# Patient Record
Sex: Female | Born: 1935
Health system: Southern US, Community
[De-identification: ages and names within clinical notes are randomized; demographics above are authoritative.]

## PROBLEM LIST (undated history)

## (undated) DIAGNOSIS — IMO0002 Reserved for concepts with insufficient information to code with codable children: Secondary | ICD-10-CM

## (undated) DIAGNOSIS — Z860100 Personal history of colon polyps, unspecified: Secondary | ICD-10-CM

## (undated) DIAGNOSIS — R238 Other skin changes: Secondary | ICD-10-CM

## (undated) DIAGNOSIS — H04123 Dry eye syndrome of bilateral lacrimal glands: Secondary | ICD-10-CM

## (undated) DIAGNOSIS — G8929 Other chronic pain: Secondary | ICD-10-CM

## (undated) DIAGNOSIS — M2550 Pain in unspecified joint: Secondary | ICD-10-CM

## (undated) DIAGNOSIS — M1990 Unspecified osteoarthritis, unspecified site: Secondary | ICD-10-CM

## (undated) DIAGNOSIS — R233 Spontaneous ecchymoses: Secondary | ICD-10-CM

## (undated) DIAGNOSIS — K222 Esophageal obstruction: Secondary | ICD-10-CM

## (undated) DIAGNOSIS — M62838 Other muscle spasm: Secondary | ICD-10-CM

## (undated) DIAGNOSIS — M545 Low back pain, unspecified: Secondary | ICD-10-CM

## (undated) DIAGNOSIS — K259 Gastric ulcer, unspecified as acute or chronic, without hemorrhage or perforation: Secondary | ICD-10-CM

## (undated) DIAGNOSIS — R1110 Vomiting, unspecified: Secondary | ICD-10-CM

## (undated) DIAGNOSIS — M2540 Effusion, unspecified joint: Secondary | ICD-10-CM

## (undated) DIAGNOSIS — Z9889 Other specified postprocedural states: Secondary | ICD-10-CM

## (undated) DIAGNOSIS — M810 Age-related osteoporosis without current pathological fracture: Secondary | ICD-10-CM

## (undated) DIAGNOSIS — Z8709 Personal history of other diseases of the respiratory system: Secondary | ICD-10-CM

## (undated) DIAGNOSIS — E785 Hyperlipidemia, unspecified: Secondary | ICD-10-CM

## (undated) DIAGNOSIS — I10 Essential (primary) hypertension: Secondary | ICD-10-CM

## (undated) DIAGNOSIS — C4491 Basal cell carcinoma of skin, unspecified: Secondary | ICD-10-CM

## (undated) DIAGNOSIS — K5730 Diverticulosis of large intestine without perforation or abscess without bleeding: Secondary | ICD-10-CM

## (undated) DIAGNOSIS — R3915 Urgency of urination: Secondary | ICD-10-CM

## (undated) DIAGNOSIS — F329 Major depressive disorder, single episode, unspecified: Secondary | ICD-10-CM

## (undated) DIAGNOSIS — R112 Nausea with vomiting, unspecified: Secondary | ICD-10-CM

## (undated) DIAGNOSIS — R251 Tremor, unspecified: Secondary | ICD-10-CM

## (undated) DIAGNOSIS — M1611 Unilateral primary osteoarthritis, right hip: Secondary | ICD-10-CM

## (undated) DIAGNOSIS — F32A Depression, unspecified: Secondary | ICD-10-CM

## (undated) DIAGNOSIS — Z9289 Personal history of other medical treatment: Secondary | ICD-10-CM

## (undated) DIAGNOSIS — K219 Gastro-esophageal reflux disease without esophagitis: Secondary | ICD-10-CM

## (undated) DIAGNOSIS — K648 Other hemorrhoids: Secondary | ICD-10-CM

## (undated) DIAGNOSIS — N3281 Overactive bladder: Secondary | ICD-10-CM

## (undated) DIAGNOSIS — I00 Rheumatic fever without heart involvement: Secondary | ICD-10-CM

## (undated) DIAGNOSIS — Z8719 Personal history of other diseases of the digestive system: Secondary | ICD-10-CM

## (undated) DIAGNOSIS — G250 Essential tremor: Secondary | ICD-10-CM

## (undated) DIAGNOSIS — M791 Myalgia, unspecified site: Secondary | ICD-10-CM

## (undated) HISTORY — DX: Personal history of colon polyps, unspecified: Z86.0100

## (undated) HISTORY — DX: Personal history of colonic polyps: Z86.010

## (undated) HISTORY — DX: Unspecified osteoarthritis, unspecified site: M19.90

## (undated) HISTORY — DX: Gastro-esophageal reflux disease without esophagitis: K21.9

## (undated) HISTORY — DX: Myalgia, unspecified site: M79.10

## (undated) HISTORY — DX: Essential tremor: G25.0

## (undated) HISTORY — DX: Other hemorrhoids: K64.8

## (undated) HISTORY — DX: Hemochromatosis, unspecified: E83.119

## (undated) HISTORY — PX: SKIN CANCER EXCISION: SHX779

## (undated) HISTORY — DX: Esophageal obstruction: K22.2

## (undated) HISTORY — DX: Hyperlipidemia, unspecified: E78.5

## (undated) HISTORY — DX: Essential (primary) hypertension: I10

## (undated) HISTORY — PX: ESOPHAGOGASTRODUODENOSCOPY: SHX1529

## (undated) HISTORY — PX: SHOULDER ARTHROSCOPY W/ ROTATOR CUFF REPAIR: SHX2400

## (undated) HISTORY — DX: Diverticulosis of large intestine without perforation or abscess without bleeding: K57.30

## (undated) HISTORY — PX: COLONOSCOPY: SHX174

---
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
## 1973-10-12 HISTORY — PX: DILATION AND CURETTAGE OF UTERUS: SHX78

## 1973-10-12 HISTORY — PX: TUBAL LIGATION: SHX77

## 1975-10-13 HISTORY — PX: CHOLECYSTECTOMY: SHX55

## 1985-10-12 HISTORY — DX: Hemochromatosis, unspecified: E83.119

## 1987-10-13 HISTORY — PX: LUMBAR DISC SURGERY: SHX700

## 1999-05-13 ENCOUNTER — Encounter (INDEPENDENT_AMBULATORY_CARE_PROVIDER_SITE_OTHER): Payer: Self-pay

## 1999-05-13 ENCOUNTER — Other Ambulatory Visit: Admission: RE | Admit: 1999-05-13 | Discharge: 1999-05-13 | Payer: Self-pay | Admitting: *Deleted

## 1999-07-21 ENCOUNTER — Other Ambulatory Visit: Admission: RE | Admit: 1999-07-21 | Discharge: 1999-07-21 | Payer: Self-pay | Admitting: Family Medicine

## 1999-10-13 HISTORY — PX: CATARACT EXTRACTION W/ INTRAOCULAR LENS  IMPLANT, BILATERAL: SHX1307

## 1999-11-24 ENCOUNTER — Other Ambulatory Visit: Admission: RE | Admit: 1999-11-24 | Discharge: 1999-11-24 | Payer: Self-pay | Admitting: Family Medicine

## 2000-06-07 ENCOUNTER — Other Ambulatory Visit: Admission: RE | Admit: 2000-06-07 | Discharge: 2000-06-07 | Payer: Self-pay | Admitting: Family Medicine

## 2000-06-21 ENCOUNTER — Ambulatory Visit (HOSPITAL_COMMUNITY): Admission: RE | Admit: 2000-06-21 | Discharge: 2000-06-21 | Payer: Self-pay | Admitting: Family Medicine

## 2000-06-21 ENCOUNTER — Encounter: Payer: Self-pay | Admitting: Family Medicine

## 2000-12-03 ENCOUNTER — Inpatient Hospital Stay (HOSPITAL_COMMUNITY): Admission: EM | Admit: 2000-12-03 | Discharge: 2000-12-05 | Payer: Self-pay | Admitting: Emergency Medicine

## 2001-01-17 ENCOUNTER — Other Ambulatory Visit: Admission: RE | Admit: 2001-01-17 | Discharge: 2001-01-17 | Payer: Self-pay | Admitting: Family Medicine

## 2001-09-16 ENCOUNTER — Encounter: Payer: Self-pay | Admitting: Family Medicine

## 2001-09-16 ENCOUNTER — Ambulatory Visit (HOSPITAL_COMMUNITY): Admission: RE | Admit: 2001-09-16 | Discharge: 2001-09-16 | Payer: Self-pay | Admitting: Family Medicine

## 2002-05-15 ENCOUNTER — Other Ambulatory Visit: Admission: RE | Admit: 2002-05-15 | Discharge: 2002-05-15 | Payer: Self-pay | Admitting: Family Medicine

## 2002-09-11 ENCOUNTER — Encounter: Payer: Self-pay | Admitting: *Deleted

## 2002-09-11 ENCOUNTER — Encounter: Admission: RE | Admit: 2002-09-11 | Discharge: 2002-09-11 | Payer: Self-pay | Admitting: *Deleted

## 2003-07-31 ENCOUNTER — Ambulatory Visit (HOSPITAL_COMMUNITY): Admission: RE | Admit: 2003-07-31 | Discharge: 2003-07-31 | Payer: Self-pay | Admitting: Orthopedic Surgery

## 2003-07-31 ENCOUNTER — Encounter: Payer: Self-pay | Admitting: Orthopedic Surgery

## 2003-09-24 ENCOUNTER — Encounter: Admission: RE | Admit: 2003-09-24 | Discharge: 2003-09-24 | Payer: Self-pay | Admitting: Orthopedic Surgery

## 2003-09-25 ENCOUNTER — Ambulatory Visit (HOSPITAL_BASED_OUTPATIENT_CLINIC_OR_DEPARTMENT_OTHER): Admission: RE | Admit: 2003-09-25 | Discharge: 2003-09-25 | Payer: Self-pay | Admitting: Orthopedic Surgery

## 2003-09-25 ENCOUNTER — Ambulatory Visit (HOSPITAL_COMMUNITY): Admission: RE | Admit: 2003-09-25 | Discharge: 2003-09-25 | Payer: Self-pay | Admitting: Orthopedic Surgery

## 2004-06-10 ENCOUNTER — Other Ambulatory Visit: Admission: RE | Admit: 2004-06-10 | Discharge: 2004-06-10 | Payer: Self-pay | Admitting: Family Medicine

## 2004-09-23 ENCOUNTER — Ambulatory Visit: Payer: Self-pay | Admitting: Oncology

## 2004-11-18 ENCOUNTER — Ambulatory Visit (HOSPITAL_COMMUNITY): Admission: RE | Admit: 2004-11-18 | Discharge: 2004-11-18 | Payer: Self-pay | Admitting: Gastroenterology

## 2004-11-18 ENCOUNTER — Encounter (INDEPENDENT_AMBULATORY_CARE_PROVIDER_SITE_OTHER): Payer: Self-pay | Admitting: Specialist

## 2004-11-24 ENCOUNTER — Ambulatory Visit: Payer: Self-pay | Admitting: Oncology

## 2004-11-26 ENCOUNTER — Inpatient Hospital Stay (HOSPITAL_COMMUNITY): Admission: EM | Admit: 2004-11-26 | Discharge: 2004-11-29 | Payer: Self-pay | Admitting: Emergency Medicine

## 2005-03-20 ENCOUNTER — Ambulatory Visit: Payer: Self-pay | Admitting: Oncology

## 2005-05-22 ENCOUNTER — Ambulatory Visit: Payer: Self-pay | Admitting: Oncology

## 2005-11-23 ENCOUNTER — Ambulatory Visit: Payer: Self-pay | Admitting: Oncology

## 2006-05-20 ENCOUNTER — Ambulatory Visit: Payer: Self-pay | Admitting: Oncology

## 2006-05-24 LAB — CBC WITH DIFFERENTIAL/PLATELET
BASO%: 0.4 % (ref 0.0–2.0)
EOS%: 2.6 % (ref 0.0–7.0)
HCT: 39.6 % (ref 34.8–46.6)
LYMPH%: 39 % (ref 14.0–48.0)
MCH: 33.5 pg (ref 26.0–34.0)
MCHC: 34.8 g/dL (ref 32.0–36.0)
MCV: 96.2 fL (ref 81.0–101.0)
MONO%: 9.7 % (ref 0.0–13.0)
NEUT%: 48.3 % (ref 39.6–76.8)
Platelets: 301 10*3/uL (ref 145–400)
RBC: 4.11 10*6/uL (ref 3.70–5.32)
WBC: 7 10*3/uL (ref 3.9–10.0)

## 2006-05-24 LAB — FERRITIN: Ferritin: 118 ng/mL (ref 10–291)

## 2006-06-08 ENCOUNTER — Encounter: Admission: RE | Admit: 2006-06-08 | Discharge: 2006-06-08 | Payer: Self-pay | Admitting: Gastroenterology

## 2006-11-17 ENCOUNTER — Ambulatory Visit: Payer: Self-pay | Admitting: Oncology

## 2006-11-22 LAB — CBC WITH DIFFERENTIAL/PLATELET
BASO%: 0.4 % (ref 0.0–2.0)
EOS%: 2.2 % (ref 0.0–7.0)
HCT: 39.7 % (ref 34.8–46.6)
MCH: 33.4 pg (ref 26.0–34.0)
MCHC: 35.4 g/dL (ref 32.0–36.0)
MCV: 94.5 fL (ref 81.0–101.0)
MONO%: 5.9 % (ref 0.0–13.0)
NEUT%: 55.2 % (ref 39.6–76.8)
RDW: 12.5 % (ref 11.3–14.5)
lymph#: 2.8 10*3/uL (ref 0.9–3.3)

## 2006-11-23 LAB — COMPREHENSIVE METABOLIC PANEL
ALT: 26 U/L (ref 0–35)
AST: 35 U/L (ref 0–37)
Alkaline Phosphatase: 62 U/L (ref 39–117)
BUN: 17 mg/dL (ref 6–23)
Calcium: 9.3 mg/dL (ref 8.4–10.5)
Chloride: 104 mEq/L (ref 96–112)
Creatinine, Ser: 0.82 mg/dL (ref 0.40–1.20)

## 2007-02-18 ENCOUNTER — Ambulatory Visit: Payer: Self-pay | Admitting: Oncology

## 2007-05-19 ENCOUNTER — Ambulatory Visit: Payer: Self-pay | Admitting: Oncology

## 2007-05-23 LAB — CBC WITH DIFFERENTIAL/PLATELET
Basophils Absolute: 0 10*3/uL (ref 0.0–0.1)
Eosinophils Absolute: 0.2 10*3/uL (ref 0.0–0.5)
HCT: 38.2 % (ref 34.8–46.6)
HGB: 13.6 g/dL (ref 11.6–15.9)
LYMPH%: 33.2 % (ref 14.0–48.0)
MCV: 94.5 fL (ref 81.0–101.0)
MONO#: 0.6 10*3/uL (ref 0.1–0.9)
MONO%: 8.6 % (ref 0.0–13.0)
NEUT#: 4.1 10*3/uL (ref 1.5–6.5)
NEUT%: 55.1 % (ref 39.6–76.8)
Platelets: 273 10*3/uL (ref 145–400)
WBC: 7.4 10*3/uL (ref 3.9–10.0)

## 2007-08-25 ENCOUNTER — Ambulatory Visit: Payer: Self-pay | Admitting: Oncology

## 2007-08-30 LAB — CBC WITH DIFFERENTIAL/PLATELET
Eosinophils Absolute: 0.2 10*3/uL (ref 0.0–0.5)
HGB: 14.6 g/dL (ref 11.6–15.9)
MONO#: 0.5 10*3/uL (ref 0.1–0.9)
NEUT#: 4.5 10*3/uL (ref 1.5–6.5)
Platelets: 274 10*3/uL (ref 145–400)
RBC: 4.35 10*6/uL (ref 3.70–5.32)
RDW: 12.2 % (ref 11.3–14.5)
WBC: 7.7 10*3/uL (ref 3.9–10.0)

## 2007-08-30 LAB — FERRITIN: Ferritin: 66 ng/mL (ref 10–291)

## 2007-09-13 LAB — CBC WITH DIFFERENTIAL/PLATELET
Basophils Absolute: 0 10*3/uL (ref 0.0–0.1)
Eosinophils Absolute: 0.3 10*3/uL (ref 0.0–0.5)
HCT: 38.8 % (ref 34.8–46.6)
HGB: 13.7 g/dL (ref 11.6–15.9)
MONO#: 0.8 10*3/uL (ref 0.1–0.9)
NEUT%: 56.1 % (ref 39.6–76.8)
WBC: 8.6 10*3/uL (ref 3.9–10.0)
lymph#: 2.7 10*3/uL (ref 0.9–3.3)

## 2007-11-17 ENCOUNTER — Ambulatory Visit: Payer: Self-pay | Admitting: Oncology

## 2007-11-21 LAB — COMPREHENSIVE METABOLIC PANEL
ALT: 14 U/L (ref 0–35)
AST: 21 U/L (ref 0–37)
Albumin: 4.1 g/dL (ref 3.5–5.2)
Alkaline Phosphatase: 57 U/L (ref 39–117)
Glucose, Bld: 122 mg/dL — ABNORMAL HIGH (ref 70–99)
Potassium: 4.4 mEq/L (ref 3.5–5.3)
Sodium: 144 mEq/L (ref 135–145)
Total Protein: 6.6 g/dL (ref 6.0–8.3)

## 2007-11-21 LAB — CBC WITH DIFFERENTIAL/PLATELET
BASO%: 0.3 % (ref 0.0–2.0)
Eosinophils Absolute: 0.2 10*3/uL (ref 0.0–0.5)
MCHC: 35 g/dL (ref 32.0–36.0)
MCV: 95.4 fL (ref 81.0–101.0)
MONO%: 9.1 % (ref 0.0–13.0)
NEUT#: 4 10*3/uL (ref 1.5–6.5)
RBC: 3.95 10*6/uL (ref 3.70–5.32)
RDW: 12.2 % (ref 11.3–14.5)
WBC: 7.3 10*3/uL (ref 3.9–10.0)

## 2008-03-21 ENCOUNTER — Ambulatory Visit: Payer: Self-pay | Admitting: Oncology

## 2008-05-03 ENCOUNTER — Ambulatory Visit: Payer: Self-pay | Admitting: Oncology

## 2008-05-07 LAB — FERRITIN: Ferritin: 101 ng/mL (ref 10–291)

## 2008-06-13 ENCOUNTER — Ambulatory Visit: Payer: Self-pay | Admitting: Oncology

## 2008-06-19 LAB — FERRITIN: Ferritin: 92 ng/mL (ref 10–291)

## 2008-08-02 ENCOUNTER — Ambulatory Visit: Payer: Self-pay | Admitting: Oncology

## 2008-08-06 LAB — FERRITIN: Ferritin: 44 ng/mL (ref 10–291)

## 2008-11-08 ENCOUNTER — Ambulatory Visit: Payer: Self-pay | Admitting: Oncology

## 2008-11-12 LAB — CBC WITH DIFFERENTIAL/PLATELET
BASO%: 0.3 % (ref 0.0–2.0)
Eosinophils Absolute: 0.2 10*3/uL (ref 0.0–0.5)
LYMPH%: 29.8 % (ref 14.0–48.0)
MCHC: 34.7 g/dL (ref 32.0–36.0)
MCV: 96 fL (ref 81.0–101.0)
MONO%: 6.9 % (ref 0.0–13.0)
NEUT#: 5.3 10*3/uL (ref 1.5–6.5)
RBC: 4.09 10*6/uL (ref 3.70–5.32)
RDW: 12.7 % (ref 11.3–14.5)
WBC: 8.6 10*3/uL (ref 3.9–10.0)

## 2008-11-12 LAB — COMPREHENSIVE METABOLIC PANEL
ALT: 11 U/L (ref 0–35)
AST: 16 U/L (ref 0–37)
Albumin: 4.4 g/dL (ref 3.5–5.2)
Alkaline Phosphatase: 60 U/L (ref 39–117)
Glucose, Bld: 112 mg/dL — ABNORMAL HIGH (ref 70–99)
Potassium: 4 mEq/L (ref 3.5–5.3)
Sodium: 141 mEq/L (ref 135–145)
Total Bilirubin: 0.5 mg/dL (ref 0.3–1.2)
Total Protein: 6.6 g/dL (ref 6.0–8.3)

## 2008-12-03 LAB — FERRITIN: Ferritin: 61 ng/mL (ref 10–291)

## 2008-12-03 LAB — CBC WITH DIFFERENTIAL/PLATELET
BASO%: 0.1 % (ref 0.0–2.0)
EOS%: 1.7 % (ref 0.0–7.0)
HCT: 38.6 % (ref 34.8–46.6)
LYMPH%: 29.7 % (ref 14.0–49.7)
MCH: 33.8 pg (ref 25.1–34.0)
MCHC: 34.5 g/dL (ref 31.5–36.0)
MCV: 98 fL (ref 79.5–101.0)
MONO#: 0.7 10*3/uL (ref 0.1–0.9)
MONO%: 8.9 % (ref 0.0–14.0)
NEUT%: 59.6 % (ref 38.4–76.8)
Platelets: 293 10*3/uL (ref 145–400)
RBC: 3.94 10*6/uL (ref 3.70–5.45)
WBC: 7.9 10*3/uL (ref 3.9–10.3)

## 2008-12-03 LAB — IRON AND TIBC: TIBC: 289 ug/dL (ref 250–470)

## 2009-01-31 ENCOUNTER — Ambulatory Visit: Payer: Self-pay | Admitting: Oncology

## 2009-04-04 ENCOUNTER — Ambulatory Visit: Payer: Self-pay | Admitting: Oncology

## 2009-04-08 LAB — FERRITIN: Ferritin: 51 ng/mL (ref 10–291)

## 2009-06-06 ENCOUNTER — Ambulatory Visit: Payer: Self-pay | Admitting: Oncology

## 2009-06-10 LAB — FERRITIN: Ferritin: 47 ng/mL (ref 10–291)

## 2009-08-01 ENCOUNTER — Ambulatory Visit: Payer: Self-pay | Admitting: Oncology

## 2009-08-05 LAB — FERRITIN: Ferritin: 68 ng/mL (ref 10–291)

## 2009-10-02 ENCOUNTER — Ambulatory Visit: Payer: Self-pay | Admitting: Oncology

## 2009-10-29 DIAGNOSIS — G250 Essential tremor: Secondary | ICD-10-CM

## 2009-10-29 HISTORY — DX: Essential tremor: G25.0

## 2009-11-18 ENCOUNTER — Encounter: Admission: RE | Admit: 2009-11-18 | Discharge: 2009-11-18 | Payer: Self-pay | Admitting: Gastroenterology

## 2009-11-20 DIAGNOSIS — K648 Other hemorrhoids: Secondary | ICD-10-CM

## 2009-11-20 DIAGNOSIS — K5730 Diverticulosis of large intestine without perforation or abscess without bleeding: Secondary | ICD-10-CM

## 2009-11-20 HISTORY — DX: Other hemorrhoids: K64.8

## 2009-11-20 HISTORY — DX: Diverticulosis of large intestine without perforation or abscess without bleeding: K57.30

## 2009-11-20 LAB — HM COLONOSCOPY

## 2009-11-21 ENCOUNTER — Ambulatory Visit: Payer: Self-pay | Admitting: Oncology

## 2009-11-25 LAB — CBC WITH DIFFERENTIAL/PLATELET
BASO%: 0.3 % (ref 0.0–2.0)
Basophils Absolute: 0 10*3/uL (ref 0.0–0.1)
HCT: 38.7 % (ref 34.8–46.6)
LYMPH%: 44.9 % (ref 14.0–49.7)
MCHC: 34.2 g/dL (ref 31.5–36.0)
MONO#: 0.5 10*3/uL (ref 0.1–0.9)
NEUT%: 44.2 % (ref 38.4–76.8)
Platelets: 282 10*3/uL (ref 145–400)
WBC: 6.6 10*3/uL (ref 3.9–10.3)

## 2009-11-25 LAB — FERRITIN: Ferritin: 96 ng/mL (ref 10–291)

## 2009-11-25 LAB — IRON AND TIBC: %SAT: 46 % (ref 20–55)

## 2009-12-16 LAB — CBC WITH DIFFERENTIAL/PLATELET
Basophils Absolute: 0 10*3/uL (ref 0.0–0.1)
Eosinophils Absolute: 0.2 10*3/uL (ref 0.0–0.5)
HGB: 12.2 g/dL (ref 11.6–15.9)
LYMPH%: 35.7 % (ref 14.0–49.7)
MCV: 100 fL (ref 79.5–101.0)
MONO%: 8.8 % (ref 0.0–14.0)
NEUT#: 3.6 10*3/uL (ref 1.5–6.5)
Platelets: 289 10*3/uL (ref 145–400)
RDW: 12.9 % (ref 11.2–14.5)

## 2010-02-21 ENCOUNTER — Ambulatory Visit: Payer: Self-pay | Admitting: Oncology

## 2010-02-24 LAB — CBC WITH DIFFERENTIAL/PLATELET
Basophils Absolute: 0 10*3/uL (ref 0.0–0.1)
Eosinophils Absolute: 0.1 10*3/uL (ref 0.0–0.5)
HCT: 38.7 % (ref 34.8–46.6)
HGB: 13.4 g/dL (ref 11.6–15.9)
MONO#: 0.6 10*3/uL (ref 0.1–0.9)
NEUT%: 52 % (ref 38.4–76.8)
WBC: 6.1 10*3/uL (ref 3.9–10.3)
lymph#: 2.3 10*3/uL (ref 0.9–3.3)

## 2010-04-24 ENCOUNTER — Ambulatory Visit: Payer: Self-pay | Admitting: Oncology

## 2010-04-28 LAB — CBC WITH DIFFERENTIAL/PLATELET
Eosinophils Absolute: 0.2 10*3/uL (ref 0.0–0.5)
HGB: 13.9 g/dL (ref 11.6–15.9)
LYMPH%: 30.2 % (ref 14.0–49.7)
MONO#: 0.7 10*3/uL (ref 0.1–0.9)
NEUT#: 4.3 10*3/uL (ref 1.5–6.5)
Platelets: 288 10*3/uL (ref 145–400)
RBC: 4.1 10*6/uL (ref 3.70–5.45)
RDW: 12.5 % (ref 11.2–14.5)
WBC: 7.4 10*3/uL (ref 3.9–10.3)

## 2010-04-28 LAB — FERRITIN: Ferritin: 59 ng/mL (ref 10–291)

## 2010-06-26 ENCOUNTER — Ambulatory Visit: Payer: Self-pay | Admitting: Oncology

## 2010-08-28 ENCOUNTER — Ambulatory Visit: Payer: Self-pay | Admitting: Oncology

## 2010-09-01 LAB — CBC WITH DIFFERENTIAL/PLATELET
Basophils Absolute: 0 10*3/uL (ref 0.0–0.1)
Eosinophils Absolute: 0.1 10*3/uL (ref 0.0–0.5)
HGB: 13 g/dL (ref 11.6–15.9)
MCV: 95.8 fL (ref 79.5–101.0)
MONO#: 0.6 10*3/uL (ref 0.1–0.9)
NEUT#: 4.3 10*3/uL (ref 1.5–6.5)
RDW: 12.8 % (ref 11.2–14.5)
WBC: 7.7 10*3/uL (ref 3.9–10.3)
lymph#: 2.6 10*3/uL (ref 0.9–3.3)

## 2010-09-01 LAB — FERRITIN: Ferritin: 22 ng/mL (ref 10–291)

## 2010-11-25 ENCOUNTER — Other Ambulatory Visit: Payer: Self-pay | Admitting: Oncology

## 2010-11-25 ENCOUNTER — Encounter (HOSPITAL_BASED_OUTPATIENT_CLINIC_OR_DEPARTMENT_OTHER): Payer: Medicare Other | Admitting: Oncology

## 2010-11-25 LAB — CBC WITH DIFFERENTIAL/PLATELET
Basophils Absolute: 0 10*3/uL (ref 0.0–0.1)
EOS%: 1.7 % (ref 0.0–7.0)
HGB: 12.8 g/dL (ref 11.6–15.9)
MCH: 32.7 pg (ref 25.1–34.0)
MONO#: 0.7 10*3/uL (ref 0.1–0.9)
NEUT#: 4.7 10*3/uL (ref 1.5–6.5)
RDW: 12.4 % (ref 11.2–14.5)
WBC: 8 10*3/uL (ref 3.9–10.3)
lymph#: 2.4 10*3/uL (ref 0.9–3.3)

## 2010-12-02 ENCOUNTER — Encounter (HOSPITAL_BASED_OUTPATIENT_CLINIC_OR_DEPARTMENT_OTHER): Payer: Medicare Other | Admitting: Oncology

## 2011-02-27 NOTE — H&P (Signed)
NAMECARRIEANNE, Denise Lane            ACCOUNT NO.:  [id number]   MEDICAL RECORD NO.:  [id number]          PATIENT TYPE:  INP   LOCATION:  6733                         FACILITY:  [facility]   PHYSICIAN:  Graylin Shiver, M.D.   DATE OF BIRTH:  May 03, 1930   DATE OF ADMISSION:  11/26/2004  DATE OF DISCHARGE:                                HISTORY & PHYSICAL   CHIEF COMPLAINT:  Rectal bleeding.   HISTORY OF PRESENT ILLNESS:  This patient is a 77 year old white female who  had a colonoscopy with polypectomy on November 18, 2004 with removal of a  small 5 mm sessile polyp from the ascending colon.  She was also found to  have diverticulosis.  She had been doing well until this morning at around 6  a.m. when she experienced bloody stool.  She states that she took some senna  laxative last night before going to bed, and then  had the bloody stool this  morning.  She was previously having normal bowel movements.  She came to the  emergency room where she was seen.  A hemoglobin and hematocrit were  obtained, and were 11.5 and 33 respectively.   PAST HISTORY:   MEDICAL PROBLEMS:  Hemachromatosis.   SURGERIES:  1.  Tonsillectomy.  2.  Appendectomy.  3.  Laparoscopy.  4.  Cholecystectomy.  5.  Back surgery.  6.  Cataract surgery.  7.  Rotator cuff and shoulder surgery.  8.  History of a Mallory-Weiss tear in the past found at endoscopy several      years ago.   MEDICATIONS:  1.  Fosamax.  2.  Prilosec.  3.  Multivitamin.  4.  Fish oil.  5.  Magnesium with zinc.  6.  B complex.  7.  Folic acid.  8.  Glucosamine.  9.  MiraLax.   ALLERGIES:  1.  MORPHINE.  2.  DILAUDID.  3.  CODEINE.  4.  SULFA.   FAMILY HISTORY:  Negative for colon cancer or colon polyps.   SOCIAL HISTORY:  She does not smoke or drink alcohol.   SYSTEMS REVIEW:  No complaints of fever or chills.  No chest pain.  No  shortness of breath, cough, or sputum production.  No abdominal pain.  Further GI history as  above.   PHYSICAL EXAMINATION:  GENERAL:  She does not appear in any acute distress  at this time.  VITAL SIGNS:  Blood pressure 106/53, pulse 79.  HEENT:  Negative.  NECK:  Supple.  No masses, adenopathy, or goiter.  HEART:  Regular rhythm.  No murmurs, gallops, or rubs.  LUNGS:  Clear.  ABDOMEN:  Bowel sounds are normal, soft, nontender.  No hepatosplenomegaly.  EXTREMITIES:  No edema.   IMPRESSION:  Lower gastrointestinal bleeding.  This could be a post-  polypectomy bleed, or it could be bleeding from diverticulosis.   PLAN:  Admit to the hospital for IV hydration, serial hemoglobin and  hematocrits, transfusion as needed.  A nuclear medicine GI bleeding scan  will be obtained.      SFG/MEDQ  D:  11/26/2004  T:  11/26/2004  Job:  [id number] 

## 2011-02-27 NOTE — Discharge Summary (Signed)
Denise Lane, Denise Lane            ACCOUNT NO.:  [id number]   MEDICAL RECORD NO.:  [id number]          PATIENT TYPE:  INP   LOCATION:  6733                         FACILITY:  [facility]   PHYSICIAN:  Graylin Shiver, M.D.   DATE OF BIRTH:  08-24-1930   DATE OF ADMISSION:  11/26/2004  DATE OF DISCHARGE:  11/29/2004                                 DISCHARGE SUMMARY   REASON FOR ADMISSION:  The patient is a 77 year old female who had a  colonoscopy with polypectomy on November 18, 2004 with removal of a 5 mm  sessile polyp from the ascending colon.  She was also found to have  diverticulosis.  She was doing well until the morning of admission when she  began to experience bloody stools.  She was admitted to the hospital because  of the lower gastrointestinal bleeding.  It was felt that this could be a  postpolypectomy bleed or bleeding from diverticulosis.   PHYSICAL EXAMINATION:  VITAL SIGNS:  On physical, her blood pressure was  106/53, pulse 79.  GENERAL:  She did not appear in any acute distress.  HEART:  Regular rhythm.  No murmurs.  LUNGS:  Clear.  ABDOMEN:  Soft, nontender.   HOSPITAL COURSE:  The patient's admitting hemoglobin was 11.5.  The  following day, it did drop down to 7.9.  She was transfused two units of  blood.  A gastrointestinal bleeding scan was obtained and was negative.  She  was followed clinically.  There was no further evidence of active bleeding.  She was advanced to a regular diet and discharged on November 29, 2004.   FINAL DIAGNOSIS:  1.  Lower gastrointestinal bleeding.  2.  Diverticulosis.  3.  Status post polypectomy.   PLAN:  Regular diet.  Follow up in the office post discharge with checking  of hemoglobin and hematocrit in a couple of weeks.      SFG/MEDQ  D:  02/04/2005  T:  02/04/2005  Job:  [id number]

## 2011-02-27 NOTE — Op Note (Signed)
NAME:  Denise Lane, Denise Lane            ACCOUNT NO.:  [id number]   MEDICAL RECORD NO.:  [id number]          PATIENT TYPE:  AMB   LOCATION:  ENDO                         FACILITY:  [facility]   PHYSICIAN:  Graylin Shiver, M.D.   DATE OF BIRTH:  1930/05/12   DATE OF PROCEDURE:  11/18/2004  DATE OF DISCHARGE:                                 OPERATIVE REPORT   PROCEDURE PERFORMED:  Colonoscopy with polypectomy.   INDICATIONS:  Change in bowel habits.   Informed consent was obtained after explanation of the risks of bleeding,  infection and perforation.   PREMEDICATION:  Fentanyl 60 mcg IV, Versed 6 milligrams IV.   PROCEDURE:  With the patient in the left lateral decubitus position, a  rectal exam was performed. No masses were felt. The Olympus colonoscope was  inserted into the rectum and advanced around the colon to the cecum. Cecal  landmarks were identified. The cecum looked normal.  In the descending colon  there was a 5 mm sessile polyp snared with a mini snare and removed by snare  cautery technique.  The cautery site looked good.  The polyp was retrieved  in fragments as it was suctioned through the scope.  The transverse colon  looked normal. The descending colon looked normal. The sigmoid showed a few  diverticula.  The rectum was normal. She tolerated the procedure well  without complications.   IMPRESSION:  1.  A small ascending colon polyp.  2.  Diverticulosis.   PLAN:  The pathology will be checked.      SFG/MEDQ  D:  11/18/2004  T:  11/18/2004  Job:  [id number]   cc:   Dario Guardian, M.D.  [address]  [place]  [place] [address]  Fax: [phone]

## 2011-02-27 NOTE — Procedures (Signed)
[place]  Patient:    Denise Lane, Denise Lane                   MRN: [id number] Proc. Date: 12/03/00 Adm. Date:  [id number] Disc. Date: [id number] Attending:  Selina Cooley CC:         Selina Cooley, M.D.  Willis Modena. Dreiling, M.D.   Procedure Report  PROCEDURE PERFORMED:  Esophagogastroduodenoscopy.  ENDOSCOPIST:  Everardo All. Madilyn Fireman, M.D.  INDICATIONS FOR PROCEDURE:  Upper gastrointestinal bleeding.  DESCRIPTION OF PROCEDURE:  The patient was placed in the left lateral decubitus position and placed on the pulse monitor with continuous low flow oxygen delivered by nasal cannula.  She was sedated with 30 mg IV Demerol and 4 mg IV Versed.  The Olympus video endoscope was advanced under direct vision into the oropharynx and esophagus.  The esophagus was straight and normal caliber at the squamocolumnar line at 38 cm and initially the gastroesophageal junction area appeared normal, but on careful inspection, there appeared to be a slightly protuberant visible vessel possibly associated with a Mallory-Weiss tear at the squamocolumnar line.  It was somewhat difficult to visualize clearly though was not bleeding.  There was a small hiatal hernia distal to it.  The stomach was entered and a small amount of liquid secretions was suctioned from the fundus.  Retroflex view of the cardia was unremarkable. The fundus, body, antrum and pylorus all appeared normal.  The duodenum was entered and both the bulb and second portion were well inspected and appeared to be within normal limits.  The endoscope was withdrawn and the patient returned to the recovery room in stable condition.  The patient tolerated the procedure well.  There were no immediate complications.  IMPRESSION:  Presumed Mallory-Weiss tear currently not bleeding.  No other sources of gastrointestinal bleeding seen.  PLAN:  Will treat with Carafate elixir and proton pump inhibitor.  Will monitor for  further bleeding and advance diet gradually. DD:  12/04/00 TD:  12/06/00 Job: [id number]

## 2011-05-26 ENCOUNTER — Other Ambulatory Visit: Payer: Self-pay | Admitting: Dermatology

## 2011-06-02 ENCOUNTER — Other Ambulatory Visit: Payer: Self-pay | Admitting: Oncology

## 2011-06-02 ENCOUNTER — Encounter (HOSPITAL_BASED_OUTPATIENT_CLINIC_OR_DEPARTMENT_OTHER): Payer: Medicare Other | Admitting: Oncology

## 2011-06-02 LAB — CBC WITH DIFFERENTIAL/PLATELET
Eosinophils Absolute: 0.2 10*3/uL (ref 0.0–0.5)
HCT: 38.7 % (ref 34.8–46.6)
HGB: 13.5 g/dL (ref 11.6–15.9)
LYMPH%: 33 % (ref 14.0–49.7)
MONO#: 0.7 10*3/uL (ref 0.1–0.9)
NEUT#: 4 10*3/uL (ref 1.5–6.5)
NEUT%: 55.1 % (ref 38.4–76.8)
Platelets: 253 10*3/uL (ref 145–400)
WBC: 7.3 10*3/uL (ref 3.9–10.3)
lymph#: 2.4 10*3/uL (ref 0.9–3.3)

## 2011-06-02 LAB — FERRITIN: Ferritin: 49 ng/mL (ref 10–291)

## 2011-10-24 ENCOUNTER — Telehealth: Payer: Self-pay | Admitting: Oncology

## 2011-10-24 NOTE — Telephone Encounter (Signed)
S/w the pt and she is aware of her feb 2013 appts °

## 2011-11-09 DIAGNOSIS — Z1231 Encounter for screening mammogram for malignant neoplasm of breast: Secondary | ICD-10-CM | POA: Diagnosis not present

## 2011-11-11 ENCOUNTER — Other Ambulatory Visit: Payer: Medicare Other | Admitting: Lab

## 2011-11-17 ENCOUNTER — Other Ambulatory Visit: Payer: Medicare Other

## 2011-11-17 LAB — CBC WITH DIFFERENTIAL/PLATELET
Basophils Absolute: 0 10*3/uL (ref 0.0–0.1)
EOS%: 2.5 % (ref 0.0–7.0)
Eosinophils Absolute: 0.2 10*3/uL (ref 0.0–0.5)
LYMPH%: 35.7 % (ref 14.0–49.7)
MCH: 33.5 pg (ref 25.1–34.0)
MCV: 98.5 fL (ref 79.5–101.0)
MONO%: 8 % (ref 0.0–14.0)
NEUT#: 4 10*3/uL (ref 1.5–6.5)
Platelets: 269 10*3/uL (ref 145–400)
RBC: 4.01 10*6/uL (ref 3.70–5.45)

## 2011-11-17 LAB — COMPREHENSIVE METABOLIC PANEL
Alkaline Phosphatase: 69 U/L (ref 39–117)
BUN: 24 mg/dL — ABNORMAL HIGH (ref 6–23)
Glucose, Bld: 130 mg/dL — ABNORMAL HIGH (ref 70–99)
Sodium: 140 mEq/L (ref 135–145)
Total Bilirubin: 0.3 mg/dL (ref 0.3–1.2)

## 2011-11-18 ENCOUNTER — Other Ambulatory Visit: Payer: Self-pay | Admitting: *Deleted

## 2011-11-18 ENCOUNTER — Telehealth: Payer: Self-pay

## 2011-11-18 NOTE — Telephone Encounter (Signed)
SPOKE WITH MS. [name] AND TOLD HER THAT HER FERRITIN LEVEL FROM 11-17-11 WAS 57.  SHE WILL HAVE A PHLEBOTOMY AFTER HER VISIT ON 11-23-11.  PT. VERBALIZED UNDERSTANDING.

## 2011-11-20 ENCOUNTER — Ambulatory Visit: Payer: Medicare Other | Admitting: Oncology

## 2011-11-21 ENCOUNTER — Other Ambulatory Visit: Payer: Self-pay | Admitting: Oncology

## 2011-11-23 ENCOUNTER — Telehealth: Payer: Self-pay | Admitting: Oncology

## 2011-11-23 ENCOUNTER — Ambulatory Visit (HOSPITAL_BASED_OUTPATIENT_CLINIC_OR_DEPARTMENT_OTHER): Payer: Medicare Other | Admitting: Oncology

## 2011-11-23 NOTE — Patient Instructions (Signed)
Drink lots of fluids while sinus congestion and before phlebotomy.

## 2011-11-23 NOTE — Progress Notes (Signed)
OFFICE PROGRESS NOTE Date of Visit 11-23-11 Physicians: A.Green, J.Edwards  INTERVAL HISTORY:   Patient is seen, alone for visit today, in yearly follow up of her hemachromatosis, which we treat with prn phlebotomy to keep ferritin <=50. Most recent ferritin was 57 on 11-17-11 and she was scheduled for phlebotomy with visit today, however she has sinusitis vs viral URI and we will delay phlebotomy for a couple of weeks until she has resolved this.  Patient has been generally well until this past weekend, when she developed sinusitis/ URI symptoms after her brother's funeral. She has had sinus congestion and drainage, no fever, no lower respiratory symptoms, some discomfort in right ear.  Review of Systems otherwise: no bleeding or symptoms of blood clots. 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 No other recent infectious illness. No abdominal pain, good energy, no GI symptoms. Remainder of 10 point ROS negative. Objective:  Vital signs in last 24 hours:  BP 164/75  Pulse 84  Temp(Src) 97.3 F (36.3 C) (Oral)  Ht 5\' 6"  (1.676 m)  Wt 125 lb 1.6 oz (56.745 kg)  BMI 20.19 kg/m2 Alert, NAD, sounds congested.    HEENT:mucous membranes moist, pharynx normal without lesions. Nasal turbinates boggy. Right TM dull, left less so. LymphaticsCervical, supraclavicular, and axillary nodes normal. Resp: clear to auscultation bilaterally and normal percussion bilaterally Cardio: slightly irregular rate and rhythmn GI: soft, non-tender; bowel sounds normal; no masses,  no organomegaly Extremities: extremities normal, atraumatic, no cyanosis or edema. Some arthritic changes fingers bilaterally    Lab Results: from [facility] 08-03-11 WBC 7.5, Hgb13.4, MCV 97, plt 269, ANC 4.7, differential not remarkable  and BMET 08-03-11 Na 141,K 4.6, Cl 106, CO2 26, BUN 22, creat 0.7   Studies/Results:  No results found.  Medications: I have reviewed the  patient's current medications.  Assessment/Plan:  1.hemachromatosis: phlebotomize to keep ferritin < 50. Will do phlebotomy early March, check ferritin in ~ Sept 2013 and I will see her after labs ~ March 2014. 2.viral URI/sinusitis: push fluids, can use mucinex, call if symptoms worse        LIVESAY,LENNIS P, MD   11/23/2011, 9:07 PM

## 2011-11-23 NOTE — Telephone Encounter (Signed)
Talked to pt, she is aware of appt on 2/26 and 12/15/11

## 2011-11-30 NOTE — Progress Notes (Signed)
GAVE MELISSA DALE POF DONE FOR MS. Wulf  ON 11-23-11 AND TOLD HER THAT THERE WERE INCORRECT DATES SCHEDULED FOR PATIENT.  MELISSA STATED THAT SHE WILL FIX SCHEDULE.  THE COMMENTS FOR THE ORDERS DONE BY DR. Darrold Span ARE FINE. IT WAS A SCHEDULING ERROR.

## 2011-12-02 ENCOUNTER — Telehealth: Payer: Self-pay | Admitting: Oncology

## 2011-12-02 NOTE — Telephone Encounter (Signed)
cx'd appts for 2/26 lb and 3/5 f/u w/LL. appts made in error per LL (louise) these are not needed. Keep appts lb appt for sept 2013 and lb/fu for march 2014 and add phelb for 12/20/2012. Could not schedule phleb at this time because template has not been release. Message sent to SV to release template. S/w pt today she is aware of above changes and will get new schedule when she comes in for phleb 12/15/2011.

## 2011-12-07 ENCOUNTER — Encounter: Payer: Self-pay | Admitting: Oncology

## 2011-12-07 DIAGNOSIS — E785 Hyperlipidemia, unspecified: Secondary | ICD-10-CM | POA: Diagnosis not present

## 2011-12-07 DIAGNOSIS — I10 Essential (primary) hypertension: Secondary | ICD-10-CM | POA: Diagnosis not present

## 2011-12-07 DIAGNOSIS — R7989 Other specified abnormal findings of blood chemistry: Secondary | ICD-10-CM | POA: Diagnosis not present

## 2011-12-08 ENCOUNTER — Other Ambulatory Visit: Payer: Medicare Other | Admitting: Lab

## 2011-12-09 DIAGNOSIS — G252 Other specified forms of tremor: Secondary | ICD-10-CM | POA: Diagnosis not present

## 2011-12-09 DIAGNOSIS — R32 Unspecified urinary incontinence: Secondary | ICD-10-CM | POA: Diagnosis not present

## 2011-12-09 DIAGNOSIS — I10 Essential (primary) hypertension: Secondary | ICD-10-CM | POA: Diagnosis not present

## 2011-12-09 DIAGNOSIS — M1990 Unspecified osteoarthritis, unspecified site: Secondary | ICD-10-CM | POA: Diagnosis not present

## 2011-12-09 DIAGNOSIS — G250 Essential tremor: Secondary | ICD-10-CM | POA: Diagnosis not present

## 2011-12-15 ENCOUNTER — Ambulatory Visit: Payer: Medicare Other | Admitting: Oncology

## 2011-12-15 ENCOUNTER — Ambulatory Visit (HOSPITAL_BASED_OUTPATIENT_CLINIC_OR_DEPARTMENT_OTHER): Payer: Medicare Other

## 2011-12-15 DIAGNOSIS — R42 Dizziness and giddiness: Secondary | ICD-10-CM

## 2011-12-15 MED ORDER — SODIUM CHLORIDE 0.9 % IV SOLN
INTRAVENOUS | Status: DC
  Administered 2011-12-15: 11:00:00 via INTRAVENOUS

## 2011-12-15 NOTE — Progress Notes (Signed)
[id number]  Phlebotomy completed on left AC without difficulty and pt tolerated well.  565cc of blood extracted and no c/o of lightheadedness/dizziness voiced.  Pt eating snacks/drinks that are provided and instructed notify RN is any problems.    1105 Pt stated "feeling lightheaded"   BP 86/52 Put pt in Trendelenburg position and Dr. Darrold Span notified with orders given for fluid.  1110 Pt stated "I'm feeling better now"  Went to lobby and explained situation to husband and he verbalized understanding of situation.  1115 Pt continues to improve and vitals 131/71 p-59.    1145 Pt states "I don't feel dizzy, feeling back to normal"  Vitals BP 154/65 P-59 Husband at chair-side.

## 2011-12-15 NOTE — Patient Instructions (Signed)
1205 Instructed pt to eat/drink plenty today and to call if any problems.  Pt and husband verbalized understanding.

## 2012-03-15 DIAGNOSIS — G252 Other specified forms of tremor: Secondary | ICD-10-CM | POA: Diagnosis not present

## 2012-03-15 DIAGNOSIS — M25559 Pain in unspecified hip: Secondary | ICD-10-CM | POA: Diagnosis not present

## 2012-03-15 DIAGNOSIS — G250 Essential tremor: Secondary | ICD-10-CM | POA: Diagnosis not present

## 2012-03-15 DIAGNOSIS — I10 Essential (primary) hypertension: Secondary | ICD-10-CM | POA: Diagnosis not present

## 2012-03-15 DIAGNOSIS — M545 Low back pain: Secondary | ICD-10-CM | POA: Diagnosis not present

## 2012-03-29 DIAGNOSIS — Z85828 Personal history of other malignant neoplasm of skin: Secondary | ICD-10-CM | POA: Diagnosis not present

## 2012-03-29 DIAGNOSIS — D485 Neoplasm of uncertain behavior of skin: Secondary | ICD-10-CM | POA: Diagnosis not present

## 2012-04-04 DIAGNOSIS — M48061 Spinal stenosis, lumbar region without neurogenic claudication: Secondary | ICD-10-CM | POA: Diagnosis not present

## 2012-04-25 DIAGNOSIS — M48061 Spinal stenosis, lumbar region without neurogenic claudication: Secondary | ICD-10-CM | POA: Diagnosis not present

## 2012-04-27 DIAGNOSIS — H40019 Open angle with borderline findings, low risk, unspecified eye: Secondary | ICD-10-CM | POA: Diagnosis not present

## 2012-04-27 DIAGNOSIS — H04129 Dry eye syndrome of unspecified lacrimal gland: Secondary | ICD-10-CM | POA: Diagnosis not present

## 2012-05-04 DIAGNOSIS — M48061 Spinal stenosis, lumbar region without neurogenic claudication: Secondary | ICD-10-CM | POA: Diagnosis not present

## 2012-05-04 DIAGNOSIS — M1710 Unilateral primary osteoarthritis, unspecified knee: Secondary | ICD-10-CM | POA: Diagnosis not present

## 2012-05-04 DIAGNOSIS — R262 Difficulty in walking, not elsewhere classified: Secondary | ICD-10-CM | POA: Diagnosis not present

## 2012-05-09 DIAGNOSIS — R262 Difficulty in walking, not elsewhere classified: Secondary | ICD-10-CM | POA: Diagnosis not present

## 2012-05-09 DIAGNOSIS — M1710 Unilateral primary osteoarthritis, unspecified knee: Secondary | ICD-10-CM | POA: Diagnosis not present

## 2012-05-09 DIAGNOSIS — M48061 Spinal stenosis, lumbar region without neurogenic claudication: Secondary | ICD-10-CM | POA: Diagnosis not present

## 2012-05-11 DIAGNOSIS — M1710 Unilateral primary osteoarthritis, unspecified knee: Secondary | ICD-10-CM | POA: Diagnosis not present

## 2012-05-11 DIAGNOSIS — R262 Difficulty in walking, not elsewhere classified: Secondary | ICD-10-CM | POA: Diagnosis not present

## 2012-05-11 DIAGNOSIS — M48061 Spinal stenosis, lumbar region without neurogenic claudication: Secondary | ICD-10-CM | POA: Diagnosis not present

## 2012-05-17 DIAGNOSIS — M810 Age-related osteoporosis without current pathological fracture: Secondary | ICD-10-CM | POA: Diagnosis not present

## 2012-05-17 DIAGNOSIS — R262 Difficulty in walking, not elsewhere classified: Secondary | ICD-10-CM | POA: Diagnosis not present

## 2012-05-17 DIAGNOSIS — M1710 Unilateral primary osteoarthritis, unspecified knee: Secondary | ICD-10-CM | POA: Diagnosis not present

## 2012-05-17 DIAGNOSIS — M48061 Spinal stenosis, lumbar region without neurogenic claudication: Secondary | ICD-10-CM | POA: Diagnosis not present

## 2012-05-17 DIAGNOSIS — Z8262 Family history of osteoporosis: Secondary | ICD-10-CM | POA: Diagnosis not present

## 2012-05-19 DIAGNOSIS — R262 Difficulty in walking, not elsewhere classified: Secondary | ICD-10-CM | POA: Diagnosis not present

## 2012-05-19 DIAGNOSIS — M48061 Spinal stenosis, lumbar region without neurogenic claudication: Secondary | ICD-10-CM | POA: Diagnosis not present

## 2012-05-19 DIAGNOSIS — M1710 Unilateral primary osteoarthritis, unspecified knee: Secondary | ICD-10-CM | POA: Diagnosis not present

## 2012-05-23 DIAGNOSIS — M48061 Spinal stenosis, lumbar region without neurogenic claudication: Secondary | ICD-10-CM | POA: Diagnosis not present

## 2012-05-23 DIAGNOSIS — R262 Difficulty in walking, not elsewhere classified: Secondary | ICD-10-CM | POA: Diagnosis not present

## 2012-05-23 DIAGNOSIS — M1710 Unilateral primary osteoarthritis, unspecified knee: Secondary | ICD-10-CM | POA: Diagnosis not present

## 2012-05-26 DIAGNOSIS — R262 Difficulty in walking, not elsewhere classified: Secondary | ICD-10-CM | POA: Diagnosis not present

## 2012-05-26 DIAGNOSIS — M1710 Unilateral primary osteoarthritis, unspecified knee: Secondary | ICD-10-CM | POA: Diagnosis not present

## 2012-05-26 DIAGNOSIS — M48061 Spinal stenosis, lumbar region without neurogenic claudication: Secondary | ICD-10-CM | POA: Diagnosis not present

## 2012-05-31 DIAGNOSIS — M1710 Unilateral primary osteoarthritis, unspecified knee: Secondary | ICD-10-CM | POA: Diagnosis not present

## 2012-05-31 DIAGNOSIS — R262 Difficulty in walking, not elsewhere classified: Secondary | ICD-10-CM | POA: Diagnosis not present

## 2012-05-31 DIAGNOSIS — M48061 Spinal stenosis, lumbar region without neurogenic claudication: Secondary | ICD-10-CM | POA: Diagnosis not present

## 2012-06-02 DIAGNOSIS — M48061 Spinal stenosis, lumbar region without neurogenic claudication: Secondary | ICD-10-CM | POA: Diagnosis not present

## 2012-06-02 DIAGNOSIS — R262 Difficulty in walking, not elsewhere classified: Secondary | ICD-10-CM | POA: Diagnosis not present

## 2012-06-02 DIAGNOSIS — M1710 Unilateral primary osteoarthritis, unspecified knee: Secondary | ICD-10-CM | POA: Diagnosis not present

## 2012-06-06 DIAGNOSIS — M1710 Unilateral primary osteoarthritis, unspecified knee: Secondary | ICD-10-CM | POA: Diagnosis not present

## 2012-06-06 DIAGNOSIS — R262 Difficulty in walking, not elsewhere classified: Secondary | ICD-10-CM | POA: Diagnosis not present

## 2012-06-06 DIAGNOSIS — M48061 Spinal stenosis, lumbar region without neurogenic claudication: Secondary | ICD-10-CM | POA: Diagnosis not present

## 2012-06-14 DIAGNOSIS — M48061 Spinal stenosis, lumbar region without neurogenic claudication: Secondary | ICD-10-CM | POA: Diagnosis not present

## 2012-06-14 DIAGNOSIS — M1710 Unilateral primary osteoarthritis, unspecified knee: Secondary | ICD-10-CM | POA: Diagnosis not present

## 2012-06-14 DIAGNOSIS — R262 Difficulty in walking, not elsewhere classified: Secondary | ICD-10-CM | POA: Diagnosis not present

## 2012-06-16 ENCOUNTER — Other Ambulatory Visit (HOSPITAL_BASED_OUTPATIENT_CLINIC_OR_DEPARTMENT_OTHER): Payer: Medicare Other | Admitting: Lab

## 2012-06-16 DIAGNOSIS — R262 Difficulty in walking, not elsewhere classified: Secondary | ICD-10-CM | POA: Diagnosis not present

## 2012-06-16 DIAGNOSIS — M48061 Spinal stenosis, lumbar region without neurogenic claudication: Secondary | ICD-10-CM | POA: Diagnosis not present

## 2012-06-16 DIAGNOSIS — M1710 Unilateral primary osteoarthritis, unspecified knee: Secondary | ICD-10-CM | POA: Diagnosis not present

## 2012-06-16 LAB — COMPREHENSIVE METABOLIC PANEL (CC13)
ALT: 16 U/L (ref 0–55)
AST: 22 U/L (ref 5–34)
Alkaline Phosphatase: 68 U/L (ref 40–150)
CO2: 27 mEq/L (ref 22–29)
Sodium: 142 mEq/L (ref 136–145)
Total Bilirubin: 0.5 mg/dL (ref 0.20–1.20)
Total Protein: 6.5 g/dL (ref 6.4–8.3)

## 2012-06-16 LAB — CBC WITH DIFFERENTIAL/PLATELET
BASO%: 0.3 % (ref 0.0–2.0)
EOS%: 3 % (ref 0.0–7.0)
Eosinophils Absolute: 0.2 10*3/uL (ref 0.0–0.5)
MCHC: 34.4 g/dL (ref 31.5–36.0)
MCV: 97.9 fL (ref 79.5–101.0)
MONO%: 8.2 % (ref 0.0–14.0)
NEUT#: 4.2 10*3/uL (ref 1.5–6.5)
RBC: 4 10*6/uL (ref 3.70–5.45)
RDW: 12.5 % (ref 11.2–14.5)

## 2012-06-16 LAB — FERRITIN: Ferritin: 34 ng/mL (ref 10–291)

## 2012-06-17 ENCOUNTER — Telehealth: Payer: Self-pay

## 2012-06-17 NOTE — Telephone Encounter (Signed)
Spoke with Denise Lane and told her that her ferritin level from 06-16-12 was 34.  She does not need a phlebotomy per Dr. Precious Reel parameters of keeping ferritin level <=50 as noted in 11-23-11 office note.  Denise Lane will keep appt. as scheduled on 12-14-12 and 12-20-12.

## 2012-06-20 DIAGNOSIS — R262 Difficulty in walking, not elsewhere classified: Secondary | ICD-10-CM | POA: Diagnosis not present

## 2012-06-20 DIAGNOSIS — M48061 Spinal stenosis, lumbar region without neurogenic claudication: Secondary | ICD-10-CM | POA: Diagnosis not present

## 2012-06-20 DIAGNOSIS — M1710 Unilateral primary osteoarthritis, unspecified knee: Secondary | ICD-10-CM | POA: Diagnosis not present

## 2012-06-22 DIAGNOSIS — R262 Difficulty in walking, not elsewhere classified: Secondary | ICD-10-CM | POA: Diagnosis not present

## 2012-06-22 DIAGNOSIS — M1710 Unilateral primary osteoarthritis, unspecified knee: Secondary | ICD-10-CM | POA: Diagnosis not present

## 2012-06-22 DIAGNOSIS — M48061 Spinal stenosis, lumbar region without neurogenic claudication: Secondary | ICD-10-CM | POA: Diagnosis not present

## 2012-06-28 DIAGNOSIS — M48061 Spinal stenosis, lumbar region without neurogenic claudication: Secondary | ICD-10-CM | POA: Diagnosis not present

## 2012-06-28 DIAGNOSIS — R262 Difficulty in walking, not elsewhere classified: Secondary | ICD-10-CM | POA: Diagnosis not present

## 2012-06-28 DIAGNOSIS — M1710 Unilateral primary osteoarthritis, unspecified knee: Secondary | ICD-10-CM | POA: Diagnosis not present

## 2012-06-29 DIAGNOSIS — H43399 Other vitreous opacities, unspecified eye: Secondary | ICD-10-CM | POA: Diagnosis not present

## 2012-06-29 DIAGNOSIS — H40019 Open angle with borderline findings, low risk, unspecified eye: Secondary | ICD-10-CM | POA: Diagnosis not present

## 2012-06-29 DIAGNOSIS — H04129 Dry eye syndrome of unspecified lacrimal gland: Secondary | ICD-10-CM | POA: Diagnosis not present

## 2012-06-29 DIAGNOSIS — Z961 Presence of intraocular lens: Secondary | ICD-10-CM | POA: Diagnosis not present

## 2012-06-29 DIAGNOSIS — H353190 Nonexudative age-related macular degeneration, unspecified eye, stage unspecified: Secondary | ICD-10-CM | POA: Diagnosis not present

## 2012-06-29 DIAGNOSIS — H35369 Drusen (degenerative) of macula, unspecified eye: Secondary | ICD-10-CM | POA: Diagnosis not present

## 2012-06-30 DIAGNOSIS — M48061 Spinal stenosis, lumbar region without neurogenic claudication: Secondary | ICD-10-CM | POA: Diagnosis not present

## 2012-06-30 DIAGNOSIS — M1710 Unilateral primary osteoarthritis, unspecified knee: Secondary | ICD-10-CM | POA: Diagnosis not present

## 2012-06-30 DIAGNOSIS — R262 Difficulty in walking, not elsewhere classified: Secondary | ICD-10-CM | POA: Diagnosis not present

## 2012-07-07 DIAGNOSIS — E785 Hyperlipidemia, unspecified: Secondary | ICD-10-CM | POA: Diagnosis not present

## 2012-07-07 DIAGNOSIS — E559 Vitamin D deficiency, unspecified: Secondary | ICD-10-CM | POA: Diagnosis not present

## 2012-07-07 DIAGNOSIS — I10 Essential (primary) hypertension: Secondary | ICD-10-CM | POA: Diagnosis not present

## 2012-07-07 DIAGNOSIS — R7989 Other specified abnormal findings of blood chemistry: Secondary | ICD-10-CM | POA: Diagnosis not present

## 2012-07-12 DIAGNOSIS — R32 Unspecified urinary incontinence: Secondary | ICD-10-CM | POA: Diagnosis not present

## 2012-07-12 DIAGNOSIS — M545 Low back pain: Secondary | ICD-10-CM | POA: Diagnosis not present

## 2012-07-12 DIAGNOSIS — G250 Essential tremor: Secondary | ICD-10-CM | POA: Diagnosis not present

## 2012-07-12 DIAGNOSIS — G252 Other specified forms of tremor: Secondary | ICD-10-CM | POA: Diagnosis not present

## 2012-07-12 DIAGNOSIS — I10 Essential (primary) hypertension: Secondary | ICD-10-CM | POA: Diagnosis not present

## 2012-09-06 DIAGNOSIS — R7989 Other specified abnormal findings of blood chemistry: Secondary | ICD-10-CM | POA: Diagnosis not present

## 2012-09-06 DIAGNOSIS — M2550 Pain in unspecified joint: Secondary | ICD-10-CM | POA: Diagnosis not present

## 2012-09-06 DIAGNOSIS — I10 Essential (primary) hypertension: Secondary | ICD-10-CM | POA: Diagnosis not present

## 2012-09-27 DIAGNOSIS — M2548 Effusion, other site: Secondary | ICD-10-CM | POA: Diagnosis not present

## 2012-09-27 DIAGNOSIS — G609 Hereditary and idiopathic neuropathy, unspecified: Secondary | ICD-10-CM | POA: Diagnosis not present

## 2012-09-27 DIAGNOSIS — M25559 Pain in unspecified hip: Secondary | ICD-10-CM | POA: Diagnosis not present

## 2012-09-28 DIAGNOSIS — M545 Low back pain: Secondary | ICD-10-CM | POA: Diagnosis not present

## 2012-09-28 DIAGNOSIS — M1610 Unilateral primary osteoarthritis, unspecified hip: Secondary | ICD-10-CM | POA: Diagnosis not present

## 2012-09-28 DIAGNOSIS — M25569 Pain in unspecified knee: Secondary | ICD-10-CM | POA: Diagnosis not present

## 2012-10-11 ENCOUNTER — Encounter (HOSPITAL_COMMUNITY): Payer: Self-pay | Admitting: Respiratory Therapy

## 2012-10-14 ENCOUNTER — Encounter (HOSPITAL_COMMUNITY): Payer: Self-pay

## 2012-10-14 ENCOUNTER — Encounter (HOSPITAL_COMMUNITY)
Admission: RE | Admit: 2012-10-14 | Discharge: 2012-10-14 | Disposition: A | Discharge status: Home / Self Care | Payer: Medicare Other | Source: Ambulatory Visit | Attending: Orthopedic Surgery | Admitting: Orthopedic Surgery

## 2012-10-14 ENCOUNTER — Encounter (HOSPITAL_COMMUNITY)
Admission: RE | Admit: 2012-10-14 | Discharge: 2012-10-14 | Disposition: A | Discharge status: Home / Self Care | Payer: Medicare Other | Source: Ambulatory Visit | Attending: Anesthesiology | Admitting: Anesthesiology

## 2012-10-14 DIAGNOSIS — Z01818 Encounter for other preprocedural examination: Secondary | ICD-10-CM | POA: Diagnosis not present

## 2012-10-14 HISTORY — DX: Tremor, unspecified: R25.1

## 2012-10-14 HISTORY — DX: Personal history of other medical treatment: Z92.89

## 2012-10-14 HISTORY — DX: Vomiting, unspecified: R11.10

## 2012-10-14 HISTORY — DX: Personal history of other diseases of the respiratory system: Z87.09

## 2012-10-14 HISTORY — DX: Pain in unspecified joint: M25.50

## 2012-10-14 HISTORY — DX: Spontaneous ecchymoses: R23.3

## 2012-10-14 HISTORY — DX: Nausea with vomiting, unspecified: R11.2

## 2012-10-14 HISTORY — DX: Gastric ulcer, unspecified as acute or chronic, without hemorrhage or perforation: K25.9

## 2012-10-14 HISTORY — DX: Personal history of other diseases of the digestive system: Z87.19

## 2012-10-14 HISTORY — DX: Dry eye syndrome of bilateral lacrimal glands: H04.123

## 2012-10-14 HISTORY — DX: Effusion, unspecified joint: M25.40

## 2012-10-14 HISTORY — DX: Depression, unspecified: F32.A

## 2012-10-14 HISTORY — DX: Age-related osteoporosis without current pathological fracture: M81.0

## 2012-10-14 HISTORY — DX: Rheumatic fever without heart involvement: I00

## 2012-10-14 HISTORY — DX: Major depressive disorder, single episode, unspecified: F32.9

## 2012-10-14 HISTORY — DX: Other skin changes: R23.8

## 2012-10-14 HISTORY — DX: Urgency of urination: R39.15

## 2012-10-14 HISTORY — DX: Other specified postprocedural states: Z98.890

## 2012-10-14 LAB — BASIC METABOLIC PANEL
BUN: 16 mg/dL (ref 6–23)
Chloride: 96 mEq/L (ref 96–112)
GFR calc non Af Amer: 79 mL/min — ABNORMAL LOW (ref >90)
Sodium: 136 mEq/L (ref 135–145)

## 2012-10-14 LAB — CBC
HCT: 37 % (ref 36.0–46.0)
Hemoglobin: 12.8 g/dL (ref 12.0–15.0)
MCH: 32.6 pg (ref 26.0–34.0)
MCHC: 34.6 g/dL (ref 30.0–36.0)
MCV: 94.1 fL (ref 78.0–100.0)
RBC: 3.93 MIL/uL (ref 3.87–5.11)

## 2012-10-14 LAB — SURGICAL PCR SCREEN
MRSA, PCR: NEGATIVE
Staphylococcus aureus: POSITIVE — AB

## 2012-10-14 LAB — ABO/RH: ABO/RH(D): O POS

## 2012-10-14 LAB — TYPE AND SCREEN
ABO/RH(D): O POS
Antibody Screen: NEGATIVE

## 2012-10-14 LAB — PROTIME-INR: Prothrombin Time: 13.2 seconds (ref 11.6–15.2)

## 2012-10-14 NOTE — Pre-Procedure Instructions (Signed)
[address] Denise [address]  10/14/2012   Your procedure is scheduled on:  Tues, Jan 7 @ 10:30 AM  Report to [facility] at 8:30 AM.  Call this number if you have problems the morning of surgery: [phone]   Remember:   Do not eat food:After Midnight.  May have clear liquids:until Midnight .    Take these medicines the morning of surgery with A SIP OF WATER: Tramadol(Ultram),Omeprazole(Prilosec),Lopressor(Metoprolol),Eye Drops,and Gabapentin(Neuronitin)             Stop taking Lovaza,No Aspirin, Ibuprofen,Goody's,BC's,Aleve  Do not wear jewelry, make-up or nail polish.  Do not wear lotions, powders, or perfumes. You may wear deodorant.  Do not shave 48 hours prior to surgery.  Do not bring valuables to the hospital.  Contacts, dentures or bridgework may not be worn into surgery.  Leave suitcase in the car. After surgery it may be brought to your room.  For patients admitted to the hospital, checkout time is 11:00 AM the day of discharge.   Patients discharged the day of surgery will not be allowed to drive home.    Special Instructions: Shower using CHG 2 nights before surgery and the night before surgery.  If you shower the day of surgery use CHG.  Use special wash - you have one bottle of CHG for all showers.  You should use approximately 1/3 of the bottle for each shower.   Please read over the following fact sheets that you were given: Pain Booklet, Coughing and Deep Breathing, Blood Transfusion Information, Total Joint Packet, MRSA Information and Surgical Site Infection Prevention

## 2012-10-14 NOTE — Progress Notes (Signed)
Dr.Livesay is hematologist

## 2012-10-14 NOTE — Progress Notes (Signed)
Orders requested

## 2012-10-14 NOTE — Progress Notes (Signed)
Pt doesn't have a cardiologist  Denies ever having an echo/stress test/heart cath  Dr.Arthur Chilton Si is Medical MD  EKG to be requested from Dr.Arthur Chilton Si  Denies CXR being done within past yr

## 2012-10-17 NOTE — Progress Notes (Addendum)
Requested ekg from Dr. Thomasene Lot office [phone].  1510  Monday, EKG (11/2011) from office, and last office visit notes placed inside chart (under health hx)....da  1515.SPOKE WITH OFFICE REGARDING SIGNING OF ORDERS....SHE WILL LET PA  KNOW.Marland KitchenMarland KitchenDA

## 2012-10-18 ENCOUNTER — Inpatient Hospital Stay (HOSPITAL_COMMUNITY)
Admission: RE | Admit: 2012-10-18 | Discharge: 2012-10-21 | DRG: 470 | Disposition: A | Discharge status: Home Health | Payer: Medicare Other | Source: Ambulatory Visit | Attending: Orthopedic Surgery | Admitting: Orthopedic Surgery

## 2012-10-18 ENCOUNTER — Encounter (HOSPITAL_COMMUNITY): Payer: Self-pay | Admitting: Orthopedic Surgery

## 2012-10-18 ENCOUNTER — Inpatient Hospital Stay (HOSPITAL_COMMUNITY): Payer: Medicare Other | Admitting: Anesthesiology

## 2012-10-18 ENCOUNTER — Inpatient Hospital Stay (HOSPITAL_COMMUNITY): Payer: Medicare Other

## 2012-10-18 ENCOUNTER — Encounter (HOSPITAL_COMMUNITY)
Admission: RE | Disposition: A | Discharge status: Home Health | Payer: Self-pay | Source: Ambulatory Visit | Attending: Orthopedic Surgery

## 2012-10-18 ENCOUNTER — Encounter (HOSPITAL_COMMUNITY): Payer: Self-pay | Admitting: Anesthesiology

## 2012-10-18 ENCOUNTER — Encounter (HOSPITAL_COMMUNITY): Payer: Self-pay | Admitting: *Deleted

## 2012-10-18 DIAGNOSIS — Z79899 Other long term (current) drug therapy: Secondary | ICD-10-CM | POA: Diagnosis not present

## 2012-10-18 DIAGNOSIS — I10 Essential (primary) hypertension: Secondary | ICD-10-CM | POA: Diagnosis not present

## 2012-10-18 DIAGNOSIS — M169 Osteoarthritis of hip, unspecified: Secondary | ICD-10-CM | POA: Diagnosis not present

## 2012-10-18 DIAGNOSIS — Z01812 Encounter for preprocedural laboratory examination: Secondary | ICD-10-CM | POA: Diagnosis not present

## 2012-10-18 DIAGNOSIS — Z860100 Personal history of colon polyps, unspecified: Secondary | ICD-10-CM

## 2012-10-18 DIAGNOSIS — M1610 Unilateral primary osteoarthritis, unspecified hip: Secondary | ICD-10-CM | POA: Diagnosis not present

## 2012-10-18 DIAGNOSIS — Z882 Allergy status to sulfonamides status: Secondary | ICD-10-CM

## 2012-10-18 DIAGNOSIS — Z888 Allergy status to other drugs, medicaments and biological substances status: Secondary | ICD-10-CM | POA: Diagnosis not present

## 2012-10-18 DIAGNOSIS — Z96649 Presence of unspecified artificial hip joint: Secondary | ICD-10-CM | POA: Diagnosis not present

## 2012-10-18 DIAGNOSIS — G250 Essential tremor: Secondary | ICD-10-CM | POA: Diagnosis present

## 2012-10-18 DIAGNOSIS — E785 Hyperlipidemia, unspecified: Secondary | ICD-10-CM | POA: Diagnosis present

## 2012-10-18 DIAGNOSIS — K219 Gastro-esophageal reflux disease without esophagitis: Secondary | ICD-10-CM | POA: Diagnosis present

## 2012-10-18 DIAGNOSIS — M810 Age-related osteoporosis without current pathological fracture: Secondary | ICD-10-CM | POA: Diagnosis present

## 2012-10-18 DIAGNOSIS — H04129 Dry eye syndrome of unspecified lacrimal gland: Secondary | ICD-10-CM | POA: Diagnosis present

## 2012-10-18 DIAGNOSIS — G252 Other specified forms of tremor: Secondary | ICD-10-CM | POA: Diagnosis present

## 2012-10-18 DIAGNOSIS — Z7901 Long term (current) use of anticoagulants: Secondary | ICD-10-CM | POA: Diagnosis not present

## 2012-10-18 DIAGNOSIS — Z471 Aftercare following joint replacement surgery: Secondary | ICD-10-CM | POA: Diagnosis not present

## 2012-10-18 DIAGNOSIS — M1611 Unilateral primary osteoarthritis, right hip: Secondary | ICD-10-CM | POA: Diagnosis present

## 2012-10-18 DIAGNOSIS — D62 Acute posthemorrhagic anemia: Secondary | ICD-10-CM | POA: Diagnosis not present

## 2012-10-18 HISTORY — DX: Low back pain: M54.5

## 2012-10-18 HISTORY — DX: Unilateral primary osteoarthritis, right hip: M16.11

## 2012-10-18 HISTORY — DX: Basal cell carcinoma of skin, unspecified: C44.91

## 2012-10-18 HISTORY — PX: TOTAL HIP ARTHROPLASTY: SHX124

## 2012-10-18 HISTORY — DX: Other chronic pain: G89.29

## 2012-10-18 HISTORY — DX: Reserved for concepts with insufficient information to code with codable children: IMO0002

## 2012-10-18 HISTORY — DX: Low back pain, unspecified: M54.50

## 2012-10-18 LAB — CBC
MCH: 32.9 pg (ref 26.0–34.0)
MCHC: 35.3 g/dL (ref 30.0–36.0)
MCV: 93.3 fL (ref 78.0–100.0)
Platelets: 254 10*3/uL (ref 150–400)
RBC: 3.28 MIL/uL — ABNORMAL LOW (ref 3.87–5.11)
RDW: 11.9 % (ref 11.5–15.5)

## 2012-10-18 LAB — CREATININE, SERUM: Creatinine, Ser: 0.66 mg/dL (ref 0.50–1.10)

## 2012-10-18 SURGERY — ARTHROPLASTY, HIP, TOTAL,POSTERIOR APPROACH
Anesthesia: General | Site: Hip | Laterality: Right | Wound class: Clean

## 2012-10-18 MED ORDER — OXYCODONE HCL 5 MG PO TABS: 5.0000 mg | ORAL_TABLET | Freq: Once | ORAL | Status: DC | PRN

## 2012-10-18 MED ORDER — DEXTROSE 5 % IV SOLN
500.0000 mg | Freq: Four times a day (QID) | INTRAVENOUS | Status: DC | PRN
  Administered 2012-10-18: 500 mg via INTRAVENOUS
  Filled 2012-10-18: qty 5

## 2012-10-18 MED ORDER — CEFAZOLIN SODIUM 1-5 GM-% IV SOLN
INTRAVENOUS | Status: DC | PRN
  Administered 2012-10-18: 2 g via INTRAVENOUS

## 2012-10-18 MED ORDER — ACETAMINOPHEN 10 MG/ML IV SOLN
INTRAVENOUS | Status: AC
  Filled 2012-10-18: qty 100

## 2012-10-18 MED ORDER — POTASSIUM CHLORIDE IN NACL 20-0.45 MEQ/L-% IV SOLN
INTRAVENOUS | Status: DC
  Administered 2012-10-18: 22:00:00 via INTRAVENOUS
  Administered 2012-10-19: 75 mL/h via INTRAVENOUS
  Filled 2012-10-18 (×6): qty 1000

## 2012-10-18 MED ORDER — POLYETHYLENE GLYCOL 3350 17 G PO PACK: 17.0000 g | PACK | Freq: Every day | ORAL | Status: DC | PRN

## 2012-10-18 MED ORDER — ALUM & MAG HYDROXIDE-SIMETH 200-200-20 MG/5ML PO SUSP: 30.0000 mL | ORAL | Status: DC | PRN

## 2012-10-18 MED ORDER — NEOSTIGMINE METHYLSULFATE 1 MG/ML IJ SOLN
INTRAMUSCULAR | Status: DC | PRN
  Administered 2012-10-18: 3 mg via INTRAVENOUS

## 2012-10-18 MED ORDER — WARFARIN - PHARMACIST DOSING INPATIENT: Freq: Every day | Status: DC

## 2012-10-18 MED ORDER — CHOLECALCIFEROL 250 MCG (10000 UT) PO CAPS: 1.0000 | ORAL_CAPSULE | Freq: Two times a day (BID) | ORAL | Status: DC

## 2012-10-18 MED ORDER — LIDOCAINE HCL (CARDIAC) 20 MG/ML IV SOLN
INTRAVENOUS | Status: DC | PRN
  Administered 2012-10-18: 100 mg via INTRAVENOUS

## 2012-10-18 MED ORDER — LACTATED RINGERS IV SOLN: INTRAVENOUS | Status: DC

## 2012-10-18 MED ORDER — METHOCARBAMOL 100 MG/ML IJ SOLN
500.0000 mg | INTRAVENOUS | Status: AC
  Administered 2012-10-18: 500 mg via INTRAVENOUS
  Filled 2012-10-18: qty 5

## 2012-10-18 MED ORDER — OXYCODONE-ACETAMINOPHEN 5-325 MG PO TABS: 1.0000 | ORAL_TABLET | Freq: Four times a day (QID) | ORAL | Status: DC | PRN

## 2012-10-18 MED ORDER — OXYCODONE HCL 5 MG/5ML PO SOLN: 5.0000 mg | Freq: Once | ORAL | Status: DC | PRN

## 2012-10-18 MED ORDER — WARFARIN SODIUM 5 MG PO TABS: 5.0000 mg | ORAL_TABLET | Freq: Every day | ORAL | Status: DC

## 2012-10-18 MED ORDER — FENTANYL CITRATE 0.05 MG/ML IJ SOLN
INTRAMUSCULAR | Status: AC
  Filled 2012-10-18: qty 2

## 2012-10-18 MED ORDER — CALCIUM CITRATE 950 (200 CA) MG PO TABS
1.0000 | ORAL_TABLET | Freq: Every day | ORAL | Status: DC
  Administered 2012-10-19 – 2012-10-21 (×3): 200 mg via ORAL
  Filled 2012-10-18 (×3): qty 1

## 2012-10-18 MED ORDER — LACTATED RINGERS IV SOLN
INTRAVENOUS | Status: DC
  Administered 2012-10-18: 11:00:00 via INTRAVENOUS

## 2012-10-18 MED ORDER — OMEGA-3-ACID ETHYL ESTERS 1 G PO CAPS
1.0000 g | ORAL_CAPSULE | Freq: Three times a day (TID) | ORAL | Status: DC
  Administered 2012-10-18 – 2012-10-21 (×8): 1 g via ORAL
  Filled 2012-10-18 (×11): qty 1

## 2012-10-18 MED ORDER — DOCUSATE SODIUM 100 MG PO CAPS
100.0000 mg | ORAL_CAPSULE | Freq: Two times a day (BID) | ORAL | Status: DC
  Administered 2012-10-18 – 2012-10-20 (×5): 100 mg via ORAL
  Filled 2012-10-18 (×6): qty 1

## 2012-10-18 MED ORDER — ARTIFICIAL TEARS OP OINT
TOPICAL_OINTMENT | OPHTHALMIC | Status: DC | PRN
  Administered 2012-10-18: 1 via OPHTHALMIC

## 2012-10-18 MED ORDER — ONDANSETRON HCL 4 MG/2ML IJ SOLN: 4.0000 mg | Freq: Once | INTRAMUSCULAR | Status: DC | PRN

## 2012-10-18 MED ORDER — OXYCODONE-ACETAMINOPHEN 5-325 MG PO TABS
1.0000 | ORAL_TABLET | Freq: Four times a day (QID) | ORAL | Status: DC | PRN
  Administered 2012-10-20 – 2012-10-21 (×3): 1 via ORAL
  Filled 2012-10-18 (×4): qty 1
  Filled 2012-10-18: qty 2
  Filled 2012-10-18 (×2): qty 1

## 2012-10-18 MED ORDER — METOCLOPRAMIDE HCL 10 MG PO TABS: 5.0000 mg | ORAL_TABLET | Freq: Three times a day (TID) | ORAL | Status: DC | PRN

## 2012-10-18 MED ORDER — FENTANYL CITRATE 0.05 MG/ML IJ SOLN
INTRAMUSCULAR | Status: AC
  Administered 2012-10-18: 50 ug via INTRAVENOUS
  Filled 2012-10-18: qty 2

## 2012-10-18 MED ORDER — OXYBUTYNIN CHLORIDE 5 MG PO TABS
5.0000 mg | ORAL_TABLET | Freq: Two times a day (BID) | ORAL | Status: DC
  Administered 2012-10-18 – 2012-10-21 (×6): 5 mg via ORAL
  Filled 2012-10-18 (×7): qty 1

## 2012-10-18 MED ORDER — ACETAMINOPHEN 325 MG PO TABS: 650.0000 mg | ORAL_TABLET | Freq: Four times a day (QID) | ORAL | Status: DC | PRN

## 2012-10-18 MED ORDER — POLYVINYL ALCOHOL 1.4 % OP SOLN
1.0000 [drp] | Freq: Three times a day (TID) | OPHTHALMIC | Status: DC | PRN
  Filled 2012-10-18: qty 15

## 2012-10-18 MED ORDER — SENNA 8.6 MG PO TABS
1.0000 | ORAL_TABLET | Freq: Two times a day (BID) | ORAL | Status: DC
  Administered 2012-10-18 – 2012-10-20 (×5): 8.6 mg via ORAL
  Filled 2012-10-18 (×7): qty 1

## 2012-10-18 MED ORDER — KETOROLAC TROMETHAMINE 15 MG/ML IJ SOLN
15.0000 mg | Freq: Four times a day (QID) | INTRAMUSCULAR | Status: AC
  Administered 2012-10-18 – 2012-10-19 (×3): 15 mg via INTRAVENOUS
  Filled 2012-10-18 (×4): qty 1

## 2012-10-18 MED ORDER — GABAPENTIN 100 MG PO CAPS
100.0000 mg | ORAL_CAPSULE | Freq: Four times a day (QID) | ORAL | Status: DC
  Administered 2012-10-18 – 2012-10-21 (×9): 100 mg via ORAL
  Filled 2012-10-18 (×14): qty 1

## 2012-10-18 MED ORDER — DIPHENHYDRAMINE HCL 12.5 MG/5ML PO ELIX: 12.5000 mg | ORAL_SOLUTION | ORAL | Status: DC | PRN

## 2012-10-18 MED ORDER — WARFARIN SODIUM 5 MG PO TABS
5.0000 mg | ORAL_TABLET | Freq: Once | ORAL | Status: AC
  Administered 2012-10-18: 5 mg via ORAL
  Filled 2012-10-18: qty 1

## 2012-10-18 MED ORDER — ONDANSETRON HCL 4 MG PO TABS
4.0000 mg | ORAL_TABLET | Freq: Four times a day (QID) | ORAL | Status: DC | PRN
  Administered 2012-10-21: 4 mg via ORAL
  Filled 2012-10-18: qty 1

## 2012-10-18 MED ORDER — ENOXAPARIN SODIUM 30 MG/0.3ML ~~LOC~~ SOLN
30.0000 mg | SUBCUTANEOUS | Status: DC
  Administered 2012-10-19 – 2012-10-20 (×2): 30 mg via SUBCUTANEOUS
  Filled 2012-10-18 (×3): qty 0.3

## 2012-10-18 MED ORDER — PHENYLEPHRINE HCL 10 MG/ML IJ SOLN
INTRAMUSCULAR | Status: DC | PRN
  Administered 2012-10-18 (×2): 40 ug via INTRAVENOUS

## 2012-10-18 MED ORDER — MEPERIDINE HCL 25 MG/ML IJ SOLN: 6.2500 mg | INTRAMUSCULAR | Status: DC | PRN

## 2012-10-18 MED ORDER — METOCLOPRAMIDE HCL 5 MG/ML IJ SOLN: 5.0000 mg | Freq: Three times a day (TID) | INTRAMUSCULAR | Status: DC | PRN

## 2012-10-18 MED ORDER — FENTANYL CITRATE 0.05 MG/ML IJ SOLN: 50.0000 ug | Freq: Once | INTRAMUSCULAR | Status: DC

## 2012-10-18 MED ORDER — DEXAMETHASONE SODIUM PHOSPHATE 4 MG/ML IJ SOLN
INTRAMUSCULAR | Status: DC | PRN
  Administered 2012-10-18: 4 mg via INTRAVENOUS

## 2012-10-18 MED ORDER — PATIENT'S GUIDE TO USING COUMADIN BOOK
Freq: Once | Status: AC
  Administered 2012-10-18: 23:00:00
  Filled 2012-10-18: qty 1

## 2012-10-18 MED ORDER — PROPOFOL 10 MG/ML IV BOLUS
INTRAVENOUS | Status: DC | PRN
  Administered 2012-10-18: 120 mg via INTRAVENOUS

## 2012-10-18 MED ORDER — LACTATED RINGERS IV SOLN
INTRAVENOUS | Status: DC | PRN
  Administered 2012-10-18: 12:00:00 via INTRAVENOUS

## 2012-10-18 MED ORDER — METOPROLOL TARTRATE 25 MG PO TABS
25.0000 mg | ORAL_TABLET | Freq: Every day | ORAL | Status: DC
  Administered 2012-10-19 – 2012-10-21 (×3): 25 mg via ORAL
  Filled 2012-10-18 (×3): qty 1

## 2012-10-18 MED ORDER — PHENOL 1.4 % MT LIQD: 1.0000 | OROMUCOSAL | Status: DC | PRN

## 2012-10-18 MED ORDER — CARBOXYMETHYLCELLULOSE SODIUM 0.5 % OP SOLN: 1.0000 [drp] | Freq: Three times a day (TID) | OPHTHALMIC | Status: DC | PRN

## 2012-10-18 MED ORDER — PANTOPRAZOLE SODIUM 40 MG PO TBEC
40.0000 mg | DELAYED_RELEASE_TABLET | Freq: Every day | ORAL | Status: DC
  Administered 2012-10-19: 40 mg via ORAL

## 2012-10-18 MED ORDER — ONDANSETRON HCL 4 MG/2ML IJ SOLN
INTRAMUSCULAR | Status: DC | PRN
  Administered 2012-10-18: 4 mg via INTRAVENOUS

## 2012-10-18 MED ORDER — ACETAMINOPHEN 10 MG/ML IV SOLN: 1000.0000 mg | Freq: Once | INTRAVENOUS | Status: DC

## 2012-10-18 MED ORDER — ACETAMINOPHEN 10 MG/ML IV SOLN
1000.0000 mg | Freq: Once | INTRAVENOUS | Status: AC | PRN
  Administered 2012-10-18: 1000 mg via INTRAVENOUS

## 2012-10-18 MED ORDER — ONDANSETRON HCL 4 MG/2ML IJ SOLN: 4.0000 mg | Freq: Four times a day (QID) | INTRAMUSCULAR | Status: DC | PRN

## 2012-10-18 MED ORDER — FENTANYL CITRATE 0.05 MG/ML IJ SOLN
25.0000 ug | INTRAMUSCULAR | Status: DC | PRN
  Administered 2012-10-18: 25 ug via INTRAVENOUS
  Administered 2012-10-18 (×2): 50 ug via INTRAVENOUS
  Administered 2012-10-18: 25 ug via INTRAVENOUS
  Administered 2012-10-18: 50 ug via INTRAVENOUS

## 2012-10-18 MED ORDER — OXYCODONE-ACETAMINOPHEN 5-325 MG PO TABS
1.0000 | ORAL_TABLET | Freq: Four times a day (QID) | ORAL | Status: DC | PRN
  Administered 2012-10-19: 1 via ORAL
  Administered 2012-10-19: 2 via ORAL
  Administered 2012-10-19 – 2012-10-21 (×3): 1 via ORAL
  Filled 2012-10-18: qty 2

## 2012-10-18 MED ORDER — WARFARIN VIDEO
Freq: Once | Status: AC
  Administered 2012-10-18: 23:00:00

## 2012-10-18 MED ORDER — FENTANYL CITRATE 0.05 MG/ML IJ SOLN
INTRAMUSCULAR | Status: DC | PRN
  Administered 2012-10-18: 50 ug via INTRAVENOUS
  Administered 2012-10-18: 150 ug via INTRAVENOUS
  Administered 2012-10-18 (×2): 50 ug via INTRAVENOUS

## 2012-10-18 MED ORDER — CEFAZOLIN SODIUM-DEXTROSE 2-3 GM-% IV SOLR: 2.0000 g | INTRAVENOUS | Status: DC

## 2012-10-18 MED ORDER — TRAMADOL HCL 50 MG PO TABS: 50.0000 mg | ORAL_TABLET | Freq: Four times a day (QID) | ORAL | Status: DC | PRN

## 2012-10-18 MED ORDER — MENTHOL 3 MG MT LOZG: 1.0000 | LOZENGE | OROMUCOSAL | Status: DC | PRN

## 2012-10-18 MED ORDER — SENNA-DOCUSATE SODIUM 8.6-50 MG PO TABS: 1.0000 | ORAL_TABLET | Freq: Every day | ORAL | Status: DC

## 2012-10-18 MED ORDER — CEFAZOLIN SODIUM 1-5 GM-% IV SOLN
1.0000 g | Freq: Four times a day (QID) | INTRAVENOUS | Status: AC
  Administered 2012-10-18 – 2012-10-19 (×2): 1 g via INTRAVENOUS
  Filled 2012-10-18 (×2): qty 50

## 2012-10-18 MED ORDER — HYDROCODONE-ACETAMINOPHEN 10-325 MG PO TABS
1.0000 | ORAL_TABLET | ORAL | Status: DC | PRN
  Filled 2012-10-18: qty 2

## 2012-10-18 MED ORDER — ACETAMINOPHEN 10 MG/ML IV SOLN
1000.0000 mg | Freq: Four times a day (QID) | INTRAVENOUS | Status: AC
  Administered 2012-10-18 – 2012-10-19 (×4): 1000 mg via INTRAVENOUS
  Filled 2012-10-18 (×4): qty 100

## 2012-10-18 MED ORDER — CEFAZOLIN SODIUM 1-5 GM-% IV SOLN: INTRAVENOUS | Status: DC | PRN

## 2012-10-18 MED ORDER — METHOCARBAMOL 500 MG PO TABS
500.0000 mg | ORAL_TABLET | Freq: Four times a day (QID) | ORAL | Status: DC | PRN
  Administered 2012-10-20 – 2012-10-21 (×3): 500 mg via ORAL
  Filled 2012-10-18 (×2): qty 1

## 2012-10-18 MED ORDER — GLYCOPYRROLATE 0.2 MG/ML IJ SOLN
INTRAMUSCULAR | Status: DC | PRN
  Administered 2012-10-18: 0.6 mg via INTRAVENOUS

## 2012-10-18 MED ORDER — SORBITOL 70 % SOLN: 30.0000 mL | Freq: Every day | Status: DC | PRN

## 2012-10-18 MED ORDER — EPHEDRINE SULFATE 50 MG/ML IJ SOLN
INTRAMUSCULAR | Status: DC | PRN
  Administered 2012-10-18 (×2): 5 mg via INTRAVENOUS

## 2012-10-18 MED ORDER — SODIUM CHLORIDE 0.9 % IR SOLN
Status: DC | PRN
  Administered 2012-10-18 (×2): 1000 mL

## 2012-10-18 MED ORDER — ROCURONIUM BROMIDE 100 MG/10ML IV SOLN
INTRAVENOUS | Status: DC | PRN
  Administered 2012-10-18: 50 mg via INTRAVENOUS

## 2012-10-18 MED ORDER — ACETAMINOPHEN 650 MG RE SUPP: 650.0000 mg | Freq: Four times a day (QID) | RECTAL | Status: DC | PRN

## 2012-10-18 SURGICAL SUPPLY — 64 items
APL SKNCLS STERI-STRIP NONHPOA (GAUZE/BANDAGES/DRESSINGS) ×1
BENZOIN TINCTURE PRP APPL 2/3 (GAUZE/BANDAGES/DRESSINGS) ×2 IMPLANT
BLADE SAW SAG 73X25 THK (BLADE) ×1
BLADE SAW SGTL 73X25 THK (BLADE) ×1 IMPLANT
BRUSH FEMORAL CANAL (MISCELLANEOUS) IMPLANT
CLOTH BEACON ORANGE TIMEOUT ST (SAFETY) ×2 IMPLANT
CLSR STERI-STRIP ANTIMIC 1/2X4 (GAUZE/BANDAGES/DRESSINGS) ×1 IMPLANT
COVER BACK TABLE 24X17X13 BIG (DRAPES) ×1 IMPLANT
COVER SURGICAL LIGHT HANDLE (MISCELLANEOUS) ×2 IMPLANT
DRAPE INCISE IOBAN 66X45 STRL (DRAPES) ×1 IMPLANT
DRAPE ORTHO SPLIT 77X108 STRL (DRAPES) ×4
DRAPE PROXIMA HALF (DRAPES) ×2 IMPLANT
DRAPE SURG ORHT 6 SPLT 77X108 (DRAPES) ×2 IMPLANT
DRAPE U-SHAPE 47X51 STRL (DRAPES) ×2 IMPLANT
DRILL BIT 5/64 (BIT) ×2 IMPLANT
DRSG MEPILEX BORDER 4X12 (GAUZE/BANDAGES/DRESSINGS) IMPLANT
DRSG MEPILEX BORDER 4X8 (GAUZE/BANDAGES/DRESSINGS) ×1 IMPLANT
DRSG PAD ABDOMINAL 8X10 ST (GAUZE/BANDAGES/DRESSINGS) ×4 IMPLANT
DURAPREP 26ML APPLICATOR (WOUND CARE) ×2 IMPLANT
ELECT CAUTERY BLADE 6.4 (BLADE) ×2 IMPLANT
ELECT REM PT RETURN 9FT ADLT (ELECTROSURGICAL) ×2
ELECTRODE REM PT RTRN 9FT ADLT (ELECTROSURGICAL) ×1 IMPLANT
EVACUATOR 1/8 PVC DRAIN (DRAIN) IMPLANT
GLOVE BIOGEL PI IND STRL 8 (GLOVE) ×1 IMPLANT
GLOVE BIOGEL PI INDICATOR 8 (GLOVE) ×1
GLOVE ORTHO TXT STRL SZ7.5 (GLOVE) ×2 IMPLANT
GLOVE SURG ORTHO 8.0 STRL STRW (GLOVE) ×4 IMPLANT
GOWN STRL NON-REIN LRG LVL3 (GOWN DISPOSABLE) ×1 IMPLANT
HANDPIECE INTERPULSE COAX TIP (DISPOSABLE)
HOOD PEEL AWAY FACE SHEILD DIS (HOOD) ×5 IMPLANT
KIT BASIN OR (CUSTOM PROCEDURE TRAY) ×2 IMPLANT
KIT ROOM TURNOVER OR (KITS) ×2 IMPLANT
MANIFOLD NEPTUNE II (INSTRUMENTS) ×2 IMPLANT
NDL HYPO 25GX1X1/2 BEV (NEEDLE) ×1 IMPLANT
NEEDLE HYPO 25GX1X1/2 BEV (NEEDLE) ×2 IMPLANT
NS IRRIG 1000ML POUR BTL (IV SOLUTION) ×2 IMPLANT
PACK TOTAL JOINT (CUSTOM PROCEDURE TRAY) ×2 IMPLANT
PAD ARMBOARD 7.5X6 YLW CONV (MISCELLANEOUS) ×4 IMPLANT
PILLOW ABDUCTION HIP (SOFTGOODS) ×2 IMPLANT
PRESSURIZER FEMORAL UNIV (MISCELLANEOUS) IMPLANT
RETRIEVER SUT HEWSON (MISCELLANEOUS) ×2 IMPLANT
SET HNDPC FAN SPRY TIP SCT (DISPOSABLE) IMPLANT
SPONGE GAUZE 4X4 12PLY (GAUZE/BANDAGES/DRESSINGS) ×2 IMPLANT
SPONGE LAP 4X18 X RAY DECT (DISPOSABLE) IMPLANT
STRIP CLOSURE SKIN 1/2X4 (GAUZE/BANDAGES/DRESSINGS) ×4 IMPLANT
SUCTION FRAZIER TIP 10 FR DISP (SUCTIONS) ×2 IMPLANT
SUT FIBERWIRE #2 38 REV NDL BL (SUTURE) ×6
SUT FIBERWIRE #2 38 T-5 BLUE (SUTURE)
SUT MNCRL AB 4-0 PS2 18 (SUTURE) ×1 IMPLANT
SUT VIC AB 0 CT1 27 (SUTURE) ×2
SUT VIC AB 0 CT1 27XBRD ANBCTR (SUTURE) ×1 IMPLANT
SUT VIC AB 1 CT1 27 (SUTURE) ×2
SUT VIC AB 1 CT1 27XBRD ANBCTR (SUTURE) IMPLANT
SUT VIC AB 2-0 CT1 27 (SUTURE) ×2
SUT VIC AB 2-0 CT1 TAPERPNT 27 (SUTURE) ×1 IMPLANT
SUT VIC AB 3-0 SH 18 (SUTURE) ×2 IMPLANT
SUTURE FIBERWR #2 38 T-5 BLUE (SUTURE) IMPLANT
SUTURE FIBERWR#2 38 REV NDL BL (SUTURE) ×3 IMPLANT
SYR CONTROL 10ML LL (SYRINGE) ×2 IMPLANT
TOWEL OR 17X24 6PK STRL BLUE (TOWEL DISPOSABLE) ×2 IMPLANT
TOWEL OR 17X26 10 PK STRL BLUE (TOWEL DISPOSABLE) ×2 IMPLANT
TOWER CARTRIDGE SMART MIX (DISPOSABLE) IMPLANT
TRAY FOLEY CATH 14FR (SET/KITS/TRAYS/PACK) ×2 IMPLANT
WATER STERILE IRR 1000ML POUR (IV SOLUTION) ×8 IMPLANT

## 2012-10-18 NOTE — Preoperative (Signed)
Beta Blockers   Reason not to administer Beta Blockers:Not Applicable 

## 2012-10-18 NOTE — Anesthesia Postprocedure Evaluation (Signed)
Anesthesia Post Note  Patient: Denise Lane  Procedure(s) Performed: Procedure(s) (LRB): TOTAL HIP ARTHROPLASTY (Right)  Anesthesia type: general  Patient location: PACU  Post pain: Pain level controlled  Post assessment: Patient's Cardiovascular Status Stable  Last Vitals:  Filed Vitals:   10/18/12 1715  BP: 147/54  Pulse: 65  Temp:   Resp:     Post vital signs: Reviewed and stable  Level of consciousness: sedated  Complications: No apparent anesthesia complications

## 2012-10-18 NOTE — Anesthesia Preprocedure Evaluation (Addendum)
Anesthesia Evaluation  Patient identified by MRN, date of birth, ID band Patient awake    Reviewed: Allergy & Precautions, H&P , NPO status , Patient's Chart, lab work & pertinent test results  History of Anesthesia Complications (+) PONV  Airway Mallampati: I TM Distance: >3 FB Neck ROM: Full    Dental  (+) Teeth Intact and Dental Advisory Given   Pulmonary  breath sounds clear to auscultation        Cardiovascular hypertension, On Medications Rhythm:Regular Rate:Normal     Neuro/Psych    GI/Hepatic hiatal hernia, PUD, GERD-  ,  Endo/Other    Renal/GU      Musculoskeletal   Abdominal   Peds  Hematology   Anesthesia Other Findings   Reproductive/Obstetrics                          Anesthesia Physical Anesthesia Plan  ASA: III  Anesthesia Plan: General   Post-op Pain Management:    Induction: Intravenous  Airway Management Planned: Oral ETT  Additional Equipment:   Intra-op Plan:   Post-operative Plan: Extubation in OR  Informed Consent: I have reviewed the patients History and Physical, chart, labs and discussed the procedure including the risks, benefits and alternatives for the proposed anesthesia with the patient or authorized representative who has indicated his/her understanding and acceptance.   Dental advisory given  Plan Discussed with: CRNA and Surgeon  Anesthesia Plan Comments: (DJD R. Hip Hemochromatosis now controlled H/H 12.8/37 Htn GERD  Plan GA with oral ETT  Kipp Brood, MD)     Anesthesia Quick Evaluation

## 2012-10-18 NOTE — Progress Notes (Signed)
ANTICOAGULATION CONSULT NOTE - Initial Consult  Pharmacy Consult for warfarin Indication: VTE prophylaxis  Allergies  Allergen Reactions  . Codeine   . Dilaudid (Hydromorphone Hcl)   . Macrodantin   . Morphine And Related   . Sulfa Antibiotics     Patient Measurements:   Weight: 55 kg  Vital Signs: Temp: 98.3 F (36.8 C) (01/07 2013) Temp src: Oral (01/07 2013) BP: 139/51 mmHg (01/07 2013) Pulse Rate: 90  (01/07 2013)  Labs: No results found for this basename: HGB:2,HCT:3,PLT:3,APTT:3,LABPROT:3,INR:3,HEPARINUNFRC:3,CREATININE:3,CKTOTAL:3,CKMB:3,TROPONINI:3 in the last 72 hours  The CrCl is unknown because both a height and weight (above a minimum accepted value) are required for this calculation. INR 1.01 on 10/14/12  Medical History: Past Medical History  Diagnosis Date  . Hemochromatosis DIAGNOSED IN 1987  . Stenosis of esophagus   . Osteoarthritis   . Dysphagia   . Muscle pain   . History of colonic polyps     NOTED 11-20-09 IN COLONOSCOPY REPORT  . Diverticulosis of sigmoid colon 11-20-2009  . Hemorrhoids, internal 11-20-09  . Hyperlipidemia     no meds required  . Tremor, essential 10-29-09  . Hemochromatosis   . PONV (postoperative nausea and vomiting)   . Hypertension     takes Metoprolol daily  . History of bronchitis   . History of blood transfusion   . Occasional tremors   . Joint pain   . Joint swelling   . Osteoarthritis   . Back pain   . Bruises easily   . GERD (gastroesophageal reflux disease)     takes OMeprazole daily  . H/O hiatal hernia   . Gastric ulcer   . Vomiting     occasionally  . Urinary urgency     takes Ditropan daily  . History of kidney stones   . Osteoporosis   . Dry eyes     uses Refresh eye drops  . Depression     but doesn't reqire meds  . Rheumatic fever     hx of  . Osteoarthritis of right hip 10/18/2012    Medications:  Prescriptions prior to admission  Medication Sig Dispense Refill  . acetaminophen (TYLENOL)  650 MG CR tablet Take 650 mg by mouth every 8 (eight) hours as needed.      . calcium citrate (CALCITRATE - DOSED IN MG ELEMENTAL CALCIUM) 950 MG tablet Take 1 tablet by mouth daily.      . carboxymethylcellulose (REFRESH PLUS) 0.5 % SOLN Place 1 drop into both eyes 3 (three) times daily as needed. For dry eyes      . Cholecalciferol 10000 UNITS CAPS Take 1 capsule by mouth 2 (two) times daily.      Marland Kitchen gabapentin (NEURONTIN) 100 MG capsule Take 100 mg by mouth 4 (four) times daily.      . metoprolol tartrate (LOPRESSOR) 25 MG tablet Take 25 mg by mouth daily.      Marland Kitchen omega-3 acid ethyl esters (LOVAZA) 1 G capsule Take 1 g by mouth 3 (three) times daily.      Marland Kitchen omeprazole (PRILOSEC) 20 MG capsule Take 20 mg by mouth daily.      Marland Kitchen oxybutynin (DITROPAN) 5 MG tablet Take 5 mg by mouth 2 (two) times daily.       . raloxifene (EVISTA) 60 MG tablet Take 60 mg by mouth daily.      . traMADol (ULTRAM) 50 MG tablet Take 50 mg by mouth every 6 (six) hours as needed. For pain      . [  DISCONTINUED] meloxicam (MOBIC) 15 MG tablet Take 15 mg by mouth daily.        Assessment: 79 year old woman s/p right THA to receive warfarin post op for VTE prophylaxis. Patient also on Lovenox 30mg  sq daily until INR >= 1.8.  Goal of Therapy:  INR 2-3    Plan:  Warfarin 5mg  x 1 Daily protimes Dispense warfarin education materials and order video. Continue Lovenox until INR >=1.8  Mickeal Skinner 10/18/2012,8:23 PM

## 2012-10-18 NOTE — Progress Notes (Signed)
Notified allison of PTT 42. NO NEW ORDERS GIVEN.

## 2012-10-18 NOTE — Progress Notes (Addendum)
SPOKE WITH April  IN OR ROOM WITH DR. LANDAU TO MAKE HIm AWARE WE NEED ORDERS FOR CONSENT AND ANTIBIOTIC.

## 2012-10-18 NOTE — Anesthesia Procedure Notes (Signed)
Procedure Name: Intubation Date/Time: 10/18/2012 12:48 PM Performed by: Gayla Medicus Pre-anesthesia Checklist: Patient identified, Timeout performed, Emergency Drugs available, Suction available and Patient being monitored Patient Re-evaluated:Patient Re-evaluated prior to inductionOxygen Delivery Method: Circle system utilized Preoxygenation: Pre-oxygenation with 100% oxygen Intubation Type: IV induction Ventilation: Mask ventilation without difficulty Laryngoscope Size: Mac and 3 Grade View: Grade I Tube type: Oral Tube size: 7.5 mm Number of attempts: 1 Airway Equipment and Method: Stylet Placement Confirmation: ETT inserted through vocal cords under direct vision,  positive ETCO2 and breath sounds checked- equal and bilateral Secured at: 22 cm Tube secured with: Tape Dental Injury: Teeth and Oropharynx as per pre-operative assessment

## 2012-10-18 NOTE — Progress Notes (Signed)
Pt arrived with an abduction pillow

## 2012-10-18 NOTE — Op Note (Signed)
10/18/2012  2:11 PM  PATIENT:  Denise Lane   MRN: [id number]  PRE-OPERATIVE DIAGNOSIS:  DJD RIGHT HIP  POST-OPERATIVE DIAGNOSIS:  DJD RIGHT HIP  PROCEDURE:  Procedure(s): TOTAL HIP ARTHROPLASTY  PREOPERATIVE INDICATIONS:    Denise Lane is an 79 y.o. female who has a diagnosis of Osteoarthritis of right hip and elected for surgical management after failing conservative treatment.  The risks benefits and alternatives were discussed with the patient including but not limited to the risks of nonoperative treatment, versus surgical intervention including infection, bleeding, nerve injury, periprosthetic fracture, the need for revision surgery, dislocation, leg length discrepancy, blood clots, cardiopulmonary complications, morbidity, mortality, among others, and they were willing to proceed.     OPERATIVE REPORT     SURGEON:  Teryl Lucy, MD    ASSISTANT:  Janace Litten, OPA-C  (Present throughout the entire procedure,  necessary for completion of procedure in a timely manner, assisting with retraction, instrumentation, and closure)     ANESTHESIA:  General    COMPLICATIONS:  None.     COMPONENTS:  [employer] fit femur size 6 with a 36 mm +1.5 head ball and a pinnacle acetabular shell size 52 with a 10 degree lipped polyethylene liner    PROCEDURE IN DETAIL:   The patient was met in the holding area and  identified.  The appropriate hip was identified and marked at the operative site.  The patient was then transported to the OR  and  placed under general anesthesia.  At that point, the patient was  placed in the lateral decubitus position with the operative side up and  secured to the operating room table and all bony prominences padded.     The operative lower extremity was prepped from the iliac crest to the distal leg.  Sterile draping was performed.  Time out was performed prior to incision.      A routine posterolateral approach was utilized via sharp  dissection  carried down to the subcutaneous tissue.  Gross bleeders were Bovie coagulated.  The iliotibial band was identified and incised along the length of the skin incision.  Self-retaining retractors were  inserted.  With the hip internally rotated, the short external rotators  were identified. The piriformis and capsule was tagged with FiberWire, and the hip capsule released in a T-type fashion.  The femoral neck was exposed, and I resected the femoral neck using the appropriate jig. This was performed at approximately a thumb's breadth above the lesser trochanter.    I then exposed the deep acetabulum, cleared out any tissue including the ligamentum teres.  A wing retractor was placed.  After adequate visualization, I excised the labrum, and then sequentially reamed.  I placed the trial acetabulum, which seated nicely, and then impacted the real cup into place.  Appropriate version and inclination was confirmed clinically matching their bony anatomy, and also with the use of the jig.  A trial polyethylene liner was placed and the wing retractor removed.    I then prepared the proximal femur using the cookie-cutter, the lateralizing reamer, and then sequentially reamed and broached.  A trial broach, neck, and head was utilized, and I reduced the hip and it was found to have excellent stability with functional range of motion. The trial components were then removed, and the real polyethylene liner was placed with the lip directed posteriorly.  I then impacted the real femoral prosthesis into place into the appropriate version, slightly anteverted to the normal anatomy,  and I impacted the real head ball into place. The hip was then reduced and taken through functional range of motion and found to have excellent stability. Leg lengths were restored.  I then used a 2 mm drill bits to pass the FiberWire suture from the capsule and puriform is through the greater trochanter, and secured this.  Excellent posterior capsular repair was achieved. I also closed the T in the capsule.  I then irrigated the hip copiously again with pulse lavage, and repaired the fascia with Vicryl, followed by Vicryl for the subcutaneous tissue, Monocryl for the skin, Steri-Strips and sterile gauze. The wounds were injected. The patient was then awakened and returned to PACU in stable and satisfactory condition. There were no complications.  Teryl Lucy, MD Orthopedic Surgeon [phone]   10/18/2012 2:11 PM

## 2012-10-18 NOTE — Transfer of Care (Signed)
Immediate Anesthesia Transfer of Care Note  Patient: Denise Lane  Procedure(s) Performed: Procedure(s) (LRB) with comments: TOTAL HIP ARTHROPLASTY (Right)  Patient Location: PACU  Anesthesia Type:General  Level of Consciousness: awake, alert  and oriented  Airway & Oxygen Therapy: Patient Spontanous Breathing and Patient connected to face mask oxygen  Post-op Assessment: Report given to PACU RN and Post -op Vital signs reviewed and stable  Post vital signs: Reviewed and stable  Complications: No apparent anesthesia complications

## 2012-10-18 NOTE — Progress Notes (Signed)
Dr Katrinka Blazing called informed of patient's pain unrelieved may give give 4mcg/ml of fentanyl

## 2012-10-18 NOTE — H&P (Signed)
PREOPERATIVE H&Lane  Chief Complaint: DJD RIGHT HIP  HPI: Denise Lane is a 79 y.o. female who presents for preoperative history and physical with a diagnosis of DJD RIGHT HIP. Symptoms are rated as moderate to severe, and have been worsening.  This is significantly impairing activities of daily living.  She has elected for surgical management. She has failed conservative measures including anti-inflammatories, which she did not tolerate, as well as activity modification, the use of a cane, and reports loss of motion and ongoing pain. This is rated as 10/10.   Past Medical History  Diagnosis Date  . Hemochromatosis DIAGNOSED IN 1987  . Stenosis of esophagus   . Osteoarthritis   . Dysphagia   . Muscle pain   . History of colonic polyps     NOTED 11-20-09 IN COLONOSCOPY REPORT  . Diverticulosis of sigmoid colon 11-20-2009  . Hemorrhoids, internal 11-20-09  . Hyperlipidemia     no meds required  . Tremor, essential 10-29-09  . Hemochromatosis   . PONV (postoperative nausea and vomiting)   . Hypertension     takes Metoprolol daily  . History of bronchitis   . History of blood transfusion   . Occasional tremors   . Joint pain   . Joint swelling   . Osteoarthritis   . Back pain   . Bruises easily   . GERD (gastroesophageal reflux disease)     takes OMeprazole daily  . H/O hiatal hernia   . Gastric ulcer   . Vomiting     occasionally  . Urinary urgency     takes Ditropan daily  . History of kidney stones   . Osteoporosis   . Dry eyes     uses Refresh eye drops  . Depression     but doesn't reqire meds  . Rheumatic fever     hx of   Past Surgical History  Procedure Date  . Tonsillectomy   . Appendectomy   . Cholecystectomy   . Tubal ligation   . Back surgery 1989  . Left shoulder surgery   . Dilation and curettage of uterus 1975  . Cataract extraction bilateral w/ anterior vitrectomy   . Esophagogastroduodenoscopy   . Colonoscopy    History   Social History  .  Marital Status: Married    Spouse Name: N/A    Number of Children: N/A  . Years of Education: N/A   Social History Main Topics  . Smoking status: Never Smoker   . Smokeless tobacco: None  . Alcohol Use: No  . Drug Use: No  . Sexually Active: Not Currently    Birth Control/ Protection: Post-menopausal   Other Topics Concern  . None   Social History Narrative  . None   History reviewed. No pertinent family history. Allergies  Allergen Reactions  . Codeine   . Dilaudid (Hydromorphone Hcl)   . Macrodantin   . Morphine And Related   . Sulfa Antibiotics    Prior to Admission medications   Medication Sig Start Date End Date Taking? Authorizing Provider  acetaminophen (TYLENOL) 650 MG CR tablet Take 650 mg by mouth every 8 (eight) hours as needed.   Yes Historical Provider, MD  calcium citrate (CALCITRATE - DOSED IN MG ELEMENTAL CALCIUM) 950 MG tablet Take 1 tablet by mouth daily.   Yes Historical Provider, MD  carboxymethylcellulose (REFRESH PLUS) 0.5 % SOLN Place 1 drop into both eyes 3 (three) times daily as needed. For dry eyes   Yes Historical Provider,  MD  Cholecalciferol 10000 UNITS CAPS Take 1 capsule by mouth 2 (two) times daily.   Yes Historical Provider, MD  gabapentin (NEURONTIN) 100 MG capsule Take 100 mg by mouth 4 (four) times daily.   Yes Historical Provider, MD  meloxicam (MOBIC) 15 MG tablet Take 15 mg by mouth daily.   Yes Kimber Relic, MD  metoprolol tartrate (LOPRESSOR) 25 MG tablet Take 25 mg by mouth daily.   Yes Historical Provider, MD  omega-3 acid ethyl esters (LOVAZA) 1 G capsule Take 1 g by mouth 3 (three) times daily.   Yes Historical Provider, MD  omeprazole (PRILOSEC) 20 MG capsule Take 20 mg by mouth daily. 11/15/09  Yes Vertell Novak., MD  oxybutynin (DITROPAN) 5 MG tablet Take 5 mg by mouth 2 (two) times daily.  10/29/11  Yes Williemae Area  raloxifene (EVISTA) 60 MG tablet Take 60 mg by mouth daily.   Yes Candace Darolyn Rua, MD  traMADol  (ULTRAM) 50 MG tablet Take 50 mg by mouth every 6 (six) hours as needed. For pain   Yes Historical Provider, MD     Positive ROS: All other systems have been reviewed and were otherwise negative with the exception of those mentioned in the HPI and as above.  Physical Exam: General: Alert, no acute distress Cardiovascular: No pedal edema Respiratory: No cyanosis, no use of accessory musculature GI: No organomegaly, abdomen is soft and non-tender Skin: No lesions in the area of chief complaint Neurologic: Sensation intact distally Psychiatric: Patient is competent for consent with normal mood and affect Lymphatic: No axillary or cervical lymphadenopathy  MUSCULOSKELETAL her right hip has severe loss of motion, external rotation is to 10 at most. EHL and FHL are firing.:  Assessment: DJD RIGHT HIP  Plan: Plan for Procedure(s): TOTAL HIP ARTHROPLASTY  The risks benefits and alternatives were discussed with the patient including but not limited to the risks of nonoperative treatment, versus surgical intervention including infection, bleeding, nerve injury, periprosthetic fracture, the need for revision surgery, dislocation, leg length discrepancy, blood clots, cardiopulmonary complications, morbidity, mortality, among others, and they were willing to proceed.       Denise Rapaport P, MD Cell [phone] Pager [phone]  10/18/2012 11:38 AM

## 2012-10-19 ENCOUNTER — Encounter (HOSPITAL_COMMUNITY): Payer: Self-pay | Admitting: Orthopedic Surgery

## 2012-10-19 LAB — CBC
Hemoglobin: 9.9 g/dL — ABNORMAL LOW (ref 12.0–15.0)
MCH: 33.1 pg (ref 26.0–34.0)
MCHC: 35.2 g/dL (ref 30.0–36.0)
MCV: 94 fL (ref 78.0–100.0)
Platelets: 229 10*3/uL (ref 150–400)

## 2012-10-19 LAB — BASIC METABOLIC PANEL
CO2: 27 mEq/L (ref 19–32)
Calcium: 8.7 mg/dL (ref 8.4–10.5)
Creatinine, Ser: 0.64 mg/dL (ref 0.50–1.10)
GFR calc non Af Amer: 82 mL/min — ABNORMAL LOW (ref >90)
Glucose, Bld: 128 mg/dL — ABNORMAL HIGH (ref 70–99)
Sodium: 135 mEq/L (ref 135–145)

## 2012-10-19 LAB — PROTIME-INR
INR: 1.22 (ref 0.00–1.49)
Prothrombin Time: 15.2 seconds (ref 11.6–15.2)

## 2012-10-19 MED ORDER — WARFARIN SODIUM 3 MG PO TABS
3.0000 mg | ORAL_TABLET | Freq: Once | ORAL | Status: AC
  Administered 2012-10-19: 3 mg via ORAL
  Filled 2012-10-19: qty 1

## 2012-10-19 MED ORDER — PANTOPRAZOLE SODIUM 40 MG PO TBEC
40.0000 mg | DELAYED_RELEASE_TABLET | Freq: Every day | ORAL | Status: DC
  Administered 2012-10-20: 40 mg via ORAL
  Filled 2012-10-19: qty 1

## 2012-10-19 MED ORDER — VITAMIN D3 25 MCG (1000 UNIT) PO TABS
1000.0000 [IU] | ORAL_TABLET | Freq: Two times a day (BID) | ORAL | Status: DC
  Administered 2012-10-19 – 2012-10-21 (×5): 1000 [IU] via ORAL
  Filled 2012-10-19 (×6): qty 1

## 2012-10-19 NOTE — Evaluation (Signed)
Physical Therapy Evaluation Patient Details Name: Denise Lane MRN: [id number] DOB: May 01, 1930 Today's Date: 10/19/2012 Time: 8469-6295 PT Time Calculation (min): 53 min  PT Assessment / Plan / Recommendation Clinical Impression  Patient is an 79 y.o. female admitted for right THA.  PMH positive for hematomachrosis, left shoulder repair, back surgery, colon polyps, gastric ulcer and postop nausea and vomiting.  She presents with decreased independence with mobility due to acute pain, decreased knowledge of precautions, decreased AROM and strength right LE and decreased activity tolerance.  She will benefit from skilled PT to maximize independence and allow d/c home with family assist.    PT Assessment  Patient needs continued PT services    Follow Up Recommendations  Home health PT;Supervision/Assistance - 24 hour                Equipment Recommendations  Rolling walker with 5" wheels         Frequency 7X/week    Precautions / Restrictions Precautions Precautions: Posterior Hip Restrictions Other Position/Activity Restrictions: WBAT right LE   Pertinent Vitals/Pain Denies pain at rest, slight increase with mobility; was medicated by RN prior to OOB      Mobility  Bed Mobility Bed Mobility: Supine to Sit;Sit to Supine;Sitting - Scoot to Edge of Bed Supine to Sit: 3: Mod assist;HOB flat Sitting - Scoot to [place] of Bed: 4: Min assist Details for Bed Mobility Assistance: cues for technique and assist for rigtht LE Transfers Transfers: Sit to Stand;Stand to Sit Sit to Stand: 3: Mod assist;With upper extremity assist;From bed Stand to Sit: 4: Min assist;With armrests;To chair/3-in-1;With upper extremity assist Details for Transfer Assistance: cues for technique and hip precautions.   Ambulation/Gait Ambulation/Gait Assistance: 4: Min assist Ambulation Distance (Feet): 40 Feet Assistive device: Rolling walker Ambulation/Gait Assistance Details: with chair following (h/o  orthostasis after surgery)  Slow and cues for sequence, walker management Gait Pattern: Step-to pattern;Antalgic         Exercises Total Joint Exercises Ankle Circles/Pumps: AROM;Both;10 reps;Supine Quad Sets: AROM;Right;10 reps;Supine   PT Diagnosis: Abnormality of gait;Generalized weakness;Acute pain  PT Problem List: Decreased strength;Decreased range of motion;Decreased knowledge of use of DME;Decreased activity tolerance;Decreased mobility;Pain;Decreased knowledge of precautions PT Treatment Interventions: DME instruction;Gait training;Stair training;Functional mobility training;Patient/family education;Therapeutic activities;Therapeutic exercise;Balance training   PT Goals Acute Rehab PT Goals PT Goal Formulation: With patient Time For Goal Achievement: 10/24/12 Potential to Achieve Goals: Good Pt will go Supine/Side to Sit: with supervision PT Goal: Supine/Side to Sit - Progress: Goal set today Pt will go Sit to Supine/Side: with supervision PT Goal: Sit to Supine/Side - Progress: Goal set today Pt will go Sit to Stand: with modified independence PT Goal: Sit to Stand - Progress: Goal set today Pt will go Stand to Sit: with modified independence PT Goal: Stand to Sit - Progress: Goal set today Pt will Ambulate: 51 - 150 feet;with least restrictive assistive device;with modified independence PT Goal: Ambulate - Progress: Goal set today Pt will Go Up / Down Stairs: 3-5 stairs;with rail(s);with least restrictive assistive device;with min assist PT Goal: Up/Down Stairs - Progress: Goal set today Pt will Perform Home Exercise Program: with supervision, verbal cues required/provided PT Goal: Perform Home Exercise Program - Progress: Goal set today  Visit Information  Last PT Received On: 10/19/12 Assistance Needed: +1    Subjective Data  Subjective: The therapy for my rotator cuff was the worst! Patient Stated Goal: To go home with family assist   Prior Functioning  Home  Living Lives With: Spouse Available Help at Discharge: Family Type of Home: House Home Access: Stairs to enter Secretary/administrator of Steps: 4 Entrance Stairs-Rails: Left;Right Home Layout: One level Bathroom Shower/Tub: Tub/shower unit (has been sponge bathing due to difficulty getting in ) Firefighter: Standard Home Adaptive Equipment: Hand-held shower hose;Quad cane Additional Comments: borrowed quad cane from neighbor Prior Function Level of Independence: Independent with assistive device(s) Able to Take Stairs?: Yes Driving: Yes 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 Musician: No difficulties    Cognition  Overall Cognitive Status: Appears within functional limits for tasks assessed/performed Arousal/Alertness: Awake/alert Orientation Level: Appears intact for tasks assessed Behavior During Session: [facility] for tasks performed    Extremity/Trunk Assessment Right Upper Extremity Assessment RUE ROM/Strength/Tone: [facility] for tasks assessed (hand joint deformities due to arthritis) Left Upper Extremity Assessment LUE ROM/Strength/Tone: WFL for tasks assessed (hand joint deformities due to arthrities) Right Lower Extremity Assessment RLE ROM/Strength/Tone: Deficits;Due to pain RLE ROM/Strength/Tone Deficits: ankle AROM WFL, activates quads isometrically, AAROM grossly 60 degrees RLE Sensation: WFL - Light Touch Left Lower Extremity Assessment LLE ROM/Strength/Tone: WFL for tasks assessed LLE Sensation: WFL - Light Touch Trunk Assessment Trunk Assessment: Kyphotic       End of Session PT - End of Session Equipment Utilized During Treatment: Gait belt Activity Tolerance: Patient tolerated treatment well Patient left: in chair;with call bell/phone within reach;with family/visitor present  GP     [facility] 10/19/2012, 10:44 AM  Sheran Lawless, PT [phone] 10/19/2012

## 2012-10-19 NOTE — Progress Notes (Signed)
Patient ID: Denise Lane, female   DOB: Jun 14, 1930, 79 y.o.   MRN: [id number]     Subjective:  Patient reports pain as mild to moderate.she is worried about PT and not being able to get around. Objective:   VITALS:   Filed Vitals:   10/18/12 2222 10/18/12 2300 10/19/12 0300 10/19/12 0602  BP: 138/49 134/45 139/52 162/50  Pulse: 76 68 74 70  Temp: 98.4 F (36.9 C) 98 F (36.7 C) 98 F (36.7 C) 98.2 F (36.8 C)  TempSrc: Oral Oral Oral Oral  Resp: 18 18 18 19   SpO2: 99% 100% 100% 99%    ABD soft Sensation intact distally Dorsiflexion/Plantar flexion intact Incision: dressing C/D/I and scant drainage  LABS  Results for orders placed during the hospital encounter of 10/18/12 (from the past 24 hour(s))  CBC     Status: Abnormal   Collection Time   10/18/12  8:39 PM      Component Value Range   WBC 11.9 (*) 4.0 - 10.5 K/uL   RBC 3.28 (*) 3.87 - 5.11 MIL/uL   Hemoglobin 10.8 (*) 12.0 - 15.0 g/dL   HCT 40.9 (*) 81.1 - 91.4 %   MCV 93.3  78.0 - 100.0 fL   MCH 32.9  26.0 - 34.0 pg   MCHC 35.3  30.0 - 36.0 g/dL   RDW 78.2  95.6 - 21.3 %   Platelets 254  150 - 400 K/uL  CREATININE, SERUM     Status: Abnormal   Collection Time   10/18/12  8:39 PM      Component Value Range   Creatinine, Ser 0.66  0.50 - 1.10 mg/dL   GFR calc non Af Amer 81 (*) >90 mL/min   GFR calc Af Amer >90  >90 mL/min  PROTIME-INR     Status: Normal   Collection Time   10/19/12  5:30 AM      Component Value Range   Prothrombin Time 15.2  11.6 - 15.2 seconds   INR 1.22  0.00 - 1.49  CBC     Status: Abnormal   Collection Time   10/19/12  5:30 AM      Component Value Range   WBC 11.0 (*) 4.0 - 10.5 K/uL   RBC 2.99 (*) 3.87 - 5.11 MIL/uL   Hemoglobin 9.9 (*) 12.0 - 15.0 g/dL   HCT 08.6 (*) 57.8 - 46.9 %   MCV 94.0  78.0 - 100.0 fL   MCH 33.1  26.0 - 34.0 pg   MCHC 35.2  30.0 - 36.0 g/dL   RDW 62.9  52.8 - 41.3 %   Platelets 229  150 - 400 K/uL  BASIC METABOLIC PANEL     Status: Abnormal   Collection Time   10/19/12  5:30 AM      Component Value Range   Sodium 135  135 - 145 mEq/L   Potassium 3.5  3.5 - 5.1 mEq/L   Chloride 98  96 - 112 mEq/L   CO2 27  19 - 32 mEq/L   Glucose, Bld 128 (*) 70 - 99 mg/dL   BUN 12  6 - 23 mg/dL   Creatinine, Ser 2.44  0.50 - 1.10 mg/dL   Calcium 8.7  8.4 - 01.0 mg/dL   GFR calc non Af Amer 82 (*) >90 mL/min   GFR calc Af Amer >90  >90 mL/min    Dg Pelvis Portable  10/18/2012  *RADIOLOGY REPORT*  Clinical Data: Postop right hip replacement.  PORTABLE  PELVIS  Comparison: None.  Findings: Changes of right hip replacement.  No hardware or bony complicating feature.  Normal AP alignment.  Degenerative changes within the left hip.  IMPRESSION: Right hip replacement.  No complicating feature.   Original Report Authenticated By: Charlett Nose, M.D.    Dg Hip Portable 1 View Right  10/18/2012  *RADIOLOGY REPORT*  Clinical Data: Postop right total hip replacement.  PORTABLE RIGHT HIP - 1 VIEW  Comparison: AP pelvic radiograph - earlier same day  Findings:  This examination was interpreted with AP pelvic radiograph performed earlier same day.  Post right total hip replacement without evidence of hardware failure or loosening.  No definite fracture.  There is a minimal amount of expected subcutaneous emphysema about the operative site.  No radiopaque foreign body.  Mild degenerative change of the contralateral left hip is suspected with minimal axial migration, joint space loss and subchondral sclerosis.  IMPRESSION: Post right total hip replacement without complicating feature.   Original Report Authenticated By: Tacey Ruiz, MD     Assessment/Plan: 1 Day Post-Op   Principal Problem:  *Osteoarthritis of right hip   Advance diet Up with therapy Continue SCD's DC abduction pillow ABLA continue to monitor.   Haskel Khan 10/19/2012, 8:35 AM   Teryl Lucy, MD Cell [phone] Pager [phone]

## 2012-10-19 NOTE — Progress Notes (Signed)
OT Cancellation Note  Patient Details Name: Denise Lane MRN: [id number] DOB: [birth year]   Cancelled Treatment:    Reason Eval/Treat Not Completed: Fatigue limiting ability to participate. Will check back tomorrow.    [address] OTR/L [phone] 10/19/2012 10/19/2012, 2:53 PM

## 2012-10-19 NOTE — Progress Notes (Signed)
ANTICOAGULATION CONSULT NOTE - Follow Up Consult  Pharmacy Consult for Coumadin Indication: VTE prophylaxis s/p R THA  Allergies  Allergen Reactions  . Codeine Nausea And Vomiting  . Dilaudid (Hydromorphone Hcl) Hives and Other (See Comments)    "whelps" (10/18/2012)  . Macrodantin Hives and Other (See Comments)    "drug fever; chills; felt terrible" (10/18/2012)  . Morphine And Related Hives and Nausea And Vomiting  . Sulfa Antibiotics Hives, Rash and Other (See Comments)    "got real sick" (10/18/2012)    Patient Measurements:     Vital Signs: Temp: 98.2 F (36.8 C) (01/08 0602) Temp src: Oral (01/08 0602) BP: 162/50 mmHg (01/08 0602) Pulse Rate: 70  (01/08 0602)  Labs:  Basename 10/19/12 0530 10/18/12 2039  HGB 9.9* 10.8*  HCT 28.1* 30.6*  PLT 229 254  APTT -- --  LABPROT 15.2 --  INR 1.22 --  HEPARINUNFRC -- --  CREATININE 0.64 0.66  CKTOTAL -- --  CKMB -- --  TROPONINI -- --    The CrCl is unknown because both a height and weight (above a minimum accepted value) are required for this calculation.   Medications:  Scheduled:    . [EXPIRED] acetaminophen      . acetaminophen  1,000 mg Intravenous Q6H  . calcium citrate  1 tablet Oral Daily  . [COMPLETED]  ceFAZolin (ANCEF) IV  1 g Intravenous Q6H  . cholecalciferol  1,000 Units Oral BID  . docusate sodium  100 mg Oral BID  . enoxaparin (LOVENOX) injection  30 mg Subcutaneous Q24H  . [EXPIRED] fentaNYL      . gabapentin  100 mg Oral QID  . ketorolac  15 mg Intravenous Q6H  . [COMPLETED] methocarbamol (ROBAXIN) IV  500 mg Intravenous To PACU  . metoprolol tartrate  25 mg Oral Daily  . omega-3 acid ethyl esters  1 g Oral TID  . oxybutynin  5 mg Oral BID  . pantoprazole  40 mg Oral Daily  . [COMPLETED] patient's guide to using coumadin book   Does not apply Once  . senna  1 tablet Oral BID  . [COMPLETED] warfarin  5 mg Oral ONCE-1800  . [COMPLETED] warfarin   Does not apply Once  . Warfarin - Pharmacist  Dosing Inpatient   Does not apply q1800  . [DISCONTINUED] acetaminophen  1,000 mg Intravenous Once  . [DISCONTINUED]  ceFAZolin (ANCEF) IV  2 g Intravenous On Call to OR  . [DISCONTINUED] Cholecalciferol  1 capsule Oral BID  . [DISCONTINUED] fentaNYL  50 mcg Intravenous Once    Assessment: 79 yo F on Coumadin for post-op VTE prophylaxis.  INR has increased after 5mg  x 1 dose.  Based on age and likely sensitivity to Coumadin will reduce tonight's dose.  Goal of Therapy:  INR 2-3    Plan:  Coumadin 3 mg PO x 1 tonight Continue Lovenox until INR >= 1.8 Daily INR.  [employer], Pharm.D., BCPS Clinical Pharmacist Pager [phone] 10/19/2012 1:38 PM

## 2012-10-19 NOTE — Progress Notes (Signed)
Orthopedic Tech Progress Note Patient Details:  Denise Lane Denise Lane Feb 29, 1930 [id number]  Patient ID: Denise Lane, female   DOB: 07/20/1930, 79 y.o.   MRN: [id number] Trapeze bar patient helper Viewed order from doctor's order list Nikki Dom 10/19/2012, 2:26 PM

## 2012-10-19 NOTE — Progress Notes (Signed)
Physical Therapy Treatment Patient Details Name: Denise Lane MRN: [id number] DOB: 1930-02-15 Today's Date: 10/19/2012 Time: 7829-5621 PT Time Calculation (min): 29 min  PT Assessment / Plan / Recommendation Comments on Treatment Session  Progressing nicely with distance with gait.  Able to stand to wipe after toileting with cues to do so.  Feel she will be ready to practice stairs tomorrow.    Follow Up Recommendations  Home health PT;Supervision/Assistance - 24 hour                           Frequency 7X/week   Plan Discharge plan remains appropriate    Precautions / Restrictions Precautions Precautions: Posterior Hip;Fall Restrictions RLE Weight Bearing: Weight bearing as tolerated   Pertinent Vitals/Pain Min c/o soreness right hip only    Mobility  Bed Mobility Sit to Supine: 3: Mod assist;HOB flat Details for Bed Mobility Assistance: with cues for technique Transfers Sit to Stand: 4: Min assist;From chair/3-in-1;From bed;With armrests;With upper extremity assist Stand to Sit: 4: Min assist;To bed;Without upper extremity assist;To chair/3-in-1 Details for Transfer Assistance: up from chair; on/off 3:1 over toliet; onto bed Ambulation/Gait Ambulation/Gait Assistance: 4: Min guard Ambulation Distance (Feet): 80 Feet Assistive device: Rolling walker Ambulation/Gait Assistance Details: cues for posture, walker proximity, safety with turns Gait Pattern: Step-to pattern;Decreased stride length;Antalgic;Trunk flexed    Exercises Total Joint Exercises Heel Slides: AAROM;Both;10 reps;Seated     PT Goals Acute Rehab PT Goals Pt will go Sit to Supine/Side: with supervision PT Goal: Sit to Supine/Side - Progress: Progressing toward goal Pt will go Sit to Stand: with modified independence PT Goal: Sit to Stand - Progress: Progressing toward goal Pt will go Stand to Sit: with supervision PT Goal: Stand to Sit - Progress: Progressing toward goal Pt will Perform Home  Exercise Program: with supervision, verbal cues required/provided PT Goal: Perform Home Exercise Program - Progress: Progressing toward goal  Visit Information  Last PT Received On: 10/19/12    Subjective Data  Subjective: Been up so long bottom is sore.   Cognition  Overall Cognitive Status: Appears within functional limits for tasks assessed/performed Arousal/Alertness: Awake/alert Orientation Level: Appears intact for tasks assessed Behavior During Session: [facility] for tasks performed        End of Session PT - End of Session Equipment Utilized During Treatment: Gait belt Activity Tolerance: Patient tolerated treatment well Patient left: in bed;with call bell/phone within reach;with family/visitor present   GP     [facility] 10/19/2012, 2:54 PM Sheran Lawless, PT [phone] 10/19/2012

## 2012-10-19 NOTE — Progress Notes (Signed)
UR COMPLETED  

## 2012-10-19 NOTE — Progress Notes (Signed)
PT is recommending home with [facility]  PT-24 hour supervision/ assistance .  Patient's daughter reported that she and the patient's husband can provide 24 hour supervision. Clinical Social Worker will sign off for now as social work intervention is no longer needed. Please consult [place] again if new need arises.   Sabino Niemann, MSW [phone]

## 2012-10-19 NOTE — Progress Notes (Signed)
CARE MANAGEMENT NOTE 10/19/2012  Patient:  Denise Lane, Denise Lane   Account Number:  [id number]  Date Initiated:  10/19/2012  Documentation initiated by:  Vance Peper  Subjective/Objective Assessment:   78 yr old female s/p right total hip replacement.     Action/Plan:   CM spoke with patient concerning home health and DME needs at discharge.  Patient preoperatively setup with [facility], no changes. Rolling walker and 3in1 ordered.   Anticipated DC Date:  10/20/2012   Anticipated DC Plan:  HOME W HOME HEALTH SERVICES      DC Planning Services  CM consult      PAC Choice  DURABLE MEDICAL EQUIPMENT  HOME HEALTH   Choice offered to / List presented to:  C-1 Patient   DME arranged  3-N-1  Levan Hurst      DME agency  [facility]     HH arranged  HH-2 PT      [facility] agency  [facility]   Status of service:  Completed, signed off Medicare Important Message given?   (If response is "NO", the following Medicare IM given date fields will be blank) Date Medicare IM given:   Date Additional Medicare IM given:    Discharge Disposition:  HOME W HOME HEALTH SERVICES  Per UR Regulation:    If discussed at Long Length of Stay Meetings, dates discussed:    Comments:

## 2012-10-20 LAB — CBC
HCT: 26.6 % — ABNORMAL LOW (ref 36.0–46.0)
Hemoglobin: 9.2 g/dL — ABNORMAL LOW (ref 12.0–15.0)
MCH: 32.9 pg (ref 26.0–34.0)
MCHC: 34.6 g/dL (ref 30.0–36.0)
MCV: 95 fL (ref 78.0–100.0)
RBC: 2.8 MIL/uL — ABNORMAL LOW (ref 3.87–5.11)

## 2012-10-20 LAB — BASIC METABOLIC PANEL
BUN: 13 mg/dL (ref 6–23)
CO2: 29 mEq/L (ref 19–32)
Calcium: 8.6 mg/dL (ref 8.4–10.5)
Creatinine, Ser: 0.63 mg/dL (ref 0.50–1.10)
GFR calc non Af Amer: 82 mL/min — ABNORMAL LOW (ref >90)
Glucose, Bld: 115 mg/dL — ABNORMAL HIGH (ref 70–99)
Sodium: 136 mEq/L (ref 135–145)

## 2012-10-20 LAB — PROTIME-INR: Prothrombin Time: 24.1 seconds — ABNORMAL HIGH (ref 11.6–15.2)

## 2012-10-20 NOTE — Progress Notes (Signed)
ANTICOAGULATION CONSULT NOTE - Follow Up Consult  Pharmacy Consult for Coumadin Indication: VTE prophylaxis  Allergies  Allergen Reactions  . Codeine Nausea And Vomiting  . Dilaudid (Hydromorphone Hcl) Hives and Other (See Comments)    "whelps" (10/18/2012)  . Macrodantin Hives and Other (See Comments)    "drug fever; chills; felt terrible" (10/18/2012)  . Morphine And Related Hives and Nausea And Vomiting  . Sulfa Antibiotics Hives, Rash and Other (See Comments)    "got real sick" (10/18/2012)    Patient Measurements: Height: 5\' 6"  (167.6 cm) Weight: 120 lb (54.432 kg) IBW/kg (Calculated) : 59.3  Heparin Dosing Weight:   Vital Signs: Temp: 97.9 F (36.6 C) (01/09 0514) Temp src: Oral (01/09 0514) BP: 140/62 mmHg (01/09 0514) Pulse Rate: 76  (01/09 0514)  Labs:  Basename 10/20/12 0540 10/19/12 0530 10/18/12 2039  HGB 9.2* 9.9* --  HCT 26.6* 28.1* 30.6*  PLT 224 229 254  APTT -- -- --  LABPROT 24.1* 15.2 --  INR 2.28* 1.22 --  HEPARINUNFRC -- -- --  CREATININE 0.63 0.64 0.66  CKTOTAL -- -- --  CKMB -- -- --  TROPONINI -- -- --    Estimated Creatinine Clearance: 48.2 ml/min (by C-G formula based on Cr of 0.63).   Medications:  Lovenox 30mg  daily  Assessment: 82yof on Coumadin for VTE prophylaxis s/p THA. INR (2.28) is therapeutic but significantly increased (1.22-->2.28) despite decreased dose. Will plan to hold Coumadin tonight and restart at lower dose once INR stabilizes. Discontinue Lovenox now that INR >1.8 per MD order. - H/H and Plts trending down - No significant bleeding reported  Goal of Therapy:  INR 2-3   Plan:  1. Discontinue Lovenox with INR > 1.8 2. No Coumadin tonight 3. Follow-up AM INR and discharge plans  Cleon Dew [phone] 10/20/2012,2:19 PM

## 2012-10-20 NOTE — Progress Notes (Signed)
Patient ID: Denise Lane, female   DOB: 05-Aug-1930, 79 y.o.   MRN: [id number]     Subjective:  Patient reports pain as mild to moderate.  She states that she has more pain today and that this is the worst day so far  Objective:   VITALS:   Filed Vitals:   10/19/12 2300 10/20/12 0000 10/20/12 0400 10/20/12 0514  BP: 115/62   140/62  Pulse: 67   76  Temp: 98.4 F (36.9 C)   97.9 F (36.6 C)  TempSrc: Oral   Oral  Resp: 18 18 18 18   SpO2: 97% 97% 97% 97%    ABD soft Sensation intact distally Dorsiflexion/Plantar flexion intact Incision: dressing C/D/I and no drainage  LABS  Results for orders placed during the hospital encounter of 10/18/12 (from the past 24 hour(s))  PROTIME-INR     Status: Abnormal   Collection Time   10/20/12  5:40 AM      Component Value Range   Prothrombin Time 24.1 (*) 11.6 - 15.2 seconds   INR 2.28 (*) 0.00 - 1.49  CBC     Status: Abnormal   Collection Time   10/20/12  5:40 AM      Component Value Range   WBC 8.9  4.0 - 10.5 K/uL   RBC 2.80 (*) 3.87 - 5.11 MIL/uL   Hemoglobin 9.2 (*) 12.0 - 15.0 g/dL   HCT 11.9 (*) 14.7 - 82.9 %   MCV 95.0  78.0 - 100.0 fL   MCH 32.9  26.0 - 34.0 pg   MCHC 34.6  30.0 - 36.0 g/dL   RDW 56.2  13.0 - 86.5 %   Platelets 224  150 - 400 K/uL  BASIC METABOLIC PANEL     Status: Abnormal   Collection Time   10/20/12  5:40 AM      Component Value Range   Sodium 136  135 - 145 mEq/L   Potassium 3.7  3.5 - 5.1 mEq/L   Chloride 101  96 - 112 mEq/L   CO2 29  19 - 32 mEq/L   Glucose, Bld 115 (*) 70 - 99 mg/dL   BUN 13  6 - 23 mg/dL   Creatinine, Ser 7.84  0.50 - 1.10 mg/dL   Calcium 8.6  8.4 - 69.6 mg/dL   GFR calc non Af Amer 82 (*) >90 mL/min   GFR calc Af Amer >90  >90 mL/min    Dg Pelvis Portable  10/18/2012  *RADIOLOGY REPORT*  Clinical Data: Postop right hip replacement.  PORTABLE PELVIS  Comparison: None.  Findings: Changes of right hip replacement.  No hardware or bony complicating feature.  Normal AP  alignment.  Degenerative changes within the left hip.  IMPRESSION: Right hip replacement.  No complicating feature.   Original Report Authenticated By: Charlett Nose, M.D.    Dg Hip Portable 1 View Right  10/18/2012  *RADIOLOGY REPORT*  Clinical Data: Postop right total hip replacement.  PORTABLE RIGHT HIP - 1 VIEW  Comparison: AP pelvic radiograph - earlier same day  Findings:  This examination was interpreted with AP pelvic radiograph performed earlier same day.  Post right total hip replacement without evidence of hardware failure or loosening.  No definite fracture.  There is a minimal amount of expected subcutaneous emphysema about the operative site.  No radiopaque foreign body.  Mild degenerative change of the contralateral left hip is suspected with minimal axial migration, joint space loss and subchondral sclerosis.  IMPRESSION: Post right total  hip replacement without complicating feature.   Original Report Authenticated By: Tacey Ruiz, MD     Assessment/Plan: 2 Days Post-Op   Principal Problem:  *Osteoarthritis of right hip   Advance diet Up with therapy Plan for discharge tomorrow ABLA continue to monitor.   Haskel Khan 10/20/2012, 7:21 AM   Teryl Lucy, MD Cell [phone] Pager [phone]

## 2012-10-20 NOTE — Progress Notes (Signed)
Physical Therapy Treatment Patient Details Name: Denise Lane MRN: [id number] DOB: [birth year] Today's Date: 10/20/2012 Time: 1478-2956 PT Time Calculation (min): 49 min  PT Assessment / Plan / Recommendation Comments on Treatment Session  Patient with increased soreness this session. Held off on steps this morning. Will attempt this afternoon. Focused on bed mobility this session per patient request    Follow Up Recommendations  Home health PT;Supervision/Assistance - 24 hour     Does the patient have the potential to tolerate intense rehabilitation     Barriers to Discharge        Equipment Recommendations  Rolling walker with 5" wheels    Recommendations for Other Services    Frequency 7X/week   Plan Discharge plan remains appropriate;Frequency remains appropriate    Precautions / Restrictions Precautions Precautions: Posterior Hip Precaution Comments: Patient reeducated on all precautions Restrictions Weight Bearing Restrictions: Yes RLE Weight Bearing: Weight bearing as tolerated   Pertinent Vitals/Pain     Mobility  Bed Mobility Supine to Sit: 4: Min assist;HOB flat Sitting - Scoot to Edge of Bed: 4: Min assist Sit to Supine: 4: Min assist;HOB flat Details for Bed Mobility Assistance: Patient wanted to attempt getting into on R side of bed as she does at home. Patient required VC for positoning and technique and A for R LE in and out of bed Transfers Sit to Stand: 4: Min assist;With upper extremity assist;From chair/3-in-1 Stand to Sit: 4: Min assist;With upper extremity assist;To chair/3-in-1 Details for Transfer Assistance: A to initiate stand and for stability. Cues to keep R leg out with sit to stand and cues to safe hand placement Ambulation/Gait Ambulation/Gait Assistance: 4: Min assist Ambulation Distance (Feet): 40 Feet Assistive device: Rolling walker Ambulation/Gait Assistance Details: Cues for posture. A for stability and RW management.  Gait  Pattern: Step-to pattern;Trunk flexed    Exercises Total Joint Exercises Quad Sets: AROM;Right;10 reps Gluteal Sets: AROM;Right;10 reps Heel Slides: AAROM;Right;10 reps Hip ABduction/ADduction: AAROM;Right;10 reps   PT Diagnosis:    PT Problem List:   PT Treatment Interventions:     PT Goals Acute Rehab PT Goals PT Goal: Supine/Side to Sit - Progress: Progressing toward goal PT Goal: Sit to Supine/Side - Progress: Progressing toward goal PT Goal: Sit to Stand - Progress: Progressing toward goal PT Goal: Stand to Sit - Progress: Progressing toward goal PT Goal: Ambulate - Progress: Progressing toward goal PT Goal: Perform Home Exercise Program - Progress: Progressing toward goal  Visit Information  Last PT Received On: 10/20/12 Assistance Needed: +1    Subjective Data      Cognition  Overall Cognitive Status: Appears within functional limits for tasks assessed/performed Arousal/Alertness: Awake/alert Orientation Level: Appears intact for tasks assessed Behavior During Session: [facility] for tasks performed    Balance     End of Session PT - End of Session Equipment Utilized During Treatment: Gait belt Activity Tolerance: Patient tolerated treatment well Patient left: in chair;with call bell/phone within reach Nurse Communication: Mobility status   GP     Fredrich Birks 10/20/2012, 10:59 AM 10/20/2012 Fredrich Birks PTA [phone] pager [phone] office

## 2012-10-20 NOTE — Progress Notes (Signed)
Physical Therapy Treatment Patient Details Name: Denise Lane MRN: [id number] DOB: [birth year] Today's Date: 10/20/2012 Time: 1410-1440 PT Time Calculation (min): 30 min  PT Assessment / Plan / Recommendation Comments on Treatment Session  Patient progressing and feeling better this afternoon. Did not attempt steps this afternoon however will practice in the morning as patient is planning on discharging home with assitance from her husband    Follow Up Recommendations  Home health PT;Supervision/Assistance - 24 hour     Does the patient have the potential to tolerate intense rehabilitation     Barriers to Discharge        Equipment Recommendations  Rolling walker with 5" wheels    Recommendations for Other Services    Frequency 7X/week   Plan Discharge plan remains appropriate;Frequency remains appropriate    Precautions / Restrictions Precautions Precautions: Posterior Hip Precaution Comments: Pt able to recall 2/4 hip precautions Restrictions Weight Bearing Restrictions: Yes RLE Weight Bearing: Weight bearing as tolerated   Pertinent Vitals/Pain     Mobility  Bed Mobility Bed Mobility: Sit to Supine Supine to Sit: 4: Min guard Sitting - Scoot to Edge of Bed: 4: Min assist Sit to Supine: 4: Min guard Details for Bed Mobility Assistance: Patient wanted to attempt getting into on R side of bed as she does at home. Patient required VC for positoning and technique and A for R LE in and out of bed Transfers Sit to Stand: 4: Min assist;With upper extremity assist;1: +2 Total assist;From bed Stand to Sit: 4: Min guard;With upper extremity assist;To bed Details for Transfer Assistance: A for stand and balance. Cues for safe technique and hand placement Ambulation/Gait Ambulation/Gait Assistance: 4: Min guard Ambulation Distance (Feet): 110 Feet Assistive device: Rolling walker Ambulation/Gait Assistance Details: Cues for posture and positioning inside of RW. Patient able  to increase ambulation distance this session Gait Pattern: Step-to pattern;Decreased step length - right;Decreased step length - left;Trunk flexed    Exercises Total Joint Exercises Quad Sets: AROM;Right;10 reps Gluteal Sets: AROM;Right;10 reps Short Arc QuadBarbaraann Lane;Right;10 reps Heel Slides: AAROM;Right;10 reps Hip ABduction/ADduction: AAROM;Right;10 reps   PT Diagnosis:    PT Problem List:   PT Treatment Interventions:     PT Goals Acute Rehab PT Goals PT Goal: Supine/Side to Sit - Progress: Progressing toward goal PT Goal: Sit to Supine/Side - Progress: Progressing toward goal PT Goal: Sit to Stand - Progress: Progressing toward goal PT Goal: Stand to Sit - Progress: Progressing toward goal PT Goal: Ambulate - Progress: Progressing toward goal PT Goal: Perform Home Exercise Program - Progress: Progressing toward goal  Visit Information  Last PT Received On: 10/20/12 Assistance Needed: +1    Subjective Data      Cognition  Overall Cognitive Status: Appears within functional limits for tasks assessed/performed Arousal/Alertness: Awake/alert Orientation Level: Appears intact for tasks assessed Behavior During Session: [facility] for tasks performed    Balance  Balance Balance Assessed: No  End of Session PT - End of Session Equipment Utilized During Treatment: Gait belt Activity Tolerance: Patient tolerated treatment well Patient left: with call bell/phone within reach;in bed Nurse Communication: Mobility status   GP     Fredrich Birks 10/20/2012, 2:49 PM 10/20/2012 Fredrich Birks PTA [phone] pager [phone] office

## 2012-10-20 NOTE — Evaluation (Signed)
Occupational Therapy Evaluation Patient Details Name: Denise Lane MRN: [id number] DOB: 1930/07/07 Today's Date: 10/20/2012 Time: 1478-2956 OT Time Calculation (min): 41 min  OT Assessment / Plan / Recommendation Clinical Impression  Pt demos decline in function with ADLs, strength, balance, safety and activity tolerance. pt would benefit from skilled OT services to address these impairments to restore PLOF to return home safely    OT Assessment  Patient needs continued OT Services    Follow Up Recommendations  Home health OT    Barriers to Discharge None    Equipment Recommendations  3 in 1 bedside comode;Tub/shower bench    Recommendations for Other Services    Frequency  Min 2X/week    Precautions / Restrictions Precautions Precautions: Posterior Hip Precaution Comments: Pt able to recall 2/4 hip precautions Restrictions Weight Bearing Restrictions: Yes RLE Weight Bearing: Weight bearing as tolerated       ADL  Eating/Feeding: Performed;Independent Where Assessed - Eating/Feeding: Chair Grooming: Performed;Wash/dry hands;Wash/dry face;Min guard Where Assessed - Grooming: Supported standing Upper Body Bathing: Simulated;Supervision/safety;Set up Where Assessed - Upper Body Bathing: Unsupported sitting Lower Body Bathing: Simulated;Moderate assistance Where Assessed - Lower Body Bathing: Unsupported sitting Upper Body Dressing: Performed;Supervision/safety;Set up Where Assessed - Upper Body Dressing: Unsupported sitting Lower Body Dressing: Performed;Moderate assistance Where Assessed - Lower Body Dressing: Unsupported sitting;Supported sit to stand Toilet Transfer: Performed;Minimal assistance Toilet Transfer Method: Other (comment) (ambulating from RW level) Toilet Transfer Equipment: Raised toilet seat with arms (or 3-in-1 over toilet) Toileting - Clothing Manipulation and Hygiene: Performed;Minimal assistance Where Assessed - Glass blower/designer Manipulation  and Hygiene: Standing Equipment Used: Sock aid;Rolling walker;Gait belt;Long-handled shoe horn;Long-handled sponge;Reacher ADL Comments: Pt does not currently have ADL A/E at home, pt educated with demo on ADL A/ for home use    OT Diagnosis: Generalized weakness  OT Problem List: Decreased strength;Decreased activity tolerance;Impaired balance (sitting and/or standing);Decreased knowledge of use of DME or AE;Decreased knowledge of precautions;Pain OT Treatment Interventions: Self-care/ADL training;Therapeutic exercise;Therapeutic activities;Neuromuscular education;DME and/or AE instruction;Patient/family education;Balance training   OT Goals Acute Rehab OT Goals OT Goal Formulation: With patient Time For Goal Achievement: 10/27/12 Potential to Achieve Goals: Good ADL Goals Pt Will Perform Grooming: with supervision;Standing at sink ADL Goal: Grooming - Progress: Goal set today Pt Will Perform Lower Body Bathing: with min assist;with adaptive equipment ADL Goal: Lower Body Bathing - Progress: Goal set today Pt Will Perform Lower Body Dressing: with min assist;with adaptive equipment ADL Goal: Lower Body Dressing - Progress: Goal set today Pt Will Transfer to Toilet: with supervision;with DME;Grab bars ADL Goal: Toilet Transfer - Progress: Goal set today Pt Will Perform Toileting - Clothing Manipulation: with supervision;Standing ADL Goal: Toileting - Clothing Manipulation - Progress: Goal set today Pt Will Perform Toileting - Hygiene: with supervision;Sit to stand from 3-in-1/toilet;Standing at 3-in-1/toilet ADL Goal: Toileting - Hygiene - Progress: Goal set today Pt Will Perform Tub/Shower Transfer: with supervision ADL Goal: Tub/Shower Transfer - Progress: Goal set today  Visit Information  Last OT Received On: 10/20/12 Assistance Needed: +1    Subjective Data  Subjective: " I will be goinmg home with my husband and he can be there all day with me " Patient Stated Goal: to return  home   Prior Functioning     Home Living Lives With: Spouse Available Help at Discharge: Family Type of Home: House Home Access: Stairs to enter [employer] of Steps: 4 Entrance Stairs-Rails: Right;Left Home Layout: One level Bathroom Shower/Tub: Engineer, manufacturing systems: Standard Home Adaptive  Equipment: Other (comment) (quad cane, hand held shower head) Prior Function Level of Independence: Independent with assistive device(s) Able to Take Stairs?: Yes Driving: Yes 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 for tasks performed    Extremity/Trunk Assessment Right Upper Extremity Assessment RUE ROM/Strength/Tone: Within functional levels;WFL for tasks assessed Left Upper Extremity Assessment LUE ROM/Strength/Tone: WFL for tasks assessed     Mobility Bed Mobility Sit to Supine: 4: Min assist;HOB flat Details for Bed Mobility Assistance: Patient wanted to attempt getting into on R side of bed as she does at home. Patient required VC for positoning and technique and A for R LE in and out of bed Transfers Transfers: Sit to Stand;Stand to Sit Sit to Stand: 4: Min assist;With upper extremity assist;From chair/3-in-1;From toilet Stand to Sit: 4: Min assist;To chair/3-in-1;To toilet;To bed Details for Transfer Assistance: A to initiate stand and for stability. Cues to keep R leg out with sit to stand and cues to safe hand placement     Shoulder Instructions     Exercise    Balance Balance Balance Assessed:  No   End of Session OT - End of Session Equipment Utilized During Treatment: Gait belt;Other (comment) (3 in 1, RW, ADL A/E) Activity Tolerance: Patient tolerated treatment well Patient left: in bed;with call bell/phone within reach;Other (comment) (PT entering room to work with pt)  GO     Galen Manila 10/20/2012, 2:25 PM

## 2012-10-20 NOTE — Plan of Care (Signed)
Problem: Discharge Progression Outcomes Goal: Activity appropriate for discharge plan Recommend HH OT for ADLs and ADL mobility safety after acute care d/c

## 2012-10-21 LAB — CBC
HCT: 26.6 % — ABNORMAL LOW (ref 36.0–46.0)
Hemoglobin: 9.2 g/dL — ABNORMAL LOW (ref 12.0–15.0)
MCHC: 34.6 g/dL (ref 30.0–36.0)
MCV: 95 fL (ref 78.0–100.0)
RDW: 12.2 % (ref 11.5–15.5)

## 2012-10-21 LAB — PROTIME-INR: INR: 1.72 — ABNORMAL HIGH (ref 0.00–1.49)

## 2012-10-21 MED ORDER — WARFARIN SODIUM 5 MG PO TABS: 5.0000 mg | ORAL_TABLET | Freq: Every day | ORAL | Status: DC

## 2012-10-21 MED ORDER — OXYCODONE-ACETAMINOPHEN 5-325 MG PO TABS: 1.0000 | ORAL_TABLET | Freq: Four times a day (QID) | ORAL | Status: DC | PRN

## 2012-10-21 MED ORDER — SENNA-DOCUSATE SODIUM 8.6-50 MG PO TABS: 1.0000 | ORAL_TABLET | Freq: Every day | ORAL | Status: DC

## 2012-10-21 NOTE — Progress Notes (Signed)
Physical Therapy Treatment Patient Details Name: Denise Lane MRN: [id number] DOB: [birth year] Today's Date: 10/21/2012 Time: 4098-1191 PT Time Calculation (min): 38 min  PT Assessment / Plan / Recommendation Comments on Treatment Session  Patient tolerated steps with safe technique.  Addressed family concerns of getting patient in home. Pt and family now feel safe to get into home with side stepping using one rail.    Follow Up Recommendations  Home health PT;Supervision/Assistance - 24 hour     Does the patient have the potential to tolerate intense rehabilitation     Barriers to Discharge        Equipment Recommendations  Rolling walker with 5" wheels    Recommendations for Other Services    Frequency     Plan Discharge plan remains appropriate    Precautions / Restrictions Precautions Precautions: Posterior Hip Precaution Comments: Reviewed hip precautions. Pt able to recall. Restrictions Weight Bearing Restrictions: Yes RLE Weight Bearing: Weight bearing as tolerated   Pertinent Vitals/Pain 5/10 pain.    Mobility  Transfers Sit to Stand: With upper extremity assist;From chair/3-in-1;4: Min assist Stand to Sit: 4: Min guard;With upper extremity assist;To chair/3-in-1 Ambulation/Gait Ambulation/Gait Assistance: 4: Min guard Assistive device: Rolling walker Ambulation/Gait Assistance Details: Pt complain of feeling nausea with ambulation. Required chair to return to room. Gait Pattern: Step-to pattern;Trunk flexed;Decreased step length - left;Decreased step length - right Stairs: Yes Stairs Assistance: 4: Min guard Stair Management Technique: One rail Right;Sideways Number of Stairs: 4     Exercises Total Joint Exercises Ankle Circles/Pumps: AROM;10 reps;Right Gluteal Sets: AROM;Right;10 reps Short Arc Quad: AROM;10 reps;Right Heel Slides: AAROM;Right;10 reps Hip ABduction/ADduction: AROM;Right;10 reps   PT Diagnosis:    PT Problem List:   PT Treatment  Interventions:     PT Goals Acute Rehab PT Goals PT Goal: Sit to Stand - Progress: Progressing toward goal PT Goal: Stand to Sit - Progress: Progressing toward goal PT Goal: Ambulate - Progress: Progressing toward goal PT Goal: Up/Down Stairs - Progress: Met PT Goal: Perform Home Exercise Program - Progress: Progressing toward goal  Visit Information  Last PT Received On: 10/21/12    Subjective Data      Cognition  Overall Cognitive Status: Appears within functional limits for tasks assessed/performed Arousal/Alertness: Awake/alert Orientation Level: Appears intact for tasks assessed Behavior During Session: [facility] for tasks performed    Balance     End of Session PT - End of Session Equipment Utilized During Treatment: Gait belt Activity Tolerance: Patient tolerated treatment well Patient left: in chair;with call bell/phone within reach;with family/visitor present   GP     Lazaro Arms 10/21/2012, 12:05 PM

## 2012-10-21 NOTE — Progress Notes (Signed)
ANTICOAGULATION CONSULT NOTE - Follow Up Consult  Pharmacy Consult for coumadin Indication: VTE prophylaxis  Allergies  Allergen Reactions  . Codeine Nausea And Vomiting  . Dilaudid (Hydromorphone Hcl) Hives and Other (See Comments)    "whelps" (10/18/2012)  . Macrodantin Hives and Other (See Comments)    "drug fever; chills; felt terrible" (10/18/2012)  . Morphine And Related Hives and Nausea And Vomiting  . Sulfa Antibiotics Hives, Rash and Other (See Comments)    "got real sick" (10/18/2012)    Patient Measurements: Height: 5\' 6"  (167.6 cm) Weight: 120 lb (54.432 kg) IBW/kg (Calculated) : 59.3  Heparin Dosing Weight:  Vital Signs: Temp: 98.8 F (37.1 C) (01/10 0535) BP: 149/62 mmHg (01/10 0535) Pulse Rate: 93  (01/10 0535)  Labs:  Basename 10/21/12 0600 10/20/12 0540 10/19/12 0530 10/18/12 2039  HGB 9.2* 9.2* -- --  HCT 26.6* 26.6* 28.1* --  PLT 254 224 229 --  APTT -- -- -- --  LABPROT 19.6* 24.1* 15.2 --  INR 1.72* 2.28* 1.22 --  HEPARINUNFRC -- -- -- --  CREATININE -- 0.63 0.64 0.66  CKTOTAL -- -- -- --  CKMB -- -- -- --  TROPONINI -- -- -- --    Estimated Creatinine Clearance: 48.2 ml/min (by C-G formula based on Cr of 0.63).   Medications:  Scheduled:    . calcium citrate  1 tablet Oral Daily  . cholecalciferol  1,000 Units Oral BID  . docusate sodium  100 mg Oral BID  . gabapentin  100 mg Oral QID  . metoprolol tartrate  25 mg Oral Daily  . omega-3 acid ethyl esters  1 g Oral TID  . oxybutynin  5 mg Oral BID  . pantoprazole  40 mg Oral Daily  . senna  1 tablet Oral BID  . Warfarin - Pharmacist Dosing Inpatient   Does not apply q1800  . [DISCONTINUED] enoxaparin (LOVENOX) injection  30 mg Subcutaneous Q24H   Infusions:    . 0.45 % NaCl with KCl 20 mEq / L 75 mL/hr (10/19/12 1802)  . lactated ringers      Assessment: 79 yr female s/p right THA is currently on subtherapeutic coumadin after one dose being held last night.  INR today was 1.72. Plan  to be discharged today.  Goal of Therapy:  INR 2-3    Plan:  1. Rec to have coumadin 2.5mg  po x1 tonight and recheck INR by home health tomorrow to reassess dose    Miah Boye, Tsz-Yin 10/21/2012,8:34 AM

## 2012-10-21 NOTE — Progress Notes (Signed)
Seen and agreed 10/21/2012 Morgen Ritacco Elizabeth PTA [phone] pager [phone] office    

## 2012-10-21 NOTE — Discharge Summary (Signed)
Physician Discharge Summary  Patient ID: Denise Lane MRN: [id number] DOB/AGE: 1930/10/10 79 y.o.  Admit date: 10/18/2012 Discharge date: 10/21/2012  Admission Diagnoses:  Osteoarthritis of right hip  Discharge Diagnoses:  Principal Problem:  *Osteoarthritis of right hip   Past Medical History  Diagnosis Date  . Hemochromatosis 1987  . Stenosis of esophagus   . Dysphagia   . Muscle pain   . History of colonic polyps     NOTED 11-20-09 IN COLONOSCOPY REPORT  . Diverticulosis of sigmoid colon 11-20-2009  . Hemorrhoids, internal 11-20-09  . Hyperlipidemia     no meds required  . Tremor, essential 10-29-09  . Hemochromatosis   . PONV (postoperative nausea and vomiting)   . Hypertension     takes Metoprolol daily  . History of bronchitis 1991-1996    "chronic; related to winter" (10/18/2012)  . History of blood transfusion     "S/P colonoscopy after polyp removed; got 2 units; esophageal bleed got 1 unit; really messed up hemochromatosis" (1/7/20214)  . Occasional tremors   . Joint pain   . Joint swelling   . Bruises easily   . GERD (gastroesophageal reflux disease)     takes OMeprazole daily  . H/O hiatal hernia   . Gastric ulcer   . Vomiting     occasionally  . Urinary urgency     takes Ditropan daily  . Osteoporosis   . Dry eyes     uses Refresh eye drops  . Rheumatic fever     hx of  . Osteoarthritis   . Osteoarthritis of right hip 10/18/2012  . Chronic lower back pain   . Depression     but doesn't require meds  . Kidney stones 1963  . Basal cell cancer     "face, legs" (10/18/2012)  . Squamous carcinoma     "LLE" (10/18/2012)    Surgeries: Procedure(s): TOTAL HIP ARTHROPLASTY on 10/18/2012   Consultants (if any):    Discharged Condition: Improved  Hospital Course: Denise Lane is an 79 y.o. female who was admitted 10/18/2012 with a diagnosis of Osteoarthritis of right hip and went to the operating room on 10/18/2012 and underwent the above named  procedures.    She was given perioperative antibiotics:      Anti-infectives     Start     Dose/Rate Route Frequency Ordered Stop   10/19/12 0600   ceFAZolin (ANCEF) IVPB 2 g/50 mL premix  Status:  Discontinued        2 g 100 mL/hr over 30 Minutes Intravenous On call to O.R. 10/18/12 2011 10/18/12 2025   10/18/12 2200   ceFAZolin (ANCEF) IVPB 1 g/50 mL premix        1 g 100 mL/hr over 30 Minutes Intravenous Every 6 hours 10/18/12 2011 10/19/12 0515        .  She was given sequential compression devices, early ambulation, and lovenox bridging to coumadin for DVT prophylaxis.  She benefited maximally from the hospital stay and there were no complications.    Recent vital signs:  Filed Vitals:   10/21/12 0535  BP: 149/62  Pulse: 93  Temp: 98.8 F (37.1 C)  Resp: 16    Recent laboratory studies:  Lab Results  Component Value Date   HGB 9.2* 10/21/2012   HGB 9.2* 10/20/2012   HGB 9.9* 10/19/2012   Lab Results  Component Value Date   WBC 8.3 10/21/2012   PLT 254 10/21/2012   Lab Results  Component Value  Date   INR 1.72* 10/21/2012   Lab Results  Component Value Date   NA 136 10/20/2012   K 3.7 10/20/2012   CL 101 10/20/2012   CO2 29 10/20/2012   BUN 13 10/20/2012   CREATININE 0.63 10/20/2012   GLUCOSE 115* 10/20/2012    Discharge Medications:     Medication List     As of 10/21/2012  7:13 AM    STOP taking these medications         acetaminophen 650 MG CR tablet   Commonly known as: TYLENOL      MOBIC 15 MG tablet   Generic drug: meloxicam      TAKE these medications         calcium citrate 950 MG tablet   Commonly known as: CALCITRATE - dosed in mg elemental calcium   Take 1 tablet by mouth daily.      carboxymethylcellulose 0.5 % Soln   Commonly known as: REFRESH PLUS   Place 1 drop into both eyes 3 (three) times daily as needed. For dry eyes      cholecalciferol 1000 UNITS tablet   Commonly known as: VITAMIN D   Take 1,000 Units by mouth 2 (two) times  daily.      gabapentin 100 MG capsule   Commonly known as: NEURONTIN   Take 100 mg by mouth 4 (four) times daily.      metoprolol tartrate 25 MG tablet   Commonly known as: LOPRESSOR   Take 25 mg by mouth daily.      omega-3 acid ethyl esters 1 G capsule   Commonly known as: LOVAZA   Take 1 g by mouth 3 (three) times daily.      omeprazole 20 MG capsule   Commonly known as: PRILOSEC   Take 20 mg by mouth daily.      oxybutynin 5 MG tablet   Commonly known as: DITROPAN   Take 5 mg by mouth 2 (two) times daily.      oxyCODONE-acetaminophen 5-325 MG per tablet   Commonly known as: PERCOCET/ROXICET   Take 1-2 tablets by mouth every 6 (six) hours as needed for pain.      raloxifene 60 MG tablet   Commonly known as: EVISTA   Take 60 mg by mouth daily.      sennosides-docusate sodium 8.6-50 MG tablet   Commonly known as: SENOKOT-S   Take 1 tablet by mouth daily.      sennosides-docusate sodium 8.6-50 MG tablet   Commonly known as: SENOKOT-S   Take 1 tablet by mouth daily.      traMADol 50 MG tablet   Commonly known as: ULTRAM   Take 50 mg by mouth every 6 (six) hours as needed. For pain      warfarin 5 MG tablet   Commonly known as: COUMADIN   Take 1 tablet (5 mg total) by mouth daily.      warfarin 5 MG tablet   Commonly known as: COUMADIN   Take 1 tablet (5 mg total) by mouth daily.         Diagnostic Studies: Dg Chest 2 View  10/14/2012  *RADIOLOGY REPORT*  Clinical Data: Preoperative respiratory exam for hip replacement.  CHEST - 2 VIEW  Comparison: None.  Findings: Heart size is normal.  Mediastinal shadows are normal. The lungs are clear.  The vascularity is normal.  No effusions.  No significant bony finding.  IMPRESSION: No active disease   Original Report Authenticated By: Loraine Leriche  Denise Lane, M.D.    Dg Pelvis Portable  10/18/2012  *RADIOLOGY REPORT*  Clinical Data: Postop right hip replacement.  PORTABLE PELVIS  Comparison: None.  Findings: Changes of right hip  replacement.  No hardware or bony complicating feature.  Normal AP alignment.  Degenerative changes within the left hip.  IMPRESSION: Right hip replacement.  No complicating feature.   Original Report Authenticated By: Charlett Nose, M.D.    Dg Hip Portable 1 View Right  10/18/2012  *RADIOLOGY REPORT*  Clinical Data: Postop right total hip replacement.  PORTABLE RIGHT HIP - 1 VIEW  Comparison: AP pelvic radiograph - earlier same day  Findings:  This examination was interpreted with AP pelvic radiograph performed earlier same day.  Post right total hip replacement without evidence of hardware failure or loosening.  No definite fracture.  There is a minimal amount of expected subcutaneous emphysema about the operative site.  No radiopaque foreign body.  Mild degenerative change of the contralateral left hip is suspected with minimal axial migration, joint space loss and subchondral sclerosis.  IMPRESSION: Post right total hip replacement without complicating feature.   Original Report Authenticated By: Tacey Ruiz, MD     Disposition:   Discharge Orders    Future Appointments: Provider: Department: Dept Phone: Center:   12/14/2012 1:00 PM Windell [facility] MEDICAL ONCOLOGY [phone] None   12/20/2012 12:00 PM Reece Packer, MD [facility] MEDICAL ONCOLOGY [phone] None     Future Orders Please Complete By Expires   Diet general      Diet general      Weight bearing as tolerated      Call MD / Call 911      Comments:   If you experience chest pain or shortness of breath, CALL 911 and be transported to the hospital emergency room.  If you develope a fever above 101 F, pus (white drainage) or increased drainage or redness at the wound, or calf pain, call your surgeon's office.   Discharge instructions      Comments:   Change dressing in 3 days and reapply fresh dressing, unless you have a splint (half cast).  If you have a splint/cast, just leave in place  until your follow-up appointment.    Keep wounds dry for 3 weeks.  Leave steri-strips in place on skin.  Do not apply lotion or anything to the wound.   Constipation Prevention      Comments:   Drink plenty of fluids.  Prune juice may be helpful.  You may use a stool softener, such as Colace (over the counter) 100 mg twice a day.  Use MiraLax (over the counter) for constipation as needed.   Follow the hip precautions as taught in Physical Therapy      Change dressing      Comments:   You may change your dressing in 3 days, then change the dressing daily with sterile 4 x 4 inch gauze dressing and paper tape.  You may clean the incision with alcohol prior to redressing   TED hose      Comments:   Use stockings (TED hose) for 2 weeks on both leg(s).  You may remove them at night for sleeping.   Weight bearing as tolerated      Call MD / Call 911      Comments:   If you experience chest pain or shortness of breath, CALL 911 and be transported to the hospital emergency room.  If you develope a fever above 101 F, pus (white drainage) or increased drainage or redness at the wound, or calf pain, call your surgeon's office.   Discharge instructions      Comments:   Change dressing in 3 days and reapply fresh dressing, unless you have a splint (half cast).  If you have a splint/cast, just leave in place until your follow-up appointment.    Keep wounds dry for 3 weeks.  Leave steri-strips in place on skin.  Do not apply lotion or anything to the wound.   Constipation Prevention      Comments:   Drink plenty of fluids.  Prune juice may be helpful.  You may use a stool softener, such as Colace (over the counter) 100 mg twice a day.  Use MiraLax (over the counter) for constipation as needed.   Posterior total hip precautions      Posterior total hip precautions         Follow-up Information    Follow up with Eulas Post, MD. Schedule an appointment as soon as possible for a visit in 2 weeks.    Contact information:   [address] [place] [address] [phone]           Signed: Eulas Post 10/21/2012, 7:13 AM

## 2012-10-22 DIAGNOSIS — Z471 Aftercare following joint replacement surgery: Secondary | ICD-10-CM | POA: Diagnosis not present

## 2012-10-22 DIAGNOSIS — I10 Essential (primary) hypertension: Secondary | ICD-10-CM | POA: Diagnosis not present

## 2012-10-22 DIAGNOSIS — Z96649 Presence of unspecified artificial hip joint: Secondary | ICD-10-CM | POA: Diagnosis not present

## 2012-10-22 DIAGNOSIS — K219 Gastro-esophageal reflux disease without esophagitis: Secondary | ICD-10-CM | POA: Diagnosis not present

## 2012-10-22 DIAGNOSIS — Z7901 Long term (current) use of anticoagulants: Secondary | ICD-10-CM | POA: Diagnosis not present

## 2012-10-22 DIAGNOSIS — Z5181 Encounter for therapeutic drug level monitoring: Secondary | ICD-10-CM | POA: Diagnosis not present

## 2012-10-24 DIAGNOSIS — Z7901 Long term (current) use of anticoagulants: Secondary | ICD-10-CM | POA: Diagnosis not present

## 2012-10-24 DIAGNOSIS — K219 Gastro-esophageal reflux disease without esophagitis: Secondary | ICD-10-CM | POA: Diagnosis not present

## 2012-10-24 DIAGNOSIS — Z471 Aftercare following joint replacement surgery: Secondary | ICD-10-CM | POA: Diagnosis not present

## 2012-10-24 DIAGNOSIS — Z5181 Encounter for therapeutic drug level monitoring: Secondary | ICD-10-CM | POA: Diagnosis not present

## 2012-10-24 DIAGNOSIS — Z96649 Presence of unspecified artificial hip joint: Secondary | ICD-10-CM | POA: Diagnosis not present

## 2012-10-24 DIAGNOSIS — I10 Essential (primary) hypertension: Secondary | ICD-10-CM | POA: Diagnosis not present

## 2012-10-25 DIAGNOSIS — K219 Gastro-esophageal reflux disease without esophagitis: Secondary | ICD-10-CM | POA: Diagnosis not present

## 2012-10-25 DIAGNOSIS — I10 Essential (primary) hypertension: Secondary | ICD-10-CM | POA: Diagnosis not present

## 2012-10-25 DIAGNOSIS — Z5181 Encounter for therapeutic drug level monitoring: Secondary | ICD-10-CM | POA: Diagnosis not present

## 2012-10-25 DIAGNOSIS — Z7901 Long term (current) use of anticoagulants: Secondary | ICD-10-CM | POA: Diagnosis not present

## 2012-10-25 DIAGNOSIS — Z96649 Presence of unspecified artificial hip joint: Secondary | ICD-10-CM | POA: Diagnosis not present

## 2012-10-25 DIAGNOSIS — Z471 Aftercare following joint replacement surgery: Secondary | ICD-10-CM | POA: Diagnosis not present

## 2012-10-26 DIAGNOSIS — K219 Gastro-esophageal reflux disease without esophagitis: Secondary | ICD-10-CM | POA: Diagnosis not present

## 2012-10-26 DIAGNOSIS — Z471 Aftercare following joint replacement surgery: Secondary | ICD-10-CM | POA: Diagnosis not present

## 2012-10-26 DIAGNOSIS — Z7901 Long term (current) use of anticoagulants: Secondary | ICD-10-CM | POA: Diagnosis not present

## 2012-10-26 DIAGNOSIS — Z96649 Presence of unspecified artificial hip joint: Secondary | ICD-10-CM | POA: Diagnosis not present

## 2012-10-26 DIAGNOSIS — I10 Essential (primary) hypertension: Secondary | ICD-10-CM | POA: Diagnosis not present

## 2012-10-26 DIAGNOSIS — Z5181 Encounter for therapeutic drug level monitoring: Secondary | ICD-10-CM | POA: Diagnosis not present

## 2012-10-27 DIAGNOSIS — Z96649 Presence of unspecified artificial hip joint: Secondary | ICD-10-CM | POA: Diagnosis not present

## 2012-10-27 DIAGNOSIS — Z7901 Long term (current) use of anticoagulants: Secondary | ICD-10-CM | POA: Diagnosis not present

## 2012-10-27 DIAGNOSIS — I10 Essential (primary) hypertension: Secondary | ICD-10-CM | POA: Diagnosis not present

## 2012-10-27 DIAGNOSIS — K219 Gastro-esophageal reflux disease without esophagitis: Secondary | ICD-10-CM | POA: Diagnosis not present

## 2012-10-27 DIAGNOSIS — Z471 Aftercare following joint replacement surgery: Secondary | ICD-10-CM | POA: Diagnosis not present

## 2012-10-27 DIAGNOSIS — Z5181 Encounter for therapeutic drug level monitoring: Secondary | ICD-10-CM | POA: Diagnosis not present

## 2012-10-28 DIAGNOSIS — Z96649 Presence of unspecified artificial hip joint: Secondary | ICD-10-CM | POA: Diagnosis not present

## 2012-10-28 DIAGNOSIS — I10 Essential (primary) hypertension: Secondary | ICD-10-CM | POA: Diagnosis not present

## 2012-10-28 DIAGNOSIS — K219 Gastro-esophageal reflux disease without esophagitis: Secondary | ICD-10-CM | POA: Diagnosis not present

## 2012-10-28 DIAGNOSIS — Z5181 Encounter for therapeutic drug level monitoring: Secondary | ICD-10-CM | POA: Diagnosis not present

## 2012-10-28 DIAGNOSIS — Z7901 Long term (current) use of anticoagulants: Secondary | ICD-10-CM | POA: Diagnosis not present

## 2012-10-28 DIAGNOSIS — Z471 Aftercare following joint replacement surgery: Secondary | ICD-10-CM | POA: Diagnosis not present

## 2012-10-31 DIAGNOSIS — Z5181 Encounter for therapeutic drug level monitoring: Secondary | ICD-10-CM | POA: Diagnosis not present

## 2012-10-31 DIAGNOSIS — Z96649 Presence of unspecified artificial hip joint: Secondary | ICD-10-CM | POA: Diagnosis not present

## 2012-10-31 DIAGNOSIS — K219 Gastro-esophageal reflux disease without esophagitis: Secondary | ICD-10-CM | POA: Diagnosis not present

## 2012-10-31 DIAGNOSIS — Z471 Aftercare following joint replacement surgery: Secondary | ICD-10-CM | POA: Diagnosis not present

## 2012-10-31 DIAGNOSIS — I10 Essential (primary) hypertension: Secondary | ICD-10-CM | POA: Diagnosis not present

## 2012-10-31 DIAGNOSIS — Z7901 Long term (current) use of anticoagulants: Secondary | ICD-10-CM | POA: Diagnosis not present

## 2012-11-01 DIAGNOSIS — Z7901 Long term (current) use of anticoagulants: Secondary | ICD-10-CM | POA: Diagnosis not present

## 2012-11-01 DIAGNOSIS — Z96649 Presence of unspecified artificial hip joint: Secondary | ICD-10-CM | POA: Diagnosis not present

## 2012-11-01 DIAGNOSIS — K219 Gastro-esophageal reflux disease without esophagitis: Secondary | ICD-10-CM | POA: Diagnosis not present

## 2012-11-01 DIAGNOSIS — Z471 Aftercare following joint replacement surgery: Secondary | ICD-10-CM | POA: Diagnosis not present

## 2012-11-01 DIAGNOSIS — I10 Essential (primary) hypertension: Secondary | ICD-10-CM | POA: Diagnosis not present

## 2012-11-01 DIAGNOSIS — Z5181 Encounter for therapeutic drug level monitoring: Secondary | ICD-10-CM | POA: Diagnosis not present

## 2012-11-02 DIAGNOSIS — Z4789 Encounter for other orthopedic aftercare: Secondary | ICD-10-CM | POA: Diagnosis not present

## 2012-11-02 DIAGNOSIS — I10 Essential (primary) hypertension: Secondary | ICD-10-CM | POA: Diagnosis not present

## 2012-11-02 DIAGNOSIS — K219 Gastro-esophageal reflux disease without esophagitis: Secondary | ICD-10-CM | POA: Diagnosis not present

## 2012-11-02 DIAGNOSIS — Z471 Aftercare following joint replacement surgery: Secondary | ICD-10-CM | POA: Diagnosis not present

## 2012-11-02 DIAGNOSIS — Z5181 Encounter for therapeutic drug level monitoring: Secondary | ICD-10-CM | POA: Diagnosis not present

## 2012-11-02 DIAGNOSIS — Z7901 Long term (current) use of anticoagulants: Secondary | ICD-10-CM | POA: Diagnosis not present

## 2012-11-02 DIAGNOSIS — Z96649 Presence of unspecified artificial hip joint: Secondary | ICD-10-CM | POA: Diagnosis not present

## 2012-11-03 DIAGNOSIS — K219 Gastro-esophageal reflux disease without esophagitis: Secondary | ICD-10-CM | POA: Diagnosis not present

## 2012-11-03 DIAGNOSIS — Z5181 Encounter for therapeutic drug level monitoring: Secondary | ICD-10-CM | POA: Diagnosis not present

## 2012-11-03 DIAGNOSIS — Z7901 Long term (current) use of anticoagulants: Secondary | ICD-10-CM | POA: Diagnosis not present

## 2012-11-03 DIAGNOSIS — Z471 Aftercare following joint replacement surgery: Secondary | ICD-10-CM | POA: Diagnosis not present

## 2012-11-03 DIAGNOSIS — I10 Essential (primary) hypertension: Secondary | ICD-10-CM | POA: Diagnosis not present

## 2012-11-03 DIAGNOSIS — Z96649 Presence of unspecified artificial hip joint: Secondary | ICD-10-CM | POA: Diagnosis not present

## 2012-11-04 DIAGNOSIS — Z5181 Encounter for therapeutic drug level monitoring: Secondary | ICD-10-CM | POA: Diagnosis not present

## 2012-11-04 DIAGNOSIS — I10 Essential (primary) hypertension: Secondary | ICD-10-CM | POA: Diagnosis not present

## 2012-11-04 DIAGNOSIS — Z471 Aftercare following joint replacement surgery: Secondary | ICD-10-CM | POA: Diagnosis not present

## 2012-11-04 DIAGNOSIS — Z96649 Presence of unspecified artificial hip joint: Secondary | ICD-10-CM | POA: Diagnosis not present

## 2012-11-04 DIAGNOSIS — Z7901 Long term (current) use of anticoagulants: Secondary | ICD-10-CM | POA: Diagnosis not present

## 2012-11-04 DIAGNOSIS — K219 Gastro-esophageal reflux disease without esophagitis: Secondary | ICD-10-CM | POA: Diagnosis not present

## 2012-11-07 DIAGNOSIS — Z7901 Long term (current) use of anticoagulants: Secondary | ICD-10-CM | POA: Diagnosis not present

## 2012-11-07 DIAGNOSIS — Z5181 Encounter for therapeutic drug level monitoring: Secondary | ICD-10-CM | POA: Diagnosis not present

## 2012-11-07 DIAGNOSIS — Z471 Aftercare following joint replacement surgery: Secondary | ICD-10-CM | POA: Diagnosis not present

## 2012-11-07 DIAGNOSIS — Z96649 Presence of unspecified artificial hip joint: Secondary | ICD-10-CM | POA: Diagnosis not present

## 2012-11-07 DIAGNOSIS — K219 Gastro-esophageal reflux disease without esophagitis: Secondary | ICD-10-CM | POA: Diagnosis not present

## 2012-11-07 DIAGNOSIS — I10 Essential (primary) hypertension: Secondary | ICD-10-CM | POA: Diagnosis not present

## 2012-11-08 DIAGNOSIS — Z96649 Presence of unspecified artificial hip joint: Secondary | ICD-10-CM | POA: Diagnosis not present

## 2012-11-08 DIAGNOSIS — G609 Hereditary and idiopathic neuropathy, unspecified: Secondary | ICD-10-CM | POA: Diagnosis not present

## 2012-11-08 DIAGNOSIS — I10 Essential (primary) hypertension: Secondary | ICD-10-CM | POA: Diagnosis not present

## 2012-11-08 DIAGNOSIS — M25559 Pain in unspecified hip: Secondary | ICD-10-CM | POA: Diagnosis not present

## 2012-11-09 ENCOUNTER — Encounter: Payer: Self-pay | Admitting: Geriatric Medicine

## 2012-11-09 DIAGNOSIS — Z96649 Presence of unspecified artificial hip joint: Secondary | ICD-10-CM | POA: Diagnosis not present

## 2012-11-09 DIAGNOSIS — Z5181 Encounter for therapeutic drug level monitoring: Secondary | ICD-10-CM | POA: Diagnosis not present

## 2012-11-09 DIAGNOSIS — I10 Essential (primary) hypertension: Secondary | ICD-10-CM | POA: Diagnosis not present

## 2012-11-09 DIAGNOSIS — K219 Gastro-esophageal reflux disease without esophagitis: Secondary | ICD-10-CM | POA: Diagnosis not present

## 2012-11-09 DIAGNOSIS — Z7901 Long term (current) use of anticoagulants: Secondary | ICD-10-CM | POA: Diagnosis not present

## 2012-11-09 DIAGNOSIS — Z471 Aftercare following joint replacement surgery: Secondary | ICD-10-CM | POA: Diagnosis not present

## 2012-11-10 DIAGNOSIS — Z96649 Presence of unspecified artificial hip joint: Secondary | ICD-10-CM | POA: Diagnosis not present

## 2012-11-10 DIAGNOSIS — Z7901 Long term (current) use of anticoagulants: Secondary | ICD-10-CM | POA: Diagnosis not present

## 2012-11-10 DIAGNOSIS — Z5181 Encounter for therapeutic drug level monitoring: Secondary | ICD-10-CM | POA: Diagnosis not present

## 2012-11-10 DIAGNOSIS — K219 Gastro-esophageal reflux disease without esophagitis: Secondary | ICD-10-CM | POA: Diagnosis not present

## 2012-11-10 DIAGNOSIS — I10 Essential (primary) hypertension: Secondary | ICD-10-CM | POA: Diagnosis not present

## 2012-11-10 DIAGNOSIS — Z471 Aftercare following joint replacement surgery: Secondary | ICD-10-CM | POA: Diagnosis not present

## 2012-11-11 DIAGNOSIS — Z471 Aftercare following joint replacement surgery: Secondary | ICD-10-CM | POA: Diagnosis not present

## 2012-11-11 DIAGNOSIS — Z7901 Long term (current) use of anticoagulants: Secondary | ICD-10-CM | POA: Diagnosis not present

## 2012-11-11 DIAGNOSIS — Z5181 Encounter for therapeutic drug level monitoring: Secondary | ICD-10-CM | POA: Diagnosis not present

## 2012-11-11 DIAGNOSIS — Z96649 Presence of unspecified artificial hip joint: Secondary | ICD-10-CM | POA: Diagnosis not present

## 2012-11-11 DIAGNOSIS — K219 Gastro-esophageal reflux disease without esophagitis: Secondary | ICD-10-CM | POA: Diagnosis not present

## 2012-11-11 DIAGNOSIS — I10 Essential (primary) hypertension: Secondary | ICD-10-CM | POA: Diagnosis not present

## 2012-11-14 DIAGNOSIS — Z96649 Presence of unspecified artificial hip joint: Secondary | ICD-10-CM | POA: Diagnosis not present

## 2012-11-14 DIAGNOSIS — Z5181 Encounter for therapeutic drug level monitoring: Secondary | ICD-10-CM | POA: Diagnosis not present

## 2012-11-14 DIAGNOSIS — Z471 Aftercare following joint replacement surgery: Secondary | ICD-10-CM | POA: Diagnosis not present

## 2012-11-14 DIAGNOSIS — Z7901 Long term (current) use of anticoagulants: Secondary | ICD-10-CM | POA: Diagnosis not present

## 2012-11-14 DIAGNOSIS — I10 Essential (primary) hypertension: Secondary | ICD-10-CM | POA: Diagnosis not present

## 2012-11-14 DIAGNOSIS — K219 Gastro-esophageal reflux disease without esophagitis: Secondary | ICD-10-CM | POA: Diagnosis not present

## 2012-11-30 DIAGNOSIS — M545 Low back pain: Secondary | ICD-10-CM | POA: Diagnosis not present

## 2012-12-13 ENCOUNTER — Other Ambulatory Visit: Payer: Self-pay | Admitting: Oncology

## 2012-12-14 ENCOUNTER — Other Ambulatory Visit (HOSPITAL_BASED_OUTPATIENT_CLINIC_OR_DEPARTMENT_OTHER): Payer: Medicare Other | Admitting: Lab

## 2012-12-14 LAB — CBC WITH DIFFERENTIAL/PLATELET
BASO%: 0.6 % (ref 0.0–2.0)
EOS%: 2.6 % (ref 0.0–7.0)
MCHC: 34.1 g/dL (ref 31.5–36.0)
MONO#: 0.7 10*3/uL (ref 0.1–0.9)
RBC: 3.98 10*6/uL (ref 3.70–5.45)
WBC: 7.8 10*3/uL (ref 3.9–10.3)
lymph#: 2.5 10*3/uL (ref 0.9–3.3)

## 2012-12-14 LAB — FERRITIN: Ferritin: 33 ng/mL (ref 10–291)

## 2012-12-16 ENCOUNTER — Telehealth: Payer: Self-pay

## 2012-12-16 NOTE — Telephone Encounter (Signed)
Labs seen and need follow up: please let her know ferritin still in good range at 33. If she is doing ok, could move appointment presently scheduled for midlevel on 3-12 to Dr LL in 3-4 months, with CBC and ferritin prior. Cc LA, TH Ms. Kaestner concerned about loosing wt. since 2010 and her hgb. runs higher than 13.9 usually.  She is concerned and wants to keep appt. with Tiana Loft PA-C on 12-21-12 as scheduled.  Dr. Demetrios Isaacs told her that she could become anemic at some point in time.

## 2012-12-20 ENCOUNTER — Ambulatory Visit: Payer: Medicare Other | Admitting: Oncology

## 2012-12-20 ENCOUNTER — Other Ambulatory Visit: Payer: Medicare Other | Admitting: Lab

## 2012-12-21 ENCOUNTER — Ambulatory Visit: Payer: BLUE CROSS/BLUE SHIELD | Admitting: Physician Assistant

## 2012-12-23 ENCOUNTER — Telehealth: Payer: Self-pay | Admitting: Specialist

## 2012-12-23 ENCOUNTER — Ambulatory Visit (HOSPITAL_BASED_OUTPATIENT_CLINIC_OR_DEPARTMENT_OTHER): Payer: Medicare Other | Admitting: Physician Assistant

## 2012-12-23 NOTE — Telephone Encounter (Signed)
gv and printed appt schedule for Sept...michelle add tx

## 2012-12-23 NOTE — Patient Instructions (Addendum)
Followup with Dr. Darrold Span. in 6 months with labs 1 week prior to your followup appointment

## 2012-12-28 NOTE — Progress Notes (Signed)
OFFICE PROGRESS NOTE Date of Visit 12-23-2012 Physicians: A.Green, J.Edwards  INTERVAL HISTORY:   Patient is seen, alone for visit today, in yearly follow up of her hemachromatosis, which we treat with prn phlebotomy to keep ferritin <=50. Most recent ferritin was 33 on 12-14-12. She's been well since her last visit here. She does report however that she is status post right hip replacement on 10/18/2012 by Dr. Dion Saucier of the [facility] orthopedic group. She has a followup appointment with Dr. Dion Saucier on 12/28/2012 and is to followup with her primary care physician Dr. Neva Seat on 01/04/2013. Her appetite has improved a bit after her hip surgery and she is been supplementing her diet with a B12 supplement as well as an Atkins protein shake daily. She reports she had her mammogram performed on 12/08/2012 at the [facility] and was told that this study was okay.   Review of Systems otherwise: no bleeding or symptoms of blood clots. 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 No other recent infectious illness. No abdominal pain, good energy, no GI symptoms. Remainder of 10 point ROS negative. Objective:  Vital signs in last 24 hours:  BP 169/58  Pulse 61  Temp(Src) 97.8 F (36.6 C) (Oral)  Resp 20  Ht 5\' 6"  (1.676 m)  Wt 123 lb (55.792 kg)  BMI 19.86 kg/m2 Alert, NAD, able to move about the exam room without assistance.    HEENT:mucous membranes moist, pharynx normal without lesions.  LymphaticsCervical, supraclavicular, and axillary nodes normal. Resp: clear to auscultation bilaterally and normal percussion bilaterally Cardio: slightly irregular rate and rhythmn GI: soft, non-tender; bowel sounds normal; no masses,  no organomegaly Extremities: extremities normal, atraumatic, no cyanosis or edema. Stable arthritic changes to fingers bilaterally    Lab Results: from [facility] 08-03-11 WBC 7.8, Hgb12.9, MCV 95, plt 244 ANC 4.4, differential not  remarkable     Studies/Results:  No results found.  Medications: I have reviewed the patient's current medications. Patient discussed with Dr. Darrold Span . Assessment/Plan:  1.hemachromatosis: phlebotomize to keep ferritin < 50. Hematocrit is 37.8% therefore not requiring phlebotomy. She will followup with Dr. Darrold Span in 6 months with a CBC and ferritin 1 week prior to the followup visit. Patient is in agreement with plan.  2.status post right hip replacement with orthopedic followup as scheduled        Conni Slipper, PA-C   12/28/2012

## 2013-01-04 ENCOUNTER — Ambulatory Visit: Payer: Self-pay | Admitting: Nurse Practitioner

## 2013-01-04 ENCOUNTER — Other Ambulatory Visit: Payer: Self-pay | Admitting: Internal Medicine

## 2013-01-12 ENCOUNTER — Ambulatory Visit (INDEPENDENT_AMBULATORY_CARE_PROVIDER_SITE_OTHER): Payer: Medicare Other | Admitting: Nurse Practitioner

## 2013-01-12 ENCOUNTER — Encounter: Payer: Self-pay | Admitting: Nurse Practitioner

## 2013-01-12 DIAGNOSIS — M549 Dorsalgia, unspecified: Secondary | ICD-10-CM | POA: Insufficient documentation

## 2013-01-12 DIAGNOSIS — M169 Osteoarthritis of hip, unspecified: Secondary | ICD-10-CM

## 2013-01-12 DIAGNOSIS — M1611 Unilateral primary osteoarthritis, right hip: Secondary | ICD-10-CM

## 2013-01-12 DIAGNOSIS — E785 Hyperlipidemia, unspecified: Secondary | ICD-10-CM | POA: Insufficient documentation

## 2013-01-12 DIAGNOSIS — R32 Unspecified urinary incontinence: Secondary | ICD-10-CM | POA: Diagnosis not present

## 2013-01-12 DIAGNOSIS — I10 Essential (primary) hypertension: Secondary | ICD-10-CM | POA: Insufficient documentation

## 2013-01-12 DIAGNOSIS — M791 Myalgia, unspecified site: Secondary | ICD-10-CM | POA: Insufficient documentation

## 2013-01-12 MED ORDER — METOPROLOL TARTRATE 25 MG PO TABS: 25.0000 mg | ORAL_TABLET | Freq: Every day | ORAL | Status: DC

## 2013-01-12 MED ORDER — OXYBUTYNIN CHLORIDE 5 MG PO TABS: 5.0000 mg | ORAL_TABLET | Freq: Two times a day (BID) | ORAL | Status: DC

## 2013-01-12 NOTE — Assessment & Plan Note (Signed)
Improved after right total hip

## 2013-01-12 NOTE — Progress Notes (Signed)
Patient ID: Denise Lane, female   DOB: 1930/09/08, 79 y.o.   MRN: [id number]   Allergies  Allergen Reactions  . Codeine Nausea And Vomiting  . Dilaudid (Hydromorphone Hcl) Hives and Other (See Comments)    "whelps" (10/18/2012)  . Macrodantin Hives and Other (See Comments)    "drug fever; chills; felt terrible" (10/18/2012)  . Morphine And Related Hives and Nausea And Vomiting  . Sulfa Antibiotics Hives, Rash and Other (See Comments)    "got real sick" (10/18/2012)    Chief Complaint  Patient presents with  . Annual Exam  . Hip Pain    left    HPI: Patient is a 79 y.o. female seen in the office today for extended visit.  Doing well after right hip replacement. Now she is having pain with her left. Ortho still following and reports she needs her left hip replaced but she is going to wait until next year before doing this.   Review of Systems:  Review of Systems  Constitutional: Positive for weight loss (had lost weight when she had hip replacement currently taking supplements and boost to help with this- has good appeitite .). Negative for fever, chills and malaise/fatigue.  HENT: Positive for hearing loss (gradual decline in hearing) and congestion (allergic ). Negative for ear pain, nosebleeds, sore throat and ear discharge.   Eyes: Negative for blurred vision, double vision and pain.       Reports some gradual loss of vision- follow with eye MD questionable macular degeneration on last visit.   Respiratory: Negative for cough, shortness of breath and wheezing.   Cardiovascular: Positive for leg swelling. Negative for chest pain and palpitations.  Gastrointestinal: Negative for heartburn (none since she has been on prilosec), nausea, vomiting, diarrhea, constipation, blood in stool and melena.  Genitourinary: Positive for frequency (with urinary incontience ). Negative for dysuria, urgency and hematuria.  Musculoskeletal: Positive for back pain and joint pain (left hip and back).  Negative for myalgias and falls.  Skin: Positive for itching (dry skin).       Reports red bumps that appear   Neurological: Negative for dizziness, tingling, weakness and headaches.  Psychiatric/Behavioral: Negative for depression and memory loss. The patient is not nervous/anxious and does not have insomnia.      Past Medical History  Diagnosis Date  . Hemochromatosis 1987  . Stenosis of esophagus   . Dysphagia   . Muscle pain   . History of colonic polyps     NOTED 11-20-09 IN COLONOSCOPY REPORT  . Diverticulosis of sigmoid colon 11-20-2009  . Hemorrhoids, internal 11-20-09  . Hyperlipidemia     no meds required  . Tremor, essential 10-29-09  . Hemochromatosis   . PONV (postoperative nausea and vomiting)   . Hypertension     takes Metoprolol daily  . History of bronchitis 1991-1996    "chronic; related to winter" (10/18/2012)  . History of blood transfusion     "S/P colonoscopy after polyp removed; got 2 units; esophageal bleed got 1 unit; really messed up hemochromatosis" (1/7/20214)  . Occasional tremors   . Joint pain   . Joint swelling   . Bruises easily   . GERD (gastroesophageal reflux disease)     takes OMeprazole daily  . H/O hiatal hernia   . Gastric ulcer   . Vomiting     occasionally  . Urinary urgency     takes Ditropan daily  . Osteoporosis   . Dry eyes     uses  Refresh eye drops  . Rheumatic fever     hx of  . Osteoarthritis   . Osteoarthritis of right hip 10/18/2012  . Chronic lower back pain   . Depression     but doesn't require meds  . Kidney stones 1963  . Basal cell cancer     "face, legs" (10/18/2012)  . Squamous carcinoma     "LLE" (10/18/2012)   Past Surgical History  Procedure Laterality Date  . Tonsillectomy  1953  . Appendectomy  1952  . Cholecystectomy  1977  . Tubal ligation  1975  . Lumbar disc surgery  1989  . Shoulder arthroscopy w/ rotator cuff repair  1998?    "left" (10/18/2012)  . Dilation and curettage of uterus  1975  .  Cataract extraction w/ intraocular lens  implant, bilateral  2001    "both eyes" (10/18/2012)  . Esophagogastroduodenoscopy    . Colonoscopy    . Total hip arthroplasty  10/18/2012    "right" (10/18/2012)  . Skin cancer excision      "multiple" (10/18/2012)  . Total hip arthroplasty  10/18/2012    Procedure: TOTAL HIP ARTHROPLASTY;  Surgeon: Eulas Post, MD;  Location: [facility] OR;  Service: Orthopedics;  Laterality: Right;   Social History:   reports that she has never smoked. She has never used smokeless tobacco. She reports that she does not drink alcohol or use illicit drugs.  Family History  Problem Relation Age of Onset  . Hyperlipidemia Daughter     Medications: Patient's Medications  New Prescriptions   No medications on file  Previous Medications   ACETAMINOPHEN (TYLENOL) 650 MG CR TABLET    Take 650 mg by mouth daily.   CALCIUM CITRATE (CALCITRATE - DOSED IN MG ELEMENTAL CALCIUM) 950 MG TABLET    Take 1 tablet by mouth daily.   CARBOXYMETHYLCELLULOSE (REFRESH PLUS) 0.5 % SOLN    Place 1 drop into both eyes 3 (three) times daily as needed. For dry eyes   CHOLECALCIFEROL (VITAMIN D) 1000 UNITS TABLET    Take 1,000 Units by mouth 2 (two) times daily.   GABAPENTIN (NEURONTIN) 100 MG CAPSULE    Take 100 mg by mouth 4 (four) times daily.   LOSARTAN (COZAAR) 50 MG TABLET    TAKE ONE TABLET BY MOUTH EVERY DAY TO  CONTROL  BLOOD  PRESSURE   MELOXICAM (MOBIC) 7.5 MG TABLET       OMEGA-3 ACID ETHYL ESTERS (LOVAZA) 1 G CAPSULE    Take 1 g by mouth 3 (three) times daily.   OMEPRAZOLE (PRILOSEC) 20 MG CAPSULE    Take 20 mg by mouth daily.   SENNOSIDES-DOCUSATE SODIUM (SENOKOT-S) 8.6-50 MG TABLET    Take 1 tablet by mouth daily.   TRAMADOL (ULTRAM) 50 MG TABLET    Take 50 mg by mouth every 6 (six) hours as needed. For pain  Modified Medications   Modified Medication Previous Medication   METOPROLOL TARTRATE (LOPRESSOR) 25 MG TABLET metoprolol tartrate (LOPRESSOR) 25 MG tablet      Take 1 tablet  (25 mg total) by mouth daily.    Take 25 mg by mouth daily.   OXYBUTYNIN (DITROPAN) 5 MG TABLET oxybutynin (DITROPAN) 5 MG tablet      Take 1 tablet (5 mg total) by mouth 2 (two) times daily.    Take 5 mg by mouth 2 (two) times daily.   Discontinued Medications   No medications on file     Physical Exam:  Physical Exam  Constitutional: She is oriented to person, place, and time. She appears well-developed and well-nourished.  HENT:  Head: Normocephalic and atraumatic.  Right Ear: External ear normal.  Left Ear: External ear normal.  Nose: Nose normal.  Mouth/Throat: Oropharynx is clear and moist. No oropharyngeal exudate.  Eyes: Conjunctivae and EOM are normal. Pupils are equal, round, and reactive to light. Right eye exhibits no discharge. Left eye exhibits no discharge.  Neck: Normal range of motion. Neck supple. No tracheal deviation present. No thyromegaly present.  Cardiovascular: Normal rate, regular rhythm, normal heart sounds and intact distal pulses.   Pulmonary/Chest: Effort normal and breath sounds normal.  Abdominal: Soft. Bowel sounds are normal.  Musculoskeletal: She exhibits no edema.  Limited ROM to bilateral hips  Lymphadenopathy:    She has no cervical adenopathy.  Neurological: She is alert and oriented to person, place, and time.  Skin: Skin is warm and dry. No rash noted. No erythema.  Psychiatric: She has a normal mood and affect. Her behavior is normal. Thought content normal.   Filed Vitals:   01/12/13 0927  BP: 128/62  Pulse: 50  Temp: 98 F (36.7 C)  TempSrc: Oral  Resp: 18  Height: 5\' 6"  (1.676 m)  Weight: 122 lb (55.339 kg)  SpO2: 99%      Labs reviewed: Basic Metabolic Panel:  Recent Labs  16/10/96 1342 10/18/12 2039 10/19/12 0530 10/20/12 0540  NA 136  --  135 136  K 3.6  --  3.5 3.7  CL 96  --  98 101  CO2 29  --  27 29  GLUCOSE 114*  --  128* 115*  BUN 16  --  12 13  CREATININE 0.71 0.66 0.64 0.63  CALCIUM 10.1  --  8.7 8.6    Liver Function Tests:  Recent Labs  06/16/12 1252  AST 22  ALT 16  ALKPHOS 68  BILITOT 0.50  PROT 6.5  ALBUMIN 3.7   CBC:  Recent Labs  06/16/12 1252  10/20/12 0540 10/21/12 0600 12/14/12 1237  WBC 8.1  < > 8.9 8.3 7.8  NEUTROABS 4.2  --   --   --  4.4  HGB 13.5  < > 9.2* 9.2* 12.9  HCT 39.2  < > 26.6* 26.6* 37.8  MCV 97.9  < > 95.0 95.0 94.9  PLT 251  < > 224 254 244  < > = values in this interval not displayed.  Assessment/Plan Hypertension Patient is stable; continue current regimen. Will monitor and make changes as necessary.   Back pain Ongoing- using PRN tramadol which helps relieve the pain short term  Unspecified urinary incontinence Ongoing- using oxybutynin which causes dry mouth so she uses this minimally   Hyperlipidemia Diet modifications- will check labs before next visit   Hemochromatosis Cont to follow with Dr Janae Sauce   Osteoarthritis of right hip Improved after right total hip     Will get lipid panel and cmp before next visit

## 2013-01-12 NOTE — Assessment & Plan Note (Signed)
Cont to follow with Dr Janae Sauce

## 2013-01-12 NOTE — Assessment & Plan Note (Signed)
Patient is stable; continue current regimen. Will monitor and make changes as necessary.  

## 2013-01-12 NOTE — Assessment & Plan Note (Signed)
Ongoing- using oxybutynin which causes dry mouth so she uses this minimally

## 2013-01-12 NOTE — Assessment & Plan Note (Signed)
Ongoing- using PRN tramadol which helps relieve the pain short term

## 2013-01-12 NOTE — Patient Instructions (Signed)
Will get lab work before next visit Back Pain, Adult Low back pain is very common. About 1 in 5 people have back pain.The cause of low back pain is rarely dangerous. The pain often gets better over time.About half of people with a sudden onset of back pain feel better in just 2 weeks. About 8 in 10 people feel better by 6 weeks.  CAUSES Some common causes of back pain include:  Strain of the muscles or ligaments supporting the spine.  Wear and tear (degeneration) of the spinal discs.  Arthritis.  Direct injury to the back. DIAGNOSIS Most of the time, the direct cause of low back pain is not known.However, back pain can be treated effectively even when the exact cause of the pain is unknown.Answering your caregiver's questions about your overall health and symptoms is one of the most accurate ways to make sure the cause of your pain is not dangerous. If your caregiver needs more information, he or she may order lab work or imaging tests (X-rays or MRIs).However, even if imaging tests show changes in your back, this usually does not require surgery. HOME CARE INSTRUCTIONS For many people, back pain returns.Since low back pain is rarely dangerous, it is often a condition that people can learn to [facility] their own.   Remain active. It is stressful on the back to sit or stand in one place. Do not sit, drive, or stand in one place for more than 30 minutes at a time. Take short walks on level surfaces as soon as pain allows.Try to increase the length of time you walk each day.  Do not stay in bed.Resting more than 1 or 2 days can delay your recovery.  Do not avoid exercise or work.Your body is made to move.It is not dangerous to be active, even though your back may hurt.Your back will likely heal faster if you return to being active before your pain is gone.  Pay attention to your body when you bend and lift. Many people have less discomfortwhen lifting if they bend their knees, keep  the load close to their bodies,and avoid twisting. Often, the most comfortable positions are those that put less stress on your recovering back.  Find a comfortable position to sleep. Use a firm mattress and lie on your side with your knees slightly bent. If you lie on your back, put a pillow under your knees.  Only take over-the-counter or prescription medicines as directed by your caregiver. Over-the-counter medicines to reduce pain and inflammation are often the most helpful.Your caregiver may prescribe muscle relaxant drugs.These medicines help dull your pain so you can more quickly return to your normal activities and healthy exercise.  Put ice on the injured area.  Put ice in a plastic bag.  Place a towel between your skin and the bag.  Leave the ice on for 15 to 20 minutes, 3 to 4 times a day for the first 2 to 3 days. After that, ice and heat may be alternated to reduce pain and spasms.  Ask your caregiver about trying back exercises and gentle massage. This may be of some benefit.  Avoid feeling anxious or stressed.Stress increases muscle tension and can worsen back pain.It is important to recognize when you are anxious or stressed and learn ways to manage it.Exercise is a great option. SEEK MEDICAL CARE IF:  You have pain that is not relieved with rest or medicine.  You have pain that does not improve in 1 week.  You  have new symptoms.  You are generally not feeling well. SEEK IMMEDIATE MEDICAL CARE IF:   You have pain that radiates from your back into your legs.  You develop new bowel or bladder control problems.  You have unusual weakness or numbness in your arms or legs.  You develop nausea or vomiting.  You develop abdominal pain.  You feel faint. Document Released: 09/28/2005 Document Revised: 03/29/2012 Document Reviewed: 02/16/2011 [facility] Patient Information 2013 [place], [place].

## 2013-01-12 NOTE — Assessment & Plan Note (Signed)
Diet modifications- will check labs before next visit

## 2013-01-25 DIAGNOSIS — Z96649 Presence of unspecified artificial hip joint: Secondary | ICD-10-CM | POA: Diagnosis not present

## 2013-01-25 DIAGNOSIS — Z471 Aftercare following joint replacement surgery: Secondary | ICD-10-CM | POA: Diagnosis not present

## 2013-01-31 DIAGNOSIS — M25559 Pain in unspecified hip: Secondary | ICD-10-CM | POA: Diagnosis not present

## 2013-02-07 ENCOUNTER — Other Ambulatory Visit: Payer: Self-pay | Admitting: Internal Medicine

## 2013-02-28 ENCOUNTER — Other Ambulatory Visit: Payer: Self-pay | Admitting: Internal Medicine

## 2013-03-07 ENCOUNTER — Other Ambulatory Visit: Payer: Self-pay | Admitting: Internal Medicine

## 2013-04-09 ENCOUNTER — Other Ambulatory Visit: Payer: Self-pay | Admitting: Internal Medicine

## 2013-04-18 DIAGNOSIS — D1801 Hemangioma of skin and subcutaneous tissue: Secondary | ICD-10-CM | POA: Diagnosis not present

## 2013-04-18 DIAGNOSIS — L819 Disorder of pigmentation, unspecified: Secondary | ICD-10-CM | POA: Diagnosis not present

## 2013-04-18 DIAGNOSIS — L821 Other seborrheic keratosis: Secondary | ICD-10-CM | POA: Diagnosis not present

## 2013-04-19 DIAGNOSIS — M545 Low back pain: Secondary | ICD-10-CM | POA: Diagnosis not present

## 2013-05-08 ENCOUNTER — Other Ambulatory Visit: Payer: Self-pay | Admitting: Internal Medicine

## 2013-05-11 ENCOUNTER — Other Ambulatory Visit: Payer: Self-pay | Admitting: Nurse Practitioner

## 2013-05-23 ENCOUNTER — Other Ambulatory Visit: Payer: Medicare Other

## 2013-05-23 DIAGNOSIS — E785 Hyperlipidemia, unspecified: Secondary | ICD-10-CM

## 2013-05-24 LAB — LIPID PANEL
Chol/HDL Ratio: 4.8 ratio units — ABNORMAL HIGH (ref 0.0–4.4)
Cholesterol, Total: 192 mg/dL (ref 100–199)
HDL: 40 mg/dL (ref >39)
Triglycerides: 187 mg/dL — ABNORMAL HIGH (ref 0–149)

## 2013-05-24 LAB — COMPREHENSIVE METABOLIC PANEL
AST: 21 IU/L (ref 0–40)
Albumin: 4.5 g/dL (ref 3.5–4.7)
BUN: 20 mg/dL (ref 8–27)
CO2: 30 mmol/L — ABNORMAL HIGH (ref 18–29)
Calcium: 10.1 mg/dL (ref 8.6–10.2)
Chloride: 102 mmol/L (ref 97–108)
Creatinine, Ser: 0.81 mg/dL (ref 0.57–1.00)
Globulin, Total: 2.2 g/dL (ref 1.5–4.5)
Sodium: 143 mmol/L (ref 134–144)

## 2013-05-25 ENCOUNTER — Encounter: Payer: Self-pay | Admitting: Nurse Practitioner

## 2013-05-25 ENCOUNTER — Ambulatory Visit (INDEPENDENT_AMBULATORY_CARE_PROVIDER_SITE_OTHER): Payer: Medicare Other | Admitting: Nurse Practitioner

## 2013-05-25 VITALS — BP 140/60 | HR 65 | Temp 99.0°F | Resp 16 | Ht 66.0 in | Wt 124.4 lb

## 2013-05-25 DIAGNOSIS — IMO0001 Reserved for inherently not codable concepts without codable children: Secondary | ICD-10-CM

## 2013-05-25 DIAGNOSIS — R32 Unspecified urinary incontinence: Secondary | ICD-10-CM

## 2013-05-25 DIAGNOSIS — M169 Osteoarthritis of hip, unspecified: Secondary | ICD-10-CM | POA: Diagnosis not present

## 2013-05-25 DIAGNOSIS — E785 Hyperlipidemia, unspecified: Secondary | ICD-10-CM | POA: Diagnosis not present

## 2013-05-25 DIAGNOSIS — M791 Myalgia, unspecified site: Secondary | ICD-10-CM

## 2013-05-25 DIAGNOSIS — M1611 Unilateral primary osteoarthritis, right hip: Secondary | ICD-10-CM

## 2013-05-25 MED ORDER — LOSARTAN POTASSIUM 50 MG PO TABS: ORAL_TABLET | ORAL | Status: DC

## 2013-05-25 MED ORDER — METOPROLOL TARTRATE 25 MG PO TABS: 25.0000 mg | ORAL_TABLET | Freq: Every day | ORAL | Status: DC

## 2013-05-25 MED ORDER — TRAMADOL HCL 50 MG PO TABS: 50.0000 mg | ORAL_TABLET | Freq: Four times a day (QID) | ORAL | Status: DC | PRN

## 2013-05-25 MED ORDER — OXYBUTYNIN CHLORIDE 5 MG PO TABS: ORAL_TABLET | ORAL | Status: DC

## 2013-05-25 NOTE — Progress Notes (Signed)
Patient ID: Denise Lane, female   DOB: [birth year], 79 y.o.   MRN: [id number]   Allergies  Allergen Reactions  . Codeine Nausea And Vomiting  . Dilaudid [Hydromorphone Hcl] Hives and Other (See Comments)    "whelps" (10/18/2012)  . Macrodantin Hives and Other (See Comments)    "drug fever; chills; felt terrible" (10/18/2012)  . Morphine And Related Hives and Nausea And Vomiting  . Sulfa Antibiotics Hives, Rash and Other (See Comments)    "got real sick" (10/18/2012)    Chief Complaint  Patient presents with  . Follow-up    HPI: Patient is a 79 y.o. female seen in the office today for routine follow up.   OA- Had right hip replacement which helped her right leg and hip pain now she has ongoing left back, hip and leg pain which she takes gabapentin and tramadol- also uses mobic daily Urinary incontinence doing well on oxybutynin Hypertension- taking lopressor and cozaar GERD- still is having acid reflux; taking omeprazole  Overall pt reports she is doing well with no complaints at today's visit  Review of Systems:  Review of Systems  Constitutional: Negative for fever, chills and malaise/fatigue. Weight loss: had lost weight when she had hip replacement currently taking supplements and boost to help with this- has good appeitite .  HENT: Positive for hearing loss (gradual decline in hearing). Negative for ear pain, nosebleeds, congestion (allergic ), sore throat and ear discharge.   Eyes: Negative for blurred vision, double vision and pain.       Reports some gradual loss of vision- follow with eye MD questionable macular degeneration on last visit.   Respiratory: Negative for cough, shortness of breath and wheezing.   Cardiovascular: Negative for chest pain, palpitations and leg swelling.  Gastrointestinal: Negative for heartburn (none since she has been on prilosec), nausea, vomiting, diarrhea, constipation, blood in stool and melena.  Genitourinary: Positive for frequency (with  urinary incontience ). Negative for dysuria, urgency and hematuria.  Musculoskeletal: Positive for back pain and joint pain (left hip and back). Negative for myalgias and falls.  Skin: Itching: dry skin.       Reports red bumps that appear   Neurological: Negative for dizziness, tingling, weakness and headaches.  Psychiatric/Behavioral: Negative for depression and memory loss. The patient is not nervous/anxious and does not have insomnia.      Past Medical History  Diagnosis Date  . Hemochromatosis 1987  . Stenosis of esophagus   . Dysphagia   . Muscle pain   . History of colonic polyps     NOTED 11-20-09 IN COLONOSCOPY REPORT  . Diverticulosis of sigmoid colon 11-20-2009  . Hemorrhoids, internal 11-20-09  . Hyperlipidemia     no meds required  . Tremor, essential 10-29-09  . Hemochromatosis   . PONV (postoperative nausea and vomiting)   . Hypertension     takes Metoprolol daily  . History of bronchitis 1991-1996    "chronic; related to winter" (10/18/2012)  . History of blood transfusion     "S/P colonoscopy after polyp removed; got 2 units; esophageal bleed got 1 unit; really messed up hemochromatosis" (1/7/20214)  . Occasional tremors   . Joint pain   . Joint swelling   . Bruises easily   . GERD (gastroesophageal reflux disease)     takes OMeprazole daily  . H/O hiatal hernia   . Gastric ulcer   . Vomiting     occasionally  . Urinary urgency     takes Ditropan  daily  . Osteoporosis   . Dry eyes     uses Refresh eye drops  . Rheumatic fever     hx of  . Osteoarthritis   . Osteoarthritis of right hip 10/18/2012  . Chronic lower back pain   . Depression     but doesn't require meds  . Kidney stones 1963  . Basal cell cancer     "face, legs" (10/18/2012)  . Squamous carcinoma     "LLE" (10/18/2012)   Past Surgical History  Procedure Laterality Date  . Tonsillectomy  1953  . Appendectomy  1952  . Cholecystectomy  1977  . Tubal ligation  1975  . Lumbar disc surgery  1989   . Shoulder arthroscopy w/ rotator cuff repair  1998?    "left" (10/18/2012)  . Dilation and curettage of uterus  1975  . Cataract extraction w/ intraocular lens  implant, bilateral  2001    "both eyes" (10/18/2012)  . Esophagogastroduodenoscopy    . Colonoscopy    . Total hip arthroplasty  10/18/2012    "right" (10/18/2012)  . Skin cancer excision      "multiple" (10/18/2012)  . Total hip arthroplasty  10/18/2012    Procedure: TOTAL HIP ARTHROPLASTY;  Surgeon: Eulas Post, MD;  Location: [facility] OR;  Service: Orthopedics;  Laterality: Right;   Social History:   reports that she has never smoked. She has never used smokeless tobacco. She reports that she does not drink alcohol or use illicit drugs.  Family History  Problem Relation Age of Onset  . Hyperlipidemia Daughter     Medications: Patient's Medications  New Prescriptions   No medications on file  Previous Medications   ACETAMINOPHEN (TYLENOL) 650 MG CR TABLET    Take 650 mg by mouth daily.   CALCIUM CITRATE (CALCITRATE - DOSED IN MG ELEMENTAL CALCIUM) 950 MG TABLET    Take 1 tablet by mouth daily.   CARBOXYMETHYLCELLULOSE (REFRESH PLUS) 0.5 % SOLN    Place 1 drop into both eyes 3 (three) times daily as needed. For dry eyes   CHOLECALCIFEROL (VITAMIN D) 1000 UNITS TABLET    Take 1,000 Units by mouth 2 (two) times daily.   GABAPENTIN (NEURONTIN) 100 MG CAPSULE    Take 100 mg by mouth 4 (four) times daily.   MELOXICAM (MOBIC) 7.5 MG TABLET    TAKE ONE TABLET BY MOUTH TWICE DAILY AS NEEDED FOR ARTHRITIS   OMEPRAZOLE (PRILOSEC) 20 MG CAPSULE    Take 20 mg by mouth daily.   SENNOSIDES-DOCUSATE SODIUM (SENOKOT-S) 8.6-50 MG TABLET    Take 1 tablet by mouth daily.  Modified Medications   Modified Medication Previous Medication   LOSARTAN (COZAAR) 50 MG TABLET losartan (COZAAR) 50 MG tablet      TAKE ONE TABLET BY MOUTH ONCE DAILY FOR BLOOD PRESSURE    TAKE ONE TABLET BY MOUTH ONCE DAILY FOR BLOOD PRESSURE   METOPROLOL TARTRATE (LOPRESSOR) 25  MG TABLET metoprolol tartrate (LOPRESSOR) 25 MG tablet      Take 1 tablet (25 mg total) by mouth daily.    Take 1 tablet (25 mg total) by mouth daily.   OXYBUTYNIN (DITROPAN) 5 MG TABLET oxybutynin (DITROPAN) 5 MG tablet      TAKE ONE TABLET BY MOUTH TWICE DAILY    TAKE ONE TABLET BY MOUTH TWICE DAILY   TRAMADOL (ULTRAM) 50 MG TABLET traMADol (ULTRAM) 50 MG tablet      Take 1 tablet (50 mg total) by mouth every 6 (six)  hours as needed. For pain    Take 50 mg by mouth every 6 (six) hours as needed. For pain  Discontinued Medications   OMEGA-3 ACID ETHYL ESTERS (LOVAZA) 1 G CAPSULE    Take 1 g by mouth 3 (three) times daily.     Physical Exam:  Filed Vitals:   05/25/13 1003  BP: 140/60  Pulse: 65  Temp: 99 F (37.2 C)  TempSrc: Oral  Resp: 16  Height: 5\' 6"  (1.676 m)  Weight: 124 lb 6.4 oz (56.427 kg)  SpO2: 99%   Constitutional: She is oriented to person, place, and time. She appears well-developed and well-nourished.  HENT:  Head: Normocephalic and atraumatic.  Mouth/Throat: Oropharynx is clear and moist. No oropharyngeal exudate.  Eyes: Conjunctivae and EOM are normal. Pupils are equal, round, and reactive to light. Right eye exhibits no discharge. Left eye exhibits no discharge.  Neck: Normal range of motion. Neck supple. No tracheal deviation present. No thyromegaly present.  Cardiovascular: Normal rate, regular rhythm, normal heart sounds and intact distal pulses.  Pulmonary/Chest: Effort normal and breath sounds normal.  Abdominal: Soft. Bowel sounds are normal.  Musculoskeletal: She exhibits no edema.  Limited ROM to bilateral hips due to increase in pain Neurological: She is alert and oriented to person, place, and time.  Skin: Skin is warm and dry. No rash noted. No erythema.  Psychiatric: She has a normal mood and affect. Her behavior is normal. Thought content normal.    Labs reviewed: Basic Metabolic Panel:  Recent Labs  45/40/98 0530 10/20/12 0540  05/23/13 0938  NA 135 136 143  K 3.5 3.7 4.3  CL 98 101 102  CO2 27 29 30*  GLUCOSE 128* 115* 103*  BUN 12 13 20   CREATININE 0.64 0.63 0.81  CALCIUM 8.7 8.6 10.1   Liver Function Tests:  Recent Labs  06/16/12 1252 05/23/13 0938  AST 22 21  ALT 16 17  ALKPHOS 68 82  BILITOT 0.50 0.6  PROT 6.5 6.7  ALBUMIN 3.7  --    No results found for this basename: LIPASE, AMYLASE,  in the last 8760 hours No results found for this basename: AMMONIA,  in the last 8760 hours CBC:  Recent Labs  06/16/12 1252  10/20/12 0540 10/21/12 0600 12/14/12 1237  WBC 8.1  < > 8.9 8.3 7.8  NEUTROABS 4.2  --   --   --  4.4  HGB 13.5  < > 9.2* 9.2* 12.9  HCT 39.2  < > 26.6* 26.6* 37.8  MCV 97.9  < > 95.0 95.0 94.9  PLT 251  < > 224 254 244  < > = values in this interval not displayed. Lipid Panel:  Recent Labs  05/23/13 0938  HDL 40  LDLCALC 115*  TRIG 187*  CHOLHDL 4.8*     Assessment/Plan 1. Hyperlipidemia Patients cholesterol is stable; continue current regimen. Will monitor and make changes as necessary. Labs discusses with pt- to cont heart healthy diet and exercise as she can tolerate due to pain   2. Unspecified urinary incontinence Stable on oxybutynin   3. Muscle pain Has increased pain at times, pt reports over the past few days/week pain has been better- following with orthopedics   4. Osteoarthritis of right hip Improved reports overall pain has resolved   5. Hemochromatosis Has previously seen Dr Janae Sauce for this however she is no longer with that practice- pt is following up with cancer center and will be assigned a new MD

## 2013-05-25 NOTE — Patient Instructions (Signed)
Follow up in 4 months 

## 2013-06-14 ENCOUNTER — Other Ambulatory Visit (HOSPITAL_BASED_OUTPATIENT_CLINIC_OR_DEPARTMENT_OTHER): Payer: Medicare Other

## 2013-06-14 LAB — CBC WITH DIFFERENTIAL/PLATELET
BASO%: 0.5 % (ref 0.0–2.0)
Basophils Absolute: 0 10*3/uL (ref 0.0–0.1)
EOS%: 3 % (ref 0.0–7.0)
HCT: 37 % (ref 34.8–46.6)
HGB: 12.6 g/dL (ref 11.6–15.9)
LYMPH%: 32.2 % (ref 14.0–49.7)
MCH: 32.8 pg (ref 25.1–34.0)
MCHC: 34.1 g/dL (ref 31.5–36.0)
MCV: 96.1 fL (ref 79.5–101.0)
MONO%: 9.6 % (ref 0.0–14.0)
NEUT%: 54.7 % (ref 38.4–76.8)
Platelets: 247 10*3/uL (ref 145–400)
lymph#: 2.5 10*3/uL (ref 0.9–3.3)

## 2013-06-19 ENCOUNTER — Ambulatory Visit: Payer: Medicare Other | Admitting: Oncology

## 2013-06-23 ENCOUNTER — Ambulatory Visit (HOSPITAL_BASED_OUTPATIENT_CLINIC_OR_DEPARTMENT_OTHER): Payer: Medicare Other | Admitting: Internal Medicine

## 2013-06-23 ENCOUNTER — Ambulatory Visit (HOSPITAL_BASED_OUTPATIENT_CLINIC_OR_DEPARTMENT_OTHER): Payer: Medicare Other

## 2013-06-23 NOTE — Patient Instructions (Addendum)
Therapeutic Phlebotomy Therapeutic phlebotomy is the controlled removal of blood from your body for the purpose of treating a medical condition. It is similar to donating blood. Usually, about a pint (470 mL) of blood is removed. The average adult has 9 to 12 pints (4.3 to 5.7 L) of blood. Therapeutic phlebotomy may be used to treat the following medical conditions:  Hemochromatosis. This is a condition in which there is too much iron in the blood.  Polycythemia vera. This is a condition in which there are too many red cells in the blood.  Porphyria cutanea tarda. This is a disease usually passed from one generation to the next (inherited). It is a condition in which an important part of hemoglobin is not made properly. This results in the build up of abnormal amounts of porphyrins in the body.  Sickle cell disease. This is an inherited disease. It is a condition in which the red blood cells form an abnormal crescent shape rather than a round shape. LET YOUR CAREGIVER KNOW ABOUT:  Allergies.  Medicines taken including herbs, eyedrops, over-the-counter medicines, and creams.  Use of steroids (by mouth or creams).  Previous problems with anesthetics or numbing medicine.  History of blood clots.  History of bleeding or blood problems.  Previous surgery.  Possibility of pregnancy, if this applies. RISKS AND COMPLICATIONS This is a simple and safe procedure. Problems are unlikely. However, problems can occur and may include:  Nausea or lightheadedness.  Low blood pressure.  Soreness, bleeding, swelling, or bruising at the needle insertion site.  Infection. BEFORE THE PROCEDURE  This is a procedure that can be done as an outpatient. Confirm the time that you need to arrive for your procedure. Confirm whether there is a need to fast or withhold any medications. It is helpful to wear clothing with sleeves that can be raised above the elbow. A blood sample may be done to determine the  amount of red blood cells or iron in your blood. Plan ahead of time to have someone drive you home after the procedure. PROCEDURE The entire procedure from preparation through recovery takes about 1 hour. The actual collection takes about 10 to 15 minutes.  A needle will be inserted into your vein.  Tubing and a collection bag will be attached to that needle.  Blood will flow through the needle and tubing into the collection bag.  You may be asked to open and close your hand slowly and continuously during the entire collection.  Once the specified amount of blood has been removed from your body, the collection bag and tubing will be clamped.  The needle will be removed.  Pressure will be held on the site of the needle insertion to stop the bleeding. Then a bandage will be placed over the needle insertion site. AFTER THE PROCEDURE  Your recovery will be assessed and monitored. If there are no problems, as an outpatient, you should be able to go home shortly after the procedure.  Document Released: 03/02/2011 Document Revised: 12/21/2011 Document Reviewed: 03/02/2011 [employer] Patient Information 2014 [employer]  

## 2013-06-23 NOTE — Progress Notes (Signed)
1248 pt Ferritin 55. Per parameters pt to be phlebotomized if >50. 18g PIV inserted into left AC. 526cc blood removed over . Pt given juice and potato chips per pt request. Pt advised she " Blacked out " with last phlebotomy and has recently lost weight. NS ran over duration of 30 while pt being observed to ensure no blackouts. Pt tolerated procedure with mild discomfort.

## 2013-06-26 NOTE — Progress Notes (Signed)
OFFICE PROGRESS NOTE Date of Visit 06/23/2013 Physicians: A.Green, J.Edwards  INTERVAL HISTORY:   Patient is seen, alone for visit today, in yearly follow up of her hemachromatosis, which we treat with prn phlebotomy to keep ferritin <=50. Most recent ferritin was 33 on 12-14-12. She's been well since her last visit here.   Review of Systems otherwise: no bleeding or symptoms of blood clots. 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 No other recent infectious illness. No abdominal pain, good energy, no GI symptoms. Remainder of 10 point ROS negative. Objective:  Vital signs in last 24 hours:  BP 123/50  Pulse 50  Temp(Src) 98.7 F (37.1 C) (Oral)  Resp 18  Ht 5\' 6"  (1.676 m)  Wt 125 lb 1.6 oz (56.745 kg)  BMI 20.2 kg/m2  SpO2 100% Gen: Alert, NAD, able to move about the exam room without assistance.  HEENT:mucous membranes moist, pharynx normal without lesions.  LymphaticsCervical, supraclavicular, and axillary nodes normal. Resp: clear to auscultation bilaterally and normal percussion bilaterally Cardio: slightly irregular rate and rhythmn GI: soft, non-tender; bowel sounds normal; no masses,  no organomegaly Extremities: extremities normal, atraumatic, no cyanosis or edema. Stable arthritic changes to fingers bilaterally   Labs: CBC    Component Value Date/Time   WBC 7.7 06/14/2013 1014   WBC 8.3 10/21/2012 0600   RBC 3.85 06/14/2013 1014   RBC 2.80* 10/21/2012 0600   HGB 12.6 06/14/2013 1014   HGB 9.2* 10/21/2012 0600   HCT 37.0 06/14/2013 1014   HCT 26.6* 10/21/2012 0600   PLT 247 06/14/2013 1014   PLT 254 10/21/2012 0600   MCV 96.1 06/14/2013 1014   MCV 95.0 10/21/2012 0600   MCH 32.8 06/14/2013 1014   MCH 32.9 10/21/2012 0600   MCHC 34.1 06/14/2013 1014   MCHC 34.6 10/21/2012 0600   RDW 12.3 06/14/2013 1014   RDW 12.2 10/21/2012 0600   LYMPHSABS 2.5 06/14/2013 1014   MONOABS 0.7 06/14/2013 1014   EOSABS 0.2 06/14/2013 1014   BASOSABS 0.0 06/14/2013 1014     CMP     Component Value Date/Time   NA 143 05/23/2013 0938   NA 136 10/20/2012 0540   NA 142 06/16/2012 1252   K 4.3 05/23/2013 0938   K 4.1 06/16/2012 1252   CL 102 05/23/2013 0938   CL 104 06/16/2012 1252   CO2 30* 05/23/2013 0938   CO2 27 06/16/2012 1252   GLUCOSE 103* 05/23/2013 0938   GLUCOSE 115* 10/20/2012 0540   GLUCOSE 136* 06/16/2012 1252   BUN 20 05/23/2013 0938   BUN 13 10/20/2012 0540   BUN 20.0 06/16/2012 1252   CREATININE 0.81 05/23/2013 0938   CREATININE 0.9 06/16/2012 1252   CALCIUM 10.1 05/23/2013 0938   CALCIUM 9.7 06/16/2012 1252   PROT 6.7 05/23/2013 0938   PROT 6.5 06/16/2012 1252   PROT 6.9 11/17/2011 1302   ALBUMIN 3.7 06/16/2012 1252   ALBUMIN 3.5 11/17/2011 1302   AST 21 05/23/2013 0938   AST 22 06/16/2012 1252   ALT 17 05/23/2013 0938   ALT 16 06/16/2012 1252   ALKPHOS 82 05/23/2013 0938   ALKPHOS 68 06/16/2012 1252   BILITOT 0.6 05/23/2013 0938   BILITOT 0.50 06/16/2012 1252   GFRNONAA 68 05/23/2013 0938   GFRAA 79 05/23/2013 0938    Iron/TIBC/Ferritin    Component Value Date/Time   IRON 115 11/25/2009 1412   TIBC 250 11/25/2009 1412   FERRITIN 55 06/14/2013 1014   FERRITIN 33 12/14/2012 1237  Studies/Results:  No results found.  Medications: I have reviewed the patient's current medications. . Assessment/Plan:  1.Hemachromatosis: phlebotomize to keep ferritin < 50. Hematocrit is 53% therefore requiring phlebotomy. She will followup in 6 months with a CBC and ferritin 1 week prior to the followup visit. Patient is in agreement with plan.   Kourosh Jablonsky,MD  06/23/2013  5:00 pm

## 2013-07-03 DIAGNOSIS — H35039 Hypertensive retinopathy, unspecified eye: Secondary | ICD-10-CM | POA: Diagnosis not present

## 2013-07-03 DIAGNOSIS — H35379 Puckering of macula, unspecified eye: Secondary | ICD-10-CM | POA: Diagnosis not present

## 2013-07-03 DIAGNOSIS — H02839 Dermatochalasis of unspecified eye, unspecified eyelid: Secondary | ICD-10-CM | POA: Diagnosis not present

## 2013-07-03 DIAGNOSIS — H40019 Open angle with borderline findings, low risk, unspecified eye: Secondary | ICD-10-CM | POA: Diagnosis not present

## 2013-07-03 DIAGNOSIS — H353190 Nonexudative age-related macular degeneration, unspecified eye, stage unspecified: Secondary | ICD-10-CM | POA: Diagnosis not present

## 2013-07-03 DIAGNOSIS — H43399 Other vitreous opacities, unspecified eye: Secondary | ICD-10-CM | POA: Diagnosis not present

## 2013-07-04 DIAGNOSIS — Z23 Encounter for immunization: Secondary | ICD-10-CM | POA: Diagnosis not present

## 2013-07-12 ENCOUNTER — Telehealth: Payer: Self-pay | Admitting: Internal Medicine

## 2013-07-12 ENCOUNTER — Other Ambulatory Visit: Payer: Self-pay | Admitting: Medical Oncology

## 2013-07-12 NOTE — Telephone Encounter (Signed)
Pt called today re next f/u appt. No pof sent after 9/12 visit and no pof attached to office note. Pt informed message would be sent to provider/desk nurse and I would call her back re f/u. Message to desk nurse.

## 2013-07-13 ENCOUNTER — Telehealth: Payer: Self-pay | Admitting: Oncology

## 2013-08-15 ENCOUNTER — Telehealth: Payer: Self-pay | Admitting: Internal Medicine

## 2013-08-15 ENCOUNTER — Telehealth: Payer: Self-pay | Admitting: *Deleted

## 2013-08-15 NOTE — Telephone Encounter (Signed)
, °

## 2013-08-15 NOTE — Telephone Encounter (Signed)
Per staff message and POF I have scheduled appts.  JMW  

## 2013-09-26 ENCOUNTER — Ambulatory Visit: Payer: Medicare Other | Admitting: Nurse Practitioner

## 2013-09-27 ENCOUNTER — Encounter: Payer: Self-pay | Admitting: Nurse Practitioner

## 2013-09-27 ENCOUNTER — Ambulatory Visit (INDEPENDENT_AMBULATORY_CARE_PROVIDER_SITE_OTHER): Payer: Medicare Other | Admitting: Nurse Practitioner

## 2013-09-27 VITALS — BP 136/70 | HR 72 | Temp 97.8°F | Resp 12 | Wt 132.2 lb

## 2013-09-27 DIAGNOSIS — M169 Osteoarthritis of hip, unspecified: Secondary | ICD-10-CM

## 2013-09-27 DIAGNOSIS — G589 Mononeuropathy, unspecified: Secondary | ICD-10-CM

## 2013-09-27 DIAGNOSIS — E785 Hyperlipidemia, unspecified: Secondary | ICD-10-CM

## 2013-09-27 DIAGNOSIS — M1611 Unilateral primary osteoarthritis, right hip: Secondary | ICD-10-CM

## 2013-09-27 DIAGNOSIS — M1610 Unilateral primary osteoarthritis, unspecified hip: Secondary | ICD-10-CM | POA: Diagnosis not present

## 2013-09-27 DIAGNOSIS — I10 Essential (primary) hypertension: Secondary | ICD-10-CM

## 2013-09-27 DIAGNOSIS — M549 Dorsalgia, unspecified: Secondary | ICD-10-CM

## 2013-09-27 DIAGNOSIS — G629 Polyneuropathy, unspecified: Secondary | ICD-10-CM

## 2013-09-27 DIAGNOSIS — R32 Unspecified urinary incontinence: Secondary | ICD-10-CM

## 2013-09-27 DIAGNOSIS — K219 Gastro-esophageal reflux disease without esophagitis: Secondary | ICD-10-CM

## 2013-09-27 DIAGNOSIS — M1612 Unilateral primary osteoarthritis, left hip: Secondary | ICD-10-CM

## 2013-09-27 MED ORDER — OXYCODONE HCL 5 MG PO TABS: 5.0000 mg | ORAL_TABLET | Freq: Three times a day (TID) | ORAL | Status: DC | PRN

## 2013-09-27 NOTE — Progress Notes (Signed)
Patient ID: Denise Lane, female   DOB: December 04, 1929, 79 y.o.   MRN: [id number]    Allergies  Allergen Reactions  . Codeine Nausea And Vomiting  . Dilaudid [Hydromorphone Hcl] Hives and Other (See Comments)    "whelps" (10/18/2012)  . Macrodantin Hives and Other (See Comments)    "drug fever; chills; felt terrible" (10/18/2012)  . Morphine And Related Hives and Nausea And Vomiting  . Sulfa Antibiotics Hives, Rash and Other (See Comments)    "got real sick" (10/18/2012)    Chief Complaint  Patient presents with  . Medical Managment of Chronic Issues    4 month follow-up     HPI: Patient is a 79 y.o. female seen in the office today for routine follow up Increase pain in left hip but does not want surgery due to the amount of down time she has with her right Recent death in the family causes increase stress  Otherwise doing well  Review of Systems:  Review of Systems  Constitutional: Negative for fever, chills and malaise/fatigue.  HENT: Negative for congestion (allergic ), ear discharge, ear pain, nosebleeds and sore throat.   Eyes: Negative for blurred vision, double vision and pain.  Respiratory: Negative for cough, shortness of breath and wheezing.   Cardiovascular: Negative for chest pain, palpitations and leg swelling.  Gastrointestinal: Negative for heartburn (none since she has been on prilosec), nausea, vomiting, diarrhea, constipation, blood in stool and melena.  Genitourinary: Positive for frequency (with urinary incontience does well with oxybutin). Negative for dysuria, urgency and hematuria.  Musculoskeletal: Positive for back pain and joint pain (left hip and back). Negative for falls and myalgias.  Neurological: Negative for dizziness, tingling, weakness and headaches.  Psychiatric/Behavioral: Negative for depression and memory loss. The patient is not nervous/anxious and does not have insomnia.      Past Medical History  Diagnosis Date  . Hemochromatosis 1987  .  Stenosis of esophagus   . Dysphagia   . Muscle pain   . History of colonic polyps     NOTED 11-20-09 IN COLONOSCOPY REPORT  . Diverticulosis of sigmoid colon 11-20-2009  . Hemorrhoids, internal 11-20-09  . Hyperlipidemia     no meds required  . Tremor, essential 10-29-09  . Hemochromatosis   . PONV (postoperative nausea and vomiting)   . Hypertension     takes Metoprolol daily  . History of bronchitis 1991-1996    "chronic; related to winter" (10/18/2012)  . History of blood transfusion     "S/P colonoscopy after polyp removed; got 2 units; esophageal bleed got 1 unit; really messed up hemochromatosis" (1/7/20214)  . Occasional tremors   . Joint pain   . Joint swelling   . Bruises easily   . GERD (gastroesophageal reflux disease)     takes OMeprazole daily  . H/O hiatal hernia   . Gastric ulcer   . Vomiting     occasionally  . Urinary urgency     takes Ditropan daily  . Osteoporosis   . Dry eyes     uses Refresh eye drops  . Rheumatic fever     hx of  . Osteoarthritis   . Osteoarthritis of right hip 10/18/2012  . Chronic lower back pain   . Depression     but doesn't require meds  . Kidney stones 1963  . Basal cell cancer     "face, legs" (10/18/2012)  . Squamous carcinoma     "LLE" (10/18/2012)   Past Surgical History  Procedure  Laterality Date  . Tonsillectomy  1953  . Appendectomy  1952  . Cholecystectomy  1977  . Tubal ligation  1975  . Lumbar disc surgery  1989  . Shoulder arthroscopy w/ rotator cuff repair  1998?    "left" (10/18/2012)  . Dilation and curettage of uterus  1975  . Cataract extraction w/ intraocular lens  implant, bilateral  2001    "both eyes" (10/18/2012)  . Esophagogastroduodenoscopy    . Colonoscopy    . Total hip arthroplasty  10/18/2012    "right" (10/18/2012)  . Skin cancer excision      "multiple" (10/18/2012)  . Total hip arthroplasty  10/18/2012    Procedure: TOTAL HIP ARTHROPLASTY;  Surgeon: Eulas Post, MD;  Location: [facility] OR;  Service:  Orthopedics;  Laterality: Right;   Social History:   reports that she has never smoked. She has never used smokeless tobacco. She reports that she does not drink alcohol or use illicit drugs.  Family History  Problem Relation Age of Onset  . Hyperlipidemia Daughter     Medications: Patient's Medications  New Prescriptions   No medications on file  Previous Medications   ACETAMINOPHEN (TYLENOL) 650 MG CR TABLET    Take 650 mg by mouth 2 (two) times daily.    CALCIUM CITRATE (CALCITRATE - DOSED IN MG ELEMENTAL CALCIUM) 950 MG TABLET    Take 1 tablet by mouth daily.   CARBOXYMETHYLCELLULOSE (REFRESH PLUS) 0.5 % SOLN    Place 1 drop into both eyes 3 (three) times daily as needed. For dry eyes   CHOLECALCIFEROL (VITAMIN D) 1000 UNITS TABLET    Take 1,000 Units by mouth 2 (two) times daily.   GABAPENTIN (NEURONTIN) 100 MG CAPSULE    Take 100 mg by mouth 4 (four) times daily.   LOSARTAN (COZAAR) 50 MG TABLET    TAKE ONE TABLET BY MOUTH ONCE DAILY FOR BLOOD PRESSURE   METOPROLOL TARTRATE (LOPRESSOR) 25 MG TABLET    Take 1 tablet (25 mg total) by mouth daily.   OMEPRAZOLE (PRILOSEC) 20 MG CAPSULE    Take 20 mg by mouth daily.   OXYBUTYNIN (DITROPAN) 5 MG TABLET    TAKE ONE TABLET BY MOUTH TWICE DAILY  Modified Medications   Modified Medication Previous Medication   MELOXICAM (MOBIC) 7.5 MG TABLET meloxicam (MOBIC) 7.5 MG tablet      1 by mouth daily    TAKE ONE TABLET BY MOUTH TWICE DAILY AS NEEDED FOR ARTHRITIS  Discontinued Medications   TRAMADOL (ULTRAM) 50 MG TABLET    Take 1 tablet (50 mg total) by mouth every 6 (six) hours as needed. For pain     Physical Exam:  Filed Vitals:   09/27/13 1024  BP: 136/70  Pulse: 72  Temp: 97.8 F (36.6 C)  TempSrc: Oral  Resp: 12  Weight: 132 lb 3.2 oz (59.966 kg)  SpO2: 96%   Physical Exam  Constitutional: She is oriented to person, place, and time and well-developed, well-nourished, and in no distress.  HENT:  Mouth/Throat: Oropharynx  is clear and moist. No oropharyngeal exudate.  Eyes: Conjunctivae and EOM are normal. Pupils are equal, round, and reactive to light.  Neck: Normal range of motion. Neck supple.  Cardiovascular: Normal rate, regular rhythm and normal heart sounds.   Pulmonary/Chest: Effort normal and breath sounds normal. No respiratory distress.  Abdominal: Soft. Bowel sounds are normal. She exhibits no distension.  Musculoskeletal: She exhibits no edema and no tenderness.  Neurological: She is alert  and oriented to person, place, and time.  Skin: Skin is warm and dry. No erythema.  Psychiatric: Affect normal.     Labs reviewed: Basic Metabolic Panel:  Recent Labs  16/10/96 0530 10/20/12 0540 05/23/13 0938  NA 135 136 143  K 3.5 3.7 4.3  CL 98 101 102  CO2 27 29 30*  GLUCOSE 128* 115* 103*  BUN 12 13 20   CREATININE 0.64 0.63 0.81  CALCIUM 8.7 8.6 10.1   Liver Function Tests:  Recent Labs  05/23/13 0938  AST 21  ALT 17  ALKPHOS 82  BILITOT 0.6  PROT 6.7   No results found for this basename: LIPASE, AMYLASE,  in the last 8760 hours No results found for this basename: AMMONIA,  in the last 8760 hours CBC:  Recent Labs  10/21/12 0600 12/14/12 1237 06/14/13 1014  WBC 8.3 7.8 7.7  NEUTROABS  --  4.4 4.2  HGB 9.2* 12.9 12.6  HCT 26.6* 37.8 37.0  MCV 95.0 94.9 96.1  PLT 254 244 247   Lipid Panel:  Recent Labs  05/23/13 0938  HDL 40  LDLCALC 115*  TRIG 187*  CHOLHDL 4.8*     Assessment/Plan 1. Hypertension -stable on current medication  2. Osteoarthritis of hip -no pain to right; worse on left -can not tolerate ultram makes her vomit makes her feel very sick  -only taking 1 mobic and 2 tylenol for pain BID - gabapentin does not help this -has no relief at night; not able to sleep -will give new Rx for Oxy IR 5 mg   3. Unspecified urinary incontinence - on oxybutynin   4. Hemochromatosis -had phelomony in sept; following up in march with hematology  5.  Hyperlipidemia -decreased appetite; but has weight gain since last visit, tries to eat heart healthy -minimal activity due to back and hip pain  -not fasting today; will follow up on fasting lipids today  6. Back pain -has lost back brace; will go to medical supply store to see about getting another one  7. Neuropathy -improved with gabapentin   8. GERD Stable on medication   Will follow up in 6 month with [facility] or before if needed

## 2013-10-06 ENCOUNTER — Other Ambulatory Visit: Payer: Self-pay | Admitting: Nurse Practitioner

## 2013-10-08 ENCOUNTER — Other Ambulatory Visit: Payer: Self-pay | Admitting: Nurse Practitioner

## 2013-10-23 ENCOUNTER — Ambulatory Visit (HOSPITAL_BASED_OUTPATIENT_CLINIC_OR_DEPARTMENT_OTHER): Payer: Medicare Other | Admitting: Internal Medicine

## 2013-10-23 ENCOUNTER — Other Ambulatory Visit: Payer: Medicare Other | Admitting: Lab

## 2013-10-23 ENCOUNTER — Telehealth: Payer: Self-pay | Admitting: Internal Medicine

## 2013-10-23 ENCOUNTER — Ambulatory Visit: Payer: Medicare Other

## 2013-10-23 ENCOUNTER — Other Ambulatory Visit (HOSPITAL_BASED_OUTPATIENT_CLINIC_OR_DEPARTMENT_OTHER): Payer: Medicare Other

## 2013-10-23 LAB — COMPREHENSIVE METABOLIC PANEL (CC13)
ALBUMIN: 3.7 g/dL (ref 3.5–5.0)
ALT: 10 U/L (ref 0–55)
AST: 17 U/L (ref 5–34)
Alkaline Phosphatase: 75 U/L (ref 40–150)
Anion Gap: 12 mEq/L — ABNORMAL HIGH (ref 3–11)
BUN: 21.3 mg/dL (ref 7.0–26.0)
CALCIUM: 9.9 mg/dL (ref 8.4–10.4)
CHLORIDE: 104 meq/L (ref 98–109)
CO2: 28 mEq/L (ref 22–29)
Creatinine: 0.8 mg/dL (ref 0.6–1.1)
GLUCOSE: 104 mg/dL (ref 70–140)
POTASSIUM: 4.5 meq/L (ref 3.5–5.1)
Sodium: 144 mEq/L (ref 136–145)
Total Bilirubin: 0.6 mg/dL (ref 0.20–1.20)
Total Protein: 6.6 g/dL (ref 6.4–8.3)

## 2013-10-23 LAB — CBC WITH DIFFERENTIAL/PLATELET
BASO%: 0.4 % (ref 0.0–2.0)
BASOS ABS: 0 10*3/uL (ref 0.0–0.1)
EOS%: 1.8 % (ref 0.0–7.0)
Eosinophils Absolute: 0.1 10*3/uL (ref 0.0–0.5)
HEMATOCRIT: 37.6 % (ref 34.8–46.6)
HEMOGLOBIN: 12.7 g/dL (ref 11.6–15.9)
LYMPH#: 2.7 10*3/uL (ref 0.9–3.3)
LYMPH%: 35.5 % (ref 14.0–49.7)
MCH: 32.5 pg (ref 25.1–34.0)
MCHC: 33.8 g/dL (ref 31.5–36.0)
MCV: 96.3 fL (ref 79.5–101.0)
MONO#: 0.7 10*3/uL (ref 0.1–0.9)
MONO%: 9.3 % (ref 0.0–14.0)
NEUT%: 53 % (ref 38.4–76.8)
NEUTROS ABS: 4.1 10*3/uL (ref 1.5–6.5)
Platelets: 249 10*3/uL (ref 145–400)
RBC: 3.91 10*6/uL (ref 3.70–5.45)
RDW: 12.4 % (ref 11.2–14.5)
WBC: 7.7 10*3/uL (ref 3.9–10.3)

## 2013-10-23 LAB — FERRITIN CHCC: Ferritin: 36 ng/ml (ref 9–269)

## 2013-10-23 NOTE — Telephone Encounter (Signed)
gv and printed appt sched and avs for pt fro May...sed added tx.

## 2013-10-24 NOTE — Progress Notes (Signed)
OFFICE PROGRESS NOTE Date of Visit: 10/23/2013 Physicians: A.Nyoka Cowden, J.Edwards  Chief Complaint:  Hemachromatosis  Diagnosis: Hemachromatosis  Current Treatment:  Phlebotomy to keep ferritin less than 50  INTERVAL HISTORY:   Patient is seen, alone for visit today, in yearly follow up of her hemachromatosis, which we treat with prn phlebotomy to keep ferritin <=50. Most recent ferritin was 55 on 06-14-13. She's been well since her last visit here.   Review of Systems otherwise: no bleeding or symptoms of blood clots. 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 No other recent infectious illness. No abdominal pain, good energy, no GI symptoms. Remainder of 10 point ROS negative. Objective:  Vital signs in last 24 hours:  BP 164/53  Pulse 54  Temp(Src) 97.8 F (36.6 C) (Oral)  Resp 18  Ht 5\' 6"  (1.676 m)  Wt 126 lb (57.153 kg)  BMI 20.35 kg/m2 Gen: Alert, NAD, able to move about the exam room without assistance.  HEENT:mucous membranes moist, pharynx normal without lesions.  LymphaticsCervical, supraclavicular, and axillary nodes normal. Resp: clear to auscultation bilaterally and normal percussion bilaterally Cardio: slightly irregular rate and rhythmn GI: soft, non-tender; bowel sounds normal; no masses,  no organomegaly Extremities: extremities normal, atraumatic, no cyanosis or edema. Stable arthritic changes to fingers bilaterally   Labs: CBC    Component Value Date/Time   WBC 7.7 10/23/2013 1221   WBC 8.3 10/21/2012 0600   RBC 3.91 10/23/2013 1221   RBC 2.80* 10/21/2012 0600   HGB 12.7 10/23/2013 1221   HGB 9.2* 10/21/2012 0600   HCT 37.6 10/23/2013 1221   HCT 26.6* 10/21/2012 0600   PLT 249 10/23/2013 1221   PLT 254 10/21/2012 0600   MCV 96.3 10/23/2013 1221   MCV 95.0 10/21/2012 0600   MCH 32.5 10/23/2013 1221   MCH 32.9 10/21/2012 0600   MCHC 33.8 10/23/2013 1221   MCHC 34.6 10/21/2012 0600   RDW 12.4 10/23/2013 1221   RDW 12.2 10/21/2012 0600    LYMPHSABS 2.7 10/23/2013 1221   MONOABS 0.7 10/23/2013 1221   EOSABS 0.1 10/23/2013 1221   BASOSABS 0.0 10/23/2013 1221    CMP     Component Value Date/Time   NA 144 10/23/2013 1221   NA 143 05/23/2013 0938   NA 136 10/20/2012 0540   K 4.5 10/23/2013 1221   K 4.3 05/23/2013 0938   CL 102 05/23/2013 0938   CL 104 06/16/2012 1252   CO2 28 10/23/2013 1221   CO2 30* 05/23/2013 0938   GLUCOSE 104 10/23/2013 1221   GLUCOSE 103* 05/23/2013 0938   GLUCOSE 115* 10/20/2012 0540   GLUCOSE 136* 06/16/2012 1252   BUN 21.3 10/23/2013 1221   BUN 20 05/23/2013 0938   BUN 13 10/20/2012 0540   CREATININE 0.8 10/23/2013 1221   CREATININE 0.81 05/23/2013 0938   CALCIUM 9.9 10/23/2013 1221   CALCIUM 10.1 05/23/2013 0938   PROT 6.6 10/23/2013 1221   PROT 6.7 05/23/2013 0938   PROT 6.9 11/17/2011 1302   ALBUMIN 3.7 10/23/2013 1221   ALBUMIN 3.5 11/17/2011 1302   AST 17 10/23/2013 1221   AST 21 05/23/2013 0938   ALT 10 10/23/2013 1221   ALT 17 05/23/2013 0938   ALKPHOS 75 10/23/2013 1221   ALKPHOS 82 05/23/2013 0938   BILITOT 0.60 10/23/2013 1221   BILITOT 0.6 05/23/2013 0938   GFRNONAA 68 05/23/2013 0938   GFRAA 79 05/23/2013 0938    Iron/TIBC/Ferritin    Component Value Date/Time   IRON 115  11/25/2009 1412   TIBC 250 11/25/2009 1412   FERRITIN 36 10/23/2013 1221   FERRITIN 33 12/14/2012 1237    Studies/Results:  No results found.  Medications: I have reviewed the patient's current medications. . Assessment/Plan:  1.Hemachromatosis: phlebotomize to keep ferritin < 50. Hematocrit is 36% therefore not requiring phlebotomy. She will followup in 6 months with a CBC and ferritin 1 week prior to the followup visit. Patient is in agreement with plan.   Demri Poulton,MD  10/24/2013 5;39 AM

## 2013-10-31 ENCOUNTER — Telehealth: Payer: Self-pay | Admitting: Medical Oncology

## 2013-10-31 NOTE — Telephone Encounter (Signed)
Pt called for ferritin results. Results given to pt. She will not need a phlebotomy. She was pleased.

## 2013-11-16 ENCOUNTER — Other Ambulatory Visit: Payer: Self-pay | Admitting: Nurse Practitioner

## 2013-12-11 DIAGNOSIS — Z1231 Encounter for screening mammogram for malignant neoplasm of breast: Secondary | ICD-10-CM | POA: Diagnosis not present

## 2013-12-13 ENCOUNTER — Telehealth: Payer: Self-pay

## 2013-12-13 NOTE — Telephone Encounter (Signed)
Spoke with patient, Bilateral screening mammogram with CAD completed on 12/11/2013 results: There is no mammographic evidence of malignancy. A 1 year screening mammogram is recommended.   Patient verbalized understanding of results. Report was sent to scanning.

## 2013-12-26 ENCOUNTER — Encounter: Payer: Self-pay | Admitting: Internal Medicine

## 2014-01-21 ENCOUNTER — Other Ambulatory Visit: Payer: Self-pay | Admitting: Nurse Practitioner

## 2014-01-29 ENCOUNTER — Other Ambulatory Visit: Payer: Self-pay | Admitting: *Deleted

## 2014-02-08 ENCOUNTER — Encounter: Payer: Self-pay | Admitting: Nurse Practitioner

## 2014-02-08 ENCOUNTER — Ambulatory Visit (INDEPENDENT_AMBULATORY_CARE_PROVIDER_SITE_OTHER): Payer: Medicare Other | Admitting: Nurse Practitioner

## 2014-02-08 VITALS — BP 106/68 | HR 55 | Temp 98.2°F | Wt 115.4 lb

## 2014-02-08 DIAGNOSIS — R5383 Other fatigue: Principal | ICD-10-CM

## 2014-02-08 DIAGNOSIS — G589 Mononeuropathy, unspecified: Secondary | ICD-10-CM

## 2014-02-08 DIAGNOSIS — F32A Depression, unspecified: Secondary | ICD-10-CM | POA: Insufficient documentation

## 2014-02-08 DIAGNOSIS — M1990 Unspecified osteoarthritis, unspecified site: Secondary | ICD-10-CM | POA: Insufficient documentation

## 2014-02-08 DIAGNOSIS — R5381 Other malaise: Secondary | ICD-10-CM

## 2014-02-08 DIAGNOSIS — F329 Major depressive disorder, single episode, unspecified: Secondary | ICD-10-CM | POA: Insufficient documentation

## 2014-02-08 DIAGNOSIS — M549 Dorsalgia, unspecified: Secondary | ICD-10-CM

## 2014-02-08 DIAGNOSIS — N318 Other neuromuscular dysfunction of bladder: Secondary | ICD-10-CM

## 2014-02-08 DIAGNOSIS — F341 Dysthymic disorder: Secondary | ICD-10-CM

## 2014-02-08 DIAGNOSIS — F419 Anxiety disorder, unspecified: Secondary | ICD-10-CM

## 2014-02-08 DIAGNOSIS — N3281 Overactive bladder: Secondary | ICD-10-CM

## 2014-02-08 DIAGNOSIS — G629 Polyneuropathy, unspecified: Secondary | ICD-10-CM | POA: Insufficient documentation

## 2014-02-08 MED ORDER — MIRABEGRON ER 25 MG PO TB24: 25.0000 mg | ORAL_TABLET | Freq: Every day | ORAL | Status: DC

## 2014-02-08 MED ORDER — OXYCODONE HCL 5 MG PO TABS: 5.0000 mg | ORAL_TABLET | Freq: Four times a day (QID) | ORAL | Status: DC | PRN

## 2014-02-08 MED ORDER — GABAPENTIN 300 MG PO CAPS: ORAL_CAPSULE | ORAL | Status: DC

## 2014-02-08 MED ORDER — VENLAFAXINE HCL ER 37.5 MG PO CP24: 37.5000 mg | ORAL_CAPSULE | Freq: Every day | ORAL | Status: DC

## 2014-02-08 NOTE — Progress Notes (Signed)
Patient ID: Denise Lane, female   DOB: Apr 08, 1930, 80 y.o.   MRN: [id number]    Allergies  Allergen Reactions  . Codeine Nausea And Vomiting  . Dilaudid [Hydromorphone Hcl] Hives and Other (See Comments)    "whelps" (10/18/2012)  . Macrodantin Hives and Other (See Comments)    "drug fever; chills; felt terrible" (10/18/2012)  . Morphine And Related Hives and Nausea And Vomiting  . Sulfa Antibiotics Hives, Rash and Other (See Comments)    "got real sick" (10/18/2012)    Chief Complaint  Patient presents with  . Acute Visit    pain in LT leg/hip and not much appitite  . other    needs refill on Tramadol which is not on medication list, gentitic testing results    HPI: Patient is a 80 y.o. female seen in the office today for routine follow up Daughter here with patient. Pt reports increase in pain in left knee and hip- reports pain is a 10. Ortho recommended surgery but she can not leave her husband due to his dementia and there is no other options so she is unable to have surgery. Pain is so bad she can not do anything. Not cooking or cleaning and this along with having to take care of husband with dementia has made her depressed and anxious.  -also with dry mouth, taking oxybutynin   Review of Systems:  Review of Systems  Constitutional: Positive for weight loss and malaise/fatigue. Negative for fever and chills.  HENT: Negative for congestion (allergic ), ear discharge, ear pain, nosebleeds and sore throat.   Eyes: Negative for blurred vision.  Respiratory: Negative for cough, shortness of breath and wheezing.   Cardiovascular: Negative for chest pain, palpitations and leg swelling.  Gastrointestinal: Negative for heartburn (none since she has been on prilosec), nausea, vomiting, diarrhea, constipation, blood in stool and melena.  Genitourinary: Positive for frequency (with urinary incontience has done does well with oxybutin in hte past now causing bad dry mouth). Negative for  dysuria, urgency and hematuria.  Musculoskeletal: Positive for back pain and joint pain (left hip and back). Negative for falls and myalgias.  Neurological: Positive for tingling (numbness and tingling down left leg) and weakness. Negative for dizziness and headaches.  Psychiatric/Behavioral: Positive for depression. Negative for memory loss. The patient is nervous/anxious. The patient does not have insomnia.      Past Medical History  Diagnosis Date  . Hemochromatosis 1987  . Stenosis of esophagus   . Dysphagia   . Muscle pain   . History of colonic polyps     NOTED 11-20-09 IN COLONOSCOPY REPORT  . Diverticulosis of sigmoid colon 11-20-2009  . Hemorrhoids, internal 11-20-09  . Hyperlipidemia     no meds required  . Tremor, essential 10-29-09  . Hemochromatosis   . PONV (postoperative nausea and vomiting)   . Hypertension     takes Metoprolol daily  . History of bronchitis 1991-1996    "chronic; related to winter" (10/18/2012)  . History of blood transfusion     "S/P colonoscopy after polyp removed; got 2 units; esophageal bleed got 1 unit; really messed up hemochromatosis" (1/7/20214)  . Occasional tremors   . Joint pain   . Joint swelling   . Bruises easily   . GERD (gastroesophageal reflux disease)     takes OMeprazole daily  . H/O hiatal hernia   . Gastric ulcer   . Vomiting     occasionally  . Urinary urgency  takes Ditropan daily  . Osteoporosis   . Dry eyes     uses Refresh eye drops  . Rheumatic fever     hx of  . Osteoarthritis   . Osteoarthritis of right hip 10/18/2012  . Chronic lower back pain   . Depression     but doesn't require meds  . Kidney stones 1963  . Basal cell cancer     "face, legs" (10/18/2012)  . Squamous carcinoma     "LLE" (10/18/2012)   Past Surgical History  Procedure Laterality Date  . Tonsillectomy  1953  . Appendectomy  1952  . Cholecystectomy  1977  . Tubal ligation  1975  . Lumbar disc surgery  1989  . Shoulder arthroscopy w/  rotator cuff repair  1998?    "left" (10/18/2012)  . Dilation and curettage of uterus  1975  . Cataract extraction w/ intraocular lens  implant, bilateral  2001    "both eyes" (10/18/2012)  . Esophagogastroduodenoscopy    . Colonoscopy    . Total hip arthroplasty  10/18/2012    "right" (10/18/2012)  . Skin cancer excision      "multiple" (10/18/2012)  . Total hip arthroplasty  10/18/2012    Procedure: TOTAL HIP ARTHROPLASTY;  Surgeon: Eulas Post, MD;  Location: [facility] OR;  Service: Orthopedics;  Laterality: Right;   Social History:   reports that she has never smoked. She has never used smokeless tobacco. She reports that she does not drink alcohol or use illicit drugs.  Family History  Problem Relation Age of Onset  . Hyperlipidemia Daughter     Medications: Patient's Medications  New Prescriptions   No medications on file  Previous Medications   ACETAMINOPHEN (TYLENOL) 650 MG CR TABLET    Take 650 mg by mouth 2 (two) times daily.    CALCIUM CITRATE (CALCITRATE - DOSED IN MG ELEMENTAL CALCIUM) 950 MG TABLET    Take 1 tablet by mouth daily.   CARBOXYMETHYLCELLULOSE (REFRESH PLUS) 0.5 % SOLN    Place 1 drop into both eyes 3 (three) times daily as needed. For dry eyes   CHOLECALCIFEROL (VITAMIN D) 1000 UNITS TABLET    Take 1,000 Units by mouth 2 (two) times daily.   GABAPENTIN (NEURONTIN) 100 MG CAPSULE    TAKE ONE CAPSULE BY MOUTH EVERY 8 HOURS AS NEEDED FOR NERVE PAIN   LOSARTAN (COZAAR) 50 MG TABLET    TAKE ONE TABLET BY MOUTH ONCE DAILY FOR BLOOD PRESSURE   MELOXICAM (MOBIC) 7.5 MG TABLET    1 by mouth daily   METOPROLOL TARTRATE (LOPRESSOR) 25 MG TABLET    Take 1 tablet (25 mg total) by mouth daily.   OMEPRAZOLE (PRILOSEC) 20 MG CAPSULE    Take 20 mg by mouth daily.   OXYBUTYNIN (DITROPAN) 5 MG TABLET    TAKE ONE TABLET BY MOUTH TWICE DAILY   OXYCODONE (OXY IR/ROXICODONE) 5 MG IMMEDIATE RELEASE TABLET    Take 1 tablet (5 mg total) by mouth every 8 (eight) hours as needed for severe pain.  Every night as needed for severe pain  Modified Medications   No medications on file  Discontinued Medications   METOPROLOL TARTRATE (LOPRESSOR) 25 MG TABLET    TAKE ONE TABLET BY MOUTH ONCE DAILY     Physical Exam:  Filed Vitals:   02/08/14 0958  BP: 106/68  Pulse: 55  Temp: 98.2 F (36.8 C)  TempSrc: Oral  Weight: 115 lb 6.4 oz (52.345 kg)  SpO2: 98%  Physical Exam  Constitutional: She is oriented to person, place, and time and well-developed, well-nourished, and in no distress.  HENT:  Mouth/Throat: Oropharynx is clear and moist. No oropharyngeal exudate.  Eyes: Conjunctivae and EOM are normal. Pupils are equal, round, and reactive to light.  Neck: Normal range of motion. Neck supple.  Cardiovascular: Normal rate, regular rhythm and normal heart sounds.   Pulmonary/Chest: Effort normal and breath sounds normal. No respiratory distress.  Abdominal: Soft. Bowel sounds are normal. She exhibits no distension.  Musculoskeletal: She exhibits tenderness (to left knee, and back). She exhibits no edema.  Neurological: She is alert and oriented to person, place, and time.  Skin: Skin is warm and dry.  Psychiatric: Affect normal.     Labs reviewed: Basic Metabolic Panel:  Recent Labs  05/23/13 0938 10/23/13 1221  NA 143 144  K 4.3 4.5  CL 102  --   CO2 30* 28  GLUCOSE 103* 104  BUN 20 21.3  CREATININE 0.81 0.8  CALCIUM 10.1 9.9   Liver Function Tests:  Recent Labs  05/23/13 0938 10/23/13 1221  AST 21 17  ALT 17 10  ALKPHOS 82 75  BILITOT 0.6 0.60  PROT 6.7 6.6  ALBUMIN  --  3.7   No results found for this basename: LIPASE, AMYLASE,  in the last 8760 hours No results found for this basename: AMMONIA,  in the last 8760 hours CBC:  Recent Labs  06/14/13 1014 10/23/13 1221  WBC 7.7 7.7  NEUTROABS 4.2 4.1  HGB 12.6 12.7  HCT 37.0 37.6  MCV 96.1 96.3  PLT 247 249   Lipid Panel:  Recent Labs  05/23/13 0938  HDL 40  LDLCALC 115*  TRIG 187*    CHOLHDL 4.8*   TSH: No results found for this basename: TSH,  in the last 8760 hours A1C: No components found with this basename: A1C,    Assessment/Plan  1. Osteoarthritis With back, hip and knee pain -does not take medication routinely until pain is 10 and unable to control.  -educated on staying ahead of pain since this is chronic and surgery is not an option at this time.   - oxyCODONE (OXY IR/ROXICODONE) 5 MG immediate release tablet; Take 1 tablet (5 mg total) by mouth every 6 (six) hours as needed for moderate pain or severe pain. Every night as needed for severe pain  Dispense: 120 tablet; Refill: 0  2. Overactive bladder -will extermely bad try mouth, will stop oxybutynin and start myrbetriq, samples given  - mirabegron ER (MYRBETRIQ) 25 MG TB24 tablet; Take 1 tablet (25 mg total) by mouth daily.  Dispense: 30 tablet  3. Neuropathy -worse, pt with numbness and tingling down left leg, will increase neurontin - gabapentin (NEURONTIN) 300 MG capsule; TAKE ONE CAPSULE BY MOUTH EVERY 8 HOURS AS NEEDED FOR NERVE PAIN  Dispense: 180 capsule; Refill: 0  4. Anxiety and depression -pt with increased pain, primary care taker for husband with dementia -daughter at visit request effexor, okay to try this -educated on side effects and for pt to notify us immediately - venlafaxine XR (EFFEXOR-XR) 37.5 MG 24 hr capsule; Take 1 capsule (37.5 mg total) by mouth daily with breakfast.  Dispense: 30 capsule; Refill: 1  5. Other malaise and fatigue -worse due to pain, being caregiver, with some depression now, reports passible abnormal TSH in the past? Will follow up blood work today - TSH - CBC With differential/Platelet - Comprehensive metabolic panel  To keep already scheduled follow  up for OAB, pain and depression re-evaluation

## 2014-02-08 NOTE — Patient Instructions (Signed)
Today start taking neuronin 300 mg up to 3 times daily Monday start Effexor 37.5 by mouth daily  After 1 week of being on effexor start myrbetriq

## 2014-02-09 LAB — CBC WITH DIFFERENTIAL
Basophils Absolute: 0 10*3/uL (ref 0.0–0.2)
Basos: 0 %
EOS ABS: 0.1 10*3/uL (ref 0.0–0.4)
Eos: 2 %
HEMATOCRIT: 34.9 % (ref 34.0–46.6)
HEMOGLOBIN: 12.1 g/dL (ref 11.1–15.9)
Immature Grans (Abs): 0 10*3/uL (ref 0.0–0.1)
Immature Granulocytes: 0 %
Lymphocytes Absolute: 2.3 10*3/uL (ref 0.7–3.1)
Lymphs: 29 %
MCH: 31.9 pg (ref 26.6–33.0)
MCHC: 34.7 g/dL (ref 31.5–35.7)
MCV: 92 fL (ref 79–97)
MONOS ABS: 0.7 10*3/uL (ref 0.1–0.9)
Monocytes: 9 %
NEUTROS ABS: 4.8 10*3/uL (ref 1.4–7.0)
NEUTROS PCT: 60 %
Platelets: 299 10*3/uL (ref 150–379)
RBC: 3.79 x10E6/uL (ref 3.77–5.28)
RDW: 12.2 % — AB (ref 12.3–15.4)
WBC: 8 10*3/uL (ref 3.4–10.8)

## 2014-02-09 LAB — TSH: TSH: 1.8 u[IU]/mL (ref 0.450–4.500)

## 2014-02-09 LAB — COMPREHENSIVE METABOLIC PANEL
A/G RATIO: 1.9 (ref 1.1–2.5)
ALK PHOS: 83 IU/L (ref 39–117)
ALT: 9 IU/L (ref 0–32)
AST: 17 IU/L (ref 0–40)
Albumin: 4.1 g/dL (ref 3.5–4.7)
BILIRUBIN TOTAL: 0.5 mg/dL (ref 0.0–1.2)
BUN / CREAT RATIO: 45 — AB (ref 11–26)
BUN: 32 mg/dL — ABNORMAL HIGH (ref 8–27)
CO2: 28 mmol/L (ref 18–29)
Calcium: 10 mg/dL (ref 8.7–10.3)
Chloride: 95 mmol/L — ABNORMAL LOW (ref 97–108)
Creatinine, Ser: 0.71 mg/dL (ref 0.57–1.00)
GFR calc non Af Amer: 80 mL/min/{1.73_m2} (ref >59)
GFR, EST AFRICAN AMERICAN: 92 mL/min/{1.73_m2} (ref >59)
Globulin, Total: 2.2 g/dL (ref 1.5–4.5)
Glucose: 97 mg/dL (ref 65–99)
POTASSIUM: 4.2 mmol/L (ref 3.5–5.2)
SODIUM: 137 mmol/L (ref 134–144)
Total Protein: 6.3 g/dL (ref 6.0–8.5)

## 2014-02-12 ENCOUNTER — Ambulatory Visit (HOSPITAL_BASED_OUTPATIENT_CLINIC_OR_DEPARTMENT_OTHER): Payer: Medicare Other | Admitting: Internal Medicine

## 2014-02-12 ENCOUNTER — Other Ambulatory Visit: Payer: Self-pay | Admitting: Internal Medicine

## 2014-02-12 ENCOUNTER — Telehealth: Payer: Self-pay | Admitting: *Deleted

## 2014-02-12 ENCOUNTER — Ambulatory Visit: Payer: Medicare Other | Admitting: Internal Medicine

## 2014-02-12 ENCOUNTER — Telehealth: Payer: Self-pay | Admitting: Internal Medicine

## 2014-02-12 ENCOUNTER — Other Ambulatory Visit (HOSPITAL_BASED_OUTPATIENT_CLINIC_OR_DEPARTMENT_OTHER): Payer: Medicare Other

## 2014-02-12 ENCOUNTER — Other Ambulatory Visit: Payer: Medicare Other

## 2014-02-12 LAB — CBC WITH DIFFERENTIAL/PLATELET
BASO%: 0.3 % (ref 0.0–2.0)
Basophils Absolute: 0 10*3/uL (ref 0.0–0.1)
EOS%: 1.7 % (ref 0.0–7.0)
Eosinophils Absolute: 0.1 10*3/uL (ref 0.0–0.5)
HCT: 36.7 % (ref 34.8–46.6)
HGB: 12.5 g/dL (ref 11.6–15.9)
LYMPH#: 2.2 10*3/uL (ref 0.9–3.3)
LYMPH%: 27.9 % (ref 14.0–49.7)
MCH: 32.8 pg (ref 25.1–34.0)
MCHC: 34 g/dL (ref 31.5–36.0)
MCV: 96.3 fL (ref 79.5–101.0)
MONO#: 0.7 10*3/uL (ref 0.1–0.9)
MONO%: 8.4 % (ref 0.0–14.0)
NEUT#: 4.9 10*3/uL (ref 1.5–6.5)
NEUT%: 61.7 % (ref 38.4–76.8)
Platelets: 281 10*3/uL (ref 145–400)
RBC: 3.81 10*6/uL (ref 3.70–5.45)
RDW: 12.3 % (ref 11.2–14.5)
WBC: 8 10*3/uL (ref 3.9–10.3)

## 2014-02-12 LAB — COMPREHENSIVE METABOLIC PANEL (CC13)
ALT: 8 U/L (ref 0–55)
ANION GAP: 8 meq/L (ref 3–11)
AST: 16 U/L (ref 5–34)
Albumin: 3.6 g/dL (ref 3.5–5.0)
Alkaline Phosphatase: 84 U/L (ref 40–150)
BUN: 18.6 mg/dL (ref 7.0–26.0)
CALCIUM: 10.3 mg/dL (ref 8.4–10.4)
CHLORIDE: 103 meq/L (ref 98–109)
CO2: 31 meq/L — AB (ref 22–29)
Creatinine: 0.7 mg/dL (ref 0.6–1.1)
Glucose: 109 mg/dl (ref 70–140)
Potassium: 4.5 mEq/L (ref 3.5–5.1)
SODIUM: 141 meq/L (ref 136–145)
Total Bilirubin: 0.63 mg/dL (ref 0.20–1.20)
Total Protein: 6.4 g/dL (ref 6.4–8.3)

## 2014-02-12 LAB — FERRITIN CHCC: Ferritin: 79 ng/ml (ref 9–269)

## 2014-02-12 NOTE — Telephone Encounter (Signed)
gve the pt her 6mnth f/u appt °

## 2014-02-12 NOTE — Telephone Encounter (Signed)
Per staff message and POF I have scheduled appts.  JMW  

## 2014-02-12 NOTE — Progress Notes (Signed)
[facility] OFFICE PROGRESS NOTE  GREEN, [name], MD [address] [address]  DIAGNOSIS: Hemochromatosis  Chief Complaint  Patient presents with  . Hemochromatosis    CURRENT TREATMENT: Prn phlebotomy to keep ferritin <=50   INTERVAL HISTORY: Denise Lane 80 y.o. female with a history of hemochromatosis is here for follow up. She denies bleeding or symptoms of blood clots. She reports continued chronic arthritis in hands.  She denies abdominal pain.  She endorses good energy without GI symptoms.  She reports her husband recently had a stroke.  She is now doing all the driving. She reports decreased energy and feeling tired.  She has also lost some weight.  She is driving boost.  She will follow up with Dr. Nyoka Cowden on 03/29/2014. She has pain in her left hip.  She reports dry mouth secondary to oxybutinin.  She plans on switching the oxybutinin to another medication.   MEDICAL HISTORY: Past Medical History  Diagnosis Date  . Hemochromatosis 1987  . Stenosis of esophagus   . Dysphagia   . Muscle pain   . History of colonic polyps     NOTED 11-20-09 IN COLONOSCOPY REPORT  . Diverticulosis of sigmoid colon 11-20-2009  . Hemorrhoids, internal 11-20-09  . Hyperlipidemia     no meds required  . Tremor, essential 10-29-09  . Hemochromatosis   . PONV (postoperative nausea and vomiting)   . Hypertension     takes Metoprolol daily  . History of bronchitis 1991-1996    "chronic; related to winter" (10/18/2012)  . History of blood transfusion     "S/P colonoscopy after polyp removed; got 2 units; esophageal bleed got 1 unit; really messed up hemochromatosis" (1/7/20214)  . Occasional tremors   . Joint pain   . Joint swelling   . Bruises easily   . GERD (gastroesophageal reflux disease)     takes OMeprazole daily  . H/O hiatal hernia   . Gastric ulcer   . Vomiting     occasionally  . Urinary urgency     takes Ditropan daily  . Osteoporosis   . Dry  eyes     uses Refresh eye drops  . Rheumatic fever     hx of  . Osteoarthritis   . Osteoarthritis of right hip 10/18/2012  . Chronic lower back pain   . Depression     but doesn't require meds  . Kidney stones 1963  . Basal cell cancer     "face, legs" (10/18/2012)  . Squamous carcinoma     "LLE" (10/18/2012)    INTERIM HISTORY: has Hemochromatosis; Osteoarthritis of right hip; Muscle pain; Hyperlipidemia; Hypertension; Unspecified urinary incontinence; Back pain; Osteoarthritis; Neuropathy; and Anxiety and depression on her problem list.    ALLERGIES:  is allergic to codeine; dilaudid; macrodantin; morphine and related; and sulfa antibiotics.  MEDICATIONS: has a current medication list which includes the following prescription(s): acetaminophen, calcium citrate, carboxymethylcellulose, cholecalciferol, gabapentin, losartan, metoprolol tartrate, mirabegron er, omeprazole, oxycodone, and venlafaxine xr.  SURGICAL HISTORY:  Past Surgical History  Procedure Laterality Date  . Tonsillectomy  1953  . Appendectomy  1952  . Cholecystectomy  1977  . Tubal ligation  1975  . Lumbar disc surgery  1989  . Shoulder arthroscopy w/ rotator cuff repair  1998?    "left" (10/18/2012)  . Dilation and curettage of uterus  1975  . Cataract extraction w/ intraocular lens  implant, bilateral  2001    "both eyes" (10/18/2012)  .  Esophagogastroduodenoscopy    . Colonoscopy    . Total hip arthroplasty  10/18/2012    "right" (10/18/2012)  . Skin cancer excision      "multiple" (10/18/2012)  . Total hip arthroplasty  10/18/2012    Procedure: TOTAL HIP ARTHROPLASTY;  Surgeon: Johnny Bridge, MD;  Location: [facility];  Service: Orthopedics;  Laterality: Right;    REVIEW OF SYSTEMS:   Constitutional: Denies fevers, chills or abnormal weight loss Eyes: Denies blurriness of vision Ears, nose, mouth, throat, and face: Denies mucositis or sore throat Respiratory: Denies cough, dyspnea or wheezes Cardiovascular: Denies  palpitation, chest discomfort or lower extremity swelling Gastrointestinal:  Denies nausea, heartburn or change in bowel habits Skin: Denies abnormal skin rashes Lymphatics: Denies new lymphadenopathy or easy bruising Neurological:Denies numbness, tingling or new weaknesses Behavioral/Psych: Mood is stable, no new changes  All other systems were reviewed with the patient and are negative.  PHYSICAL EXAMINATION: ECOG PERFORMANCE STATUS: 1 - Symptomatic but completely ambulatory  Blood pressure 133/56, pulse 59, temperature 98.8 F (37.1 C), temperature source Oral, resp. rate 18, height 5\' 6"  (1.676 m), weight 115 lb 11.2 oz (52.481 kg).  GENERAL:alert, no distress and comfortable; elderly, thin and ambulates with a cane SKIN: skin color, texture, turgor are normal, no rashes or significant lesions EYES: normal, Conjunctiva are pink and non-injected, sclera clear OROPHARYNX:no exudate, no erythema and lips, buccal mucosa, and tongue normal  NECK: supple, thyroid normal size, non-tender, without nodularity LYMPH:  no palpable lymphadenopathy in the cervical, axillary or supraclavicular LUNGS: clear to auscultation with normal breathing effort, no wheezes or rhonchi HEART: regular rate & rhythm and no murmurs and no lower extremity edema ABDOMEN:abdomen soft, non-tender and normal bowel sounds Musculoskeletal:no cyanosis of digits and no clubbing;Stable arthritic changes to fingers bilaterally NEURO: alert & oriented x 3 with fluent speech, no focal motor/sensory deficits  Labs:  Lab Results  Component Value Date   WBC 8.0 02/12/2014   HGB 12.5 02/12/2014   HCT 36.7 02/12/2014   MCV 96.3 02/12/2014   PLT 281 02/12/2014   NEUTROABS 4.9 02/12/2014      Chemistry      Component Value Date/Time   NA 137 02/08/2014 1128   NA 144 10/23/2013 1221   NA 136 10/20/2012 0540   K 4.2 02/08/2014 1128   K 4.5 10/23/2013 1221   CL 95* 02/08/2014 1128   CL 104 06/16/2012 1252   CO2 28 02/08/2014 1128   CO2 28  10/23/2013 1221   BUN 32* 02/08/2014 1128   BUN 21.3 10/23/2013 1221   BUN 13 10/20/2012 0540   CREATININE 0.71 02/08/2014 1128   CREATININE 0.8 10/23/2013 1221      Component Value Date/Time   CALCIUM 10.0 02/08/2014 1128   CALCIUM 9.9 10/23/2013 1221   ALKPHOS 83 02/08/2014 1128   ALKPHOS 75 10/23/2013 1221   AST 17 02/08/2014 1128   AST 17 10/23/2013 1221   ALT 9 02/08/2014 1128   ALT 10 10/23/2013 1221   BILITOT 0.5 02/08/2014 1128   BILITOT 0.60 10/23/2013 1221       Basic Metabolic Panel:  Recent Labs Lab 02/08/14 1128  NA 137  K 4.2  CL 95*  CO2 28  GLUCOSE 97  BUN 32*  CREATININE 0.71  CALCIUM 10.0   GFR Estimated Creatinine Clearance: 44.9 ml/min (by C-G formula based on Cr of 0.71). Liver Function Tests:  Recent Labs Lab 02/08/14 1128  AST 17  ALT 9  ALKPHOS 83  BILITOT 0.5  PROT 6.3   No results found for this basename: LIPASE, AMYLASE,  in the last 168 hours No results found for this basename: AMMONIA,  in the last 168 hours Coagulation profile No results found for this basename: INR, PROTIME,  in the last 168 hours  CBC:  Recent Labs Lab 02/08/14 1128 02/12/14 0956  WBC 8.0 8.0  NEUTROABS 4.8 4.9  HGB 12.1 12.5  HCT 34.9 36.7  MCV 92 96.3  PLT 299 281    Anemia work up No results found for this basename: VITAMINB12, FOLATE, FERRITIN, TIBC, IRON, RETICCTPCT,  in the last 72 hours  Studies:  No results found.   RADIOGRAPHIC STUDIES: No results found.  ASSESSMENT: Denise Lane 80 y.o. female with a history of Hemochromatosis   PLAN:  1.Hemachromatosis: phlebotomize to keep ferritin < 50. Hematocrit is 36.7% therefore not requiring phlebotomy. She will followup in 6 months with a CBC and ferritin 1 week prior to the followup visit. Patient is in agreement with plan.   All questions were answered. The patient knows to call the clinic with any problems, questions or concerns. We can certainly see the patient much sooner if  necessary.  I spent 10 minutes counseling the patient face to face. The total time spent in the appointment was 15 minutes.    Concha Norway, MD 02/12/2014 10:14 AM

## 2014-02-13 ENCOUNTER — Telehealth: Payer: Self-pay

## 2014-02-13 ENCOUNTER — Telehealth: Payer: Self-pay | Admitting: Internal Medicine

## 2014-02-13 NOTE — Telephone Encounter (Signed)
Pt called asking about phlebotomy appt. Stated appt is at 54. If she cannot make it she needs to call us and reschedule for another day.

## 2014-02-13 NOTE — Telephone Encounter (Signed)
, °

## 2014-02-14 ENCOUNTER — Ambulatory Visit (HOSPITAL_BASED_OUTPATIENT_CLINIC_OR_DEPARTMENT_OTHER): Payer: Medicare Other

## 2014-02-14 ENCOUNTER — Encounter: Payer: Self-pay | Admitting: Specialist

## 2014-02-14 NOTE — Patient Instructions (Signed)
Therapeutic Phlebotomy Therapeutic phlebotomy is the controlled removal of blood from your body for the purpose of treating a medical condition. It is similar to donating blood. Usually, about a pint (470 mL) of blood is removed. The average adult has 9 to 12 pints (4.3 to 5.7 L) of blood. Therapeutic phlebotomy may be used to treat the following medical conditions:  Hemochromatosis. This is a condition in which there is too much iron in the blood.  Polycythemia vera. This is a condition in which there are too many red cells in the blood.  Porphyria cutanea tarda. This is a disease usually passed from one generation to the next (inherited). It is a condition in which an important part of hemoglobin is not made properly. This results in the build up of abnormal amounts of porphyrins in the body.  Sickle cell disease. This is an inherited disease. It is a condition in which the red blood cells form an abnormal crescent shape rather than a round shape. LET YOUR CAREGIVER KNOW ABOUT:  Allergies.  Medicines taken including herbs, eyedrops, over-the-counter medicines, and creams.  Use of steroids (by mouth or creams).  Previous problems with anesthetics or numbing medicine.  History of blood clots.  History of bleeding or blood problems.  Previous surgery.  Possibility of pregnancy, if this applies. RISKS AND COMPLICATIONS This is a simple and safe procedure. Problems are unlikely. However, problems can occur and may include:  Nausea or lightheadedness.  Low blood pressure.  Soreness, bleeding, swelling, or bruising at the needle insertion site.  Infection. BEFORE THE PROCEDURE  This is a procedure that can be done as an outpatient. Confirm the time that you need to arrive for your procedure. Confirm whether there is a need to fast or withhold any medications. It is helpful to wear clothing with sleeves that can be raised above the elbow. A blood sample may be done to determine the  amount of red blood cells or iron in your blood. Plan ahead of time to have someone drive you home after the procedure. PROCEDURE The entire procedure from preparation through recovery takes about 1 hour. The actual collection takes about 10 to 15 minutes.  A needle will be inserted into your vein.  Tubing and a collection bag will be attached to that needle.  Blood will flow through the needle and tubing into the collection bag.  You may be asked to open and close your hand slowly and continuously during the entire collection.  Once the specified amount of blood has been removed from your body, the collection bag and tubing will be clamped.  The needle will be removed.  Pressure will be held on the site of the needle insertion to stop the bleeding. Then a bandage will be placed over the needle insertion site. AFTER THE PROCEDURE  Your recovery will be assessed and monitored. If there are no problems, as an outpatient, you should be able to go home shortly after the procedure.  Document Released: 03/02/2011 Document Revised: 12/21/2011 Document Reviewed: 03/02/2011 [employer] Patient Information 2014 [employer].  

## 2014-02-14 NOTE — Progress Notes (Signed)
After removing 355 grams of blood patient complains of dizziness. Phlebotomy stopped, normal saline wide open, patient placed in supine position. Unable to place in trendelenburg as patient stated she has a "bad" hip and and knee. Dr. Boyce Medici desk nurse, Shirlean Mylar notified.  0900 Patient states dizziness is better but not completely resolved.  0910 Patient states dizziness is completely resolved.  0950 Patient states her dizziness is still completley resolved. Her vital signs are stable. Patient discharged using a cane.

## 2014-02-14 NOTE — Progress Notes (Signed)
Met patient while she was in the infusion room. She is experiencing loneliness since much of her family is dead and her spouse had a stroke in January.  Explored the emotional and spiritual resources she has for support and helped her identify the relationships that could provide her with support. Cultivated a relationship of care by helping her tell her story of aging. Presence; prayer.  Epifania Gore, [facility], PhD Chaplain

## 2014-02-22 ENCOUNTER — Telehealth: Payer: Self-pay

## 2014-02-22 ENCOUNTER — Ambulatory Visit: Payer: Medicare Other | Admitting: Nurse Practitioner

## 2014-02-22 ENCOUNTER — Other Ambulatory Visit: Payer: Self-pay | Admitting: Nurse Practitioner

## 2014-02-22 DIAGNOSIS — M1990 Unspecified osteoarthritis, unspecified site: Secondary | ICD-10-CM

## 2014-02-22 MED ORDER — TRAMADOL HCL 50 MG PO TABS: ORAL_TABLET | ORAL | Status: DC

## 2014-02-22 NOTE — Telephone Encounter (Signed)
Patient's daughter ( who is a Marine scientist) called indicating despite medications rx'ed at last ov, patient is still with back, left hip/knee pain. Patient is basically bed written due to the pain. Pain at described areas has decreased mobility.  [name] is thinking some additional studies ie: MRI may need to be done to get to the root of this pain, patient is deconditioning as a result of this pain. Patient is almost out of pain medication prescribed on 02/08/2014  Please advise

## 2014-02-22 NOTE — Telephone Encounter (Signed)
Spoke with patient's daughter. Patient seen Dr.Landu about 1 year ago for hip surgery. Patient's daughter needs new referral (for Dr.Landu or first available) due to other areas of pain.  Patient's daughter would like to know if patient can be switched back to Tramadol because she is unable to come [facility] to pick-up rx for Oxycodone. The patient is unable to tell a difference in the Tramadol and Oxycodone as far as pain management goes. RX for Tramadol to be called into [facility] on file if approved   Janett Billow please place referral and advise on rx change.

## 2014-02-22 NOTE — Telephone Encounter (Signed)
Should follow up with ortho to see if they can offer any additional recommendations

## 2014-02-22 NOTE — Telephone Encounter (Signed)
done

## 2014-02-22 NOTE — Telephone Encounter (Signed)
Denise Lane is aware referral placed and rx for tramadol will be faxed

## 2014-03-22 DIAGNOSIS — M1610 Unilateral primary osteoarthritis, unspecified hip: Secondary | ICD-10-CM | POA: Diagnosis not present

## 2014-03-23 DIAGNOSIS — M25559 Pain in unspecified hip: Secondary | ICD-10-CM | POA: Diagnosis not present

## 2014-03-27 ENCOUNTER — Other Ambulatory Visit: Payer: Self-pay | Admitting: Nurse Practitioner

## 2014-03-28 DIAGNOSIS — M25579 Pain in unspecified ankle and joints of unspecified foot: Secondary | ICD-10-CM | POA: Diagnosis not present

## 2014-03-28 DIAGNOSIS — S63509A Unspecified sprain of unspecified wrist, initial encounter: Secondary | ICD-10-CM | POA: Diagnosis not present

## 2014-03-28 DIAGNOSIS — Z96649 Presence of unspecified artificial hip joint: Secondary | ICD-10-CM | POA: Diagnosis not present

## 2014-03-28 DIAGNOSIS — S8990XA Unspecified injury of unspecified lower leg, initial encounter: Secondary | ICD-10-CM | POA: Diagnosis not present

## 2014-03-28 DIAGNOSIS — S022XXA Fracture of nasal bones, initial encounter for closed fracture: Secondary | ICD-10-CM | POA: Diagnosis not present

## 2014-03-28 DIAGNOSIS — Z885 Allergy status to narcotic agent status: Secondary | ICD-10-CM | POA: Diagnosis not present

## 2014-03-28 DIAGNOSIS — W010XXA Fall on same level from slipping, tripping and stumbling without subsequent striking against object, initial encounter: Secondary | ICD-10-CM | POA: Diagnosis not present

## 2014-03-28 DIAGNOSIS — S93409A Sprain of unspecified ligament of unspecified ankle, initial encounter: Secondary | ICD-10-CM | POA: Diagnosis not present

## 2014-03-28 DIAGNOSIS — S99929A Unspecified injury of unspecified foot, initial encounter: Secondary | ICD-10-CM | POA: Diagnosis not present

## 2014-03-28 DIAGNOSIS — IMO0002 Reserved for concepts with insufficient information to code with codable children: Secondary | ICD-10-CM | POA: Diagnosis not present

## 2014-03-28 DIAGNOSIS — S0993XA Unspecified injury of face, initial encounter: Secondary | ICD-10-CM | POA: Diagnosis not present

## 2014-03-28 DIAGNOSIS — S79919A Unspecified injury of unspecified hip, initial encounter: Secondary | ICD-10-CM | POA: Diagnosis not present

## 2014-03-28 DIAGNOSIS — S79929A Unspecified injury of unspecified thigh, initial encounter: Secondary | ICD-10-CM | POA: Diagnosis not present

## 2014-03-28 DIAGNOSIS — I10 Essential (primary) hypertension: Secondary | ICD-10-CM | POA: Diagnosis not present

## 2014-03-28 DIAGNOSIS — M79609 Pain in unspecified limb: Secondary | ICD-10-CM | POA: Diagnosis not present

## 2014-03-29 ENCOUNTER — Ambulatory Visit (INDEPENDENT_AMBULATORY_CARE_PROVIDER_SITE_OTHER): Payer: Medicare Other | Admitting: Nurse Practitioner

## 2014-03-29 ENCOUNTER — Encounter: Payer: Self-pay | Admitting: Nurse Practitioner

## 2014-03-29 VITALS — BP 138/60 | HR 78 | Temp 98.6°F | Resp 20 | Ht 67.2 in | Wt 109.2 lb

## 2014-03-29 DIAGNOSIS — F341 Dysthymic disorder: Secondary | ICD-10-CM

## 2014-03-29 DIAGNOSIS — M545 Low back pain, unspecified: Secondary | ICD-10-CM

## 2014-03-29 DIAGNOSIS — R32 Unspecified urinary incontinence: Secondary | ICD-10-CM | POA: Diagnosis not present

## 2014-03-29 DIAGNOSIS — I10 Essential (primary) hypertension: Secondary | ICD-10-CM

## 2014-03-29 DIAGNOSIS — M1610 Unilateral primary osteoarthritis, unspecified hip: Secondary | ICD-10-CM

## 2014-03-29 DIAGNOSIS — M1612 Unilateral primary osteoarthritis, left hip: Secondary | ICD-10-CM | POA: Insufficient documentation

## 2014-03-29 DIAGNOSIS — R634 Abnormal weight loss: Secondary | ICD-10-CM

## 2014-03-29 DIAGNOSIS — F32A Depression, unspecified: Secondary | ICD-10-CM

## 2014-03-29 DIAGNOSIS — F419 Anxiety disorder, unspecified: Secondary | ICD-10-CM

## 2014-03-29 DIAGNOSIS — K5900 Constipation, unspecified: Secondary | ICD-10-CM

## 2014-03-29 DIAGNOSIS — W19XXXS Unspecified fall, sequela: Secondary | ICD-10-CM

## 2014-03-29 DIAGNOSIS — F329 Major depressive disorder, single episode, unspecified: Secondary | ICD-10-CM

## 2014-03-29 DIAGNOSIS — T7589XS Other specified effects of external causes, sequela: Secondary | ICD-10-CM

## 2014-03-29 MED ORDER — OXYCODONE HCL 5 MG PO CAPS: 5.0000 mg | ORAL_CAPSULE | ORAL | Status: DC | PRN

## 2014-03-29 NOTE — Progress Notes (Signed)
Patient ID: Denise Lane, female   DOB: [birth year], 80 y.o.   MRN: [id number]    Allergies  Allergen Reactions  . Codeine Nausea And Vomiting  . Dilaudid [Hydromorphone Hcl] Hives and Other (See Comments)    "whelps" (10/18/2012)  . Macrodantin Hives and Other (See Comments)    "drug fever; chills; felt terrible" (10/18/2012)  . Morphine And Related Hives and Nausea And Vomiting  . Sulfa Antibiotics Hives, Rash and Other (See Comments)    "got real sick" (10/18/2012)    Chief Complaint  Patient presents with  . Follow-up    HPI: Patient is a 80 y.o. female seen in the office today for routine follow   Yesterday feel at [facility] parking lot, stepped off curb and twisted ankle, hand broke fall and then she hit her head, went to ED in [facility], extensive wrist xrays done to r/o fracture in wrist and CT scans which showed broken nose and several bones in her face broken. Taking tramadol to help with pain  Pt reports she is in a lot of pain most the time with hip, Having total hip replacement on left in July-- ortho reports square peg in a round hole, hip consistently popping- Had injection that has helped a lot  Can not tolerate to stand up for long, not able to cook and clean  Daughter is bringing food, freezer food, family helps  Looking into smaller apartment near daughter to help   effexor helping depression, no side effects noted   Started myrbetriq which has helped a lot with side effect of dry mouth, bladder symptoms are the same.   Review of Systems:  Review of Systems  Constitutional: Positive for weight loss (on-going weight loss) and malaise/fatigue. Negative for fever and chills.  HENT: Negative for congestion, ear discharge, ear pain, nosebleeds and sore throat.   Eyes: Negative for blurred vision.  Respiratory: Negative for cough, shortness of breath and wheezing.   Cardiovascular: Negative for chest pain, palpitations and leg swelling.  Gastrointestinal: Negative  for heartburn (none since she has been on prilosec), nausea, vomiting, diarrhea, constipation, blood in stool and melena.  Genitourinary: Positive for frequency (with urinary incontience). Negative for dysuria, urgency and hematuria.       Has not noticed much effects from myrbetriq  Musculoskeletal: Positive for back pain and joint pain (left hip and back). Negative for falls and myalgias.  Neurological: Positive for weakness. Negative for dizziness and headaches.  Psychiatric/Behavioral: Positive for depression. Negative for memory loss. The patient is nervous/anxious. The patient does not have insomnia.        Anxiety and depressive symptoms have improved since on Effexor      Past Medical History  Diagnosis Date  . Hemochromatosis 1987  . Stenosis of esophagus   . Dysphagia   . Muscle pain   . History of colonic polyps     NOTED 11-20-09 IN COLONOSCOPY REPORT  . Diverticulosis of sigmoid colon 11-20-2009  . Hemorrhoids, internal 11-20-09  . Hyperlipidemia     no meds required  . Tremor, essential 10-29-09  . Hemochromatosis   . PONV (postoperative nausea and vomiting)   . Hypertension     takes Metoprolol daily  . History of bronchitis 1991-1996    "chronic; related to winter" (10/18/2012)  . History of blood transfusion     "S/P colonoscopy after polyp removed; got 2 units; esophageal bleed got 1 unit; really messed up hemochromatosis" (1/7/20214)  . Occasional tremors   .  Joint pain   . Joint swelling   . Bruises easily   . GERD (gastroesophageal reflux disease)     takes OMeprazole daily  . H/O hiatal hernia   . Gastric ulcer   . Vomiting     occasionally  . Urinary urgency     takes Ditropan daily  . Osteoporosis   . Dry eyes     uses Refresh eye drops  . Rheumatic fever     hx of  . Osteoarthritis   . Osteoarthritis of right hip 10/18/2012  . Chronic lower back pain   . Depression     but doesn't require meds  . Kidney stones 1963  . Basal cell cancer     "face,  legs" (10/18/2012)  . Squamous carcinoma     "LLE" (10/18/2012)   Past Surgical History  Procedure Laterality Date  . Tonsillectomy  1953  . Appendectomy  1952  . Cholecystectomy  1977  . Tubal ligation  1975  . Lumbar disc surgery  1989  . Shoulder arthroscopy w/ rotator cuff repair  1998?    "left" (10/18/2012)  . Dilation and curettage of uterus  1975  . Cataract extraction w/ intraocular lens  implant, bilateral  2001    "both eyes" (10/18/2012)  . Esophagogastroduodenoscopy    . Colonoscopy    . Total hip arthroplasty  10/18/2012    "right" (10/18/2012)  . Skin cancer excision      "multiple" (10/18/2012)  . Total hip arthroplasty  10/18/2012    Procedure: TOTAL HIP ARTHROPLASTY;  Surgeon: Johnny Bridge, MD;  Location: [place];  Service: Orthopedics;  Laterality: Right;   Social History:   reports that she has never smoked. She has never used smokeless tobacco. She reports that she does not drink alcohol or use illicit drugs.  Family History  Problem Relation Age of Onset  . Hyperlipidemia Daughter     Medications: Patient's Medications  New Prescriptions   No medications on file  Previous Medications   ACETAMINOPHEN (TYLENOL) 650 MG CR TABLET    Take 650 mg by mouth 2 (two) times daily.    CALCIUM CITRATE (CALCITRATE - DOSED IN MG ELEMENTAL CALCIUM) 950 MG TABLET    Take 1 tablet by mouth daily.   CARBOXYMETHYLCELLULOSE (REFRESH PLUS) 0.5 % SOLN    Place 1 drop into both eyes 3 (three) times daily as needed. For dry eyes   CHOLECALCIFEROL (VITAMIN D) 1000 UNITS TABLET    Take 1,000 Units by mouth 2 (two) times daily.   GABAPENTIN (NEURONTIN) 300 MG CAPSULE    TAKE ONE CAPSULE BY MOUTH EVERY 8 HOURS AS NEEDED FOR NERVE PAIN   LOSARTAN (COZAAR) 50 MG TABLET    TAKE ONE TABLET BY MOUTH ONCE DAILY FOR BLOOD PRESSURE   METOPROLOL TARTRATE (LOPRESSOR) 25 MG TABLET    Take 1 tablet (25 mg total) by mouth daily.   METOPROLOL TARTRATE (LOPRESSOR) 25 MG TABLET    TAKE ONE TABLET BY MOUTH  ONCE DAILY   MIRABEGRON ER (MYRBETRIQ) 25 MG TB24 TABLET    Take 1 tablet (25 mg total) by mouth daily.   OMEPRAZOLE (PRILOSEC) 20 MG CAPSULE    Take 20 mg by mouth daily.   TRAMADOL (ULTRAM) 50 MG TABLET    1-2 tablets every 6 hours as needed for pain/max is 6 tablets in 24 hours   VENLAFAXINE XR (EFFEXOR-XR) 37.5 MG 24 HR CAPSULE    Take 1 capsule (37.5 mg total) by mouth daily  with breakfast.  Modified Medications   No medications on file  Discontinued Medications   OXYCODONE (OXY IR/ROXICODONE) 5 MG IMMEDIATE RELEASE TABLET    Take 1 tablet (5 mg total) by mouth every 6 (six) hours as needed for moderate pain or severe pain. Every night as needed for severe pain     Physical Exam:  Filed Vitals:   03/29/14 1316  BP: 138/60  Pulse: 78  Temp: 98.6 F (37 C)  TempSrc: Oral  Resp: 20  Height: 5' 7.2" (1.707 m)  Weight: 109 lb 3.2 oz (49.533 kg)  SpO2: 97%    Physical Exam  Constitutional: She is oriented to person, place, and time and well-developed, well-nourished, and in no distress.  HENT:  Mouth/Throat: Oropharynx is clear and moist. No oropharyngeal exudate.  Eyes: Conjunctivae and EOM are normal. Pupils are equal, round, and reactive to light.  Neck: Normal range of motion. Neck supple.  Cardiovascular: Normal rate, regular rhythm and normal heart sounds.   Pulmonary/Chest: Effort normal and breath sounds normal. No respiratory distress.  Abdominal: Soft. Bowel sounds are normal. She exhibits no distension.  Musculoskeletal: She exhibits tenderness (to wrist; full ROM, brusing noted ). She exhibits no edema.  Neurological: She is alert and oriented to person, place, and time.  Skin: Skin is warm and dry.  Psychiatric: Affect normal.    Labs reviewed: Basic Metabolic Panel:  Recent Labs  05/23/13 0938 10/23/13 1221 02/08/14 1128 02/12/14 0956  NA 143 144 137 141  K 4.3 4.5 4.2 4.5  CL 102  --  95*  --   CO2 30* 28 28 31*  GLUCOSE 103* 104 97 109  BUN 20  21.3 32* 18.6  CREATININE 0.81 0.8 0.71 0.7  CALCIUM 10.1 9.9 10.0 10.3  TSH  --   --  1.800  --    Liver Function Tests:  Recent Labs  10/23/13 1221 02/08/14 1128 02/12/14 0956  AST 17 17 16   ALT 10 9 8   ALKPHOS 75 83 84  BILITOT 0.60 0.5 0.63  PROT 6.6 6.3 6.4  ALBUMIN 3.7  --  3.6   No results found for this basename: LIPASE, AMYLASE,  in the last 8760 hours No results found for this basename: AMMONIA,  in the last 8760 hours CBC:  Recent Labs  10/23/13 1221 02/08/14 1128 02/12/14 0956  WBC 7.7 8.0 8.0  NEUTROABS 4.1 4.8 4.9  HGB 12.7 12.1 12.5  HCT 37.6 34.9 36.7  MCV 96.3 92 96.3  PLT 249 299 281   Lipid Panel:  Recent Labs  05/23/13 0938  HDL 40  LDLCALC 115*  TRIG 187*  CHOLHDL 4.8*   TSH:  Recent Labs  02/08/14 1128  TSH 1.800   A1C: No results found for this basename: HGBA1C     Assessment/Plan  1. Primary osteoarthritis of left hip -scheduled for total hip replacement, pain has improved with injection -daughter needs FMLA paperwork done to help care for mother after surgery due to pts husbanding having dementia  2. Bilateral low back pain without sciatica -without change, conts PRNs  3. Anxiety and depression -improved on Effexor   4. Unspecified urinary incontinence To increase myrbetriq to 50 mg for 4 weeks to see if this help with incontinence,  If no difference may decrease back to 25 mg daily Samples provided  5. Essential hypertension conts on cozaar   6. Unspecified constipation -long time issues, on multiple medications at home without good effects, samples provided for amitiza 24  mcg twice daily with food by mouth  7. Fall -in parking lot, extensive work up done at outside facility -pain tolerable at this time however does report it has been getting worse -no fx noted on xray of hand, wrist or hip, fracture noted on nasal/face bones - will give Rx for oxy IR 5 mg, pt has tolerated in the past for severe  pain Oxycodone 5 mg every 4 hours as needed for pain   8. Weight loss -noted encouraged to drink at least 1 ensure daily, increase portions, eating 3 whole meals, nutritional snack

## 2014-03-29 NOTE — Patient Instructions (Signed)
To increase myrbetriq to 50 mg for 4 weeks to see if this help with bladder If no difference may decrease back to 25 mg daily  For constipation use amitiza 24 mcg twice daily with food  Oxycodone 5 mg every 4 hours as needed for pain

## 2014-04-06 ENCOUNTER — Encounter (HOSPITAL_COMMUNITY): Payer: Self-pay | Admitting: Emergency Medicine

## 2014-04-06 ENCOUNTER — Inpatient Hospital Stay (HOSPITAL_COMMUNITY)
Admission: EM | Admit: 2014-04-06 | Discharge: 2014-04-09 | DRG: 536 | Disposition: A | Discharge status: Home Health | Payer: Medicare Other | Attending: Internal Medicine | Admitting: Internal Medicine

## 2014-04-06 DIAGNOSIS — I10 Essential (primary) hypertension: Secondary | ICD-10-CM | POA: Diagnosis present

## 2014-04-06 DIAGNOSIS — M79651 Pain in right thigh: Secondary | ICD-10-CM

## 2014-04-06 DIAGNOSIS — F411 Generalized anxiety disorder: Secondary | ICD-10-CM | POA: Diagnosis present

## 2014-04-06 DIAGNOSIS — T84049A Periprosthetic fracture around unspecified internal prosthetic joint, initial encounter: Secondary | ICD-10-CM | POA: Diagnosis not present

## 2014-04-06 DIAGNOSIS — S72109A Unspecified trochanteric fracture of unspecified femur, initial encounter for closed fracture: Principal | ICD-10-CM | POA: Diagnosis present

## 2014-04-06 DIAGNOSIS — S79919A Unspecified injury of unspecified hip, initial encounter: Secondary | ICD-10-CM | POA: Diagnosis not present

## 2014-04-06 DIAGNOSIS — S8990XA Unspecified injury of unspecified lower leg, initial encounter: Secondary | ICD-10-CM | POA: Diagnosis not present

## 2014-04-06 DIAGNOSIS — M791 Myalgia, unspecified site: Secondary | ICD-10-CM

## 2014-04-06 DIAGNOSIS — W19XXXA Unspecified fall, initial encounter: Secondary | ICD-10-CM | POA: Diagnosis present

## 2014-04-06 DIAGNOSIS — Z85828 Personal history of other malignant neoplasm of skin: Secondary | ICD-10-CM | POA: Diagnosis not present

## 2014-04-06 DIAGNOSIS — S72009A Fracture of unspecified part of neck of unspecified femur, initial encounter for closed fracture: Secondary | ICD-10-CM

## 2014-04-06 DIAGNOSIS — M25559 Pain in unspecified hip: Secondary | ICD-10-CM | POA: Diagnosis not present

## 2014-04-06 DIAGNOSIS — S0990XA Unspecified injury of head, initial encounter: Secondary | ICD-10-CM | POA: Diagnosis not present

## 2014-04-06 DIAGNOSIS — Z79899 Other long term (current) drug therapy: Secondary | ICD-10-CM | POA: Diagnosis not present

## 2014-04-06 DIAGNOSIS — F419 Anxiety disorder, unspecified: Secondary | ICD-10-CM

## 2014-04-06 DIAGNOSIS — Z96649 Presence of unspecified artificial hip joint: Secondary | ICD-10-CM | POA: Diagnosis not present

## 2014-04-06 DIAGNOSIS — M79609 Pain in unspecified limb: Secondary | ICD-10-CM | POA: Diagnosis not present

## 2014-04-06 DIAGNOSIS — M978XXA Periprosthetic fracture around other internal prosthetic joint, initial encounter: Secondary | ICD-10-CM

## 2014-04-06 DIAGNOSIS — M1990 Unspecified osteoarthritis, unspecified site: Secondary | ICD-10-CM | POA: Diagnosis present

## 2014-04-06 DIAGNOSIS — S298XXA Other specified injuries of thorax, initial encounter: Secondary | ICD-10-CM | POA: Diagnosis not present

## 2014-04-06 DIAGNOSIS — E785 Hyperlipidemia, unspecified: Secondary | ICD-10-CM

## 2014-04-06 DIAGNOSIS — M25579 Pain in unspecified ankle and joints of unspecified foot: Secondary | ICD-10-CM | POA: Diagnosis not present

## 2014-04-06 DIAGNOSIS — F32A Depression, unspecified: Secondary | ICD-10-CM

## 2014-04-06 DIAGNOSIS — G629 Polyneuropathy, unspecified: Secondary | ICD-10-CM

## 2014-04-06 DIAGNOSIS — M810 Age-related osteoporosis without current pathological fracture: Secondary | ICD-10-CM | POA: Diagnosis present

## 2014-04-06 DIAGNOSIS — S99919A Unspecified injury of unspecified ankle, initial encounter: Secondary | ICD-10-CM | POA: Diagnosis not present

## 2014-04-06 DIAGNOSIS — R3915 Urgency of urination: Secondary | ICD-10-CM | POA: Diagnosis present

## 2014-04-06 DIAGNOSIS — M25551 Pain in right hip: Secondary | ICD-10-CM

## 2014-04-06 DIAGNOSIS — F329 Major depressive disorder, single episode, unspecified: Secondary | ICD-10-CM | POA: Diagnosis present

## 2014-04-06 DIAGNOSIS — R32 Unspecified urinary incontinence: Secondary | ICD-10-CM

## 2014-04-06 DIAGNOSIS — S199XXA Unspecified injury of neck, initial encounter: Secondary | ICD-10-CM | POA: Diagnosis not present

## 2014-04-06 DIAGNOSIS — F341 Dysthymic disorder: Secondary | ICD-10-CM | POA: Diagnosis not present

## 2014-04-06 DIAGNOSIS — K219 Gastro-esophageal reflux disease without esophagitis: Secondary | ICD-10-CM | POA: Diagnosis present

## 2014-04-06 DIAGNOSIS — H04129 Dry eye syndrome of unspecified lacrimal gland: Secondary | ICD-10-CM | POA: Diagnosis present

## 2014-04-06 DIAGNOSIS — F3289 Other specified depressive episodes: Secondary | ICD-10-CM | POA: Diagnosis present

## 2014-04-06 DIAGNOSIS — S0993XA Unspecified injury of face, initial encounter: Secondary | ICD-10-CM | POA: Diagnosis not present

## 2014-04-06 DIAGNOSIS — T148XXA Other injury of unspecified body region, initial encounter: Secondary | ICD-10-CM | POA: Diagnosis not present

## 2014-04-06 NOTE — ED Notes (Signed)
Daughter Jeannene Patella- [phone]

## 2014-04-06 NOTE — ED Notes (Signed)
Pt also reporting right sided elbow pain.

## 2014-04-06 NOTE — ED Notes (Signed)
Per EMS- pt experienced witnessed fall. Pt c/o right hip pain and headache. Pt has history of multiple falls recently with a sprain to left ankle and fracture of nose and facial bones. Pt reports hitting her head with the fall today. Hematoma noted to right head. No obvious deformity to hip. Pulses intact. No loss of feeling. Pr reports that her ankle gave out and that caused her to lose her balance. Denies LOC. Denies dizziness, n/v, lightheadedness. NAD noted. 120mcg Fentanyl given in ambulance.

## 2014-04-06 NOTE — ED Notes (Signed)
Pt inquiring about delay. Dr. Eulis Foster aware. Pt and family updated.

## 2014-04-07 ENCOUNTER — Observation Stay (HOSPITAL_COMMUNITY): Payer: Medicare Other

## 2014-04-07 ENCOUNTER — Emergency Department (HOSPITAL_COMMUNITY): Payer: Medicare Other

## 2014-04-07 ENCOUNTER — Encounter (HOSPITAL_COMMUNITY): Payer: Self-pay

## 2014-04-07 DIAGNOSIS — F341 Dysthymic disorder: Secondary | ICD-10-CM | POA: Diagnosis not present

## 2014-04-07 DIAGNOSIS — S0990XA Unspecified injury of head, initial encounter: Secondary | ICD-10-CM | POA: Diagnosis not present

## 2014-04-07 DIAGNOSIS — M25579 Pain in unspecified ankle and joints of unspecified foot: Secondary | ICD-10-CM | POA: Diagnosis not present

## 2014-04-07 DIAGNOSIS — Z96649 Presence of unspecified artificial hip joint: Secondary | ICD-10-CM | POA: Diagnosis not present

## 2014-04-07 DIAGNOSIS — M79609 Pain in unspecified limb: Secondary | ICD-10-CM | POA: Diagnosis not present

## 2014-04-07 DIAGNOSIS — T84049A Periprosthetic fracture around unspecified internal prosthetic joint, initial encounter: Secondary | ICD-10-CM

## 2014-04-07 DIAGNOSIS — S79919A Unspecified injury of unspecified hip, initial encounter: Secondary | ICD-10-CM | POA: Diagnosis not present

## 2014-04-07 DIAGNOSIS — S0993XA Unspecified injury of face, initial encounter: Secondary | ICD-10-CM | POA: Diagnosis not present

## 2014-04-07 DIAGNOSIS — Z9660 Presence of unspecified orthopedic joint implant: Secondary | ICD-10-CM

## 2014-04-07 DIAGNOSIS — S298XXA Other specified injuries of thorax, initial encounter: Secondary | ICD-10-CM | POA: Diagnosis not present

## 2014-04-07 DIAGNOSIS — W19XXXA Unspecified fall, initial encounter: Secondary | ICD-10-CM

## 2014-04-07 DIAGNOSIS — M978XXA Periprosthetic fracture around other internal prosthetic joint, initial encounter: Secondary | ICD-10-CM

## 2014-04-07 DIAGNOSIS — S72009A Fracture of unspecified part of neck of unspecified femur, initial encounter for closed fracture: Secondary | ICD-10-CM

## 2014-04-07 DIAGNOSIS — M25559 Pain in unspecified hip: Secondary | ICD-10-CM | POA: Diagnosis not present

## 2014-04-07 DIAGNOSIS — S8990XA Unspecified injury of unspecified lower leg, initial encounter: Secondary | ICD-10-CM | POA: Diagnosis not present

## 2014-04-07 DIAGNOSIS — M79651 Pain in right thigh: Secondary | ICD-10-CM | POA: Diagnosis present

## 2014-04-07 DIAGNOSIS — S72109A Unspecified trochanteric fracture of unspecified femur, initial encounter for closed fracture: Secondary | ICD-10-CM | POA: Diagnosis not present

## 2014-04-07 LAB — URINALYSIS, ROUTINE W REFLEX MICROSCOPIC
Bilirubin Urine: NEGATIVE
Glucose, UA: NEGATIVE mg/dL
HGB URINE DIPSTICK: NEGATIVE
Ketones, ur: NEGATIVE mg/dL
Leukocytes, UA: NEGATIVE
NITRITE: NEGATIVE
PROTEIN: NEGATIVE mg/dL
Specific Gravity, Urine: 1.018 (ref 1.005–1.030)
UROBILINOGEN UA: 0.2 mg/dL (ref 0.0–1.0)
pH: 7.5 (ref 5.0–8.0)

## 2014-04-07 LAB — TYPE AND SCREEN
ABO/RH(D): O POS
ANTIBODY SCREEN: NEGATIVE

## 2014-04-07 LAB — CBC WITH DIFFERENTIAL/PLATELET
Basophils Absolute: 0 10*3/uL (ref 0.0–0.1)
Basophils Relative: 0 % (ref 0–1)
EOS ABS: 0 10*3/uL (ref 0.0–0.7)
EOS PCT: 0 % (ref 0–5)
HCT: 36.1 % (ref 36.0–46.0)
Hemoglobin: 11.8 g/dL — ABNORMAL LOW (ref 12.0–15.0)
LYMPHS ABS: 1.9 10*3/uL (ref 0.7–4.0)
Lymphocytes Relative: 15 % (ref 12–46)
MCH: 31.9 pg (ref 26.0–34.0)
MCHC: 32.7 g/dL (ref 30.0–36.0)
MCV: 97.6 fL (ref 78.0–100.0)
Monocytes Absolute: 0.8 10*3/uL (ref 0.1–1.0)
Monocytes Relative: 6 % (ref 3–12)
Neutro Abs: 10.2 10*3/uL — ABNORMAL HIGH (ref 1.7–7.7)
Neutrophils Relative %: 79 % — ABNORMAL HIGH (ref 43–77)
PLATELETS: 260 10*3/uL (ref 150–400)
RBC: 3.7 MIL/uL — AB (ref 3.87–5.11)
RDW: 12.1 % (ref 11.5–15.5)
WBC: 12.9 10*3/uL — ABNORMAL HIGH (ref 4.0–10.5)

## 2014-04-07 LAB — HEPATIC FUNCTION PANEL
ALK PHOS: 120 U/L — AB (ref 39–117)
ALT: 13 U/L (ref 0–35)
AST: 14 U/L (ref 0–37)
Albumin: 3 g/dL — ABNORMAL LOW (ref 3.5–5.2)
BILIRUBIN TOTAL: 0.6 mg/dL (ref 0.3–1.2)
Total Protein: 6.3 g/dL (ref 6.0–8.3)

## 2014-04-07 LAB — BASIC METABOLIC PANEL
BUN: 20 mg/dL (ref 6–23)
CALCIUM: 9.5 mg/dL (ref 8.4–10.5)
CO2: 30 mEq/L (ref 19–32)
Chloride: 98 mEq/L (ref 96–112)
Creatinine, Ser: 0.54 mg/dL (ref 0.50–1.10)
GFR calc Af Amer: >90 mL/min (ref >90)
GFR calc non Af Amer: 86 mL/min — ABNORMAL LOW (ref >90)
Glucose, Bld: 121 mg/dL — ABNORMAL HIGH (ref 70–99)
Potassium: 3.9 mEq/L (ref 3.7–5.3)
SODIUM: 139 meq/L (ref 137–147)

## 2014-04-07 LAB — URINE MICROSCOPIC-ADD ON

## 2014-04-07 LAB — PROTIME-INR
INR: 1.09 (ref 0.00–1.49)
Prothrombin Time: 14.1 seconds (ref 11.6–15.2)

## 2014-04-07 MED ORDER — FENTANYL CITRATE 0.05 MG/ML IJ SOLN
50.0000 ug | INTRAMUSCULAR | Status: DC | PRN
  Administered 2014-04-07 (×2): 50 ug via INTRAVENOUS
  Filled 2014-04-07 (×2): qty 2

## 2014-04-07 MED ORDER — PANTOPRAZOLE SODIUM 40 MG PO TBEC
40.0000 mg | DELAYED_RELEASE_TABLET | Freq: Every day | ORAL | Status: DC
  Administered 2014-04-07 – 2014-04-09 (×3): 40 mg via ORAL
  Filled 2014-04-07 (×3): qty 1

## 2014-04-07 MED ORDER — VITAMIN D3 25 MCG (1000 UNIT) PO TABS
1000.0000 [IU] | ORAL_TABLET | Freq: Two times a day (BID) | ORAL | Status: DC
  Administered 2014-04-07 – 2014-04-09 (×5): 1000 [IU] via ORAL
  Filled 2014-04-07 (×6): qty 1

## 2014-04-07 MED ORDER — GABAPENTIN 300 MG PO CAPS
300.0000 mg | ORAL_CAPSULE | Freq: Three times a day (TID) | ORAL | Status: DC
  Administered 2014-04-07 – 2014-04-09 (×7): 300 mg via ORAL
  Filled 2014-04-07 (×8): qty 1

## 2014-04-07 MED ORDER — LOSARTAN POTASSIUM 50 MG PO TABS
50.0000 mg | ORAL_TABLET | Freq: Every day | ORAL | Status: DC
  Administered 2014-04-07 – 2014-04-09 (×3): 50 mg via ORAL
  Filled 2014-04-07 (×3): qty 1

## 2014-04-07 MED ORDER — FENTANYL CITRATE 0.05 MG/ML IJ SOLN
50.0000 ug | INTRAMUSCULAR | Status: DC | PRN
  Administered 2014-04-08: 50 ug via INTRAVENOUS
  Filled 2014-04-07: qty 2

## 2014-04-07 MED ORDER — ENSURE PUDDING PO PUDG
1.0000 | Freq: Two times a day (BID) | ORAL | Status: DC
  Administered 2014-04-07 – 2014-04-08 (×3): 1 via ORAL

## 2014-04-07 MED ORDER — CALCIUM CITRATE 950 (200 CA) MG PO TABS
1.0000 | ORAL_TABLET | Freq: Every day | ORAL | Status: DC
  Administered 2014-04-07 – 2014-04-09 (×3): 200 mg via ORAL
  Filled 2014-04-07 (×3): qty 1

## 2014-04-07 MED ORDER — SENNA 8.6 MG PO TABS
1.0000 | ORAL_TABLET | Freq: Two times a day (BID) | ORAL | Status: DC
  Administered 2014-04-07 – 2014-04-09 (×5): 8.6 mg via ORAL
  Filled 2014-04-07 (×6): qty 1

## 2014-04-07 MED ORDER — POLYVINYL ALCOHOL 1.4 % OP SOLN
1.0000 [drp] | Freq: Three times a day (TID) | OPHTHALMIC | Status: DC | PRN
  Filled 2014-04-07: qty 15

## 2014-04-07 MED ORDER — VENLAFAXINE HCL ER 150 MG PO CP24
150.0000 mg | ORAL_CAPSULE | Freq: Every day | ORAL | Status: DC
  Administered 2014-04-07 – 2014-04-08 (×2): 150 mg via ORAL
  Filled 2014-04-07 (×5): qty 1

## 2014-04-07 MED ORDER — CARBOXYMETHYLCELLULOSE SODIUM 0.5 % OP SOLN: 1.0000 [drp] | Freq: Three times a day (TID) | OPHTHALMIC | Status: DC | PRN

## 2014-04-07 MED ORDER — METHOCARBAMOL 500 MG PO TABS
500.0000 mg | ORAL_TABLET | Freq: Four times a day (QID) | ORAL | Status: DC | PRN
  Administered 2014-04-07 – 2014-04-09 (×4): 500 mg via ORAL
  Filled 2014-04-07 (×4): qty 1

## 2014-04-07 MED ORDER — METHOCARBAMOL 1000 MG/10ML IJ SOLN
500.0000 mg | Freq: Four times a day (QID) | INTRAVENOUS | Status: DC | PRN
  Filled 2014-04-07: qty 5

## 2014-04-07 MED ORDER — TRAMADOL HCL 50 MG PO TABS
50.0000 mg | ORAL_TABLET | Freq: Four times a day (QID) | ORAL | Status: DC | PRN
  Administered 2014-04-07 – 2014-04-09 (×6): 100 mg via ORAL
  Filled 2014-04-07 (×6): qty 2

## 2014-04-07 MED ORDER — MIRABEGRON ER 25 MG PO TB24
25.0000 mg | ORAL_TABLET | Freq: Every day | ORAL | Status: DC
  Administered 2014-04-07 – 2014-04-09 (×3): 25 mg via ORAL
  Filled 2014-04-07 (×3): qty 1

## 2014-04-07 MED ORDER — FENTANYL CITRATE 0.05 MG/ML IJ SOLN: 50.0000 ug | INTRAMUSCULAR | Status: DC | PRN

## 2014-04-07 MED ORDER — ONDANSETRON HCL 4 MG/2ML IJ SOLN: 4.0000 mg | Freq: Three times a day (TID) | INTRAMUSCULAR | Status: DC | PRN

## 2014-04-07 MED ORDER — METOPROLOL TARTRATE 25 MG PO TABS
25.0000 mg | ORAL_TABLET | Freq: Every day | ORAL | Status: DC
  Administered 2014-04-07 – 2014-04-09 (×3): 25 mg via ORAL
  Filled 2014-04-07 (×3): qty 1

## 2014-04-07 MED ORDER — GABAPENTIN 300 MG PO CAPS
300.0000 mg | ORAL_CAPSULE | Freq: Three times a day (TID) | ORAL | Status: DC | PRN
  Filled 2014-04-07: qty 1

## 2014-04-07 MED ORDER — FENTANYL CITRATE 0.05 MG/ML IJ SOLN
50.0000 ug | INTRAMUSCULAR | Status: DC | PRN
Start: 2014-04-07 — End: 2014-04-07
  Administered 2014-04-07: 50 ug via INTRAVENOUS
  Filled 2014-04-07: qty 2

## 2014-04-07 NOTE — Progress Notes (Signed)
Orthopedic Tech Progress Note Patient Details:  Denise Lane [facility] [birth year] [id number]  Patient ID: Denise Lane, female   DOB: [birth year], 80 y.o.   MRN: [id number]   Denise Lane 04/07/2014, 1:42 PMTrapeze bar

## 2014-04-07 NOTE — Care Management Utilization Note (Signed)
UR Completed.    Julie Wise Amerson, RN, BSN Phone #[phone] 

## 2014-04-07 NOTE — Progress Notes (Signed)
INITIAL NUTRITION ASSESSMENT  DOCUMENTATION CODES Per approved criteria  -Underweight   INTERVENTION: -Recommend Vanilla Ensure Complete BID -Encouraged intake of high protein/kcal foods, increasing snack frequency -Will continue to monitor  NUTRITION DIAGNOSIS: Increased nutrient needs (protein/kcal) related to increased demand for nutrients as evidenced by BMI <18.5.   Goal: Pt to meet >/= 90% of their estimated nutrition needs    Monitor:  Total protein/energy intake, labs, weights  Reason for Assessment: MST/Underweight BMI  80 y.o. female  Admitting Dx: <principal problem not specified>  ASSESSMENT: Denise Lane is a 80 y.o. female with history of osteoarthritis and status post right total hip replacement in January of 2014, osteoporosis, hypertension, hemochromatosis who presents with above complaints. She reports that she sustained a fall last month and strained her left ankle and she believes it 'just gave way' yesterday and caused her to fall.  -Pt reports a very healthy appetite, consumes 3 balanced, "country style" meals (pinto beans, corn bread, mashed potatoes) and vanilla Ensure BID -Endorses a gradual weight loss of 30 lbs over past 5 year, with a 10 lbs weight loss occuring in past 6 months (8.3% body weight loss, non-severe for time frame) -Family has been encouraging her to consume more frequent snacks and supplements. Daughter provides her with vanilla Ensure.  -Discussed ways to increase kcal/protein with additional snacks, increasing intake of Ensure, and use of high kcal foods (peanut butter, whole fat dairy) -Is eating well during admit, cannot tolerate cheese or mayo, RD to add comment in [facility]  Height: Ht Readings from Last 1 Encounters:  04/06/14 5\' 6"  (1.676 m)    Weight: Wt Readings from Last 1 Encounters:  04/06/14 110 lb (49.896 kg)    Ideal Body Weight: 130 lbs  % Ideal Body Weight: 85%  Wt Readings from Last 10 Encounters:   04/06/14 110 lb (49.896 kg)  03/29/14 109 lb 3.2 oz (49.533 kg)  02/12/14 115 lb 11.2 oz (52.481 kg)  02/08/14 115 lb 6.4 oz (52.345 kg)  10/23/13 126 lb (57.153 kg)  09/27/13 132 lb 3.2 oz (59.966 kg)  06/23/13 125 lb 1.6 oz (56.745 kg)  05/25/13 124 lb 6.4 oz (56.427 kg)  01/12/13 122 lb (55.339 kg)  12/23/12 123 lb (55.792 kg)    Usual Body Weight: 120-125lbs  % Usual Body Weight: 92%  BMI:  Body mass index is 17.76 kg/(m^2). Underweight  Estimated Nutritional Needs: Kcal: 1500-1700 Protein: 65-75 gram Fluid: >/=1500 ml/daily  Skin: WDL  Diet Order: General  EDUCATION NEEDS: -Education needs addressed  No intake or output data in the 24 hours ending 04/07/14 1408  Last BM: 6/25   Labs:   Recent Labs Lab 04/06/14 2354  NA 139  K 3.9  CL 98  CO2 30  BUN 20  CREATININE 0.54  CALCIUM 9.5  GLUCOSE 121*    CBG (last 3)  No results found for this basename: GLUCAP,  in the last 72 hours  Scheduled Meds: . calcium citrate  1 tablet Oral Daily  . cholecalciferol  1,000 Units Oral BID  . feeding supplement (ENSURE)  1 Container Oral BID BM  . gabapentin  300 mg Oral TID  . losartan  50 mg Oral Daily  . metoprolol tartrate  25 mg Oral Daily  . mirabegron ER  25 mg Oral Daily  . pantoprazole  40 mg Oral Daily  . senna  1 tablet Oral BID  . venlafaxine XR  150 mg Oral Q breakfast    Continuous Infusions:  Past Medical History  Diagnosis Date  . Hemochromatosis 1987  . Stenosis of esophagus   . Dysphagia   . Muscle pain   . History of colonic polyps     NOTED 11-20-09 IN COLONOSCOPY REPORT  . Diverticulosis of sigmoid colon 11-20-2009  . Hemorrhoids, internal 11-20-09  . Hyperlipidemia     no meds required  . Tremor, essential 10-29-09  . Hemochromatosis   . PONV (postoperative nausea and vomiting)   . Hypertension     takes Metoprolol daily  . History of bronchitis 1991-1996    "chronic; related to winter" (10/18/2012)  . History of blood  transfusion     "S/P colonoscopy after polyp removed; got 2 units; esophageal bleed got 1 unit; really messed up hemochromatosis" (1/7/20214)  . Occasional tremors   . Joint pain   . Joint swelling   . Bruises easily   . GERD (gastroesophageal reflux disease)     takes OMeprazole daily  . H/O hiatal hernia   . Gastric ulcer   . Vomiting     occasionally  . Urinary urgency     takes Ditropan daily  . Osteoporosis   . Dry eyes     uses Refresh eye drops  . Rheumatic fever     hx of  . Osteoarthritis   . Osteoarthritis of right hip 10/18/2012  . Chronic lower back pain   . Depression     but doesn't require meds  . Kidney stones 1963  . Basal cell cancer     "face, legs" (10/18/2012)  . Squamous carcinoma     "LLE" (10/18/2012)    Past Surgical History  Procedure Laterality Date  . Tonsillectomy  1953  . Appendectomy  1952  . Cholecystectomy  1977  . Tubal ligation  1975  . Lumbar disc surgery  1989  . Shoulder arthroscopy w/ rotator cuff repair  1998?    "left" (10/18/2012)  . Dilation and curettage of uterus  1975  . Cataract extraction w/ intraocular lens  implant, bilateral  2001    "both eyes" (10/18/2012)  . Esophagogastroduodenoscopy    . Colonoscopy    . Total hip arthroplasty  10/18/2012    "right" (10/18/2012)  . Skin cancer excision      "multiple" (10/18/2012)  . Total hip arthroplasty  10/18/2012    Procedure: TOTAL HIP ARTHROPLASTY;  Surgeon: Johnny Bridge, MD;  Location: [place];  Service: Orthopedics;  Laterality: Right;    Atlee Abide MS RD [place] Clinical Dietitian [phone]

## 2014-04-07 NOTE — Care Management Note (Signed)
CARE MANAGEMENT NOTE 04/07/2014  Patient:  Denise Lane, Denise Lane   Account Number:  [id number]  Date Initiated:  04/07/2014  Documentation initiated by:  Ricki Miller  Subjective/Objective Assessment:   80 yr old female s/p right proximal femoral fx after a fall. Patient states she cares for her husband, who has had a stroke. Has a daughter who helps out.Has walker, rollator and 3in1.     Action/Plan:   PT/OT eval  Case manager will continue to follow.  Has used [facility] in the past, will use them now if needed.   Anticipated DC Date:  04/09/2014   Anticipated DC Plan:  [place]  CM consult      [facility] Choice  HOME HEALTH   Choice offered to / List presented to:  C-1 Patient        [place] arranged  [place].   Status of service:  In process, will continue to follow

## 2014-04-07 NOTE — ED Provider Notes (Signed)
CSN: [id number]     Arrival date & time 04/06/14  2043 History   First MD Initiated Contact with Patient 04/06/14 2302     Chief Complaint  Patient presents with  . Fall     (Consider location/radiation/quality/duration/timing/severity/associated sxs/prior Treatment) HPI Comments: 80 year old female with history of hemochromatosis, osteoarthritis, right hip replacement in January, anxiety, neuropathy presents after fall prior to arrival at her home. Patient is walking with her walker and then to get through another door she required use her cane and she felt she twisted her left ankle causing her to fall on her right side hitting her right hip and right upper thigh. Patient also hit the right side of her head without loss of consciousness. Patient denies any current neuro symptoms in main concern is her pain with range of motion of the right hip.   Patient is a 80 y.o. female presenting with fall. The history is provided by the patient.  Fall This is a new problem. Pertinent negatives include no chest pain, no abdominal pain, no headaches and no shortness of breath.    Past Medical History  Diagnosis Date  . Hemochromatosis 1987  . Stenosis of esophagus   . Dysphagia   . Muscle pain   . History of colonic polyps     NOTED 11-20-09 IN COLONOSCOPY REPORT  . Diverticulosis of sigmoid colon 11-20-2009  . Hemorrhoids, internal 11-20-09  . Hyperlipidemia     no meds required  . Tremor, essential 10-29-09  . Hemochromatosis   . PONV (postoperative nausea and vomiting)   . Hypertension     takes Metoprolol daily  . History of bronchitis 1991-1996    "chronic; related to winter" (10/18/2012)  . History of blood transfusion     "S/P colonoscopy after polyp removed; got 2 units; esophageal bleed got 1 unit; really messed up hemochromatosis" (1/7/20214)  . Occasional tremors   . Joint pain   . Joint swelling   . Bruises easily   . GERD (gastroesophageal reflux disease)     takes OMeprazole  daily  . H/O hiatal hernia   . Gastric ulcer   . Vomiting     occasionally  . Urinary urgency     takes Ditropan daily  . Osteoporosis   . Dry eyes     uses Refresh eye drops  . Rheumatic fever     hx of  . Osteoarthritis   . Osteoarthritis of right hip 10/18/2012  . Chronic lower back pain   . Depression     but doesn't require meds  . Kidney stones 1963  . Basal cell cancer     "face, legs" (10/18/2012)  . Squamous carcinoma     "LLE" (10/18/2012)   Past Surgical History  Procedure Laterality Date  . Tonsillectomy  1953  . Appendectomy  1952  . Cholecystectomy  1977  . Tubal ligation  1975  . Lumbar disc surgery  1989  . Shoulder arthroscopy w/ rotator cuff repair  1998?    "left" (10/18/2012)  . Dilation and curettage of uterus  1975  . Cataract extraction w/ intraocular lens  implant, bilateral  2001    "both eyes" (10/18/2012)  . Esophagogastroduodenoscopy    . Colonoscopy    . Total hip arthroplasty  10/18/2012    "right" (10/18/2012)  . Skin cancer excision      "multiple" (10/18/2012)  . Total hip arthroplasty  10/18/2012    Procedure: TOTAL HIP ARTHROPLASTY;  Surgeon: Johnny Bridge, MD;  Location: [facility];  Service: Orthopedics;  Laterality: Right;   Family History  Problem Relation Age of Onset  . Hyperlipidemia Daughter    History  Substance Use Topics  . Smoking status: Never Smoker   . Smokeless tobacco: Never Used  . Alcohol Use: No   OB History   Grav Para Term Preterm Abortions TAB SAB Ect Mult Living                 Review of Systems  Constitutional: Negative for fever and chills.  HENT: Negative for congestion.   Eyes: Negative for visual disturbance.  Respiratory: Negative for shortness of breath.   Cardiovascular: Negative for chest pain.  Gastrointestinal: Negative for vomiting and abdominal pain.  Genitourinary: Negative for dysuria and flank pain.  Musculoskeletal: Positive for arthralgias and neck pain. Negative for back pain and neck  stiffness.  Skin: Negative for rash.  Neurological: Negative for syncope, light-headedness and headaches.      Allergies  Codeine; Dilaudid; Macrodantin; Morphine and related; and Sulfa antibiotics  Home Medications   Prior to Admission medications   Medication Sig Start Date End Date Taking? Authorizing Provider  acetaminophen (TYLENOL) 650 MG CR tablet Take 650 mg by mouth 2 (two) times daily.    Yes Historical Provider, MD  calcium citrate (CALCITRATE - DOSED IN MG ELEMENTAL CALCIUM) 950 MG tablet Take 1 tablet by mouth daily.   Yes Historical Provider, MD  carboxymethylcellulose (REFRESH PLUS) 0.5 % SOLN Place 1 drop into both eyes 3 (three) times daily as needed. For dry eyes   Yes Historical Provider, MD  cholecalciferol (VITAMIN D) 1000 UNITS tablet Take 1,000 Units by mouth 2 (two) times daily. 10/17/12  Yes Historical Provider, MD  gabapentin (NEURONTIN) 300 MG capsule TAKE ONE CAPSULE BY MOUTH EVERY 8 HOURS AS NEEDED FOR NERVE PAIN 02/08/14  Yes Pricilla Larsson, NP  losartan (COZAAR) 50 MG tablet TAKE ONE TABLET BY MOUTH ONCE DAILY FOR BLOOD PRESSURE 10/06/13  Yes Pricilla Larsson, NP  metoprolol tartrate (LOPRESSOR) 25 MG tablet Take 1 tablet (25 mg total) by mouth daily. 05/25/13  Yes Pricilla Larsson, NP  mirabegron ER (MYRBETRIQ) 25 MG TB24 tablet Take 1 tablet (25 mg total) by mouth daily. 02/08/14  Yes Pricilla Larsson, NP  omeprazole (PRILOSEC) 20 MG capsule Take 20 mg by mouth daily. 11/15/09  Yes Winfield Cunas., MD  traMADol (ULTRAM) 50 MG tablet 1-2 tablets every 6 hours as needed for pain/max is 6 tablets in 24 hours 02/22/14  Yes Pricilla Larsson, NP  venlafaxine XR (EFFEXOR-XR) 150 MG 24 hr capsule Take 150 mg by mouth daily with breakfast.   Yes Historical Provider, MD  venlafaxine XR (EFFEXOR-XR) 37.5 MG 24 hr capsule Take 1 capsule (37.5 mg total) by mouth daily with breakfast. 02/08/14  Yes Pricilla Larsson, NP   BP 110/53  Pulse 62  Resp 12  Ht 5\' 6"  (1.676 m)  Wt  110 lb (49.896 kg)  BMI 17.76 kg/m2  SpO2 96% Physical Exam  Nursing note and vitals reviewed. Constitutional: She is oriented to person, place, and time. She appears well-developed and well-nourished.  HENT:  Head: Normocephalic and atraumatic.  Eyes: Conjunctivae are normal. Right eye exhibits no discharge. Left eye exhibits no discharge.  Neck: Normal range of motion. Neck supple. No tracheal deviation present.  Cardiovascular: Normal rate and regular rhythm.   Pulmonary/Chest: Effort normal and breath sounds normal.  Abdominal: Soft. She exhibits no distension. There  is no tenderness. There is no guarding.  Musculoskeletal: She exhibits tenderness. She exhibits no edema.  Patient has no midline tenderness cervical, thoracic, lumbar. Full range of motion of neck with mild lower discomfort. Patient has tenderness right anterior lateral hip and proximal femur. No tenderness to knees bilateral or edema. Patient has mild lateral malleoli tenderness on the left ankle and no tenderness in the right ankle.  Neurological: She is alert and oriented to person, place, and time. No cranial nerve deficit.  Skin: Skin is warm. No rash noted.  Psychiatric: She has a normal mood and affect.    ED Course  Procedures (including critical care time) Labs Review Labs Reviewed  CBC WITH DIFFERENTIAL - Abnormal; Notable for the following:    WBC 12.9 (*)    RBC 3.70 (*)    Hemoglobin 11.8 (*)    Neutrophils Relative % 79 (*)    Neutro Abs 10.2 (*)    All other components within normal limits  BASIC METABOLIC PANEL - Abnormal; Notable for the following:    Glucose, Bld 121 (*)    GFR calc non Af Amer 86 (*)    All other components within normal limits    Imaging Review Dg Chest 1 View  04/07/2014   CLINICAL DATA:  Fall with right hip pain.  EXAM: CHEST - 1 VIEW  COMPARISON:  10/14/2012  FINDINGS: Normal heart size and upper mediastinal contours. There is cephalized blood flow related to supine  state. No edema, effusion or visible pneumothorax. No evidence of fracture.  IMPRESSION: No active disease.   Electronically Signed   By: Jorje Guild M.D.   On: 04/07/2014 00:55   Dg Hip Complete Right  04/07/2014   CLINICAL DATA:  Right hip pain after fall.  EXAM: RIGHT HIP - COMPLETE 2+ VIEW  COMPARISON:  None.  FINDINGS: Status post right total hip arthroplasty. No acute fracture or dislocation is noted. Severe degenerative joint disease of the left hip joint is noted with associated deformity of left femoral head concerning for avascular necrosis.  IMPRESSION: Status post right total hip arthroplasty. No acute fracture dislocation is noted. Severe degenerative joint disease of left hip is noted.   Electronically Signed   By: Sabino Dick M.D.   On: 04/07/2014 00:52   Dg Femur Right  04/07/2014   CLINICAL DATA:  FALL  EXAM: RIGHT FEMUR - 2 VIEW  COMPARISON:  None.  FINDINGS: A right hip arthroplasties in place which is in gross anatomic alignment. No periprosthetic lucency to suggest loosening or infection. No acute fracture or dislocation.  Soft tissues within normal limits.  Degenerative spurring noted at the superior pole of patella. No joint effusion seen within the suprapatellar recess.  IMPRESSION: 1. No acute fracture or dislocation. 2. Right hip arthroplasty in place without evidence of complication.   Electronically Signed   By: Jeannine Boga M.D.   On: 04/07/2014 00:55   Dg Ankle Complete Left  04/07/2014   CLINICAL DATA:  Left ankle pain.  EXAM: LEFT ANKLE COMPLETE - 3+ VIEW  COMPARISON:  March 28, 2014.  FINDINGS: Talar dome appears intact. No significant soft tissue swelling is noted. Degenerative changes seen involving the posterior talus and navicular bone. No acute fracture or dislocation is noted.  IMPRESSION: No acute abnormality seen in left ankle.   Electronically Signed   By: Sabino Dick M.D.   On: 04/07/2014 00:56   Ct Head Wo Contrast  04/07/2014   CLINICAL DATA:  Fall with head injury.  EXAM: CT HEAD WITHOUT CONTRAST  CT CERVICAL SPINE WITHOUT CONTRAST  TECHNIQUE: Multidetector CT imaging of the head and cervical spine was performed following the standard protocol without intravenous contrast. Multiplanar CT image reconstructions of the cervical spine were also generated.  COMPARISON:  None.  FINDINGS: CT HEAD FINDINGS  Skull and Sinuses:Negative for calvarial fracture. There is mild scalp contusion in the right parietal region. There is a stable fracture lucency through the left nasal bone, first noted 10 days prior.  Orbits: No evidence of injury.  Bilateral cataract resection.  Brain: No evidence of acute abnormality, such as acute infarction, hemorrhage, hydrocephalus, or mass lesion/mass effect. There is age related generalized cerebral volume loss. Patchy bilateral deep and subcortical cerebral white matter low density consistent with chronic small vessel disease.  CT CERVICAL SPINE FINDINGS  No acute fracture identified. There is mild (approximately 2 mm) anterolisthesis at C5-6, C6-7, and C7-T1. These are associated with advanced facet osteoarthritis, the most likely cause. There is no subluxation of the facet joints. The C5-6 and C6-7 anterolisthesis was present on recent facial CT.  No gross cervical canal hematoma or prevertebral edema. Cervical degenerative changes are most notable in the facet joints, with ankylosis present at C2-3 and C3-4. There is ossified ligamentous buckling and hypertrophy posteriorly at C4-5 and C5-6, resulting in posterior CSF effacement. The bones are osteopenic. There is subpleural dependent lung opacity which is bilaterally symmetric, favoring scarring.  IMPRESSION: 1. No evidence of acute intracranial injury. Negative for cervical spine fracture. 2. Mid and lower cervical anterolisthesis associated with advanced facet osteoarthritis. 3. Senescent changes include brain atrophy and chronic small vessel disease.   Electronically Signed    By: Jorje Guild M.D.   On: 04/07/2014 02:33   Ct Cervical Spine Wo Contrast  04/07/2014   CLINICAL DATA:  Fall with head injury.  EXAM: CT HEAD WITHOUT CONTRAST  CT CERVICAL SPINE WITHOUT CONTRAST  TECHNIQUE: Multidetector CT imaging of the head and cervical spine was performed following the standard protocol without intravenous contrast. Multiplanar CT image reconstructions of the cervical spine were also generated.  COMPARISON:  None.  FINDINGS: CT HEAD FINDINGS  Skull and Sinuses:Negative for calvarial fracture. There is mild scalp contusion in the right parietal region. There is a stable fracture lucency through the left nasal bone, first noted 10 days prior.  Orbits: No evidence of injury.  Bilateral cataract resection.  Brain: No evidence of acute abnormality, such as acute infarction, hemorrhage, hydrocephalus, or mass lesion/mass effect. There is age related generalized cerebral volume loss. Patchy bilateral deep and subcortical cerebral white matter low density consistent with chronic small vessel disease.  CT CERVICAL SPINE FINDINGS  No acute fracture identified. There is mild (approximately 2 mm) anterolisthesis at C5-6, C6-7, and C7-T1. These are associated with advanced facet osteoarthritis, the most likely cause. There is no subluxation of the facet joints. The C5-6 and C6-7 anterolisthesis was present on recent facial CT.  No gross cervical canal hematoma or prevertebral edema. Cervical degenerative changes are most notable in the facet joints, with ankylosis present at C2-3 and C3-4. There is ossified ligamentous buckling and hypertrophy posteriorly at C4-5 and C5-6, resulting in posterior CSF effacement. The bones are osteopenic. There is subpleural dependent lung opacity which is bilaterally symmetric, favoring scarring.  IMPRESSION: 1. No evidence of acute intracranial injury. Negative for cervical spine fracture. 2. Mid and lower cervical anterolisthesis associated with advanced facet  osteoarthritis. 3. Senescent changes include brain  atrophy and chronic small vessel disease.   Electronically Signed   By: Jorje Guild M.D.   On: 04/07/2014 02:33     EKG Interpretation None      MDM   Final diagnoses:  Fall, initial encounter  Right hip pain  Right thigh pain   Aside from right hip pain patient is well-appearing and remembers all events. Mechanical fall. Plan for blood work, CT head and neck, x-rays.  CT head and neck unremarkable reviewed. X-rays reviewed by radiology and myself in no acute fracture dislocation. Pain medicines given later on recheck patient still has significant pain, repeat pain meds however patient unable to bear weight.  Discuss case with [facility] hospitalist for observation initial plan tried was evaluated and cells however Dr Posey Pronto called back later and agreed with observation.  The patients results and plan were reviewed and discussed.   Any x-rays performed were personally reviewed by myself.   Differential diagnosis were considered with the presenting HPI.  Medications  fentaNYL (SUBLIMAZE) injection 50 mcg (50 mcg Intravenous Given 04/07/14 0419)  fentaNYL (SUBLIMAZE) injection 50 mcg (not administered)  ondansetron (ZOFRAN) injection 4 mg (not administered)  fentaNYL (SUBLIMAZE) injection 50 mcg (not administered)      Filed Vitals:   04/07/14 0335 04/07/14 0400 04/07/14 0500 04/07/14 0530  BP: 148/55 133/57 130/58 136/64  Pulse:  72 71 71  Resp: 21 22 17 17   Height:      Weight:      SpO2: 97% 94% 94% 94%    Admission/ observation were discussed with the admitting physician, patient and/or family and they are comfortable with the plan.      Mariea Clonts, MD 04/07/14 [phone]

## 2014-04-07 NOTE — ED Notes (Signed)
Pt. Unable to put weight on right leg.  Pt. Cannot ambulate at this time.

## 2014-04-07 NOTE — H&P (Signed)
[facility] History and Physical  Denise Lane TKW:[id number] DOB: 05/22/1930 DOA: 04/06/2014  Referring physician: EDP PCP: Delrae Rend, NP   Chief Complaint:  Right hip pain status post fall  HPI: Denise Lane is a 80 y.o. female with history of osteoarthritis and status post right total hip replacement in January of 2014, osteoporosis, hypertension, hemochromatosis who presents with above complaints. She reports that she sustained a fall last month and strained her left ankle and she believes it 'just gave way' yesterday and caused her to fall. She denies chest pain, dizziness, fevers dysuria, cough, shortness of breath and no focal weakness. She was seen in the ED and plain x-rays were negative, CT was done and revealed a right periprosthetic proximal femoral fracture. Chest x-ray was negative for acute findings. She is admitted for further evaluation and management.    Review of Systems The patient denies anorexia, fever, dysuria, weight loss,, vision loss, decreased hearing, hoarseness, chest pain, syncope, dyspnea on exertion, peripheral edema, hemoptysis, abdominal pain, melena, hematochezia, severe indigestion/heartburn, hematuria, incontinence, muscle weakness, suspicious skin lesions, transient blindness, depression, unusual weight change, abnormal bleeding, enlarged lymph nodes, angioedema, and breast masses.   Past Medical History  Diagnosis Date  . Hemochromatosis 1987  . Stenosis of esophagus   . Dysphagia   . Muscle pain   . History of colonic polyps     NOTED 11-20-09 IN COLONOSCOPY REPORT  . Diverticulosis of sigmoid colon 11-20-2009  . Hemorrhoids, internal 11-20-09  . Hyperlipidemia     no meds required  . Tremor, essential 10-29-09  . Hemochromatosis   . PONV (postoperative nausea and vomiting)   . Hypertension     takes Metoprolol daily  . History of bronchitis 1991-1996    "chronic; related to winter" (10/18/2012)  . History of blood  transfusion     "S/P colonoscopy after polyp removed; got 2 units; esophageal bleed got 1 unit; really messed up hemochromatosis" (1/7/20214)  . Occasional tremors   . Joint pain   . Joint swelling   . Bruises easily   . GERD (gastroesophageal reflux disease)     takes OMeprazole daily  . H/O hiatal hernia   . Gastric ulcer   . Vomiting     occasionally  . Urinary urgency     takes Ditropan daily  . Osteoporosis   . Dry eyes     uses Refresh eye drops  . Rheumatic fever     hx of  . Osteoarthritis   . Osteoarthritis of right hip 10/18/2012  . Chronic lower back pain   . Depression     but doesn't require meds  . Kidney stones 1963  . Basal cell cancer     "face, legs" (10/18/2012)  . Squamous carcinoma     "LLE" (10/18/2012)   Past Surgical History  Procedure Laterality Date  . Tonsillectomy  1953  . Appendectomy  1952  . Cholecystectomy  1977  . Tubal ligation  1975  . Lumbar disc surgery  1989  . Shoulder arthroscopy w/ rotator cuff repair  1998?    "left" (10/18/2012)  . Dilation and curettage of uterus  1975  . Cataract extraction w/ intraocular lens  implant, bilateral  2001    "both eyes" (10/18/2012)  . Esophagogastroduodenoscopy    . Colonoscopy    . Total hip arthroplasty  10/18/2012    "right" (10/18/2012)  . Skin cancer excision      "multiple" (10/18/2012)  . Total hip arthroplasty  10/18/2012    Procedure: TOTAL HIP ARTHROPLASTY;  Surgeon: Johnny Bridge, MD;  Location: [place];  Service: Orthopedics;  Laterality: Right;   Social History:  reports that she has never smoked. She has never used smokeless tobacco. She reports that she does not drink alcohol or use illicit drugs.  Allergies  Allergen Reactions  . Codeine Nausea And Vomiting  . Dilaudid [Hydromorphone Hcl] Hives and Other (See Comments)    "whelps" (10/18/2012)  . Macrodantin Hives and Other (See Comments)    "drug fever; chills; felt terrible" (10/18/2012)  . Morphine And Related Hives and Nausea And  Vomiting  . Sulfa Antibiotics Hives, Rash and Other (See Comments)    "got real sick" (10/18/2012)    Family History  Problem Relation Age of Onset  . Hyperlipidemia Daughter      Prior to Admission medications   Medication Sig Start Date End Date Taking? Authorizing Provider  acetaminophen (TYLENOL) 650 MG CR tablet Take 650 mg by mouth 2 (two) times daily.    Yes Historical Provider, MD  calcium citrate (CALCITRATE - DOSED IN MG ELEMENTAL CALCIUM) 950 MG tablet Take 1 tablet by mouth daily.   Yes Historical Provider, MD  carboxymethylcellulose (REFRESH PLUS) 0.5 % SOLN Place 1 drop into both eyes 3 (three) times daily as needed. For dry eyes   Yes Historical Provider, MD  cholecalciferol (VITAMIN D) 1000 UNITS tablet Take 1,000 Units by mouth 2 (two) times daily. 10/17/12  Yes Historical Provider, MD  gabapentin (NEURONTIN) 300 MG capsule TAKE ONE CAPSULE BY MOUTH EVERY 8 HOURS AS NEEDED FOR NERVE PAIN 02/08/14  Yes Pricilla Larsson, NP  losartan (COZAAR) 50 MG tablet TAKE ONE TABLET BY MOUTH ONCE DAILY FOR BLOOD PRESSURE 10/06/13  Yes Pricilla Larsson, NP  metoprolol tartrate (LOPRESSOR) 25 MG tablet Take 1 tablet (25 mg total) by mouth daily. 05/25/13  Yes Pricilla Larsson, NP  mirabegron ER (MYRBETRIQ) 25 MG TB24 tablet Take 1 tablet (25 mg total) by mouth daily. 02/08/14  Yes Pricilla Larsson, NP  omeprazole (PRILOSEC) 20 MG capsule Take 20 mg by mouth daily. 11/15/09  Yes Winfield Cunas., MD  traMADol (ULTRAM) 50 MG tablet 1-2 tablets every 6 hours as needed for pain/max is 6 tablets in 24 hours 02/22/14  Yes Pricilla Larsson, NP  venlafaxine XR (EFFEXOR-XR) 150 MG 24 hr capsule Take 150 mg by mouth daily with breakfast.   Yes Historical Provider, MD  venlafaxine XR (EFFEXOR-XR) 37.5 MG 24 hr capsule Take 1 capsule (37.5 mg total) by mouth daily with breakfast. 02/08/14  Yes Pricilla Larsson, NP   Physical Exam: Filed Vitals:   04/07/14 0655  BP: 157/72  Pulse: 71  Temp: 97.9 F (36.6 C)   Resp: 14    BP 157/72  Pulse 71  Temp(Src) 97.9 F (36.6 C) (Oral)  Resp 14  Ht 5\' 6"  (1.676 m)  Wt 49.896 kg (110 lb)  BMI 17.76 kg/m2  SpO2 99% Constitutional: Vital signs reviewed.  Patient is a well-developed, thin-appearing elderly female  in no acute distress and cooperative with exam. Alert and oriented x3.  Head: Normocephalic and atraumatic Mouth: no erythema or exudates, MMM Eyes: PERRL, EOMI, conjunctivae normal, No scleral icterus.  Neck: Supple, Trachea midline normal ROM, No JVD, mass, thyromegaly, or carotid bruit present.  Cardiovascular: RRR, S1 normal, S2 normal, no MRG, pulses symmetric and intact bilaterally Pulmonary/Chest: normal respiratory effort, CTAB, no wheezes, rales, or rhonchi Abdominal: Soft. Non-tender,  non-distended, bowel sounds are normal, no masses, organomegaly, or guarding present.  GU: no CVA tenderness  Extremities: Right hip ROM limited by pain  Neurological: A&O x3, Strength is normal and symmetric bilaterally, cranial nerve II-XII are grossly intact, no focal motor deficit, sensory intact to light touch bilaterally.  Skin: Warm, dry and intact. No rash, cyanosis, or clubbing.  Psychiatric: Normal mood and affect. speech and behavior is normal. Judgment and thought content normal. Cognition and memory are normal.                Labs on Admission:  Basic Metabolic Panel:  Recent Labs Lab 04/06/14 2354  NA 139  K 3.9  CL 98  CO2 30  GLUCOSE 121*  BUN 20  CREATININE 0.54  CALCIUM 9.5   Liver Function Tests: No results found for this basename: AST, ALT, ALKPHOS, BILITOT, PROT, ALBUMIN,  in the last 168 hours No results found for this basename: LIPASE, AMYLASE,  in the last 168 hours No results found for this basename: AMMONIA,  in the last 168 hours CBC:  Recent Labs Lab 04/06/14 2354  WBC 12.9*  NEUTROABS 10.2*  HGB 11.8*  HCT 36.1  MCV 97.6  PLT 260   Cardiac Enzymes: No results found for this basename:  CKTOTAL, CKMB, CKMBINDEX, TROPONINI,  in the last 168 hours  BNP (last 3 results) No results found for this basename: PROBNP,  in the last 8760 hours CBG: No results found for this basename: GLUCAP,  in the last 168 hours  Radiological Exams on Admission: Dg Chest 1 View  04/07/2014   CLINICAL DATA:  Fall with right hip pain.  EXAM: CHEST - 1 VIEW  COMPARISON:  10/14/2012  FINDINGS: Normal heart size and upper mediastinal contours. There is cephalized blood flow related to supine state. No edema, effusion or visible pneumothorax. No evidence of fracture.  IMPRESSION: No active disease.   Electronically Signed   By: Jorje Guild M.D.   On: 04/07/2014 00:55   Dg Hip Complete Right  04/07/2014   CLINICAL DATA:  Right hip pain after fall.  EXAM: RIGHT HIP - COMPLETE 2+ VIEW  COMPARISON:  None.  FINDINGS: Status post right total hip arthroplasty. No acute fracture or dislocation is noted. Severe degenerative joint disease of the left hip joint is noted with associated deformity of left femoral head concerning for avascular necrosis.  IMPRESSION: Status post right total hip arthroplasty. No acute fracture dislocation is noted. Severe degenerative joint disease of left hip is noted.   Electronically Signed   By: Sabino Dick M.D.   On: 04/07/2014 00:52   Dg Femur Right  04/07/2014   CLINICAL DATA:  FALL  EXAM: RIGHT FEMUR - 2 VIEW  COMPARISON:  None.  FINDINGS: A right hip arthroplasties in place which is in gross anatomic alignment. No periprosthetic lucency to suggest loosening or infection. No acute fracture or dislocation.  Soft tissues within normal limits.  Degenerative spurring noted at the superior pole of patella. No joint effusion seen within the suprapatellar recess.  IMPRESSION: 1. No acute fracture or dislocation. 2. Right hip arthroplasty in place without evidence of complication.   Electronically Signed   By: Jeannine Boga M.D.   On: 04/07/2014 00:55   Dg Ankle Complete  Left  04/07/2014   CLINICAL DATA:  Left ankle pain.  EXAM: LEFT ANKLE COMPLETE - 3+ VIEW  COMPARISON:  March 28, 2014.  FINDINGS: Talar dome appears intact. No significant soft tissue swelling is  noted. Degenerative changes seen involving the posterior talus and navicular bone. No acute fracture or dislocation is noted.  IMPRESSION: No acute abnormality seen in left ankle.   Electronically Signed   By: Sabino Dick M.D.   On: 04/07/2014 00:56   Ct Head Wo Contrast  04/07/2014   CLINICAL DATA:  Fall with head injury.  EXAM: CT HEAD WITHOUT CONTRAST  CT CERVICAL SPINE WITHOUT CONTRAST  TECHNIQUE: Multidetector CT imaging of the head and cervical spine was performed following the standard protocol without intravenous contrast. Multiplanar CT image reconstructions of the cervical spine were also generated.  COMPARISON:  None.  FINDINGS: CT HEAD FINDINGS  Skull and Sinuses:Negative for calvarial fracture. There is mild scalp contusion in the right parietal region. There is a stable fracture lucency through the left nasal bone, first noted 10 days prior.  Orbits: No evidence of injury.  Bilateral cataract resection.  Brain: No evidence of acute abnormality, such as acute infarction, hemorrhage, hydrocephalus, or mass lesion/mass effect. There is age related generalized cerebral volume loss. Patchy bilateral deep and subcortical cerebral white matter low density consistent with chronic small vessel disease.  CT CERVICAL SPINE FINDINGS  No acute fracture identified. There is mild (approximately 2 mm) anterolisthesis at C5-6, C6-7, and C7-T1. These are associated with advanced facet osteoarthritis, the most likely cause. There is no subluxation of the facet joints. The C5-6 and C6-7 anterolisthesis was present on recent facial CT.  No gross cervical canal hematoma or prevertebral edema. Cervical degenerative changes are most notable in the facet joints, with ankylosis present at C2-3 and C3-4. There is ossified  ligamentous buckling and hypertrophy posteriorly at C4-5 and C5-6, resulting in posterior CSF effacement. The bones are osteopenic. There is subpleural dependent lung opacity which is bilaterally symmetric, favoring scarring.  IMPRESSION: 1. No evidence of acute intracranial injury. Negative for cervical spine fracture. 2. Mid and lower cervical anterolisthesis associated with advanced facet osteoarthritis. 3. Senescent changes include brain atrophy and chronic small vessel disease.   Electronically Signed   By: Jorje Guild M.D.   On: 04/07/2014 02:33   Ct Cervical Spine Wo Contrast  04/07/2014   CLINICAL DATA:  Fall with head injury.  EXAM: CT HEAD WITHOUT CONTRAST  CT CERVICAL SPINE WITHOUT CONTRAST  TECHNIQUE: Multidetector CT imaging of the head and cervical spine was performed following the standard protocol without intravenous contrast. Multiplanar CT image reconstructions of the cervical spine were also generated.  COMPARISON:  None.  FINDINGS: CT HEAD FINDINGS  Skull and Sinuses:Negative for calvarial fracture. There is mild scalp contusion in the right parietal region. There is a stable fracture lucency through the left nasal bone, first noted 10 days prior.  Orbits: No evidence of injury.  Bilateral cataract resection.  Brain: No evidence of acute abnormality, such as acute infarction, hemorrhage, hydrocephalus, or mass lesion/mass effect. There is age related generalized cerebral volume loss. Patchy bilateral deep and subcortical cerebral white matter low density consistent with chronic small vessel disease.  CT CERVICAL SPINE FINDINGS  No acute fracture identified. There is mild (approximately 2 mm) anterolisthesis at C5-6, C6-7, and C7-T1. These are associated with advanced facet osteoarthritis, the most likely cause. There is no subluxation of the facet joints. The C5-6 and C6-7 anterolisthesis was present on recent facial CT.  No gross cervical canal hematoma or prevertebral edema. Cervical  degenerative changes are most notable in the facet joints, with ankylosis present at C2-3 and C3-4. There is ossified ligamentous buckling and hypertrophy  posteriorly at C4-5 and C5-6, resulting in posterior CSF effacement. The bones are osteopenic. There is subpleural dependent lung opacity which is bilaterally symmetric, favoring scarring.  IMPRESSION: 1. No evidence of acute intracranial injury. Negative for cervical spine fracture. 2. Mid and lower cervical anterolisthesis associated with advanced facet osteoarthritis. 3. Senescent changes include brain atrophy and chronic small vessel disease.   Electronically Signed   By: Jorje Guild M.D.   On: 04/07/2014 02:33   Ct Hip Right Wo Contrast  04/07/2014   CLINICAL DATA:  Fall with right hip pain  EXAM: CT OF THE RIGHT HIP WITHOUT CONTRAST  TECHNIQUE: Multidetector CT imaging was performed according to the standard protocol. Multiplanar CT image reconstructions were also generated.  COMPARISON:  Right hip radiography from the same day  FINDINGS: Total right hip arthroplasty. There is an acute, comminuted periprosthetic fracture involving the greater trochanter, extending along the anterior and posterior aspect of the intertrochanteric femur, ending approximately 5 cm below the horizontal intertrochanteric portion of the implant. The fracture is essentially nondisplaced but there is an anterior fragment which is mildly distracted, but nondisplaced. There is mild muscular compartment edema around the fracture, without measurable hematoma. The acetabular cup is well seated and the prosthesis is located. The imaged bony pelvis is osteopenic but intact.  L4-5 discectomy. Advanced L5-S1 degenerative disc disease causing moderate spinal canal stenosis and bilateral foraminal stenosis.  No acute intrapelvic soft tissue findings.  IMPRESSION: Total right hip arthroplasty complicated by periprosthetic proximal femur fracture, as above.   Electronically Signed   By:  Jorje Guild M.D.   On: 04/07/2014 07:01     Assessment/Plan Active Problems:   Periprosthetic hip fracture/Right thigh pain -As discussed above, I have consulted orthopedics for further recommendations-Dr. Marcelino Scot to see -Analgesics for pain management -DVT prophylaxis per orthopedics>> discuss with Dr. Marcelino Scot and he states to hold off Lovenox for now>> she has been placed on SCDs   Hypertension -Continue outpatient medications   Osteoarthritis -Continue pain management   Anxiety and depression -Continue outpatient medications    Hemochromatosis -appears is clinically stable, obtain LFTs and follow Leukocytosis -UA is unimpressive, and patient is asymptomatic, will obtain urine cultures follow and treat as clinically appropriate -Chest x-ray negative for acute findings -Possibly reactive, follow recheck      Code Status: Full  Family Communication: None at bedside Disposition Plan: To home when medically ready  Time spent: >59mins  [facility] Pager [phone]

## 2014-04-07 NOTE — Consult Note (Signed)
Orthopaedic Trauma Service Consultation  Reason for Consult: right periprosthetic hip fracture Referring Physician: Dillard Essex MD  Denise Lane is an 80 y.o. female.  HPI: 80 yo supremely pleasant female fell for the second time in two weeks with direct impact on right hip.  C/o pain and difficulty weight bearing. Denies numbness, weakness, or other injury. Previous nasal fracture and facial ecchymosis from the prior fall a week ago.  In each case, wearing slip on shoes that daughter has identified as potentially unstable.  Now c/o burning dysthesia in both feet as has missed two Neurontin doses. Scheduled for left THA in 4 weeks with Dr. Mardelle Matte.  Past Medical History  Diagnosis Date  . Hemochromatosis 1987  . Stenosis of esophagus   . Dysphagia   . Muscle pain   . History of colonic polyps     NOTED 11-20-09 IN COLONOSCOPY REPORT  . Diverticulosis of sigmoid colon 11-20-2009  . Hemorrhoids, internal 11-20-09  . Hyperlipidemia     no meds required  . Tremor, essential 10-29-09  . Hemochromatosis   . PONV (postoperative nausea and vomiting)   . Hypertension     takes Metoprolol daily  . History of bronchitis 1991-1996    "chronic; related to winter" (10/18/2012)  . History of blood transfusion     "S/P colonoscopy after polyp removed; got 2 units; esophageal bleed got 1 unit; really messed up hemochromatosis" (1/7/20214)  . Occasional tremors   . Joint pain   . Joint swelling   . Bruises easily   . GERD (gastroesophageal reflux disease)     takes OMeprazole daily  . H/O hiatal hernia   . Gastric ulcer   . Vomiting     occasionally  . Urinary urgency     takes Ditropan daily  . Osteoporosis   . Dry eyes     uses Refresh eye drops  . Rheumatic fever     hx of  . Osteoarthritis   . Osteoarthritis of right hip 10/18/2012  . Chronic lower back pain   . Depression     but doesn't require meds  . Kidney stones 1963  . Basal cell cancer     "face, legs" (10/18/2012)  . Squamous  carcinoma     "LLE" (10/18/2012)    Past Surgical History  Procedure Laterality Date  . Tonsillectomy  1953  . Appendectomy  1952  . Cholecystectomy  1977  . Tubal ligation  1975  . Lumbar disc surgery  1989  . Shoulder arthroscopy w/ rotator cuff repair  1998?    "left" (10/18/2012)  . Dilation and curettage of uterus  1975  . Cataract extraction w/ intraocular lens  implant, bilateral  2001    "both eyes" (10/18/2012)  . Esophagogastroduodenoscopy    . Colonoscopy    . Total hip arthroplasty  10/18/2012    "right" (10/18/2012)  . Skin cancer excision      "multiple" (10/18/2012)  . Total hip arthroplasty  10/18/2012    Procedure: TOTAL HIP ARTHROPLASTY;  Surgeon: Johnny Bridge, MD;  Location: [facility];  Service: Orthopedics;  Laterality: Right;    Family History  Problem Relation Age of Onset  . Hyperlipidemia Daughter     Social History:  reports that she has never smoked. She has never used smokeless tobacco. She reports that she does not drink alcohol or use illicit drugs.  Allergies:  Allergies  Allergen Reactions  . Codeine Nausea And Vomiting  . Dilaudid [Hydromorphone Hcl] Hives and Other (  See Comments)    "whelps" (10/18/2012)  . Macrodantin Hives and Other (See Comments)    "drug fever; chills; felt terrible" (10/18/2012)  . Morphine And Related Hives and Nausea And Vomiting  . Sulfa Antibiotics Hives, Rash and Other (See Comments)    "got real sick" (10/18/2012)    Medications: I have reviewed the patient's current medications.  Results for orders placed during the hospital encounter of 04/06/14 (from the past 48 hour(s))  CBC WITH DIFFERENTIAL     Status: Abnormal   Collection Time    04/06/14 11:54 PM      Result Value Ref Range   WBC 12.9 (*) 4.0 - 10.5 K/uL   RBC 3.70 (*) 3.87 - 5.11 MIL/uL   Hemoglobin 11.8 (*) 12.0 - 15.0 g/dL   HCT 36.1  36.0 - 46.0 %   MCV 97.6  78.0 - 100.0 fL   MCH 31.9  26.0 - 34.0 pg   MCHC 32.7  30.0 - 36.0 g/dL   RDW 12.1  11.5 - 15.5  %   Platelets 260  150 - 400 K/uL   Neutrophils Relative % 79 (*) 43 - 77 %   Neutro Abs 10.2 (*) 1.7 - 7.7 K/uL   Lymphocytes Relative 15  12 - 46 %   Lymphs Abs 1.9  0.7 - 4.0 K/uL   Monocytes Relative 6  3 - 12 %   Monocytes Absolute 0.8  0.1 - 1.0 K/uL   Eosinophils Relative 0  0 - 5 %   Eosinophils Absolute 0.0  0.0 - 0.7 K/uL   Basophils Relative 0  0 - 1 %   Basophils Absolute 0.0  0.0 - 0.1 K/uL  BASIC METABOLIC PANEL     Status: Abnormal   Collection Time    04/06/14 11:54 PM      Result Value Ref Range   Sodium 139  137 - 147 mEq/L   Potassium 3.9  3.7 - 5.3 mEq/L   Chloride 98  96 - 112 mEq/L   CO2 30  19 - 32 mEq/L   Glucose, Bld 121 (*) 70 - 99 mg/dL   BUN 20  6 - 23 mg/dL   Creatinine, Ser 0.54  0.50 - 1.10 mg/dL   Calcium 9.5  8.4 - 10.5 mg/dL   GFR calc non Af Amer 86 (*) >90 mL/min   GFR calc Af Amer >90  >90 mL/min   Comment: (NOTE)     The eGFR has been calculated using the CKD EPI equation.     This calculation has not been validated in all clinical situations.     eGFR's persistently <90 mL/min signify possible Chronic Kidney     Disease.  URINALYSIS, ROUTINE W REFLEX MICROSCOPIC     Status: Abnormal   Collection Time    04/07/14  3:09 AM      Result Value Ref Range   Color, Urine YELLOW  YELLOW   APPearance TURBID (*) CLEAR   Specific Gravity, Urine 1.018  1.005 - 1.030   pH 7.5  5.0 - 8.0   Glucose, UA NEGATIVE  NEGATIVE mg/dL   Hgb urine dipstick NEGATIVE  NEGATIVE   Bilirubin Urine NEGATIVE  NEGATIVE   Ketones, ur NEGATIVE  NEGATIVE mg/dL   Protein, ur NEGATIVE  NEGATIVE mg/dL   Urobilinogen, UA 0.2  0.0 - 1.0 mg/dL   Nitrite NEGATIVE  NEGATIVE   Leukocytes, UA NEGATIVE  NEGATIVE  URINE MICROSCOPIC-ADD ON     Status: Abnormal  Collection Time    04/07/14  3:09 AM      Result Value Ref Range   Squamous Epithelial / LPF RARE  RARE   Bacteria, UA MANY (*) RARE   Urine-Other AMORPHOUS URATES/PHOSPHATES    PROTIME-INR     Status: None    Collection Time    04/07/14 10:20 AM      Result Value Ref Range   Prothrombin Time 14.1  11.6 - 15.2 seconds   INR 1.09  0.00 - 1.49  HEPATIC FUNCTION PANEL     Status: Abnormal   Collection Time    04/07/14 10:20 AM      Result Value Ref Range   Total Protein 6.3  6.0 - 8.3 g/dL   Albumin 3.0 (*) 3.5 - 5.2 g/dL   AST 14  0 - 37 U/L   ALT 13  0 - 35 U/L   Alkaline Phosphatase 120 (*) 39 - 117 U/L   Total Bilirubin 0.6  0.3 - 1.2 mg/dL   Bilirubin, Direct <0.2  0.0 - 0.3 mg/dL   Indirect Bilirubin NOT CALCULATED  0.3 - 0.9 mg/dL    Dg Chest 1 View  04/07/2014   CLINICAL DATA:  Fall with right hip pain.  EXAM: CHEST - 1 VIEW  COMPARISON:  10/14/2012  FINDINGS: Normal heart size and upper mediastinal contours. There is cephalized blood flow related to supine state. No edema, effusion or visible pneumothorax. No evidence of fracture.  IMPRESSION: No active disease.   Electronically Signed   By: Jorje Guild M.D.   On: 04/07/2014 00:55   Dg Hip Complete Right  04/07/2014   CLINICAL DATA:  Right hip pain after fall.  EXAM: RIGHT HIP - COMPLETE 2+ VIEW  COMPARISON:  None.  FINDINGS: Status post right total hip arthroplasty. No acute fracture or dislocation is noted. Severe degenerative joint disease of the left hip joint is noted with associated deformity of left femoral head concerning for avascular necrosis.  IMPRESSION: Status post right total hip arthroplasty. No acute fracture dislocation is noted. Severe degenerative joint disease of left hip is noted.   Electronically Signed   By: Sabino Dick M.D.   On: 04/07/2014 00:52   Dg Femur Right  04/07/2014   CLINICAL DATA:  FALL  EXAM: RIGHT FEMUR - 2 VIEW  COMPARISON:  None.  FINDINGS: A right hip arthroplasties in place which is in gross anatomic alignment. No periprosthetic lucency to suggest loosening or infection. No acute fracture or dislocation.  Soft tissues within normal limits.  Degenerative spurring noted at the superior pole of  patella. No joint effusion seen within the suprapatellar recess.  IMPRESSION: 1. No acute fracture or dislocation. 2. Right hip arthroplasty in place without evidence of complication.   Electronically Signed   By: Jeannine Boga M.D.   On: 04/07/2014 00:55   Dg Ankle Complete Left  04/07/2014   CLINICAL DATA:  Left ankle pain.  EXAM: LEFT ANKLE COMPLETE - 3+ VIEW  COMPARISON:  March 28, 2014.  FINDINGS: Talar dome appears intact. No significant soft tissue swelling is noted. Degenerative changes seen involving the posterior talus and navicular bone. No acute fracture or dislocation is noted.  IMPRESSION: No acute abnormality seen in left ankle.   Electronically Signed   By: Sabino Dick M.D.   On: 04/07/2014 00:56   Ct Head Wo Contrast  04/07/2014   CLINICAL DATA:  Fall with head injury.  EXAM: CT HEAD WITHOUT CONTRAST  CT CERVICAL SPINE  WITHOUT CONTRAST  TECHNIQUE: Multidetector CT imaging of the head and cervical spine was performed following the standard protocol without intravenous contrast. Multiplanar CT image reconstructions of the cervical spine were also generated.  COMPARISON:  None.  FINDINGS: CT HEAD FINDINGS  Skull and Sinuses:Negative for calvarial fracture. There is mild scalp contusion in the right parietal region. There is a stable fracture lucency through the left nasal bone, first noted 10 days prior.  Orbits: No evidence of injury.  Bilateral cataract resection.  Brain: No evidence of acute abnormality, such as acute infarction, hemorrhage, hydrocephalus, or mass lesion/mass effect. There is age related generalized cerebral volume loss. Patchy bilateral deep and subcortical cerebral white matter low density consistent with chronic small vessel disease.  CT CERVICAL SPINE FINDINGS  No acute fracture identified. There is mild (approximately 2 mm) anterolisthesis at C5-6, C6-7, and C7-T1. These are associated with advanced facet osteoarthritis, the most likely cause. There is no  subluxation of the facet joints. The C5-6 and C6-7 anterolisthesis was present on recent facial CT.  No gross cervical canal hematoma or prevertebral edema. Cervical degenerative changes are most notable in the facet joints, with ankylosis present at C2-3 and C3-4. There is ossified ligamentous buckling and hypertrophy posteriorly at C4-5 and C5-6, resulting in posterior CSF effacement. The bones are osteopenic. There is subpleural dependent lung opacity which is bilaterally symmetric, favoring scarring.  IMPRESSION: 1. No evidence of acute intracranial injury. Negative for cervical spine fracture. 2. Mid and lower cervical anterolisthesis associated with advanced facet osteoarthritis. 3. Senescent changes include brain atrophy and chronic small vessel disease.   Electronically Signed   By: Jorje Guild M.D.   On: 04/07/2014 02:33   Ct Cervical Spine Wo Contrast  04/07/2014   CLINICAL DATA:  Fall with head injury.  EXAM: CT HEAD WITHOUT CONTRAST  CT CERVICAL SPINE WITHOUT CONTRAST  TECHNIQUE: Multidetector CT imaging of the head and cervical spine was performed following the standard protocol without intravenous contrast. Multiplanar CT image reconstructions of the cervical spine were also generated.  COMPARISON:  None.  FINDINGS: CT HEAD FINDINGS  Skull and Sinuses:Negative for calvarial fracture. There is mild scalp contusion in the right parietal region. There is a stable fracture lucency through the left nasal bone, first noted 10 days prior.  Orbits: No evidence of injury.  Bilateral cataract resection.  Brain: No evidence of acute abnormality, such as acute infarction, hemorrhage, hydrocephalus, or mass lesion/mass effect. There is age related generalized cerebral volume loss. Patchy bilateral deep and subcortical cerebral white matter low density consistent with chronic small vessel disease.  CT CERVICAL SPINE FINDINGS  No acute fracture identified. There is mild (approximately 2 mm) anterolisthesis at  C5-6, C6-7, and C7-T1. These are associated with advanced facet osteoarthritis, the most likely cause. There is no subluxation of the facet joints. The C5-6 and C6-7 anterolisthesis was present on recent facial CT.  No gross cervical canal hematoma or prevertebral edema. Cervical degenerative changes are most notable in the facet joints, with ankylosis present at C2-3 and C3-4. There is ossified ligamentous buckling and hypertrophy posteriorly at C4-5 and C5-6, resulting in posterior CSF effacement. The bones are osteopenic. There is subpleural dependent lung opacity which is bilaterally symmetric, favoring scarring.  IMPRESSION: 1. No evidence of acute intracranial injury. Negative for cervical spine fracture. 2. Mid and lower cervical anterolisthesis associated with advanced facet osteoarthritis. 3. Senescent changes include brain atrophy and chronic small vessel disease.   Electronically Signed   By: Roderic Palau  Watts M.D.   On: 04/07/2014 02:33   Ct Hip Right Wo Contrast  04/07/2014   CLINICAL DATA:  Fall with right hip pain  EXAM: CT OF THE RIGHT HIP WITHOUT CONTRAST  TECHNIQUE: Multidetector CT imaging was performed according to the standard protocol. Multiplanar CT image reconstructions were also generated.  COMPARISON:  Right hip radiography from the same day  FINDINGS: Total right hip arthroplasty. There is an acute, comminuted periprosthetic fracture involving the greater trochanter, extending along the anterior and posterior aspect of the intertrochanteric femur, ending approximately 5 cm below the horizontal intertrochanteric portion of the implant. The fracture is essentially nondisplaced but there is an anterior fragment which is mildly distracted, but nondisplaced. There is mild muscular compartment edema around the fracture, without measurable hematoma. The acetabular cup is well seated and the prosthesis is located. The imaged bony pelvis is osteopenic but intact.  L4-5 discectomy. Advanced L5-S1  degenerative disc disease causing moderate spinal canal stenosis and bilateral foraminal stenosis.  No acute intrapelvic soft tissue findings.  IMPRESSION: Total right hip arthroplasty complicated by periprosthetic proximal femur fracture, as above.   Electronically Signed   By: Jorje Guild M.D.   On: 04/07/2014 07:01    ROS Blood pressure 145/66, pulse 74, temperature 98.2 F (36.8 C), temperature source Oral, resp. rate 16, height _0  (1.676 m), weight 110 lb (49.896 kg), SpO2 97.00%. Physical Exam Pelvis--no traumatic wounds or rash, no ecchymosis, stable to manual stress, nontender RLE No traumatic wounds, ecchymosis, or rash  Tenderness of greater troch  No knee effusion nor ecchymosis  Sens DPN, SPN, TN intact  Motor EHL, ext, flex, evers 5/5  DP 2+, No edema  Assessment/Plan: Right periprosthetic trochanteric fracture without instability  1. PT/OT for WBAT with TROCHANTERIC PRECAUTIONS (and post sign), walker for balance 2. Restart Neurontin asap and will give 648m dose now then resume [id number] 3. Follow up with Dr. LaMardelle Matteost d/c, which I would anticipate tomorrow or Monday depending on pain and mobilization progress; will likely adjust pending left hip arthroplasty  [place] [place] [facility] [phone])   04/07/2014  12:22 PM

## 2014-04-08 DIAGNOSIS — T84049A Periprosthetic fracture around unspecified internal prosthetic joint, initial encounter: Secondary | ICD-10-CM | POA: Diagnosis not present

## 2014-04-08 DIAGNOSIS — W19XXXA Unspecified fall, initial encounter: Secondary | ICD-10-CM | POA: Diagnosis not present

## 2014-04-08 DIAGNOSIS — Z96649 Presence of unspecified artificial hip joint: Secondary | ICD-10-CM | POA: Diagnosis not present

## 2014-04-08 LAB — BASIC METABOLIC PANEL
BUN: 15 mg/dL (ref 6–23)
CALCIUM: 8.9 mg/dL (ref 8.4–10.5)
CO2: 30 mEq/L (ref 19–32)
Chloride: 96 mEq/L (ref 96–112)
Creatinine, Ser: 0.52 mg/dL (ref 0.50–1.10)
GFR calc Af Amer: >90 mL/min (ref >90)
GFR, EST NON AFRICAN AMERICAN: 87 mL/min — AB (ref >90)
Glucose, Bld: 111 mg/dL — ABNORMAL HIGH (ref 70–99)
Potassium: 3.6 mEq/L — ABNORMAL LOW (ref 3.7–5.3)
SODIUM: 138 meq/L (ref 137–147)

## 2014-04-08 LAB — CBC
HCT: 34.4 % — ABNORMAL LOW (ref 36.0–46.0)
Hemoglobin: 11.5 g/dL — ABNORMAL LOW (ref 12.0–15.0)
MCH: 32.1 pg (ref 26.0–34.0)
MCHC: 33.4 g/dL (ref 30.0–36.0)
MCV: 96.1 fL (ref 78.0–100.0)
PLATELETS: 251 10*3/uL (ref 150–400)
RBC: 3.58 MIL/uL — ABNORMAL LOW (ref 3.87–5.11)
RDW: 12.1 % (ref 11.5–15.5)
WBC: 10.6 10*3/uL — ABNORMAL HIGH (ref 4.0–10.5)

## 2014-04-08 MED ORDER — ENOXAPARIN SODIUM 40 MG/0.4ML ~~LOC~~ SOLN
40.0000 mg | SUBCUTANEOUS | Status: DC
  Administered 2014-04-08 – 2014-04-09 (×2): 40 mg via SUBCUTANEOUS
  Filled 2014-04-08 (×2): qty 0.4

## 2014-04-08 NOTE — Progress Notes (Signed)
[facility] PROGRESS NOTE  Denise Lane URK:[id number] DOB: 15-Feb-1930 DOA: 04/06/2014 PCP: Delrae Rend, NP  Assessment/Plan: Periprosthetic hip fracture/Right thigh pain  -appreciate orthopedics input, fracture not unstable>> await PT eval -continue pain management  -DVT prophylaxis per orthopedics>> discuss with Dr. Marcelino Scot and he states to hold off Lovenox for now>> she has been placed on SCDs  Hypertension  -Continue outpatient medications  Osteoarthritis  -Continue pain management  Anxiety and depression  -Continue outpatient medications  Hemochromatosis  -appears is clinically stable, obtain LFTs and follow  Leukocytosis  -UA is unimpressive, and patient is asymptomatic, will obtain urine cultures follow and treat as clinically appropriate  -Chest x-ray negative for acute findings  -likely reactive, resolved   Code Status: full Family Communication: none at bedside Disposition Plan: pending PT eval   Consultants:  ortho  Procedures:  none  Antibiotics:  none  HPI/Subjective: Was able sit up in chair today, but states still with increased R. Leg pain.  Objective: Filed Vitals:   04/08/14 1158  BP:   Pulse:   Temp:   Resp: 16    Intake/Output Summary (Last 24 hours) at 04/08/14 1359 Last data filed at 04/08/14 0650  Gross per 24 hour  Intake    480 ml  Output      0 ml  Net    480 ml   Filed Weights   04/06/14 2101  Weight: 49.896 kg (110 lb)    Exam:  General: alert & oriented x 3 In NAD Cardiovascular: RRR, nl S1 s2 Respiratory: CTAB Abdomen: soft +BS NT/ND, no masses palpable Extremities: No cyanosis and no edema     Data Reviewed: Basic Metabolic Panel:  Recent Labs Lab 04/06/14 2354 04/08/14 0514  NA 139 138  K 3.9 3.6*  CL 98 96  CO2 30 30  GLUCOSE 121* 111*  BUN 20 15  CREATININE 0.54 0.52  CALCIUM 9.5 8.9   Liver Function Tests:  Recent Labs Lab 04/07/14 1020  AST 14  ALT 13  ALKPHOS 120*   BILITOT 0.6  PROT 6.3  ALBUMIN 3.0*   No results found for this basename: LIPASE, AMYLASE,  in the last 168 hours No results found for this basename: AMMONIA,  in the last 168 hours CBC:  Recent Labs Lab 04/06/14 2354 04/08/14 0514  WBC 12.9* 10.6*  NEUTROABS 10.2*  --   HGB 11.8* 11.5*  HCT 36.1 34.4*  MCV 97.6 96.1  PLT 260 251   Cardiac Enzymes: No results found for this basename: CKTOTAL, CKMB, CKMBINDEX, TROPONINI,  in the last 168 hours BNP (last 3 results) No results found for this basename: PROBNP,  in the last 8760 hours CBG: No results found for this basename: GLUCAP,  in the last 168 hours  No results found for this or any previous visit (from the past 240 hour(s)).   Studies: Dg Chest 1 View  04/07/2014   CLINICAL DATA:  Fall with right hip pain.  EXAM: CHEST - 1 VIEW  COMPARISON:  10/14/2012  FINDINGS: Normal heart size and upper mediastinal contours. There is cephalized blood flow related to supine state. No edema, effusion or visible pneumothorax. No evidence of fracture.  IMPRESSION: No active disease.   Electronically Signed   By: Jorje Guild M.D.   On: 04/07/2014 00:55   Dg Hip Complete Right  04/07/2014   CLINICAL DATA:  Right hip pain after fall.  EXAM: RIGHT HIP - COMPLETE 2+ VIEW  COMPARISON:  None.  FINDINGS:  Status post right total hip arthroplasty. No acute fracture or dislocation is noted. Severe degenerative joint disease of the left hip joint is noted with associated deformity of left femoral head concerning for avascular necrosis.  IMPRESSION: Status post right total hip arthroplasty. No acute fracture dislocation is noted. Severe degenerative joint disease of left hip is noted.   Electronically Signed   By: Sabino Dick M.D.   On: 04/07/2014 00:52   Dg Femur Right  04/07/2014   CLINICAL DATA:  FALL  EXAM: RIGHT FEMUR - 2 VIEW  COMPARISON:  None.  FINDINGS: A right hip arthroplasties in place which is in gross anatomic alignment. No  periprosthetic lucency to suggest loosening or infection. No acute fracture or dislocation.  Soft tissues within normal limits.  Degenerative spurring noted at the superior pole of patella. No joint effusion seen within the suprapatellar recess.  IMPRESSION: 1. No acute fracture or dislocation. 2. Right hip arthroplasty in place without evidence of complication.   Electronically Signed   By: Jeannine Boga M.D.   On: 04/07/2014 00:55   Dg Ankle Complete Left  04/07/2014   CLINICAL DATA:  Left ankle pain.  EXAM: LEFT ANKLE COMPLETE - 3+ VIEW  COMPARISON:  March 28, 2014.  FINDINGS: Talar dome appears intact. No significant soft tissue swelling is noted. Degenerative changes seen involving the posterior talus and navicular bone. No acute fracture or dislocation is noted.  IMPRESSION: No acute abnormality seen in left ankle.   Electronically Signed   By: Sabino Dick M.D.   On: 04/07/2014 00:56   Ct Head Wo Contrast  04/07/2014   CLINICAL DATA:  Fall with head injury.  EXAM: CT HEAD WITHOUT CONTRAST  CT CERVICAL SPINE WITHOUT CONTRAST  TECHNIQUE: Multidetector CT imaging of the head and cervical spine was performed following the standard protocol without intravenous contrast. Multiplanar CT image reconstructions of the cervical spine were also generated.  COMPARISON:  None.  FINDINGS: CT HEAD FINDINGS  Skull and Sinuses:Negative for calvarial fracture. There is mild scalp contusion in the right parietal region. There is a stable fracture lucency through the left nasal bone, first noted 10 days prior.  Orbits: No evidence of injury.  Bilateral cataract resection.  Brain: No evidence of acute abnormality, such as acute infarction, hemorrhage, hydrocephalus, or mass lesion/mass effect. There is age related generalized cerebral volume loss. Patchy bilateral deep and subcortical cerebral white matter low density consistent with chronic small vessel disease.  CT CERVICAL SPINE FINDINGS  No acute fracture  identified. There is mild (approximately 2 mm) anterolisthesis at C5-6, C6-7, and C7-T1. These are associated with advanced facet osteoarthritis, the most likely cause. There is no subluxation of the facet joints. The C5-6 and C6-7 anterolisthesis was present on recent facial CT.  No gross cervical canal hematoma or prevertebral edema. Cervical degenerative changes are most notable in the facet joints, with ankylosis present at C2-3 and C3-4. There is ossified ligamentous buckling and hypertrophy posteriorly at C4-5 and C5-6, resulting in posterior CSF effacement. The bones are osteopenic. There is subpleural dependent lung opacity which is bilaterally symmetric, favoring scarring.  IMPRESSION: 1. No evidence of acute intracranial injury. Negative for cervical spine fracture. 2. Mid and lower cervical anterolisthesis associated with advanced facet osteoarthritis. 3. Senescent changes include brain atrophy and chronic small vessel disease.   Electronically Signed   By: Jorje Guild M.D.   On: 04/07/2014 02:33   Ct Cervical Spine Wo Contrast  04/07/2014   CLINICAL DATA:  Fall with head injury.  EXAM: CT HEAD WITHOUT CONTRAST  CT CERVICAL SPINE WITHOUT CONTRAST  TECHNIQUE: Multidetector CT imaging of the head and cervical spine was performed following the standard protocol without intravenous contrast. Multiplanar CT image reconstructions of the cervical spine were also generated.  COMPARISON:  None.  FINDINGS: CT HEAD FINDINGS  Skull and Sinuses:Negative for calvarial fracture. There is mild scalp contusion in the right parietal region. There is a stable fracture lucency through the left nasal bone, first noted 10 days prior.  Orbits: No evidence of injury.  Bilateral cataract resection.  Brain: No evidence of acute abnormality, such as acute infarction, hemorrhage, hydrocephalus, or mass lesion/mass effect. There is age related generalized cerebral volume loss. Patchy bilateral deep and subcortical cerebral  white matter low density consistent with chronic small vessel disease.  CT CERVICAL SPINE FINDINGS  No acute fracture identified. There is mild (approximately 2 mm) anterolisthesis at C5-6, C6-7, and C7-T1. These are associated with advanced facet osteoarthritis, the most likely cause. There is no subluxation of the facet joints. The C5-6 and C6-7 anterolisthesis was present on recent facial CT.  No gross cervical canal hematoma or prevertebral edema. Cervical degenerative changes are most notable in the facet joints, with ankylosis present at C2-3 and C3-4. There is ossified ligamentous buckling and hypertrophy posteriorly at C4-5 and C5-6, resulting in posterior CSF effacement. The bones are osteopenic. There is subpleural dependent lung opacity which is bilaterally symmetric, favoring scarring.  IMPRESSION: 1. No evidence of acute intracranial injury. Negative for cervical spine fracture. 2. Mid and lower cervical anterolisthesis associated with advanced facet osteoarthritis. 3. Senescent changes include brain atrophy and chronic small vessel disease.   Electronically Signed   By: Jorje Guild M.D.   On: 04/07/2014 02:33   Ct Hip Right Wo Contrast  04/07/2014   CLINICAL DATA:  Fall with right hip pain  EXAM: CT OF THE RIGHT HIP WITHOUT CONTRAST  TECHNIQUE: Multidetector CT imaging was performed according to the standard protocol. Multiplanar CT image reconstructions were also generated.  COMPARISON:  Right hip radiography from the same day  FINDINGS: Total right hip arthroplasty. There is an acute, comminuted periprosthetic fracture involving the greater trochanter, extending along the anterior and posterior aspect of the intertrochanteric femur, ending approximately 5 cm below the horizontal intertrochanteric portion of the implant. The fracture is essentially nondisplaced but there is an anterior fragment which is mildly distracted, but nondisplaced. There is mild muscular compartment edema around the  fracture, without measurable hematoma. The acetabular cup is well seated and the prosthesis is located. The imaged bony pelvis is osteopenic but intact.  L4-5 discectomy. Advanced L5-S1 degenerative disc disease causing moderate spinal canal stenosis and bilateral foraminal stenosis.  No acute intrapelvic soft tissue findings.  IMPRESSION: Total right hip arthroplasty complicated by periprosthetic proximal femur fracture, as above.   Electronically Signed   By: Jorje Guild M.D.   On: 04/07/2014 07:01    Scheduled Meds: . calcium citrate  1 tablet Oral Daily  . cholecalciferol  1,000 Units Oral BID  . feeding supplement (ENSURE)  1 Container Oral BID BM  . gabapentin  300 mg Oral TID  . losartan  50 mg Oral Daily  . metoprolol tartrate  25 mg Oral Daily  . mirabegron ER  25 mg Oral Daily  . pantoprazole  40 mg Oral Daily  . senna  1 tablet Oral BID  . venlafaxine XR  150 mg Oral Q breakfast   Continuous Infusions:  Active Problems:   Hemochromatosis   Hypertension   Osteoarthritis   Anxiety and depression   Right thigh pain   Periprosthetic hip fracture   Hip fracture    Time spent: [facility] Pager [phone]. If 7PM-7AM, please contact night-coverage at [email or web address], password [facility] 04/08/2014, 1:59 PM  LOS: 2 days

## 2014-04-08 NOTE — Progress Notes (Signed)
[facility] Progress Note  Subjective  Doing well this am Pain improved but R hip sore  Got to bedside chair with assist  No other complaints    Objective   BP 126/58  Pulse 87  Temp(Src) 97.3 F (36.3 C) (Oral)  Resp 18  Ht 5\' 6"  (1.676 m)  Wt 49.896 kg (110 lb)  BMI 17.76 kg/m2  SpO2 97%  Intake/Output     06/27 0701 - 06/28 0700 06/28 0701 - 06/29 0700   P.O. 480    Total Intake(mL/kg) 480 (9.6)    Total Output 0     Net +480          Urine Occurrence 2 x      Labs Results for Denise, Lane (MRN [id number]) as of 04/08/2014 11:07  Ref. Range 04/08/2014 05:14  Sodium Latest Range: 136-145 mEq/L 138  Potassium Latest Range: 3.5-5.1 mEq/L 3.6 (L)  Chloride Latest Range: 96-112 mEq/L 96  CO2 Latest Range: 19-32 mEq/L 30  BUN Latest Range: 7.0-26.0 mg/dL 15  Creatinine Latest Range: 0.50-1.10 mg/dL 0.52  Calcium Latest Range: 8.4-10.5 mg/dL 8.9  GFR calc non Af Amer Latest Range: >90 mL/min 87 (L)  GFR calc Af Amer Latest Range: >90 mL/min >90  Glucose Latest Range: 70-99 mg/dl 111 (H)  WBC Latest Range: 3.4-10.8 x10E3/uL 10.6 (H)  RBC Latest Range: 3.77-5.28 x10E6/uL 3.58 (L)  Hemoglobin Latest Range: 12.0-15.0 g/dL 11.5 (L)  HCT Latest Range: 36.0-46.0 % 34.4 (L)  MCV Latest Range: 78.0-100.0 fL 96.1  MCH Latest Range: 26.6-33.0 pg 32.1  MCHC Latest Range: 31.5-35.7 g/dL 33.4  RDW Latest Range: 12.3-15.4 % 12.1  Platelets Latest Range: 150-400 K/uL 251    Exam  Gen: awake and alert, NAD, comfortable in bedside chair  Ext:       Right Lower Extremity   + TTP greater troch  No significant ecchymosis   Knee nontender  DPN, SPN, TN sensation intact  EHL, FHL, and, PT, peroneals and gastroc motor intact. Intact quad set, patient can actively flex knee as well  No significant swelling             Extremity is warm    + Dorsalis pedis pulse   Assessment and Plan   POD/HD#: 58  80 year old female with right periprosthetic greater  trochanteric fracture without implant instability  1. Stable right periprosthetic greater trochanteric fracture  Weight-bear as tolerated with trochanteric precautions  PT/OT consult  Ice as needed  Walker for balance   This injury will likely delay her pending left total hip arthroplasty at the end of July, defer to Dr. Mardelle Matte  2. chronic pain  Continue with Neurontin  3. Disposition  PT and OT  Likely stable for discharge tomorrow   Jari Pigg, PA-C [facility] [phone] (P) 04/08/2014 11:06 AM  **Disclaimer: This note may have been dictated with voice recognition software. Similar sounding words can inadvertently be transcribed and this note may contain transcription errors which may not have been corrected upon publication of note.**

## 2014-04-09 ENCOUNTER — Encounter: Payer: Self-pay | Admitting: Nurse Practitioner

## 2014-04-09 DIAGNOSIS — T84049A Periprosthetic fracture around unspecified internal prosthetic joint, initial encounter: Secondary | ICD-10-CM | POA: Diagnosis not present

## 2014-04-09 DIAGNOSIS — M79609 Pain in unspecified limb: Secondary | ICD-10-CM | POA: Diagnosis not present

## 2014-04-09 DIAGNOSIS — Z96649 Presence of unspecified artificial hip joint: Secondary | ICD-10-CM | POA: Diagnosis not present

## 2014-04-09 MED ORDER — VENLAFAXINE HCL ER 37.5 MG PO CP24
37.5000 mg | ORAL_CAPSULE | Freq: Every day | ORAL | Status: DC
  Administered 2014-04-09: 37.5 mg via ORAL
  Filled 2014-04-09 (×2): qty 1

## 2014-04-09 MED ORDER — ENOXAPARIN SODIUM 40 MG/0.4ML ~~LOC~~ SOLN: 40.0000 mg | SUBCUTANEOUS | Status: DC

## 2014-04-09 MED ORDER — METHOCARBAMOL 500 MG PO TABS: 500.0000 mg | ORAL_TABLET | Freq: Four times a day (QID) | ORAL | Status: DC | PRN

## 2014-04-09 MED ORDER — TRAMADOL HCL 50 MG PO TABS: ORAL_TABLET | ORAL | Status: DC

## 2014-04-09 NOTE — Progress Notes (Signed)
Orthopaedic Trauma Service Progress Note  Subjective  Doing ok No new issues  Slept well yesterday  R hip is sore   Working with therapy   Objective   BP 120/53  Pulse 73  Temp(Src) 97.7 F (36.5 C) (Oral)  Resp 18  Ht 5\' 6"  (1.676 m)  Wt 49.896 kg (110 lb)  BMI 17.76 kg/m2  SpO2 97%  Intake/Output     06/28 0701 - 06/29 0700 06/29 0701 - 06/30 0700   P.O. 840    Total Intake(mL/kg) 840 (16.8)    Total Output       Net +840          Urine Occurrence 3 x      Labs  No new labs   Exam  Gen: sitting on EOB, appears well Ext:       Right Lower Extremity   No change in exam     + TTP greater troch             No significant ecchymosis               Knee nontender             DPN, SPN, TN sensation intact             EHL, FHL, and, PT, peroneals and gastroc motor intact. Intact quad set, patient can actively flex knee as well             No significant swelling             Extremity is warm             + Dorsalis pedis pulse   Assessment and Plan   POD/HD#:    80 year old female with right periprosthetic greater trochanteric fracture without implant instability  1. Stable right periprosthetic greater trochanteric fracture             Weight-bear as tolerated with trochanteric precautions             PT/OT consult             Ice as needed             Walker for balance              This injury will likely delay her pending left total hip arthroplasty at the end of July, defer to Dr. Mardelle Matte  2. chronic pain             Continue with Neurontin  3. DVT/PE prophylaxis  Continue lovenox for now  Will continue for 2 weeks at dc   4. Disposition             PT and OT             pts only help at home is her husband who himself is recovering from a stroke.  Unsafe for pt to return there.  Will order CIR consult     Jari Pigg, PA-C [facility] [phone] (P) 04/09/2014 8:27 AM  **Disclaimer: This note may have been dictated  with voice recognition software. Similar sounding words can inadvertently be transcribed and this note may contain transcription errors which may not have been corrected upon publication of note.**

## 2014-04-09 NOTE — Evaluation (Signed)
Physical Therapy Evaluation Patient Details Name: Denise Lane MRN: [id number] DOB: [birth year] Today's Date: 04/09/2014   History of Present Illness  pt presents after fall sustaining R Trochanteric Hip fx.    Clinical Impression  Pt very motivated and eager to recover.  Pt would benefit from CIR at D/C to maximize independence prior to returning home with husband who has had a CVA.  Pt indicates her husband has cognitive deficits and that she takes care of him.  Will continue to follow.      Follow Up Recommendations CIR    Equipment Recommendations  None recommended by PT    Recommendations for Other Services Rehab consult     Precautions / Restrictions Precautions Precautions: Posterior Hip Precaution Booklet Issued: Yes (comment) Precaution Comments: **NO ACTIVE ABDUCTION** Restrictions Weight Bearing Restrictions: Yes RLE Weight Bearing: Weight bearing as tolerated      Mobility  Bed Mobility Overal bed mobility: Needs Assistance Bed Mobility: Supine to Sit     Supine to sit: Mod assist     General bed mobility comments: A with R LE to prevent active abduction and with bringing trunk up to sitting.    Transfers Overall transfer level: Needs assistance Equipment used: Rolling walker (2 wheeled) Transfers: Sit to/from Stand Sit to Stand: Mod assist         General transfer comment: cues for UE use.  pt moves very slowly 2/2 pain in R hip.  pt does well to attempt to control descent to sitting.    Ambulation/Gait Ambulation/Gait assistance: Min assist Ambulation Distance (Feet): 5 Feet Assistive device: Rolling walker (2 wheeled) Gait Pattern/deviations: Step-through pattern;Decreased step length - left;Decreased stance time - right;Decreased stride length;Trunk flexed     General Gait Details: pt moves slowly and leans heavily on RW.  Distance limited by pain and weakness.    Stairs            Wheelchair Mobility    Modified Rankin (Stroke  Patients Only)       Balance Overall balance assessment: Needs assistance Sitting-balance support: Single extremity supported;Feet supported Sitting balance-Leahy Scale: Poor Sitting balance - Comments: pt needs at least one UE support 2/2 pain in R hip.     Standing balance support: Bilateral upper extremity supported Standing balance-Leahy Scale: Poor Standing balance comment: pt relies heavily on RW for support.                               Pertinent Vitals/Pain R hip 10/10 with mobility.  RN called to medicate.      Home Living Family/patient expects to be discharged to:: Inpatient rehab Living Arrangements: Spouse/significant other               Additional Comments: Husband has had a CVA and is not able to provide any consistent A per pt.  Her daughter has taken off of work to take care of husband while pt is in hospital.      Prior Function Level of Independence: Independent         Comments: pt took care of her husband     Hand Dominance        Extremity/Trunk Assessment   Upper Extremity Assessment: Defer to OT evaluation           Lower Extremity Assessment: RLE deficits/detail;LLE deficits/detail RLE Deficits / Details: Foot/knee ROM WFL, hip limited by pain.   LLE Deficits / Details: pt has  planned L THA this summer 2/2 painful hip joint and audible crepitis.       Communication   Communication: No difficulties  Cognition Arousal/Alertness: Awake/alert Behavior During Therapy: WFL for tasks assessed/performed Overall Cognitive Status: No family/caregiver present to determine baseline cognitive functioning       Memory: Decreased short-term memory              General Comments      Exercises        Assessment/Plan    PT Assessment Patient needs continued PT services  PT Diagnosis Difficulty walking;Acute pain;Generalized weakness   PT Problem List Decreased strength;Decreased activity tolerance;Decreased  balance;Decreased mobility;Decreased knowledge of use of DME;Decreased knowledge of precautions;Pain  PT Treatment Interventions DME instruction;Gait training;Stair training;Functional mobility training;Therapeutic activities;Therapeutic exercise;Balance training;Patient/family education   PT Goals (Current goals can be found in the Care Plan section) Acute Rehab PT Goals Patient Stated Goal: Get better so she can get her L THA this summer.   PT Goal Formulation: With patient Time For Goal Achievement: 04/23/14 Potential to Achieve Goals: Good    Frequency Min 3X/week   Barriers to discharge Decreased caregiver support pt takes care of her husband since his CVA.  He has cognitive difficulties.      Co-evaluation               End of Session Equipment Utilized During Treatment: Gait belt Activity Tolerance: Patient limited by pain Patient left: in chair;with call bell/phone within reach Nurse Communication: Mobility status         Time: 0349-1791 PT Time Calculation (min): 33 min   Charges:   PT Evaluation $Initial PT Evaluation Tier I: 1 Procedure PT Treatments $Gait Training: 8-22 mins $Therapeutic Activity: 8-22 mins   PT G CodesCatarina Hartshorn, Dawson 04/09/2014, 9:38 AM

## 2014-04-09 NOTE — Care Management Note (Signed)
CARE MANAGEMENT NOTE 04/09/2014  Patient:  Denise Lane, Denise Lane   Account Number:  [id number]  Date Initiated:  04/07/2014  Documentation initiated by:  Ricki Miller  Subjective/Objective Assessment:   80 yr old female s/p right proximal femoral fx after a fall. Patient states she cares for her husband, who has had a stroke. Has a daughter who helps out.Has walker, rollator and 3in1.     Action/Plan:   PT/OT eval  Case manager will continue to follow.  Has used [facility] in the past, will use them now if needed.   Anticipated DC Date:  04/10/2014   Anticipated DC Plan:  [place]  CM consult      [facility] Choice  HOME HEALTH   Choice offered to / List presented to:  C-1 Patient        [place] arranged  [place].   Status of service:  Completed, signed off Medicare Important Message given?  YES (If response is "NO", the following Medicare IM given date fields will be blank) Date Medicare IM given:  04/06/2014 Date Additional Medicare IM given:  04/09/2014  Discharge Disposition:  Early

## 2014-04-09 NOTE — Progress Notes (Signed)
Patient d/c to home, IV removed, prescriptions given, instructions reviewed.  Lovenox teaching given.

## 2014-04-09 NOTE — Discharge Summary (Addendum)
Physician Discharge Summary  [name] PJA:[id number] DOB: 10-07-1930 DOA: 04/06/2014  PCP: Delrae Rend, NP  Admit date: 04/06/2014 Discharge date: 04/09/2014  Time spent: >44minutes  Recommendations for Outpatient Follow-up:  Follow-up Information   Follow up with [facility]. (Someone from [facility] will contact you concerning start date and time for physical therapy.)    Contact information:   [address] [place] [address] [phone]       Follow up with Delrae Rend, NP. (in [place], call for appt upon discharge)    Specialty:  Nurse Practitioner   Contact information:   [place]. [place] [address] [phone]       Follow up with Johnny Bridge, MD. (in 2weeks, call for appt upon discharge)    Specialty:  Orthopedic Surgery   Contact information:   [place]. [place] [address] [phone]        Discharge Diagnoses:  Active Problems:   Hemochromatosis   Hypertension   Osteoarthritis   Anxiety and depression   Right thigh pain   Periprosthetic hip fracture   Hip fracture   Discharge Condition: Improved/stable  Diet recommendation: Low sodium heart healthy  Filed Weights   04/06/14 2101  Weight: 49.896 kg (110 lb)    History of present illness:  Denise Lane is a 80 y.o. female with history of osteoarthritis and status post right total hip replacement in January of 2014, osteoporosis, hypertension, hemochromatosis who presents with above complaints. She reports that she sustained a fall last month and strained her left ankle and she believes it 'just gave way' yesterday and caused her to fall. She denies chest pain, dizziness, fevers dysuria, cough, shortness of breath and no focal weakness. She was seen in the ED and plain x-rays were negative, CT was done and revealed a right periprosthetic proximal femoral fracture. Chest x-ray was negative for  acute findings. She is admitted for further evaluation and management.   Hospital Course:  Stable Periprosthetic hip fracture/Right thigh pain  -As discussed above upon admission the patient was started on analgesics and muscle relaxants as needed for pain management - orthopedics was consulted and Dr. Marcelino Scot saw patient and stated that the fracture fracture not unstable, no surgical intervention recommended >>PT was consulted and evaluated patient and recommended CIR -CIR was consulted, but in discussing this recommendation with the patient she strongly refused stating that she had to go home to take care of her husband was dementia and she did not have anyone else to care for her at home>> I discussed the risks of falls resulting in an unstable fracture or worse(her niece present) she voiced understanding but still declined CIR -She is agreeable to home health services and this will be set up per CM upon discharge, she is to followup with Dr. Mardelle Matte outpatient -Orthopedics noted that this issue will likely delay her pending left total hip arthroplasty had been planned for the end of July per Dr. Mardelle Matte -Weight bearing as tolerated with trochanteric precautions and walker for balance recommended per orthopedics. -orthopedics requested that she be dc'ed on lovenox for 2wks Hypertension  -Continue outpatient medications  Osteoarthritis  -Continue pain management  Anxiety and depression  -Continue outpatient medications  Hemochromatosis  -appears is clinically stable, obtain LFTs and follow  Leukocytosis  -UA is unimpressive, and patient is asymptomatic -Chest x-ray negative for acute findings  -likely reactive, resolved on no antibiotics.   Procedures:  None  Consultations:  Orthopedics  Discharge Exam: Filed Vitals:   04/09/14 1327  BP: 124/53  Pulse: 71  Temp: 98.6 F (37 C)  Resp: 18   Exam:  General: alert & oriented x 3 In NAD  Cardiovascular: RRR, nl S1 s2   Respiratory: CTAB  Abdomen: soft +BS NT/ND, no masses palpable  Extremities: No cyanosis and no edema    Discharge Instructions You were cared for by a hospitalist during your hospital stay. If you have any questions about your discharge medications or the care you received while you were in the hospital after you are discharged, you can call the unit and asked to speak with the hospitalist on call if the hospitalist that took care of you is not available. Once you are discharged, your primary care physician will handle any further medical issues. Please note that NO REFILLS for any discharge medications will be authorized once you are discharged, as it is imperative that you return to your primary care physician (or establish a relationship with a primary care physician if you do not have one) for your aftercare needs so that they can reassess your need for medications and monitor your lab values.  Discharge Instructions   Diet - low sodium heart healthy    Complete by:  As directed      Increase activity slowly    Complete by:  As directed             Medication List         acetaminophen 650 MG CR tablet  Commonly known as:  TYLENOL  Take 650 mg by mouth 2 (two) times daily.     calcium citrate 950 MG tablet  Commonly known as:  CALCITRATE - dosed in mg elemental calcium  Take 1 tablet by mouth daily.     carboxymethylcellulose 0.5 % Soln  Commonly known as:  REFRESH PLUS  Place 1 drop into both eyes 3 (three) times daily as needed. For dry eyes     cholecalciferol 1000 UNITS tablet  Commonly known as:  VITAMIN D  Take 1,000 Units by mouth 2 (two) times daily.     gabapentin 300 MG capsule  Commonly known as:  NEURONTIN  TAKE ONE CAPSULE BY MOUTH EVERY 8 HOURS AS NEEDED FOR NERVE PAIN     losartan 50 MG tablet  Commonly known as:  COZAAR  TAKE ONE TABLET BY MOUTH ONCE DAILY FOR BLOOD PRESSURE     methocarbamol 500 MG tablet  Commonly known as:  ROBAXIN  Take 1  tablet (500 mg total) by mouth every 6 (six) hours as needed for muscle spasms.     metoprolol tartrate 25 MG tablet  Commonly known as:  LOPRESSOR  Take 1 tablet (25 mg total) by mouth daily.     mirabegron ER 25 MG Tb24 tablet  Commonly known as:  MYRBETRIQ  Take 1 tablet (25 mg total) by mouth daily.     omeprazole 20 MG capsule  Commonly known as:  PRILOSEC  Take 20 mg by mouth daily.     traMADol 50 MG tablet  Commonly known as:  ULTRAM  1-2 tablets every 6 hours as needed for pain/max is 6 tablets in 24 hours     venlafaxine XR 37.5 MG 24 hr capsule  Commonly known as:  EFFEXOR-XR  Take 1 capsule (37.5 mg total) by mouth daily with breakfast.       Lovenox 40mg  subcutaneous daily for 2 weeks. Allergies  Allergen Reactions  . Codeine Nausea And Vomiting  . Dilaudid [Hydromorphone Hcl] Hives and Other (See Comments)    "whelps" (10/18/2012)  . Macrodantin Hives and Other (See Comments)    "drug fever; chills; felt terrible" (10/18/2012)  . Morphine And Related Hives and Nausea And Vomiting  . Sulfa Antibiotics Hives, Rash and Other (See Comments)    "got real sick" (10/18/2012)       Follow-up Information   Follow up with [facility]. (Someone from [facility] will contact you concerning start date and time for physical therapy.)    Contact information:   [address] [place] [address] [phone]        The results of significant diagnostics from this hospitalization (including imaging, microbiology, ancillary and laboratory) are listed below for reference.    Significant Diagnostic Studies: Dg Chest 1 View  04/07/2014   CLINICAL DATA:  Fall with right hip pain.  EXAM: CHEST - 1 VIEW  COMPARISON:  10/14/2012  FINDINGS: Normal heart size and upper mediastinal contours. There is cephalized blood flow related to supine state. No edema, effusion or visible pneumothorax. No evidence of fracture.  IMPRESSION: No active disease.    Electronically Signed   By: Jorje Guild M.D.   On: 04/07/2014 00:55   Dg Hip Complete Right  04/07/2014   CLINICAL DATA:  Right hip pain after fall.  EXAM: RIGHT HIP - COMPLETE 2+ VIEW  COMPARISON:  None.  FINDINGS: Status post right total hip arthroplasty. No acute fracture or dislocation is noted. Severe degenerative joint disease of the left hip joint is noted with associated deformity of left femoral head concerning for avascular necrosis.  IMPRESSION: Status post right total hip arthroplasty. No acute fracture dislocation is noted. Severe degenerative joint disease of left hip is noted.   Electronically Signed   By: Sabino Dick M.D.   On: 04/07/2014 00:52   Dg Femur Right  04/07/2014   CLINICAL DATA:  FALL  EXAM: RIGHT FEMUR - 2 VIEW  COMPARISON:  None.  FINDINGS: A right hip arthroplasties in place which is in gross anatomic alignment. No periprosthetic lucency to suggest loosening or infection. No acute fracture or dislocation.  Soft tissues within normal limits.  Degenerative spurring noted at the superior pole of patella. No joint effusion seen within the suprapatellar recess.  IMPRESSION: 1. No acute fracture or dislocation. 2. Right hip arthroplasty in place without evidence of complication.   Electronically Signed   By: Jeannine Boga M.D.   On: 04/07/2014 00:55   Dg Ankle Complete Left  04/07/2014   CLINICAL DATA:  Left ankle pain.  EXAM: LEFT ANKLE COMPLETE - 3+ VIEW  COMPARISON:  March 28, 2014.  FINDINGS: Talar dome appears intact. No significant soft tissue swelling is noted. Degenerative changes seen involving the posterior talus and navicular bone. No acute fracture or dislocation is noted.  IMPRESSION: No acute abnormality seen in left ankle.   Electronically Signed   By: Sabino Dick M.D.   On: 04/07/2014 00:56   Ct Head Wo Contrast  04/07/2014   CLINICAL DATA:  Fall with head injury.  EXAM: CT HEAD WITHOUT CONTRAST  CT CERVICAL SPINE WITHOUT CONTRAST  TECHNIQUE:  Multidetector CT imaging of the head and cervical spine was performed following the standard protocol without intravenous contrast. Multiplanar CT image reconstructions of the cervical spine were also generated.  COMPARISON:  None.  FINDINGS: CT HEAD FINDINGS  Skull and Sinuses:Negative for calvarial fracture.  There is mild scalp contusion in the right parietal region. There is a stable fracture lucency through the left nasal bone, first noted 10 days prior.  Orbits: No evidence of injury.  Bilateral cataract resection.  Brain: No evidence of acute abnormality, such as acute infarction, hemorrhage, hydrocephalus, or mass lesion/mass effect. There is age related generalized cerebral volume loss. Patchy bilateral deep and subcortical cerebral white matter low density consistent with chronic small vessel disease.  CT CERVICAL SPINE FINDINGS  No acute fracture identified. There is mild (approximately 2 mm) anterolisthesis at C5-6, C6-7, and C7-T1. These are associated with advanced facet osteoarthritis, the most likely cause. There is no subluxation of the facet joints. The C5-6 and C6-7 anterolisthesis was present on recent facial CT.  No gross cervical canal hematoma or prevertebral edema. Cervical degenerative changes are most notable in the facet joints, with ankylosis present at C2-3 and C3-4. There is ossified ligamentous buckling and hypertrophy posteriorly at C4-5 and C5-6, resulting in posterior CSF effacement. The bones are osteopenic. There is subpleural dependent lung opacity which is bilaterally symmetric, favoring scarring.  IMPRESSION: 1. No evidence of acute intracranial injury. Negative for cervical spine fracture. 2. Mid and lower cervical anterolisthesis associated with advanced facet osteoarthritis. 3. Senescent changes include brain atrophy and chronic small vessel disease.   Electronically Signed   By: Jorje Guild M.D.   On: 04/07/2014 02:33   Ct Cervical Spine Wo Contrast  04/07/2014    CLINICAL DATA:  Fall with head injury.  EXAM: CT HEAD WITHOUT CONTRAST  CT CERVICAL SPINE WITHOUT CONTRAST  TECHNIQUE: Multidetector CT imaging of the head and cervical spine was performed following the standard protocol without intravenous contrast. Multiplanar CT image reconstructions of the cervical spine were also generated.  COMPARISON:  None.  FINDINGS: CT HEAD FINDINGS  Skull and Sinuses:Negative for calvarial fracture. There is mild scalp contusion in the right parietal region. There is a stable fracture lucency through the left nasal bone, first noted 10 days prior.  Orbits: No evidence of injury.  Bilateral cataract resection.  Brain: No evidence of acute abnormality, such as acute infarction, hemorrhage, hydrocephalus, or mass lesion/mass effect. There is age related generalized cerebral volume loss. Patchy bilateral deep and subcortical cerebral white matter low density consistent with chronic small vessel disease.  CT CERVICAL SPINE FINDINGS  No acute fracture identified. There is mild (approximately 2 mm) anterolisthesis at C5-6, C6-7, and C7-T1. These are associated with advanced facet osteoarthritis, the most likely cause. There is no subluxation of the facet joints. The C5-6 and C6-7 anterolisthesis was present on recent facial CT.  No gross cervical canal hematoma or prevertebral edema. Cervical degenerative changes are most notable in the facet joints, with ankylosis present at C2-3 and C3-4. There is ossified ligamentous buckling and hypertrophy posteriorly at C4-5 and C5-6, resulting in posterior CSF effacement. The bones are osteopenic. There is subpleural dependent lung opacity which is bilaterally symmetric, favoring scarring.  IMPRESSION: 1. No evidence of acute intracranial injury. Negative for cervical spine fracture. 2. Mid and lower cervical anterolisthesis associated with advanced facet osteoarthritis. 3. Senescent changes include brain atrophy and chronic small vessel disease.    Electronically Signed   By: Jorje Guild M.D.   On: 04/07/2014 02:33   Ct Hip Right Wo Contrast  04/07/2014   CLINICAL DATA:  Fall with right hip pain  EXAM: CT OF THE RIGHT HIP WITHOUT CONTRAST  TECHNIQUE: Multidetector CT imaging was performed according to the standard protocol. Multiplanar  CT image reconstructions were also generated.  COMPARISON:  Right hip radiography from the same day  FINDINGS: Total right hip arthroplasty. There is an acute, comminuted periprosthetic fracture involving the greater trochanter, extending along the anterior and posterior aspect of the intertrochanteric femur, ending approximately 5 cm below the horizontal intertrochanteric portion of the implant. The fracture is essentially nondisplaced but there is an anterior fragment which is mildly distracted, but nondisplaced. There is mild muscular compartment edema around the fracture, without measurable hematoma. The acetabular cup is well seated and the prosthesis is located. The imaged bony pelvis is osteopenic but intact.  L4-5 discectomy. Advanced L5-S1 degenerative disc disease causing moderate spinal canal stenosis and bilateral foraminal stenosis.  No acute intrapelvic soft tissue findings.  IMPRESSION: Total right hip arthroplasty complicated by periprosthetic proximal femur fracture, as above.   Electronically Signed   By: Jorje Guild M.D.   On: 04/07/2014 07:01    Microbiology: No results found for this or any previous visit (from the past 240 hour(s)).   Labs: Basic Metabolic Panel:  Recent Labs Lab 04/06/14 2354 04/08/14 0514  NA 139 138  K 3.9 3.6*  CL 98 96  CO2 30 30  GLUCOSE 121* 111*  BUN 20 15  CREATININE 0.54 0.52  CALCIUM 9.5 8.9   Liver Function Tests:  Recent Labs Lab 04/07/14 1020  AST 14  ALT 13  ALKPHOS 120*  BILITOT 0.6  PROT 6.3  ALBUMIN 3.0*   No results found for this basename: LIPASE, AMYLASE,  in the last 168 hours No results found for this basename: AMMONIA,   in the last 168 hours CBC:  Recent Labs Lab 04/06/14 2354 04/08/14 0514  WBC 12.9* 10.6*  NEUTROABS 10.2*  --   HGB 11.8* 11.5*  HCT 36.1 34.4*  MCV 97.6 96.1  PLT 260 251   Cardiac Enzymes: No results found for this basename: CKTOTAL, CKMB, CKMBINDEX, TROPONINI,  in the last 168 hours BNP: BNP (last 3 results) No results found for this basename: PROBNP,  in the last 8760 hours CBG: No results found for this basename: GLUCAP,  in the last 168 hours     Signed:  Sheila Oats  [facility] 04/09/2014, 3:39 PM

## 2014-04-10 DIAGNOSIS — M810 Age-related osteoporosis without current pathological fracture: Secondary | ICD-10-CM | POA: Diagnosis not present

## 2014-04-10 DIAGNOSIS — Z96649 Presence of unspecified artificial hip joint: Secondary | ICD-10-CM | POA: Diagnosis not present

## 2014-04-10 DIAGNOSIS — F3289 Other specified depressive episodes: Secondary | ICD-10-CM | POA: Diagnosis not present

## 2014-04-10 DIAGNOSIS — IMO0001 Reserved for inherently not codable concepts without codable children: Secondary | ICD-10-CM | POA: Diagnosis not present

## 2014-04-10 DIAGNOSIS — Z9660 Presence of unspecified orthopedic joint implant: Secondary | ICD-10-CM | POA: Diagnosis not present

## 2014-04-10 DIAGNOSIS — Z9181 History of falling: Secondary | ICD-10-CM | POA: Diagnosis not present

## 2014-04-10 DIAGNOSIS — I10 Essential (primary) hypertension: Secondary | ICD-10-CM | POA: Diagnosis not present

## 2014-04-10 DIAGNOSIS — M1610 Unilateral primary osteoarthritis, unspecified hip: Secondary | ICD-10-CM | POA: Diagnosis not present

## 2014-04-10 DIAGNOSIS — F329 Major depressive disorder, single episode, unspecified: Secondary | ICD-10-CM | POA: Diagnosis not present

## 2014-04-10 DIAGNOSIS — T84049A Periprosthetic fracture around unspecified internal prosthetic joint, initial encounter: Secondary | ICD-10-CM | POA: Diagnosis not present

## 2014-04-10 NOTE — Telephone Encounter (Signed)
Message sent to Hassell Done, NP

## 2014-04-11 ENCOUNTER — Other Ambulatory Visit: Payer: Self-pay | Admitting: Nurse Practitioner

## 2014-04-11 ENCOUNTER — Telehealth: Payer: Self-pay | Admitting: *Deleted

## 2014-04-11 NOTE — Telephone Encounter (Signed)
Called for refill on medication samples(Amitiza) given in office. Daughter states that medication is working well, and would like to keep her on this if possible.

## 2014-04-12 ENCOUNTER — Telehealth: Payer: Self-pay

## 2014-04-12 ENCOUNTER — Other Ambulatory Visit: Payer: Self-pay | Admitting: *Deleted

## 2014-04-12 DIAGNOSIS — I10 Essential (primary) hypertension: Secondary | ICD-10-CM | POA: Diagnosis not present

## 2014-04-12 DIAGNOSIS — K5900 Constipation, unspecified: Secondary | ICD-10-CM

## 2014-04-12 DIAGNOSIS — T84049A Periprosthetic fracture around unspecified internal prosthetic joint, initial encounter: Secondary | ICD-10-CM | POA: Diagnosis not present

## 2014-04-12 DIAGNOSIS — F3289 Other specified depressive episodes: Secondary | ICD-10-CM | POA: Diagnosis not present

## 2014-04-12 DIAGNOSIS — M810 Age-related osteoporosis without current pathological fracture: Secondary | ICD-10-CM | POA: Diagnosis not present

## 2014-04-12 DIAGNOSIS — IMO0001 Reserved for inherently not codable concepts without codable children: Secondary | ICD-10-CM | POA: Diagnosis not present

## 2014-04-12 DIAGNOSIS — M1610 Unilateral primary osteoarthritis, unspecified hip: Secondary | ICD-10-CM | POA: Diagnosis not present

## 2014-04-12 DIAGNOSIS — F329 Major depressive disorder, single episode, unspecified: Secondary | ICD-10-CM | POA: Diagnosis not present

## 2014-04-12 MED ORDER — LUBIPROSTONE 24 MCG PO CAPS: 24.0000 ug | ORAL_CAPSULE | Freq: Two times a day (BID) | ORAL | Status: DC

## 2014-04-12 NOTE — Telephone Encounter (Signed)
I spoke with [name] (patient's daughter), patient with pending appointment on 04/23/14 to see Orthopedic Specialist. Patient was told she needs to be healed x 6 weeks prior to having surgery on left hip that was originally scheduled for 05/08/2014. As of right now patient not sure when surgery will be rescheduled. Patient's daughter will follow-up with [place] after appointment on 04/23/14, at that time patient will schedule appointment for Pre-Op clearance based on rescheduled date given for surgery.   Per Op paperwork was placed in yellow "Forms" folder in triage area until further notice

## 2014-04-12 NOTE — Telephone Encounter (Signed)
Note already opened. This was opened in error

## 2014-04-12 NOTE — Telephone Encounter (Signed)
Message copied by Logan Bores on Thu Apr 12, 2014 12:48 PM ------      Message from: Pricilla Larsson      Created: Thu Apr 12, 2014 12:27 PM      Regarding: needs appt       Pt has had recent hospitalization and needs surgery clearance for hip replacement please call daughter and have her make a follow up hospitalization and appt for surgery clearance thanks  ------

## 2014-04-13 DIAGNOSIS — M810 Age-related osteoporosis without current pathological fracture: Secondary | ICD-10-CM | POA: Diagnosis not present

## 2014-04-13 DIAGNOSIS — F329 Major depressive disorder, single episode, unspecified: Secondary | ICD-10-CM | POA: Diagnosis not present

## 2014-04-13 DIAGNOSIS — I10 Essential (primary) hypertension: Secondary | ICD-10-CM | POA: Diagnosis not present

## 2014-04-13 DIAGNOSIS — F3289 Other specified depressive episodes: Secondary | ICD-10-CM | POA: Diagnosis not present

## 2014-04-13 DIAGNOSIS — M1610 Unilateral primary osteoarthritis, unspecified hip: Secondary | ICD-10-CM | POA: Diagnosis not present

## 2014-04-13 DIAGNOSIS — T84049A Periprosthetic fracture around unspecified internal prosthetic joint, initial encounter: Secondary | ICD-10-CM | POA: Diagnosis not present

## 2014-04-13 DIAGNOSIS — IMO0001 Reserved for inherently not codable concepts without codable children: Secondary | ICD-10-CM | POA: Diagnosis not present

## 2014-04-16 ENCOUNTER — Other Ambulatory Visit: Payer: Self-pay | Admitting: Nurse Practitioner

## 2014-04-16 DIAGNOSIS — M810 Age-related osteoporosis without current pathological fracture: Secondary | ICD-10-CM | POA: Diagnosis not present

## 2014-04-16 DIAGNOSIS — M1610 Unilateral primary osteoarthritis, unspecified hip: Secondary | ICD-10-CM | POA: Diagnosis not present

## 2014-04-16 DIAGNOSIS — F329 Major depressive disorder, single episode, unspecified: Secondary | ICD-10-CM | POA: Diagnosis not present

## 2014-04-16 DIAGNOSIS — F3289 Other specified depressive episodes: Secondary | ICD-10-CM | POA: Diagnosis not present

## 2014-04-16 DIAGNOSIS — T84049A Periprosthetic fracture around unspecified internal prosthetic joint, initial encounter: Secondary | ICD-10-CM | POA: Diagnosis not present

## 2014-04-16 DIAGNOSIS — IMO0001 Reserved for inherently not codable concepts without codable children: Secondary | ICD-10-CM | POA: Diagnosis not present

## 2014-04-16 DIAGNOSIS — I10 Essential (primary) hypertension: Secondary | ICD-10-CM | POA: Diagnosis not present

## 2014-04-18 ENCOUNTER — Telehealth: Payer: Self-pay

## 2014-04-18 DIAGNOSIS — M810 Age-related osteoporosis without current pathological fracture: Secondary | ICD-10-CM | POA: Diagnosis not present

## 2014-04-18 DIAGNOSIS — I10 Essential (primary) hypertension: Secondary | ICD-10-CM | POA: Diagnosis not present

## 2014-04-18 DIAGNOSIS — F329 Major depressive disorder, single episode, unspecified: Secondary | ICD-10-CM | POA: Diagnosis not present

## 2014-04-18 DIAGNOSIS — IMO0001 Reserved for inherently not codable concepts without codable children: Secondary | ICD-10-CM | POA: Diagnosis not present

## 2014-04-18 DIAGNOSIS — Z9660 Presence of unspecified orthopedic joint implant: Secondary | ICD-10-CM | POA: Diagnosis not present

## 2014-04-18 DIAGNOSIS — M1610 Unilateral primary osteoarthritis, unspecified hip: Secondary | ICD-10-CM | POA: Diagnosis not present

## 2014-04-18 DIAGNOSIS — T84049A Periprosthetic fracture around unspecified internal prosthetic joint, initial encounter: Secondary | ICD-10-CM | POA: Diagnosis not present

## 2014-04-18 DIAGNOSIS — F3289 Other specified depressive episodes: Secondary | ICD-10-CM | POA: Diagnosis not present

## 2014-04-18 MED ORDER — METHOCARBAMOL 500 MG PO TABS: 500.0000 mg | ORAL_TABLET | Freq: Four times a day (QID) | ORAL | Status: DC | PRN

## 2014-04-18 NOTE — Telephone Encounter (Signed)
RX sent electronically 

## 2014-04-18 NOTE — Telephone Encounter (Signed)
Fax was received, patient received Robaxin in the hospital #[id number]. Patient is now requesting pcp to prescribed. Janett Billow please advise on dispense number and if ok to give refills, patient is taking 1 by mouth every 6 hours as needed for muscle spasms.

## 2014-04-18 NOTE — Telephone Encounter (Signed)
That is fine, may give a 1 months supple with 3 refills

## 2014-04-20 DIAGNOSIS — I10 Essential (primary) hypertension: Secondary | ICD-10-CM | POA: Diagnosis not present

## 2014-04-20 DIAGNOSIS — M810 Age-related osteoporosis without current pathological fracture: Secondary | ICD-10-CM | POA: Diagnosis not present

## 2014-04-20 DIAGNOSIS — IMO0001 Reserved for inherently not codable concepts without codable children: Secondary | ICD-10-CM | POA: Diagnosis not present

## 2014-04-20 DIAGNOSIS — T84049A Periprosthetic fracture around unspecified internal prosthetic joint, initial encounter: Secondary | ICD-10-CM | POA: Diagnosis not present

## 2014-04-20 DIAGNOSIS — Z9660 Presence of unspecified orthopedic joint implant: Secondary | ICD-10-CM | POA: Diagnosis not present

## 2014-04-20 DIAGNOSIS — F329 Major depressive disorder, single episode, unspecified: Secondary | ICD-10-CM | POA: Diagnosis not present

## 2014-04-20 DIAGNOSIS — M1610 Unilateral primary osteoarthritis, unspecified hip: Secondary | ICD-10-CM | POA: Diagnosis not present

## 2014-04-20 DIAGNOSIS — F3289 Other specified depressive episodes: Secondary | ICD-10-CM | POA: Diagnosis not present

## 2014-04-21 ENCOUNTER — Other Ambulatory Visit: Payer: Self-pay | Admitting: Orthopedic Surgery

## 2014-04-21 ENCOUNTER — Other Ambulatory Visit: Payer: Self-pay | Admitting: Nurse Practitioner

## 2014-04-22 ENCOUNTER — Other Ambulatory Visit: Payer: Self-pay | Admitting: Nurse Practitioner

## 2014-04-23 DIAGNOSIS — M1610 Unilateral primary osteoarthritis, unspecified hip: Secondary | ICD-10-CM | POA: Diagnosis not present

## 2014-04-23 DIAGNOSIS — M25559 Pain in unspecified hip: Secondary | ICD-10-CM | POA: Diagnosis not present

## 2014-04-27 ENCOUNTER — Encounter (HOSPITAL_COMMUNITY): Payer: Self-pay

## 2014-04-27 ENCOUNTER — Inpatient Hospital Stay (HOSPITAL_COMMUNITY)
Admission: RE | Admit: 2014-04-27 | Discharge: 2014-04-27 | Disposition: A | Discharge status: Home / Self Care | Payer: Medicare Other | Source: Ambulatory Visit

## 2014-04-27 HISTORY — DX: Other muscle spasm: M62.838

## 2014-04-27 HISTORY — DX: Overactive bladder: N32.81

## 2014-04-27 NOTE — Pre-Procedure Instructions (Signed)
Denise Lane  04/27/2014   Your procedure is scheduled on:  Tues, July 28 @ 10:20 AM  Report to [facility]  at 8:15 AM.  Call this number if you have problems the morning of surgery: [phone]   Remember:   Do not eat food or drink liquids after midnight.   Take these medicines the morning of surgery with A SIP OF WATER: Gabapentin(Neurontin),Amitiza(Lubiprostone),Metoprolol(Lopressor),Myrbetriq(Mirabegron),Omeprazole(Prilosec),Pain Pill(if needed),and Effexor(Venlafaxine)              Stop taking your Mobic. No Goody's,BC's,Aleve,Aspirin,Ibuprofen,Fish Oil,or any Herbal Medications   Do not wear jewelry, make-up or nail polish.  Do not wear lotions, powders, or perfumes. You may wear deodorant.  Do not shave 48 hours prior to surgery.   Do not bring valuables to the hospital.  [facility] is not responsible                  for any belongings or valuables.               Contacts, dentures or bridgework may not be worn into surgery.  Leave suitcase in the car. After surgery it may be brought to your room.  For patients admitted to the hospital, discharge time is determined by your                treatment team.               Special Instructions:  [facility] - Preparing for Surgery  Before surgery, you can play an important role.  Because skin is not sterile, your skin needs to be as free of germs as possible.  You can reduce the number of germs on you skin by washing with CHG (chlorahexidine gluconate) soap before surgery.  CHG is an antiseptic cleaner which kills germs and bonds with the skin to continue killing germs even after washing.  Please DO NOT use if you have an allergy to CHG or antibacterial soaps.  If your skin becomes reddened/irritated stop using the CHG and inform your nurse when you arrive at Short Stay.  Do not shave (including legs and underarms) for at least 48 hours prior to the first CHG shower.  You may shave your face.  Please follow these  instructions carefully:   1.  Shower with CHG Soap the night before surgery and the                                morning of Surgery.  2.  If you choose to wash your hair, wash your hair first as usual with your       normal shampoo.  3.  After you shampoo, rinse your hair and body thoroughly to remove the                      Shampoo.  4.  Use CHG as you would any other liquid soap.  You can apply chg directly       to the skin and wash gently with scrungie or a clean washcloth.  5.  Apply the CHG Soap to your body ONLY FROM THE NECK DOWN.        Do not use on open wounds or open sores.  Avoid contact with your eyes,       ears, mouth and genitals (private parts).  Wash genitals (private parts)       with  your normal soap.  6.  Wash thoroughly, paying special attention to the area where your surgery        will be performed.  7.  Thoroughly rinse your body with warm water from the neck down.  8.  DO NOT shower/wash with your normal soap after using and rinsing off       the CHG Soap.  9.  Pat yourself dry with a clean towel.            10.  Wear clean pajamas.            11.  Place clean sheets on your bed the night of your first shower and do not        sleep with pets.  Day of Surgery  Do not apply any lotions/deoderants the morning of surgery.  Please wear clean clothes to the hospital/surgery center.     Please read over the following fact sheets that you were given: Pain Booklet, Coughing and Deep Breathing, Blood Transfusion Information, MRSA Information and Surgical Site Infection Prevention

## 2014-05-01 ENCOUNTER — Other Ambulatory Visit: Payer: Self-pay

## 2014-05-01 ENCOUNTER — Encounter: Payer: Self-pay | Admitting: Nurse Practitioner

## 2014-05-01 DIAGNOSIS — K5900 Constipation, unspecified: Secondary | ICD-10-CM

## 2014-05-01 MED ORDER — LUBIPROSTONE 24 MCG PO CAPS: 24.0000 ug | ORAL_CAPSULE | Freq: Two times a day (BID) | ORAL | Status: DC

## 2014-05-01 NOTE — Telephone Encounter (Signed)
Refer to Mychart message sent on 05/01/2014

## 2014-05-08 ENCOUNTER — Encounter (HOSPITAL_COMMUNITY): Admission: RE | Payer: Self-pay | Source: Ambulatory Visit

## 2014-05-08 ENCOUNTER — Inpatient Hospital Stay (HOSPITAL_COMMUNITY): Admission: RE | Admit: 2014-05-08 | Payer: Medicare Other | Source: Ambulatory Visit | Admitting: Orthopedic Surgery

## 2014-05-08 SURGERY — ARTHROPLASTY, HIP, TOTAL,POSTERIOR APPROACH
Anesthesia: General | Laterality: Left

## 2014-05-17 ENCOUNTER — Other Ambulatory Visit: Payer: Self-pay | Admitting: Nurse Practitioner

## 2014-05-22 DIAGNOSIS — M1610 Unilateral primary osteoarthritis, unspecified hip: Secondary | ICD-10-CM | POA: Diagnosis not present

## 2014-06-04 ENCOUNTER — Other Ambulatory Visit: Payer: Self-pay | Admitting: Nurse Practitioner

## 2014-06-04 DIAGNOSIS — N3281 Overactive bladder: Secondary | ICD-10-CM

## 2014-06-04 MED ORDER — MIRABEGRON ER 25 MG PO TB24: 25.0000 mg | ORAL_TABLET | Freq: Two times a day (BID) | ORAL | Status: DC

## 2014-06-04 NOTE — Telephone Encounter (Signed)
Spoke with patient's daughter, patient would rather have 25 mg bid vs 50 mg once daily. 30 day supply requested @ a time. RX sent

## 2014-06-07 ENCOUNTER — Other Ambulatory Visit: Payer: Self-pay | Admitting: Orthopedic Surgery

## 2014-06-13 ENCOUNTER — Telehealth: Payer: Self-pay | Admitting: *Deleted

## 2014-06-13 NOTE — Telephone Encounter (Signed)
Daughter, [name], called and stated that Dr. Mardelle Matte office had a cancellation for tomorrow and can do the surgery tomorrow with the form being signed by [place]. I spoke with Dr. Bubba Camp because Janett Billow is out on vacation and Dr. Bubba Camp can not fill out the form without seeing the patient first. I called daughter and informed her and she agreed. Called Sherry back at Dr. Mardelle Matte office and requested the form again because we have not received it yet. # [id number]

## 2014-06-13 NOTE — Telephone Encounter (Signed)
Sherry with Dr. Mardelle Matte office called wanting a preop clearance for patient for total hip replacement. She will fax the form to [place] to fill out.

## 2014-06-14 ENCOUNTER — Other Ambulatory Visit (HOSPITAL_COMMUNITY): Payer: Medicare Other

## 2014-06-19 ENCOUNTER — Other Ambulatory Visit: Payer: Self-pay | Admitting: *Deleted

## 2014-06-19 ENCOUNTER — Encounter: Payer: Self-pay | Admitting: Nurse Practitioner

## 2014-06-19 DIAGNOSIS — N3281 Overactive bladder: Secondary | ICD-10-CM

## 2014-06-19 MED ORDER — MIRABEGRON ER 25 MG PO TB24: 25.0000 mg | ORAL_TABLET | Freq: Two times a day (BID) | ORAL | Status: DC

## 2014-06-21 ENCOUNTER — Other Ambulatory Visit: Payer: Self-pay | Admitting: *Deleted

## 2014-06-21 ENCOUNTER — Encounter: Payer: Self-pay | Admitting: Nurse Practitioner

## 2014-06-21 DIAGNOSIS — N3281 Overactive bladder: Secondary | ICD-10-CM

## 2014-06-21 MED ORDER — MIRABEGRON ER 25 MG PO TB24: 25.0000 mg | ORAL_TABLET | Freq: Two times a day (BID) | ORAL | Status: DC

## 2014-06-21 NOTE — Telephone Encounter (Signed)
Per Hassell Done, Patient needs an appointment for surgery clearance. Dione Housekeeper, daughter, # [phone] and left message that patient needed an appointment and that I would call in Rx to pharmacy.

## 2014-06-21 NOTE — Telephone Encounter (Signed)
Patient daughter, Denise Lane requested Rx to be faxed to pharmacy

## 2014-06-23 DIAGNOSIS — Z23 Encounter for immunization: Secondary | ICD-10-CM | POA: Diagnosis not present

## 2014-07-12 ENCOUNTER — Ambulatory Visit (INDEPENDENT_AMBULATORY_CARE_PROVIDER_SITE_OTHER): Payer: Medicare Other | Admitting: Nurse Practitioner

## 2014-07-12 ENCOUNTER — Encounter: Payer: Self-pay | Admitting: Nurse Practitioner

## 2014-07-12 VITALS — BP 130/72 | HR 69 | Temp 98.2°F | Resp 18 | Ht 66.0 in | Wt 118.6 lb

## 2014-07-12 DIAGNOSIS — M1612 Unilateral primary osteoarthritis, left hip: Secondary | ICD-10-CM

## 2014-07-12 DIAGNOSIS — Z01812 Encounter for preprocedural laboratory examination: Secondary | ICD-10-CM | POA: Diagnosis not present

## 2014-07-12 DIAGNOSIS — Z01818 Encounter for other preprocedural examination: Secondary | ICD-10-CM

## 2014-07-12 NOTE — Patient Instructions (Signed)
Will get blood work today and then will send form in for surgery clearance

## 2014-07-12 NOTE — Progress Notes (Signed)
Patient ID: Denise Lane, female   DOB: 10/22/1929, 80 y.o.   MRN: [id number]    Allergies  Allergen Reactions  . Codeine Nausea And Vomiting  . Dilaudid [Hydromorphone Hcl] Hives and Other (See Comments)    "whelps" (10/18/2012)  . Macrodantin Hives and Other (See Comments)    "drug fever; chills; felt terrible" (10/18/2012)  . Morphine And Related Hives and Nausea And Vomiting  . Sulfa Antibiotics Hives, Rash and Other (See Comments)    "got real sick" (10/18/2012)    Chief Complaint  Patient presents with  . Medical Management of Chronic Issues    HPI: Patient is a 80 y.o. female seen in the office today for pre-op evaluation and clearance for left total hip replacement. pt failed conservative treatment now scheduled for surgery on October 13th 2015.  Pt with hx of hemochromatosis, constipation, hyperlipidemia, overactive bladder, depression, OA and chronic pain. Pain medication not helping pain. Limited mobility. Increased risk of falls due to pain. Reports worsening LE edema since pain and arthritis has gotten worse.  No complications with anesthesia in the past except for nausea  No chest pains, shortness of breath (using IS to prepare for surgery)  Already has had flu shot.  Review of Systems:  Review of Systems  Constitutional: Negative for fever, chills, weight loss and malaise/fatigue.  HENT: Negative for congestion, ear discharge, ear pain, nosebleeds and sore throat.   Eyes: Negative for blurred vision.  Respiratory: Negative for cough, shortness of breath and wheezing.   Cardiovascular: Positive for leg swelling. Negative for chest pain, palpitations and claudication.  Gastrointestinal: Positive for constipation (improved on medications). Negative for heartburn (none since she has been on prilosec), nausea, vomiting, diarrhea, blood in stool and melena.  Genitourinary: Positive for frequency (with urinary incontience). Negative for dysuria, urgency and hematuria.    Musculoskeletal: Positive for back pain and joint pain (left hip and back). Negative for falls and myalgias.  Neurological: Positive for weakness. Negative for dizziness and headaches.  Psychiatric/Behavioral: Negative for memory loss. The patient does not have insomnia.        Anxiety and depressive symptoms have improved since on Effexor      Past Medical History  Diagnosis Date  . Hemochromatosis 1987  . Stenosis of esophagus   . Dysphagia   . Muscle pain   . History of colonic polyps     NOTED 11-20-09 IN COLONOSCOPY REPORT  . Diverticulosis of sigmoid colon 11-20-2009  . Hemorrhoids, internal 11-20-09  . Hyperlipidemia     no meds required  . Tremor, essential 10-29-09  . Hemochromatosis   . PONV (postoperative nausea and vomiting)   . History of bronchitis 1991-1996    "chronic; related to winter" (10/18/2012)  . History of blood transfusion     "S/P colonoscopy after polyp removed; got 2 units; esophageal bleed got 1 unit; really messed up hemochromatosis" (1/7/20214)  . Occasional tremors   . Joint pain   . Joint swelling   . Bruises easily   . H/O hiatal hernia   . Gastric ulcer   . Vomiting     occasionally  . Urinary urgency     takes Ditropan daily  . Osteoporosis   . Dry eyes     uses Refresh eye drops daily as needed  . Rheumatic fever     hx of  . Osteoarthritis   . Osteoarthritis of right hip 10/18/2012  . Chronic lower back pain   . Basal cell cancer     "  face, legs" (10/18/2012)  . Squamous carcinoma     "LLE" (10/18/2012)  . Hypertension     takes Metoprolol and Losartan daily  . Depression     takes Effexor daily  . GERD (gastroesophageal reflux disease)     takes Omeprazole daily  . Overactive bladder     takes Myrbetriq daily  . Muscle spasm     takes Robaxin daily as needed   Past Surgical History  Procedure Laterality Date  . Tonsillectomy  1953  . Appendectomy  1952  . Cholecystectomy  1977  . Tubal ligation  1975  . Lumbar disc surgery   1989  . Shoulder arthroscopy w/ rotator cuff repair  1998?    "left" (10/18/2012)  . Dilation and curettage of uterus  1975  . Cataract extraction w/ intraocular lens  implant, bilateral  2001    "both eyes" (10/18/2012)  . Esophagogastroduodenoscopy    . Colonoscopy    . Total hip arthroplasty  10/18/2012    "right" (10/18/2012)  . Skin cancer excision      "multiple" (10/18/2012)  . Total hip arthroplasty  10/18/2012    Procedure: TOTAL HIP ARTHROPLASTY;  Surgeon: Johnny Bridge, MD;  Location: [place];  Service: Orthopedics;  Laterality: Right;   Social History:   reports that she has never smoked. She has never used smokeless tobacco. She reports that she does not drink alcohol or use illicit drugs.  Family History  Problem Relation Age of Onset  . Hyperlipidemia Daughter     Medications: Patient's Medications  New Prescriptions   No medications on file  Previous Medications   ACETAMINOPHEN (TYLENOL) 650 MG CR TABLET    Take 650 mg by mouth 2 (two) times daily.    CALCIUM CITRATE (CALCITRATE - DOSED IN MG ELEMENTAL CALCIUM) 950 MG TABLET    Take 1 tablet by mouth daily.   CARBOXYMETHYLCELLULOSE (REFRESH PLUS) 0.5 % SOLN    Place 1 drop into both eyes 3 (three) times daily as needed. For dry eyes   CHOLECALCIFEROL (VITAMIN D) 1000 UNITS TABLET    Take 1,000 Units by mouth 2 (two) times daily.   GABAPENTIN (NEURONTIN) 300 MG CAPSULE    TAKE ONE CAPSULE BY MOUTH EVERY 8 HOURS AS NEEDED FOR  NERVE  PAIN   LOSARTAN (COZAAR) 50 MG TABLET    TAKE ONE TABLET BY MOUTH ONCE DAILY FOR BLOOD PRESSURE   LUBIPROSTONE (AMITIZA) 24 MCG CAPSULE    Take 1 capsule (24 mcg total) by mouth 2 (two) times daily with a meal.   METHOCARBAMOL (ROBAXIN) 500 MG TABLET    Take 1 tablet (500 mg total) by mouth every 6 (six) hours as needed for muscle spasms.   METOPROLOL TARTRATE (LOPRESSOR) 25 MG TABLET    Take 1 tablet (25 mg total) by mouth daily.   METOPROLOL TARTRATE (LOPRESSOR) 25 MG TABLET    TAKE ONE TABLET BY  MOUTH ONCE DAILY   MIRABEGRON ER (MYRBETRIQ) 25 MG TB24 TABLET    Take 1 tablet (25 mg total) by mouth 2 (two) times daily.   OMEPRAZOLE (PRILOSEC) 20 MG CAPSULE    Take 20 mg by mouth daily.   OXYCODONE (OXY IR/ROXICODONE) 5 MG IMMEDIATE RELEASE TABLET       TRAMADOL (ULTRAM) 50 MG TABLET    1-2 tablets every 6 hours as needed for pain/max is 6 tablets in 24 hours   VENLAFAXINE XR (EFFEXOR-XR) 37.5 MG 24 HR CAPSULE    TAKE ONE CAPSULE BY  MOUTH ONCE DAILY SWITH  BREAKFAST  Modified Medications   No medications on file  Discontinued Medications   ENOXAPARIN (LOVENOX) 40 MG/0.4ML INJECTION    Inject 0.4 mLs (40 mg total) into the skin daily.   MELOXICAM (MOBIC) 7.5 MG TABLET         Physical Exam:  Filed Vitals:   07/12/14 1453  BP: 130/72  Pulse: 69  Temp: 98.2 F (36.8 C)  TempSrc: Oral  Resp: 18  Height: 5\' 6"  (1.676 m)  Weight: 118 lb 9.6 oz (53.797 kg)  SpO2: 98%    Physical Exam  Constitutional: She is oriented to person, place, and time. She appears well-developed and well-nourished.  HENT:  Head: Normocephalic and atraumatic.  Mouth/Throat: Oropharynx is clear and moist. No oropharyngeal exudate.  Eyes: Conjunctivae and EOM are normal. Pupils are equal, round, and reactive to light. Right eye exhibits no discharge. Left eye exhibits no discharge.  Neck: Normal range of motion. Neck supple. No tracheal deviation present.  Cardiovascular: Normal rate, regular rhythm, normal heart sounds and intact distal pulses.   Pulmonary/Chest: Effort normal and breath sounds normal.  Abdominal: Soft. Bowel sounds are normal.  Musculoskeletal: She exhibits edema (2+ bilaterally).  Limited ROM to bilateral hips  Lymphadenopathy:    She has no cervical adenopathy.  Neurological: She is alert and oriented to person, place, and time.  Skin: Skin is warm and dry. No rash noted. No erythema.  Psychiatric: She has a normal mood and affect. Her behavior is normal. Thought content normal.       Labs reviewed: Basic Metabolic Panel:  Recent Labs  02/08/14 1128 02/12/14 0956 04/06/14 2354 04/08/14 0514  NA 137 141 139 138  K 4.2 4.5 3.9 3.6*  CL 95*  --  98 96  CO2 28 31* 30 30  GLUCOSE 97 109 121* 111*  BUN 32* 18.6 20 15   CREATININE 0.71 0.7 0.54 0.52  CALCIUM 10.0 10.3 9.5 8.9  TSH 1.800  --   --   --    Liver Function Tests:  Recent Labs  10/23/13 1221 02/08/14 1128 02/12/14 0956 04/07/14 1020  AST 17 17 16 14   ALT 10 9 8 13   ALKPHOS 75 83 84 120*  BILITOT 0.60 0.5 0.63 0.6  PROT 6.6 6.3 6.4 6.3  ALBUMIN 3.7  --  3.6 3.0*   No results found for this basename: LIPASE, AMYLASE,  in the last 8760 hours No results found for this basename: AMMONIA,  in the last 8760 hours CBC:  Recent Labs  02/08/14 1128 02/12/14 0956 04/06/14 2354 04/08/14 0514  WBC 8.0 8.0 12.9* 10.6*  NEUTROABS 4.8 4.9 10.2*  --   HGB 12.1 12.5 11.8* 11.5*  HCT 34.9 36.7 36.1 34.4*  MCV 92 96.3 97.6 96.1  PLT 299 281 260 251   Lipid Panel: No results found for this basename: CHOL, HDL, LDLCALC, TRIG, CHOLHDL, LDLDIRECT,  in the last 8760 hours TSH:  Recent Labs  02/08/14 1128  TSH 1.800   A1C: No results found for this basename: HGBA1C     Assessment/Plan 1. Primary osteoarthritis of left hip -scheduled for total hip on  2. Preop examination Pt without any angina, dyspnea, syncope, and palpitations and does not have a history of ischemic, valvular, or myopathic disease, chronic kidney disease, cerebrovascular or peripheral artery disease. However does have hypertension that is well controlled on medications. Pt also with increased age so a risk is involved however she as done well and tolerated  surgery in the past. No intolerances to anesthesia other than nausea but has been well managed in the past.  Pending lab work pt will be granted surgical clearance  - Comprehensive metabolic panel - CBC With differential/Platelet  -EKG showing NSR, will fwd to ortho as  well

## 2014-07-13 ENCOUNTER — Encounter (HOSPITAL_COMMUNITY): Payer: Self-pay | Admitting: Pharmacy Technician

## 2014-07-13 LAB — COMPREHENSIVE METABOLIC PANEL
ALBUMIN: 4 g/dL (ref 3.5–4.7)
ALT: 8 IU/L (ref 0–32)
AST: 13 IU/L (ref 0–40)
Albumin/Globulin Ratio: 1.7 (ref 1.1–2.5)
Alkaline Phosphatase: 113 IU/L (ref 39–117)
BUN/Creatinine Ratio: 31 — ABNORMAL HIGH (ref 11–26)
BUN: 20 mg/dL (ref 8–27)
CALCIUM: 9.8 mg/dL (ref 8.7–10.3)
CO2: 30 mmol/L — ABNORMAL HIGH (ref 18–29)
CREATININE: 0.65 mg/dL (ref 0.57–1.00)
Chloride: 99 mmol/L (ref 97–108)
GFR calc Af Amer: 96 mL/min/{1.73_m2} (ref >59)
GFR, EST NON AFRICAN AMERICAN: 83 mL/min/{1.73_m2} (ref >59)
GLOBULIN, TOTAL: 2.3 g/dL (ref 1.5–4.5)
Glucose: 101 mg/dL — ABNORMAL HIGH (ref 65–99)
Potassium: 4.3 mmol/L (ref 3.5–5.2)
Sodium: 145 mmol/L — ABNORMAL HIGH (ref 134–144)
Total Bilirubin: 0.3 mg/dL (ref 0.0–1.2)
Total Protein: 6.3 g/dL (ref 6.0–8.5)

## 2014-07-13 LAB — CBC WITH DIFFERENTIAL
Basophils Absolute: 0 10*3/uL (ref 0.0–0.2)
Basos: 0 %
EOS ABS: 0.1 10*3/uL (ref 0.0–0.4)
Eos: 1 %
HCT: 36.7 % (ref 34.0–46.6)
HEMOGLOBIN: 12.4 g/dL (ref 11.1–15.9)
Immature Grans (Abs): 0 10*3/uL (ref 0.0–0.1)
Immature Granulocytes: 0 %
LYMPHS ABS: 2.4 10*3/uL (ref 0.7–3.1)
Lymphs: 33 %
MCH: 32 pg (ref 26.6–33.0)
MCHC: 33.8 g/dL (ref 31.5–35.7)
MCV: 95 fL (ref 79–97)
Monocytes Absolute: 0.8 10*3/uL (ref 0.1–0.9)
Monocytes: 11 %
Neutrophils Absolute: 4.1 10*3/uL (ref 1.4–7.0)
Neutrophils Relative %: 55 %
Platelets: 280 10*3/uL (ref 150–379)
RBC: 3.87 x10E6/uL (ref 3.77–5.28)
RDW: 13.5 % (ref 12.3–15.4)
WBC: 7.4 10*3/uL (ref 3.4–10.8)

## 2014-07-17 ENCOUNTER — Other Ambulatory Visit (HOSPITAL_COMMUNITY): Payer: Self-pay | Admitting: *Deleted

## 2014-07-17 NOTE — Pre-Procedure Instructions (Signed)
Elexa Kivi Penner  07/17/2014   Your procedure is scheduled on:  Tuesday, July 24, 2014 at 10:12 AM.   Report to [facility] Entrance "A" Admitting Office at 8:10 AM.   Call this number if you have problems the morning of surgery: [phone]   Remember:   Do not eat food or drink liquids after midnight Monday, 07/23/14.   Take these medicines the morning of surgery with A SIP OF WATER: gabapentin (NEURONTIN), metoprolol tartrate (LOPRESSOR), omeprazole (PRILOSEC), oxyCODONE (OXY IR/ROXICODONE) or traMADol (ULTRAM) - if needed   Do not wear jewelry, make-up or nail polish.  Do not wear lotions, powders, or perfumes. You may wear deodorant.  Do not shave 48 hours prior to surgery.   Do not bring valuables to the hospital.  [facility] is not responsible    for any belongings or valuables.               Contacts, dentures or bridgework may not be worn into surgery.  Leave suitcase in the car. After surgery it may be brought to your room.  For patients admitted to the hospital, discharge time is determined by your    treatment team.            Special Instructions: [place] - Preparing for Surgery  Before surgery, you can play an important role.  Because skin is not sterile, your skin needs to be as free of germs as possible.  You can reduce the number of germs on you skin by washing with CHG (chlorahexidine gluconate) soap before surgery.  CHG is an antiseptic cleaner which kills germs and bonds with the skin to continue killing germs even after washing.  Please DO NOT use if you have an allergy to CHG or antibacterial soaps.  If your skin becomes reddened/irritated stop using the CHG and inform your nurse when you arrive at Short Stay.  Do not shave (including legs and underarms) for at least 48 hours prior to the first CHG shower.  You may shave your face.  Please follow these instructions carefully:   1.  Shower with CHG Soap the night before surgery and the  morning  of Surgery.  2.  If you choose to wash your hair, wash your hair first as usual with your  normal shampoo.  3.  After you shampoo, rinse your hair and body thoroughly to remove the   Shampoo.  4.  Use CHG as you would any other liquid soap.  You can apply chg directly  to the skin and wash gently with scrungie or a clean washcloth.  5.  Apply the CHG Soap to your body ONLY FROM THE NECK DOWN.  Do not use on open wounds or open sores.  Avoid contact with your eyes, ears, mouth and genitals (private parts).  Wash genitals (private parts) with your normal soap.  6.  Wash thoroughly, paying special attention to the area where your surgery   will be performed.  7.  Thoroughly rinse your body with warm water from the neck down.  8.  DO NOT shower/wash with your normal soap after using and rinsing off   the CHG Soap.  9.  Pat yourself dry with a clean towel.            10.  Wear clean pajamas.            11.  Place clean sheets on your bed the night of your first shower and do not  sleep with pets.  Day of Surgery  Do not apply any lotions the morning of surgery.  Please wear clean clothes to the hospital.     Please read over the following fact sheets that you were given: Pain Booklet, Coughing and Deep Breathing, Blood Transfusion Information and Surgical Site Infection Prevention

## 2014-07-18 ENCOUNTER — Encounter (HOSPITAL_COMMUNITY)
Admission: RE | Admit: 2014-07-18 | Discharge: 2014-07-18 | Disposition: A | Discharge status: Home / Self Care | Payer: Medicare Other | Source: Ambulatory Visit | Attending: Orthopedic Surgery | Admitting: Orthopedic Surgery

## 2014-07-18 ENCOUNTER — Encounter (HOSPITAL_COMMUNITY): Payer: Self-pay

## 2014-07-18 ENCOUNTER — Encounter (HOSPITAL_COMMUNITY)
Admission: RE | Admit: 2014-07-18 | Discharge: 2014-07-18 | Disposition: A | Discharge status: Home / Self Care | Payer: Medicare Other | Source: Ambulatory Visit | Attending: Anesthesiology | Admitting: Anesthesiology

## 2014-07-18 DIAGNOSIS — Z0000 Encounter for general adult medical examination without abnormal findings: Secondary | ICD-10-CM | POA: Diagnosis not present

## 2014-07-18 DIAGNOSIS — F418 Other specified anxiety disorders: Secondary | ICD-10-CM | POA: Diagnosis not present

## 2014-07-18 DIAGNOSIS — I10 Essential (primary) hypertension: Secondary | ICD-10-CM | POA: Diagnosis not present

## 2014-07-18 DIAGNOSIS — E785 Hyperlipidemia, unspecified: Secondary | ICD-10-CM | POA: Diagnosis not present

## 2014-07-18 DIAGNOSIS — Z01818 Encounter for other preprocedural examination: Secondary | ICD-10-CM | POA: Diagnosis not present

## 2014-07-18 DIAGNOSIS — M160 Bilateral primary osteoarthritis of hip: Secondary | ICD-10-CM | POA: Insufficient documentation

## 2014-07-18 DIAGNOSIS — J449 Chronic obstructive pulmonary disease, unspecified: Secondary | ICD-10-CM | POA: Diagnosis not present

## 2014-07-18 DIAGNOSIS — G8929 Other chronic pain: Secondary | ICD-10-CM | POA: Insufficient documentation

## 2014-07-18 LAB — CBC
HCT: 37.7 % (ref 36.0–46.0)
Hemoglobin: 12.7 g/dL (ref 12.0–15.0)
MCH: 31.9 pg (ref 26.0–34.0)
MCHC: 33.7 g/dL (ref 30.0–36.0)
MCV: 94.7 fL (ref 78.0–100.0)
Platelets: 284 10*3/uL (ref 150–400)
RBC: 3.98 MIL/uL (ref 3.87–5.11)
RDW: 12.7 % (ref 11.5–15.5)
WBC: 7.5 10*3/uL (ref 4.0–10.5)

## 2014-07-18 LAB — BASIC METABOLIC PANEL
ANION GAP: 9 (ref 5–15)
BUN: 23 mg/dL (ref 6–23)
CALCIUM: 9.8 mg/dL (ref 8.4–10.5)
CO2: 30 mEq/L (ref 19–32)
CREATININE: 0.58 mg/dL (ref 0.50–1.10)
Chloride: 100 mEq/L (ref 96–112)
GFR calc Af Amer: >90 mL/min (ref >90)
GFR calc non Af Amer: 84 mL/min — ABNORMAL LOW (ref >90)
Glucose, Bld: 119 mg/dL — ABNORMAL HIGH (ref 70–99)
Potassium: 4 mEq/L (ref 3.7–5.3)
Sodium: 139 mEq/L (ref 137–147)

## 2014-07-18 LAB — SURGICAL PCR SCREEN
MRSA, PCR: NEGATIVE
Staphylococcus aureus: NEGATIVE

## 2014-07-23 ENCOUNTER — Encounter: Payer: Self-pay | Admitting: Nurse Practitioner

## 2014-07-23 MED ORDER — CEFAZOLIN SODIUM-DEXTROSE 2-3 GM-% IV SOLR
2.0000 g | INTRAVENOUS | Status: AC
  Administered 2014-07-24: 2 g via INTRAVENOUS
  Filled 2014-07-23: qty 50

## 2014-07-24 ENCOUNTER — Encounter (HOSPITAL_COMMUNITY): Payer: Medicare Other | Admitting: Anesthesiology

## 2014-07-24 ENCOUNTER — Inpatient Hospital Stay (HOSPITAL_COMMUNITY): Payer: Medicare Other

## 2014-07-24 ENCOUNTER — Encounter (HOSPITAL_COMMUNITY)
Admission: RE | Disposition: A | Discharge status: Home Health | Payer: Self-pay | Source: Ambulatory Visit | Attending: Orthopedic Surgery

## 2014-07-24 ENCOUNTER — Inpatient Hospital Stay (HOSPITAL_COMMUNITY): Payer: Medicare Other | Admitting: Anesthesiology

## 2014-07-24 ENCOUNTER — Inpatient Hospital Stay (HOSPITAL_COMMUNITY)
Admission: RE | Admit: 2014-07-24 | Discharge: 2014-07-28 | DRG: 470 | Disposition: A | Discharge status: Home Health | Payer: Medicare Other | Source: Ambulatory Visit | Attending: Orthopedic Surgery | Admitting: Orthopedic Surgery

## 2014-07-24 ENCOUNTER — Encounter (HOSPITAL_COMMUNITY): Payer: Self-pay | Admitting: *Deleted

## 2014-07-24 DIAGNOSIS — Z681 Body mass index (BMI) 19 or less, adult: Secondary | ICD-10-CM

## 2014-07-24 DIAGNOSIS — D62 Acute posthemorrhagic anemia: Secondary | ICD-10-CM | POA: Diagnosis not present

## 2014-07-24 DIAGNOSIS — M62838 Other muscle spasm: Secondary | ICD-10-CM | POA: Diagnosis present

## 2014-07-24 DIAGNOSIS — Z471 Aftercare following joint replacement surgery: Secondary | ICD-10-CM | POA: Diagnosis not present

## 2014-07-24 DIAGNOSIS — Z96641 Presence of right artificial hip joint: Secondary | ICD-10-CM | POA: Diagnosis present

## 2014-07-24 DIAGNOSIS — K449 Diaphragmatic hernia without obstruction or gangrene: Secondary | ICD-10-CM | POA: Diagnosis present

## 2014-07-24 DIAGNOSIS — Z86010 Personal history of colonic polyps: Secondary | ICD-10-CM

## 2014-07-24 DIAGNOSIS — E785 Hyperlipidemia, unspecified: Secondary | ICD-10-CM | POA: Diagnosis present

## 2014-07-24 DIAGNOSIS — I10 Essential (primary) hypertension: Secondary | ICD-10-CM | POA: Diagnosis present

## 2014-07-24 DIAGNOSIS — M25552 Pain in left hip: Secondary | ICD-10-CM | POA: Diagnosis not present

## 2014-07-24 DIAGNOSIS — E46 Unspecified protein-calorie malnutrition: Secondary | ICD-10-CM | POA: Diagnosis present

## 2014-07-24 DIAGNOSIS — M810 Age-related osteoporosis without current pathological fracture: Secondary | ICD-10-CM | POA: Diagnosis present

## 2014-07-24 DIAGNOSIS — M1612 Unilateral primary osteoarthritis, left hip: Principal | ICD-10-CM | POA: Diagnosis present

## 2014-07-24 DIAGNOSIS — Z9849 Cataract extraction status, unspecified eye: Secondary | ICD-10-CM

## 2014-07-24 DIAGNOSIS — K219 Gastro-esophageal reflux disease without esophagitis: Secondary | ICD-10-CM | POA: Diagnosis not present

## 2014-07-24 DIAGNOSIS — F329 Major depressive disorder, single episode, unspecified: Secondary | ICD-10-CM | POA: Diagnosis present

## 2014-07-24 DIAGNOSIS — N3281 Overactive bladder: Secondary | ICD-10-CM | POA: Diagnosis present

## 2014-07-24 DIAGNOSIS — Z85828 Personal history of other malignant neoplasm of skin: Secondary | ICD-10-CM

## 2014-07-24 DIAGNOSIS — M169 Osteoarthritis of hip, unspecified: Secondary | ICD-10-CM | POA: Diagnosis not present

## 2014-07-24 DIAGNOSIS — Z96642 Presence of left artificial hip joint: Secondary | ICD-10-CM | POA: Diagnosis not present

## 2014-07-24 DIAGNOSIS — M79609 Pain in unspecified limb: Secondary | ICD-10-CM | POA: Diagnosis not present

## 2014-07-24 HISTORY — PX: TOTAL HIP ARTHROPLASTY: SHX124

## 2014-07-24 LAB — PROTIME-INR
INR: 1.08 (ref 0.00–1.49)
Prothrombin Time: 14 seconds (ref 11.6–15.2)

## 2014-07-24 LAB — APTT: APTT: 38 s — AB (ref 24–37)

## 2014-07-24 SURGERY — ARTHROPLASTY, HIP, TOTAL,POSTERIOR APPROACH
Anesthesia: General | Site: Hip | Laterality: Left

## 2014-07-24 MED ORDER — ALBUMIN HUMAN 5 % IV SOLN
INTRAVENOUS | Status: DC | PRN
  Administered 2014-07-24: 12:00:00 via INTRAVENOUS

## 2014-07-24 MED ORDER — CALCIUM CITRATE 950 (200 CA) MG PO TABS
200.0000 mg | ORAL_TABLET | Freq: Every day | ORAL | Status: DC
  Administered 2014-07-25 – 2014-07-28 (×4): 200 mg via ORAL
  Filled 2014-07-24 (×4): qty 1

## 2014-07-24 MED ORDER — OXYCODONE HCL 5 MG PO TABS
5.0000 mg | ORAL_TABLET | ORAL | Status: DC | PRN
Start: 2014-07-24 — End: 2014-07-28
  Administered 2014-07-24: 5 mg via ORAL
  Administered 2014-07-25 – 2014-07-27 (×10): 10 mg via ORAL
  Administered 2014-07-27: 5 mg via ORAL
  Administered 2014-07-27 – 2014-07-28 (×5): 10 mg via ORAL
  Filled 2014-07-24: qty 1
  Filled 2014-07-24 (×10): qty 2
  Filled 2014-07-24: qty 1
  Filled 2014-07-24: qty 2
  Filled 2014-07-24: qty 1
  Filled 2014-07-24 (×4): qty 2

## 2014-07-24 MED ORDER — FENTANYL CITRATE 0.05 MG/ML IJ SOLN
INTRAMUSCULAR | Status: AC
  Filled 2014-07-24: qty 5

## 2014-07-24 MED ORDER — ACETAMINOPHEN 650 MG RE SUPP: 650.0000 mg | Freq: Four times a day (QID) | RECTAL | Status: DC | PRN

## 2014-07-24 MED ORDER — VENLAFAXINE HCL ER 37.5 MG PO CP24
37.5000 mg | ORAL_CAPSULE | Freq: Every day | ORAL | Status: DC
Start: 2014-07-25 — End: 2014-07-28
  Administered 2014-07-25 – 2014-07-28 (×4): 37.5 mg via ORAL
  Filled 2014-07-24 (×5): qty 1

## 2014-07-24 MED ORDER — METHOCARBAMOL 500 MG PO TABS: 500.0000 mg | ORAL_TABLET | Freq: Four times a day (QID) | ORAL | Status: DC | PRN

## 2014-07-24 MED ORDER — BUPIVACAINE HCL (PF) 0.25 % IJ SOLN
INTRAMUSCULAR | Status: AC
  Filled 2014-07-24: qty 30

## 2014-07-24 MED ORDER — ARTIFICIAL TEARS OP OINT
TOPICAL_OINTMENT | OPHTHALMIC | Status: DC | PRN
  Administered 2014-07-24: 1 via OPHTHALMIC

## 2014-07-24 MED ORDER — ONDANSETRON HCL 4 MG/2ML IJ SOLN
4.0000 mg | Freq: Four times a day (QID) | INTRAMUSCULAR | Status: DC | PRN
  Administered 2014-07-24 – 2014-07-25 (×2): 4 mg via INTRAVENOUS
  Filled 2014-07-24 (×3): qty 2

## 2014-07-24 MED ORDER — LACTATED RINGERS IV SOLN
INTRAVENOUS | Status: DC
  Administered 2014-07-24: 50 mL/h via INTRAVENOUS

## 2014-07-24 MED ORDER — DIPHENHYDRAMINE HCL 12.5 MG/5ML PO ELIX: 12.5000 mg | ORAL_SOLUTION | ORAL | Status: DC | PRN

## 2014-07-24 MED ORDER — MEPERIDINE HCL 25 MG/ML IJ SOLN: 6.2500 mg | INTRAMUSCULAR | Status: DC | PRN

## 2014-07-24 MED ORDER — METOPROLOL TARTRATE 25 MG PO TABS
25.0000 mg | ORAL_TABLET | Freq: Every day | ORAL | Status: DC
  Administered 2014-07-24 – 2014-07-28 (×4): 25 mg via ORAL
  Filled 2014-07-24 (×6): qty 1

## 2014-07-24 MED ORDER — METHOCARBAMOL 500 MG PO TABS
500.0000 mg | ORAL_TABLET | Freq: Four times a day (QID) | ORAL | Status: DC | PRN
Start: 2014-07-24 — End: 2014-07-28
  Administered 2014-07-24 – 2014-07-28 (×6): 500 mg via ORAL
  Filled 2014-07-24 (×7): qty 1

## 2014-07-24 MED ORDER — NEOSTIGMINE METHYLSULFATE 10 MG/10ML IV SOLN
INTRAVENOUS | Status: AC
  Filled 2014-07-24: qty 1

## 2014-07-24 MED ORDER — ONDANSETRON HCL 4 MG PO TABS
4.0000 mg | ORAL_TABLET | Freq: Four times a day (QID) | ORAL | Status: DC | PRN
  Administered 2014-07-26: 4 mg via ORAL
  Filled 2014-07-24 (×2): qty 1

## 2014-07-24 MED ORDER — BISACODYL 10 MG RE SUPP: 10.0000 mg | Freq: Every day | RECTAL | Status: DC | PRN

## 2014-07-24 MED ORDER — RIVAROXABAN 10 MG PO TABS
10.0000 mg | ORAL_TABLET | Freq: Every day | ORAL | Status: DC
  Administered 2014-07-25 – 2014-07-28 (×4): 10 mg via ORAL
  Filled 2014-07-24 (×5): qty 1

## 2014-07-24 MED ORDER — CARBOXYMETHYLCELLULOSE SODIUM 0.5 % OP SOLN: 1.0000 [drp] | Freq: Three times a day (TID) | OPHTHALMIC | Status: DC | PRN

## 2014-07-24 MED ORDER — FENTANYL CITRATE 0.05 MG/ML IJ SOLN
25.0000 ug | INTRAMUSCULAR | Status: DC | PRN
Start: 2014-07-24 — End: 2014-07-24

## 2014-07-24 MED ORDER — ROCURONIUM BROMIDE 100 MG/10ML IV SOLN
INTRAVENOUS | Status: DC | PRN
  Administered 2014-07-24: 50 mg via INTRAVENOUS

## 2014-07-24 MED ORDER — BUPIVACAINE HCL (PF) 0.25 % IJ SOLN
INTRAMUSCULAR | Status: DC | PRN
Start: 2014-07-24 — End: 2014-07-24
  Administered 2014-07-24: 20 mL

## 2014-07-24 MED ORDER — METOCLOPRAMIDE HCL 5 MG/ML IJ SOLN
5.0000 mg | Freq: Three times a day (TID) | INTRAMUSCULAR | Status: DC | PRN
  Filled 2014-07-24: qty 2

## 2014-07-24 MED ORDER — ONDANSETRON HCL 4 MG/2ML IJ SOLN
INTRAMUSCULAR | Status: DC | PRN
  Administered 2014-07-24: 4 mg via INTRAVENOUS

## 2014-07-24 MED ORDER — ONDANSETRON HCL 4 MG PO TABS: 4.0000 mg | ORAL_TABLET | Freq: Three times a day (TID) | ORAL | Status: DC | PRN

## 2014-07-24 MED ORDER — POTASSIUM CHLORIDE IN NACL 20-0.45 MEQ/L-% IV SOLN
INTRAVENOUS | Status: DC
  Administered 2014-07-25: 07:00:00 via INTRAVENOUS
  Filled 2014-07-24 (×8): qty 1000

## 2014-07-24 MED ORDER — PNEUMOCOCCAL VAC POLYVALENT 25 MCG/0.5ML IJ INJ
0.5000 mL | INJECTION | INTRAMUSCULAR | Status: AC
  Administered 2014-07-25: 0.5 mL via INTRAMUSCULAR
  Filled 2014-07-24: qty 0.5

## 2014-07-24 MED ORDER — OXYCODONE HCL 5 MG PO TABS: 5.0000 mg | ORAL_TABLET | Freq: Once | ORAL | Status: DC | PRN

## 2014-07-24 MED ORDER — CALCIUM CITRATE 950 (200 CA) MG PO TABS
1.0000 | ORAL_TABLET | Freq: Every day | ORAL | Status: DC
  Administered 2014-07-24 – 2014-07-27 (×4): 200 mg via ORAL
  Filled 2014-07-24 (×5): qty 1

## 2014-07-24 MED ORDER — SODIUM CHLORIDE 0.9 % IR SOLN: Status: DC | PRN

## 2014-07-24 MED ORDER — FENTANYL CITRATE 0.05 MG/ML IJ SOLN: 25.0000 ug | INTRAMUSCULAR | Status: DC | PRN

## 2014-07-24 MED ORDER — LIDOCAINE HCL (CARDIAC) 20 MG/ML IV SOLN
INTRAVENOUS | Status: DC | PRN
  Administered 2014-07-24: 100 mg via INTRAVENOUS

## 2014-07-24 MED ORDER — 0.9 % SODIUM CHLORIDE (POUR BTL) OPTIME
TOPICAL | Status: DC | PRN
  Administered 2014-07-24 (×2): 1000 mL

## 2014-07-24 MED ORDER — PHENYLEPHRINE HCL 10 MG/ML IJ SOLN
INTRAMUSCULAR | Status: DC | PRN
Start: 2014-07-24 — End: 2014-07-24
  Administered 2014-07-24 (×4): 80 ug via INTRAVENOUS

## 2014-07-24 MED ORDER — ALUM & MAG HYDROXIDE-SIMETH 200-200-20 MG/5ML PO SUSP: 30.0000 mL | ORAL | Status: DC | PRN

## 2014-07-24 MED ORDER — PANTOPRAZOLE SODIUM 40 MG PO TBEC
40.0000 mg | DELAYED_RELEASE_TABLET | Freq: Every day | ORAL | Status: DC
  Administered 2014-07-24 – 2014-07-28 (×5): 40 mg via ORAL
  Filled 2014-07-24 (×5): qty 1

## 2014-07-24 MED ORDER — PROPOFOL 10 MG/ML IV BOLUS
INTRAVENOUS | Status: AC
  Filled 2014-07-24: qty 40

## 2014-07-24 MED ORDER — LUBIPROSTONE 24 MCG PO CAPS
24.0000 ug | ORAL_CAPSULE | Freq: Two times a day (BID) | ORAL | Status: DC
  Administered 2014-07-24 – 2014-07-28 (×8): 24 ug via ORAL
  Filled 2014-07-24 (×10): qty 1

## 2014-07-24 MED ORDER — POLYETHYLENE GLYCOL 3350 17 G PO PACK: 17.0000 g | PACK | Freq: Every day | ORAL | Status: DC | PRN

## 2014-07-24 MED ORDER — ACETAMINOPHEN 325 MG PO TABS
650.0000 mg | ORAL_TABLET | Freq: Four times a day (QID) | ORAL | Status: DC | PRN
Start: 2014-07-24 — End: 2014-07-28

## 2014-07-24 MED ORDER — TRAMADOL HCL 50 MG PO TABS
50.0000 mg | ORAL_TABLET | Freq: Four times a day (QID) | ORAL | Status: DC | PRN
  Administered 2014-07-24: 50 mg via ORAL
  Filled 2014-07-24 (×2): qty 1

## 2014-07-24 MED ORDER — RIVAROXABAN 10 MG PO TABS: 10.0000 mg | ORAL_TABLET | Freq: Every day | ORAL | Status: DC

## 2014-07-24 MED ORDER — MENTHOL 3 MG MT LOZG: 1.0000 | LOZENGE | OROMUCOSAL | Status: DC | PRN

## 2014-07-24 MED ORDER — GABAPENTIN 300 MG PO CAPS
300.0000 mg | ORAL_CAPSULE | Freq: Three times a day (TID) | ORAL | Status: DC
  Administered 2014-07-24 – 2014-07-28 (×11): 300 mg via ORAL
  Filled 2014-07-24 (×13): qty 1

## 2014-07-24 MED ORDER — GLYCOPYRROLATE 0.2 MG/ML IJ SOLN
INTRAMUSCULAR | Status: AC
  Filled 2014-07-24: qty 3

## 2014-07-24 MED ORDER — PHENOL 1.4 % MT LIQD: 1.0000 | OROMUCOSAL | Status: DC | PRN

## 2014-07-24 MED ORDER — ONDANSETRON HCL 4 MG/2ML IJ SOLN
INTRAMUSCULAR | Status: AC
Start: 2014-07-24 — End: 2014-07-24
  Filled 2014-07-24: qty 2

## 2014-07-24 MED ORDER — PROPOFOL 10 MG/ML IV BOLUS
INTRAVENOUS | Status: DC | PRN
  Administered 2014-07-24: 30 mg via INTRAVENOUS
  Administered 2014-07-24: 110 mg via INTRAVENOUS
  Administered 2014-07-24: 30 mg via INTRAVENOUS

## 2014-07-24 MED ORDER — OXYCODONE-ACETAMINOPHEN 5-325 MG PO TABS: 1.0000 | ORAL_TABLET | Freq: Four times a day (QID) | ORAL | Status: DC | PRN

## 2014-07-24 MED ORDER — FENTANYL CITRATE 0.05 MG/ML IJ SOLN
INTRAMUSCULAR | Status: DC | PRN
  Administered 2014-07-24 (×8): 50 ug via INTRAVENOUS

## 2014-07-24 MED ORDER — OXYCODONE HCL 5 MG/5ML PO SOLN: 5.0000 mg | Freq: Once | ORAL | Status: DC | PRN

## 2014-07-24 MED ORDER — LOSARTAN POTASSIUM 50 MG PO TABS
50.0000 mg | ORAL_TABLET | Freq: Every day | ORAL | Status: DC
  Administered 2014-07-24 – 2014-07-28 (×4): 50 mg via ORAL
  Filled 2014-07-24 (×5): qty 1

## 2014-07-24 MED ORDER — CALCIUM CITRATE 950 (200 CA) MG PO TABS
2.0000 | ORAL_TABLET | Freq: Every day | ORAL | Status: DC
  Administered 2014-07-25 – 2014-07-28 (×4): 400 mg via ORAL
  Filled 2014-07-24 (×5): qty 2

## 2014-07-24 MED ORDER — METOCLOPRAMIDE HCL 5 MG PO TABS
5.0000 mg | ORAL_TABLET | Freq: Three times a day (TID) | ORAL | Status: DC | PRN
  Filled 2014-07-24: qty 2

## 2014-07-24 MED ORDER — ROCURONIUM BROMIDE 50 MG/5ML IV SOLN
INTRAVENOUS | Status: AC
  Filled 2014-07-24: qty 1

## 2014-07-24 MED ORDER — MIRABEGRON ER 25 MG PO TB24
25.0000 mg | ORAL_TABLET | Freq: Two times a day (BID) | ORAL | Status: DC
  Administered 2014-07-24 – 2014-07-28 (×8): 25 mg via ORAL
  Filled 2014-07-24 (×11): qty 1

## 2014-07-24 MED ORDER — DEXAMETHASONE SODIUM PHOSPHATE 10 MG/ML IJ SOLN
INTRAMUSCULAR | Status: AC
  Filled 2014-07-24: qty 1

## 2014-07-24 MED ORDER — ONDANSETRON HCL 4 MG/2ML IJ SOLN: 4.0000 mg | Freq: Once | INTRAMUSCULAR | Status: DC | PRN

## 2014-07-24 MED ORDER — GLYCOPYRROLATE 0.2 MG/ML IJ SOLN
INTRAMUSCULAR | Status: DC | PRN
  Administered 2014-07-24: 0.6 mg via INTRAVENOUS

## 2014-07-24 MED ORDER — DOCUSATE SODIUM 100 MG PO CAPS
100.0000 mg | ORAL_CAPSULE | Freq: Two times a day (BID) | ORAL | Status: DC
  Administered 2014-07-25 – 2014-07-28 (×7): 100 mg via ORAL
  Filled 2014-07-24 (×8): qty 1

## 2014-07-24 MED ORDER — MAGNESIUM CITRATE PO SOLN: 1.0000 | Freq: Once | ORAL | Status: AC | PRN

## 2014-07-24 MED ORDER — POLYVINYL ALCOHOL 1.4 % OP SOLN
1.0000 [drp] | Freq: Three times a day (TID) | OPHTHALMIC | Status: DC | PRN
  Filled 2014-07-24: qty 15

## 2014-07-24 MED ORDER — DEXAMETHASONE SODIUM PHOSPHATE 10 MG/ML IJ SOLN
INTRAMUSCULAR | Status: DC | PRN
  Administered 2014-07-24: 10 mg via INTRAVENOUS

## 2014-07-24 MED ORDER — SENNA-DOCUSATE SODIUM 8.6-50 MG PO TABS: 2.0000 | ORAL_TABLET | Freq: Every day | ORAL | Status: DC

## 2014-07-24 MED ORDER — CEFAZOLIN SODIUM-DEXTROSE 2-3 GM-% IV SOLR
2.0000 g | Freq: Four times a day (QID) | INTRAVENOUS | Status: AC
  Administered 2014-07-24 (×2): 2 g via INTRAVENOUS
  Filled 2014-07-24 (×2): qty 50

## 2014-07-24 MED ORDER — LACTATED RINGERS IV SOLN
INTRAVENOUS | Status: DC | PRN
  Administered 2014-07-24 (×2): via INTRAVENOUS

## 2014-07-24 MED ORDER — VITAMIN D3 25 MCG (1000 UNIT) PO TABS
1000.0000 [IU] | ORAL_TABLET | Freq: Two times a day (BID) | ORAL | Status: DC
  Administered 2014-07-24 – 2014-07-28 (×8): 1000 [IU] via ORAL
  Filled 2014-07-24 (×9): qty 1

## 2014-07-24 MED ORDER — METHOCARBAMOL 1000 MG/10ML IJ SOLN
500.0000 mg | Freq: Four times a day (QID) | INTRAVENOUS | Status: DC | PRN
  Filled 2014-07-24: qty 5

## 2014-07-24 MED ORDER — NEOSTIGMINE METHYLSULFATE 10 MG/10ML IV SOLN
INTRAVENOUS | Status: DC | PRN
  Administered 2014-07-24: 4 mg via INTRAVENOUS

## 2014-07-24 MED ORDER — SENNA 8.6 MG PO TABS
1.0000 | ORAL_TABLET | Freq: Two times a day (BID) | ORAL | Status: DC
  Administered 2014-07-24 – 2014-07-28 (×8): 8.6 mg via ORAL
  Filled 2014-07-24 (×9): qty 1

## 2014-07-24 MED ORDER — LIDOCAINE HCL (CARDIAC) 20 MG/ML IV SOLN
INTRAVENOUS | Status: AC
  Filled 2014-07-24: qty 5

## 2014-07-24 MED ORDER — ARTIFICIAL TEARS OP OINT
TOPICAL_OINTMENT | OPHTHALMIC | Status: AC
  Filled 2014-07-24: qty 3.5

## 2014-07-24 SURGICAL SUPPLY — 66 items
APL SKNCLS STERI-STRIP NONHPOA (GAUZE/BANDAGES/DRESSINGS) ×1
BENZOIN TINCTURE PRP APPL 2/3 (GAUZE/BANDAGES/DRESSINGS) ×2 IMPLANT
BLADE SAW SAG 73X25 THK (BLADE) ×1
BLADE SAW SGTL 73X25 THK (BLADE) ×1 IMPLANT
BRUSH FEMORAL CANAL (MISCELLANEOUS) IMPLANT
CAPT HIP PF MOP ×1 IMPLANT
CLSR STERI-STRIP ANTIMIC 1/2X4 (GAUZE/BANDAGES/DRESSINGS) ×3 IMPLANT
COVER SURGICAL LIGHT HANDLE (MISCELLANEOUS) ×2 IMPLANT
DRAPE INCISE IOBAN 66X45 STRL (DRAPES) IMPLANT
DRAPE ORTHO SPLIT 77X108 STRL (DRAPES) ×4
DRAPE PROXIMA HALF (DRAPES) ×3 IMPLANT
DRAPE SURG ORHT 6 SPLT 77X108 (DRAPES) ×2 IMPLANT
DRAPE U-SHAPE 47X51 STRL (DRAPES) ×2 IMPLANT
DRILL BIT 5/64 (BIT) ×2 IMPLANT
DRSG MEPILEX BORDER 4X12 (GAUZE/BANDAGES/DRESSINGS) ×1 IMPLANT
DRSG MEPILEX BORDER 4X8 (GAUZE/BANDAGES/DRESSINGS) IMPLANT
DRSG PAD ABDOMINAL 8X10 ST (GAUZE/BANDAGES/DRESSINGS) IMPLANT
DURAPREP 26ML APPLICATOR (WOUND CARE) ×2 IMPLANT
ELECT CAUTERY BLADE 6.4 (BLADE) ×2 IMPLANT
ELECT REM PT RETURN 9FT ADLT (ELECTROSURGICAL) ×2
ELECTRODE REM PT RTRN 9FT ADLT (ELECTROSURGICAL) ×1 IMPLANT
GAUZE SPONGE 4X4 12PLY STRL (GAUZE/BANDAGES/DRESSINGS) ×1 IMPLANT
GLOVE BIO SURGEON STRL SZ8 (GLOVE) ×1 IMPLANT
GLOVE BIOGEL PI IND STRL 6.5 (GLOVE) IMPLANT
GLOVE BIOGEL PI IND STRL 8 (GLOVE) IMPLANT
GLOVE BIOGEL PI INDICATOR 6.5 (GLOVE) ×1
GLOVE BIOGEL PI INDICATOR 8 (GLOVE) ×1
GLOVE BIOGEL PI ORTHO PRO SZ8 (GLOVE) ×1
GLOVE ORTHO TXT STRL SZ7.5 (GLOVE) ×2 IMPLANT
GLOVE PI ORTHO PRO STRL SZ8 (GLOVE) ×1 IMPLANT
GLOVE SURG ORTHO 8.0 STRL STRW (GLOVE) ×3 IMPLANT
GLOVE SURG SS PI 7.5 STRL IVOR (GLOVE) ×1 IMPLANT
GOWN STRL REUS W/ TWL LRG LVL3 (GOWN DISPOSABLE) ×1 IMPLANT
GOWN STRL REUS W/ TWL XL LVL3 (GOWN DISPOSABLE) IMPLANT
GOWN STRL REUS W/TWL LRG LVL3 (GOWN DISPOSABLE) ×4
GOWN STRL REUS W/TWL XL LVL3 (GOWN DISPOSABLE) ×6
HANDPIECE INTERPULSE COAX TIP (DISPOSABLE)
HOOD PEEL AWAY FACE SHEILD DIS (HOOD) ×5 IMPLANT
KIT BASIN OR (CUSTOM PROCEDURE TRAY) ×2 IMPLANT
KIT ROOM TURNOVER OR (KITS) ×2 IMPLANT
MANIFOLD NEPTUNE II (INSTRUMENTS) ×2 IMPLANT
NDL HYPO 25GX1X1/2 BEV (NEEDLE) ×1 IMPLANT
NEEDLE HYPO 25GX1X1/2 BEV (NEEDLE) ×2 IMPLANT
NS IRRIG 1000ML POUR BTL (IV SOLUTION) ×2 IMPLANT
PACK TOTAL JOINT (CUSTOM PROCEDURE TRAY) ×2 IMPLANT
PAD ARMBOARD 7.5X6 YLW CONV (MISCELLANEOUS) ×5 IMPLANT
PILLOW ABDUCTION HIP (SOFTGOODS) ×2 IMPLANT
PRESSURIZER FEMORAL UNIV (MISCELLANEOUS) IMPLANT
RETRIEVER SUT HEWSON (MISCELLANEOUS) ×2 IMPLANT
SET HNDPC FAN SPRY TIP SCT (DISPOSABLE) IMPLANT
SPONGE LAP 4X18 X RAY DECT (DISPOSABLE) ×1 IMPLANT
SUCTION FRAZIER TIP 10 FR DISP (SUCTIONS) ×2 IMPLANT
SUT FIBERWIRE #2 38 REV NDL BL (SUTURE) ×6
SUT MNCRL AB 4-0 PS2 18 (SUTURE) ×2 IMPLANT
SUT VIC AB 0 CT1 27 (SUTURE) ×2
SUT VIC AB 0 CT1 27XBRD ANBCTR (SUTURE) ×1 IMPLANT
SUT VIC AB 2-0 CT1 27 (SUTURE) ×2
SUT VIC AB 2-0 CT1 TAPERPNT 27 (SUTURE) ×1 IMPLANT
SUT VIC AB 3-0 SH 8-18 (SUTURE) ×2 IMPLANT
SUTURE FIBERWR#2 38 REV NDL BL (SUTURE) ×3 IMPLANT
SYR CONTROL 10ML LL (SYRINGE) ×2 IMPLANT
TOWEL OR 17X24 6PK STRL BLUE (TOWEL DISPOSABLE) ×2 IMPLANT
TOWEL OR 17X26 10 PK STRL BLUE (TOWEL DISPOSABLE) ×2 IMPLANT
TOWER CARTRIDGE SMART MIX (DISPOSABLE) IMPLANT
TRAY FOLEY CATH 14FR (SET/KITS/TRAYS/PACK) IMPLANT
WATER STERILE IRR 1000ML POUR (IV SOLUTION) ×2 IMPLANT

## 2014-07-24 NOTE — Progress Notes (Signed)
Pt arrived to pacu with abnormal EKG. Dr.Crews notified and will be by to assess pt soon. Pt aymptomatic at this time. Will cont to monitor.

## 2014-07-24 NOTE — Op Note (Signed)
07/24/2014  12:33 PM  PATIENT:  Denise Lane   MRN: [id number]  PRE-OPERATIVE DIAGNOSIS:  OA LEFT HIP  POST-OPERATIVE DIAGNOSIS:  Osteoarthritis Left Hip  PROCEDURE:  Procedure(s): LEFT TOTAL HIP ARTHROPLASTY  PREOPERATIVE INDICATIONS:    Denise Lane is an 80 y.o. female who has a diagnosis of Osteoarthritis of left hip and elected for surgical management after failing conservative treatment.  The risks benefits and alternatives were discussed with the patient including but not limited to the risks of nonoperative treatment, versus surgical intervention including infection, bleeding, nerve injury, periprosthetic fracture, the need for revision surgery, dislocation, leg length discrepancy, blood clots, cardiopulmonary complications, morbidity, mortality, among others, and they were willing to proceed.     OPERATIVE REPORT     SURGEON:  [name], MD    ASSISTANT:  Joya Gaskins, OPA-C  (Present throughout the entire procedure,  necessary for completion of procedure in a timely manner, assisting with retraction, instrumentation, and closure)   Second assistant: Mechele Claude, PA Student  ANESTHESIA:  General    COMPLICATIONS:  None.     COMPONENTS:  [employer] fit femur size 6 with a 36 mm +1.5 head ball and a gription acetabular shell size 52 with a 10 degree lipped polyethylene liner    Unique aspects of the procedure: During dislocation, I suspect that may have broken off a small inferior osteophyte off of the acetabulum because inferior placement of the cobra retractor during acetabular exposure was difficult to find a ledge. The acetabulum itself had extreme wear, and very little medial  , and I did not ream medially affectively at all. After placement of the initial cut, despite adequate with impaction, it did not want to seat completely, so I removed the cup, and reamed line to line to a 52, because I believe that was hung up on the rim. This then allowed  for complete seating of the cup during second impaction, and excellent fixation.   PROCEDURE IN DETAIL:   The patient was met in the holding area and  identified.  The appropriate hip was identified and marked at the operative site.  The patient was then transported to the OR  and  placed under general anesthesia.  At that point, the patient was  placed in the lateral decubitus position with the operative side up and  secured to the operating room table and all bony prominences padded.     The operative lower extremity was prepped from the iliac crest to the distal leg.  Sterile draping was performed.  Time out was performed prior to incision.      A routine posterolateral approach was utilized via sharp dissection  carried down to the subcutaneous tissue.  Gross bleeders were Bovie coagulated.  The iliotibial band was identified and incised along the length of the skin incision.  Self-retaining retractors were  inserted.  With the hip internally rotated, the short external rotators  were identified. The piriformis and capsule was tagged with FiberWire, and the hip capsule released in a T-type fashion.  The femoral neck was exposed, and I resected the femoral neck using the appropriate jig. This was performed at approximately a thumb's breadth above the lesser trochanter.    I then exposed the deep acetabulum, cleared out any tissue including the ligamentum teres.  A wing retractor was placed.  After adequate visualization, I excised the labrum, and then sequentially reamed.  I placed the trial acetabulum, which seated nicely, and then impacted the  real cup into place.  Appropriate version and inclination was confirmed clinically matching their bony anatomy, and also with the use of the jig.  A trial polyethylene liner was placed and the wing retractor removed.    I then prepared the proximal femur using the cookie-cutter, the lateralizing reamer, and then sequentially reamed and broached.  A trial  broach, neck, and head was utilized, and I reduced the hip and it was found to have excellent stability with functional range of motion. The trial components were then removed, and the real polyethylene liner was placed with the lip directed posteriorly.  I then impacted the real femoral prosthesis into place into the appropriate version, slightly anteverted to the normal anatomy, and I impacted the real head ball into place. The hip was then reduced and taken through functional range of motion and found to have excellent stability. Leg lengths were restored.  I then used a 2 mm drill bits to pass the FiberWire suture from the capsule and piriformis through the greater trochanter, and secured this. Excellent posterior capsular repair was achieved. I also closed the T in the capsule.  I then irrigated the hip copiously again with pulse lavage, and repaired the fascia with Vicryl, followed by Vicryl for the subcutaneous tissue, Monocryl for the skin, Steri-Strips and sterile gauze. The wounds were injected. The patient was then awakened and returned to PACU in stable and satisfactory condition. There were no complications.  [name], MD Orthopedic Surgeon [phone]   07/24/2014 12:33 PM

## 2014-07-24 NOTE — Transfer of Care (Signed)
Immediate Anesthesia Transfer of Care Note  Patient: Denise Lane  Procedure(s) Performed: Procedure(s): LEFT TOTAL HIP ARTHROPLASTY (Left)  Patient Location: PACU  Anesthesia Type:General  Level of Consciousness: awake, alert , oriented and sedated  Airway & Oxygen Therapy: Patient Spontanous Breathing and Patient connected to nasal cannula oxygen  Post-op Assessment: Report given to PACU RN, Post -op Vital signs reviewed and stable and Patient moving all extremities  Post vital signs: Reviewed and stable  Complications: No apparent anesthesia complications

## 2014-07-24 NOTE — Anesthesia Postprocedure Evaluation (Signed)
  Anesthesia Post-op Note  Patient: Denise Lane  Procedure(s) Performed: Procedure(s): LEFT TOTAL HIP ARTHROPLASTY (Left)  Patient Location: PACU  Anesthesia Type: General   Level of Consciousness: awake, alert  and oriented  Airway and Oxygen Therapy: Patient Spontanous Breathing  Post-op Pain: mild  Post-op Assessment: Post-op Vital signs reviewed  Post-op Vital Signs: Reviewed  Last Vitals:  Filed Vitals:   07/24/14 1327  BP: 180/69  Pulse: 73  Temp:   Resp: 40    Complications: No apparent anesthesia complications

## 2014-07-24 NOTE — Anesthesia Preprocedure Evaluation (Signed)
Anesthesia Evaluation  Patient identified by MRN, date of birth, ID band Patient awake    Reviewed: Allergy & Precautions, H&P , NPO status , Patient's Chart, lab work & pertinent test results  History of Anesthesia Complications (+) PONV  Airway Mallampati: I TM Distance: >3 FB Neck ROM: Full    Dental   Pulmonary          Cardiovascular hypertension, Pt. on medications     Neuro/Psych Depression    GI/Hepatic GERD-  Controlled and Medicated,  Endo/Other    Renal/GU      Musculoskeletal   Abdominal   Peds  Hematology   Anesthesia Other Findings   Reproductive/Obstetrics                           Anesthesia Physical Anesthesia Plan  ASA: II  Anesthesia Plan: General   Post-op Pain Management:    Induction: Intravenous  Airway Management Planned: Oral ETT  Additional Equipment:   Intra-op Plan:   Post-operative Plan: Extubation in OR  Informed Consent: I have reviewed the patients History and Physical, chart, labs and discussed the procedure including the risks, benefits and alternatives for the proposed anesthesia with the patient or authorized representative who has indicated his/her understanding and acceptance.     Plan Discussed with: CRNA and Surgeon  Anesthesia Plan Comments:         Anesthesia Quick Evaluation

## 2014-07-24 NOTE — Progress Notes (Signed)
Utilization review completed.  

## 2014-07-24 NOTE — H&P (Signed)
PREOPERATIVE H&P  Chief Complaint: OA LEFT HIP  HPI: Denise Lane is a 80 y.o. female who presents for preoperative history and physical with a diagnosis of OA LEFT HIP. Symptoms are rated as moderate to severe, and have been worsening.  This is significantly impairing activities of daily living.  She has elected for surgical management. She's had a previous right hip replacement, and has done very well from that although she did have a fall with a postoperative greater trochanteric hip fracture that healed nonsurgically.  She has failed injections, activity modification, pain medications, and the use of assistive walking devices and elected for left total hip replacement.  She also has a history of a GI bleed and cannot take anti-inflammatories.  Past Medical History  Diagnosis Date  . Hemochromatosis 1987  . Stenosis of esophagus   . Dysphagia   . Muscle pain   . History of colonic polyps     NOTED 11-20-09 IN COLONOSCOPY REPORT  . Diverticulosis of sigmoid colon 11-20-2009  . Hemorrhoids, internal 11-20-09  . Hyperlipidemia     no meds required  . Tremor, essential 10-29-09  . Hemochromatosis   . PONV (postoperative nausea and vomiting)   . History of bronchitis 1991-1996    "chronic; related to winter" (10/18/2012)  . History of blood transfusion     "S/P colonoscopy after polyp removed; got 2 units; esophageal bleed got 1 unit; really messed up hemochromatosis" (1/7/20214)  . Occasional tremors   . Joint pain   . Joint swelling   . Bruises easily   . H/O hiatal hernia   . Gastric ulcer   . Vomiting     occasionally  . Urinary urgency     takes Ditropan daily  . Osteoporosis   . Dry eyes     uses Refresh eye drops daily as needed  . Rheumatic fever     hx of  . Osteoarthritis   . Osteoarthritis of right hip 10/18/2012  . Chronic lower back pain   . Basal cell cancer     "face, legs" (10/18/2012)  . Squamous carcinoma     "LLE" (10/18/2012)  . Hypertension     takes  Metoprolol and Losartan daily  . Depression     takes Effexor daily  . GERD (gastroesophageal reflux disease)     takes Omeprazole daily  . Overactive bladder     takes Myrbetriq daily  . Muscle spasm     takes Robaxin daily as needed   Past Surgical History  Procedure Laterality Date  . Tonsillectomy  1953  . Appendectomy  1952  . Cholecystectomy  1977  . Tubal ligation  1975  . Lumbar disc surgery  1989  . Shoulder arthroscopy w/ rotator cuff repair  1998?    "left" (10/18/2012)  . Dilation and curettage of uterus  1975  . Cataract extraction w/ intraocular lens  implant, bilateral  2001    "both eyes" (10/18/2012)  . Esophagogastroduodenoscopy    . Colonoscopy    . Total hip arthroplasty  10/18/2012    "right" (10/18/2012)  . Skin cancer excision      "multiple" (10/18/2012)  . Total hip arthroplasty  10/18/2012    Procedure: TOTAL HIP ARTHROPLASTY;  Surgeon: Johnny Bridge, MD;  Location: [facility];  Service: Orthopedics;  Laterality: Right;   History   Social History  . Marital Status: Married    Spouse Name: N/A    Number of Children: N/A  . Years of Education:  N/A   Social History Main Topics  . Smoking status: Never Smoker   . Smokeless tobacco: Never Used  . Alcohol Use: No  . Drug Use: No  . Sexual Activity: No   Other Topics Concern  . None   Social History Narrative  . None   Family History  Problem Relation Age of Onset  . Hyperlipidemia Daughter    Allergies  Allergen Reactions  . Codeine Nausea And Vomiting  . Dilaudid [Hydromorphone Hcl] Hives and Other (See Comments)    "whelps" (10/18/2012)  . Macrodantin Hives and Other (See Comments)    "drug fever; chills; felt terrible" (10/18/2012)  . Morphine And Related Hives and Nausea And Vomiting  . Sulfa Antibiotics Hives, Rash and Other (See Comments)    "got real sick" (10/18/2012)   Prior to Admission medications   Medication Sig Start Date End Date Taking? Authorizing Provider  acetaminophen (TYLENOL)  650 MG CR tablet Take 650 mg by mouth 2 (two) times daily.    Yes Historical Provider, MD  calcium citrate (CALCITRATE - DOSED IN MG ELEMENTAL CALCIUM) 950 MG tablet Take 1-2 tablets by mouth 3 (three) times daily. 2 tablets in am and 1 tablet with lunch and supper   Yes Historical Provider, MD  carboxymethylcellulose (REFRESH PLUS) 0.5 % SOLN Place 1 drop into both eyes 3 (three) times daily as needed. For dry eyes   Yes Historical Provider, MD  cholecalciferol (VITAMIN D) 1000 UNITS tablet Take 1,000 Units by mouth 2 (two) times daily. 10/17/12  Yes Historical Provider, MD  gabapentin (NEURONTIN) 300 MG capsule Take 300 mg by mouth 3 (three) times daily.   Yes Historical Provider, MD  losartan (COZAAR) 50 MG tablet Take 50 mg by mouth daily.   Yes Historical Provider, MD  lubiprostone (AMITIZA) 24 MCG capsule Take 1 capsule (24 mcg total) by mouth 2 (two) times daily with a meal. 05/01/14  Yes Lauree Chandler, NP  methocarbamol (ROBAXIN) 500 MG tablet Take 1 tablet (500 mg total) by mouth every 6 (six) hours as needed for muscle spasms. 04/18/14  Yes Lauree Chandler, NP  metoprolol tartrate (LOPRESSOR) 25 MG tablet Take 1 tablet (25 mg total) by mouth daily. 05/25/13  Yes Lauree Chandler, NP  mirabegron ER (MYRBETRIQ) 25 MG TB24 tablet Take 1 tablet (25 mg total) by mouth 2 (two) times daily. 06/21/14  Yes Lauree Chandler, NP  omeprazole (PRILOSEC) 20 MG capsule Take 20 mg by mouth daily. 11/15/09  Yes Winfield Cunas., MD  oxyCODONE (OXY IR/ROXICODONE) 5 MG immediate release tablet Take 5 mg by mouth every 6 (six) hours as needed for moderate pain.  03/29/14  Yes Historical Provider, MD  traMADol (ULTRAM) 50 MG tablet Take 50-100 mg by mouth every 6 (six) hours as needed for moderate pain. 04/09/14  Yes Sheila Oats, MD  venlafaxine XR (EFFEXOR-XR) 37.5 MG 24 hr capsule Take 37.5 mg by mouth daily with breakfast.   Yes Historical Provider, MD     Positive ROS: All other systems have been  reviewed and were otherwise negative with the exception of those mentioned in the HPI and as above.  Physical Exam: General: Alert, no acute distress Cardiovascular: No pedal edema Respiratory: No cyanosis, no use of accessory musculature GI: No organomegaly, abdomen is soft and non-tender Skin: No lesions in the area of chief complaint Neurologic: Sensation intact distally Psychiatric: Patient is competent for consent with normal mood and affect Lymphatic: No axillary or cervical  lymphadenopathy  MUSCULOSKELETAL: Left hip has active range of motion 0-90 with no internal or external rotation. She walks with a moderate limp.  X-rays demonstrate end stage degenerative changes with periarticular osteophyte formation with sclerosis and complete loss of joint space  Assessment: OA LEFT HIP  Plan: Plan for Procedure(s): LEFT TOTAL HIP ARTHROPLASTY  The risks benefits and alternatives were discussed with the patient including but not limited to the risks of nonoperative treatment, versus surgical intervention including infection, bleeding, nerve injury, periprosthetic fracture, the need for revision surgery, dislocation, leg length discrepancy, blood clots, cardiopulmonary complications, morbidity, mortality, among others, and they were willing to proceed.     Johnny Bridge, MD Cell [phone]   07/24/2014 9:37 AM

## 2014-07-24 NOTE — Plan of Care (Signed)
Problem: Consults Goal: Diagnosis- Total Joint Replacement Primary Total Hip Left     

## 2014-07-24 NOTE — Progress Notes (Signed)
Pt brought to pacu on bed with no head board.

## 2014-07-24 NOTE — Anesthesia Procedure Notes (Signed)
Procedure Name: Intubation Date/Time: 07/24/2014 10:43 AM Performed by: Scheryl Darter Pre-anesthesia Checklist: Patient identified, Emergency Drugs available, Suction available, Patient being monitored and Timeout performed Patient Re-evaluated:Patient Re-evaluated prior to inductionOxygen Delivery Method: Circle system utilized Preoxygenation: Pre-oxygenation with 100% oxygen Intubation Type: IV induction Ventilation: Mask ventilation without difficulty Laryngoscope Size: 2 and Miller Grade View: Grade I Tube type: Oral Tube size: 7.5 mm Number of attempts: 1 Airway Equipment and Method: Stylet Placement Confirmation: ETT inserted through vocal cords under direct vision,  positive ETCO2 and breath sounds checked- equal and bilateral Secured at: 21 cm Tube secured with: Tape Dental Injury: Teeth and Oropharynx as per pre-operative assessment

## 2014-07-24 NOTE — Progress Notes (Signed)
Dr. Al Corpus at bedside to assess pt and EKG. No new orders. Pt okay to go to regular floor

## 2014-07-25 ENCOUNTER — Encounter (HOSPITAL_COMMUNITY): Payer: Self-pay | Admitting: Orthopedic Surgery

## 2014-07-25 DIAGNOSIS — M1612 Unilateral primary osteoarthritis, left hip: Secondary | ICD-10-CM | POA: Diagnosis not present

## 2014-07-25 LAB — CBC
HEMATOCRIT: 27.3 % — AB (ref 36.0–46.0)
HEMOGLOBIN: 9.2 g/dL — AB (ref 12.0–15.0)
MCH: 31.5 pg (ref 26.0–34.0)
MCHC: 33.7 g/dL (ref 30.0–36.0)
MCV: 93.5 fL (ref 78.0–100.0)
PLATELETS: 240 10*3/uL (ref 150–400)
RBC: 2.92 MIL/uL — ABNORMAL LOW (ref 3.87–5.11)
RDW: 12.9 % (ref 11.5–15.5)
WBC: 8.2 10*3/uL (ref 4.0–10.5)

## 2014-07-25 LAB — BASIC METABOLIC PANEL
ANION GAP: 10 (ref 5–15)
BUN: 14 mg/dL (ref 6–23)
CO2: 27 meq/L (ref 19–32)
CREATININE: 0.78 mg/dL (ref 0.50–1.10)
Calcium: 8.6 mg/dL (ref 8.4–10.5)
Chloride: 100 mEq/L (ref 96–112)
GFR calc Af Amer: 88 mL/min — ABNORMAL LOW (ref >90)
GFR calc non Af Amer: 76 mL/min — ABNORMAL LOW (ref >90)
Glucose, Bld: 132 mg/dL — ABNORMAL HIGH (ref 70–99)
Potassium: 4.1 mEq/L (ref 3.7–5.3)
Sodium: 137 mEq/L (ref 137–147)

## 2014-07-25 NOTE — Care Management Note (Signed)
CARE MANAGEMENT NOTE 07/25/2014  Patient:  Denise Lane, Denise Lane   Account Number:  [id number]  Date Initiated:  07/25/2014  Documentation initiated by:  Ricki Miller  Subjective/Objective Assessment:   80 yr old female admitted with osteoarthritis of left hip, underwent left total hip arthroplasty.     Action/Plan:   Case manager spoke with patient and daughter,Patient was preoperatively setup with [facility], no changes. patient's daughter assists at home.   Anticipated DC Date:  07/27/2014   Anticipated DC Plan:  [place]  CM consult      [place]   Choice offered to / List presented to:  C-1 Patient   DME arranged  [place]  3-N-1      DME agency  [place] arranged  [place]   Status of service:  In process, will continue to follow Medicare Important Message given?   (If response is "NO", the following Medicare IM given date fields will be blank) Date Medicare IM given:   Medicare IM given by:   Date Additional Medicare IM given:   Additional Medicare IM given by:    Discharge Disposition:  [place]  Per UR Regulation:  Reviewed for med. necessity/level of care/duration of stay

## 2014-07-25 NOTE — Discharge Instructions (Signed)
Diet: As you were doing prior to hospitalization   Shower:  May shower but keep the wounds dry, use an occlusive plastic wrap, NO SOAKING IN TUB.  If the bandage gets wet, change with a clean dry gauze.  Dressing:  You may change your dressing 3-5 days after surgery.  Then change the dressing daily with sterile gauze dressing.    There are sticky tapes (steri-strips) on your wounds and all the stitches are absorbable.  Leave the steri-strips in place when changing your dressings, they will peel off with time, usually 2-3 weeks.  Activity:  Increase activity slowly as tolerated, but follow the weight bearing instructions below.  No lifting or driving for 6 weeks.  Weight Bearing:   Weight bearing as tolerated.    To prevent constipation: you may use a stool softener such as -  Colace (over the counter) 100 mg by mouth twice a day  Drink plenty of fluids (prune juice may be helpful) and high fiber foods Miralax (over the counter) for constipation as needed.    Itching:  If you experience itching with your medications, try taking only a single pain pill, or even half a pain pill at a time.  You may take up to 10 pain pills per day, and you can also use benadryl over the counter for itching or also to help with sleep.   Precautions:  If you experience chest pain or shortness of breath - call 911 immediately for transfer to the hospital emergency department!!  If you develop a fever greater that 101 F, purulent drainage from wound, increased redness or drainage from wound, or calf pain -- Call the office at [phone]                                                Follow- Up Appointment:  Please call for an appointment to be seen in 2 weeks [facility] - [phone]      Joint Injection Care After Refer to this sheet in the next few days. These instructions provide you with information on caring for yourself after you have had a joint injection. Your caregiver also may give you more  specific instructions. Your treatment has been planned according to current medical practices, but problems sometimes occur. Call your caregiver if you have any problems or questions after your procedure. After any type of joint injection, it is not uncommon to experience:  Soreness, swelling, or bruising around the injection site.  Mild numbness, tingling, or weakness around the injection site caused by the numbing medicine used before or with the injection. It also is possible to experience the following effects associated with the specific agent after injection:  Iodine-based contrast agents:  Allergic reaction (itching, hives, widespread redness, and swelling beyond the injection site).  Corticosteroids (These effects are rare.):  Allergic reaction.  Increased blood sugar levels (If you have diabetes and you notice that your blood sugar levels have increased, notify your caregiver).  Increased blood pressure levels.  Mood swings.  Hyaluronic acid in the use of viscosupplementation.  Temporary heat or redness.  Temporary rash and itching.  Increased fluid accumulation in the injected joint. These effects all should resolve within a day after your procedure.  HOME CARE INSTRUCTIONS  Limit yourself to light activity the day of your procedure. Avoid lifting heavy objects, bending, stooping, or twisting.  Take prescription or over-the-counter pain medication as directed by your caregiver.  You may apply ice to your injection site to reduce pain and swelling the day of your procedure. Ice may be applied 03-04 times:  Put ice in a plastic bag.  Place a towel between your skin and the bag.  Leave the ice on for no longer than 15-20 minutes each time. SEEK IMMEDIATE MEDICAL CARE IF:   Pain and swelling get worse rather than better or extend beyond the injection site.  Numbness does not go away.  Blood or fluid continues to leak from the injection site.  You have chest  pain.  You have swelling of your face or tongue.  You have trouble breathing or you become dizzy.  You develop a fever, chills, or severe tenderness at the injection site that last longer than 1 day. MAKE SURE YOU:  Understand these instructions.  Watch your condition.  Get help right away if you are not doing well or if you get worse. Document Released: 06/11/2011 Document Revised: 12/21/2011 Document Reviewed: 06/11/2011 [facility] Patient Information 2015 [place], [place]. This information is not intended to replace advice given to you by your health care provider. Make sure you discuss any questions you have with your health care provider.   Information on my medicine - XARELTO (Rivaroxaban)  This medication education was reviewed with me or my healthcare representative as part of my discharge preparation.   Why was Xarelto prescribed for you? Xarelto was prescribed for you to reduce the risk of blood clots forming after orthopedic surgery. The medical term for these abnormal blood clots is venous thromboembolism (VTE).  What do you need to know about xarelto ? Take your Xarelto 10 mg ONCE DAILY at the same time every day. You may take it either with or without food.  If you have difficulty swallowing the tablet whole, you may crush it and mix in applesauce just prior to taking your dose.  Take Xarelto exactly as prescribed by your doctor and DO NOT stop taking Xarelto without talking to the doctor who prescribed the medication.  Stopping without other VTE prevention medication to take the place of Xarelto may increase your risk of developing a clot.  After discharge, you should have regular check-up appointments with your healthcare provider that is prescribing your Xarelto.    What do you do if you miss a dose? If you miss a dose, take it as soon as you remember on the same day then continue your regularly scheduled once daily regimen the next day. Do not take two doses  of Xarelto on the same day.   Important Safety Information A possible side effect of Xarelto is bleeding. You should call your healthcare provider right away if you experience any of the following:   Bleeding from an injury or your nose that does not stop.   Unusual colored urine (red or dark brown) or unusual colored stools (red or black).   Unusual bruising for unknown reasons.   A serious fall or if you hit your head (even if there is no bleeding).  Some medicines may interact with Xarelto and might increase your risk of bleeding while on Xarelto. To help avoid this, consult your healthcare provider or pharmacist prior to using any new prescription or non-prescription medications, including herbals, vitamins, non-steroidal anti-inflammatory drugs (NSAIDs) and supplements.  This website has more information on Xarelto: [email or web address]

## 2014-07-25 NOTE — Progress Notes (Signed)
     Subjective:  Patient reports pain as moderate.  Mainly complains of nausea.  Objective:   VITALS:   Filed Vitals:   07/25/14 0000 07/25/14 0045 07/25/14 0327 07/25/14 0558  BP:  104/46  112/43  Pulse:  73  74  Temp:  99.1 F (37.3 C)  99.3 F (37.4 C)  TempSrc:      Resp: 18 18 18 18   Height:      Weight:      SpO2: 98% 98% 98% 98%    Neurologically intact Sensation intact distally Dorsiflexion/Plantar flexion intact Incision: scant drainage   Lab Results  Component Value Date   WBC 8.2 07/25/2014   HGB 9.2* 07/25/2014   HCT 27.3* 07/25/2014   MCV 93.5 07/25/2014   PLT 240 07/25/2014   BMET    Component Value Date/Time   NA 139 07/18/2014 1238   NA 145* 07/12/2014 1544   NA 141 02/12/2014 0956   K 4.0 07/18/2014 1238   K 4.5 02/12/2014 0956   CL 100 07/18/2014 1238   CL 104 06/16/2012 1252   CO2 30 07/18/2014 1238   CO2 31* 02/12/2014 0956   GLUCOSE 119* 07/18/2014 1238   GLUCOSE 101* 07/12/2014 1544   GLUCOSE 109 02/12/2014 0956   GLUCOSE 136* 06/16/2012 1252   BUN 23 07/18/2014 1238   BUN 20 07/12/2014 1544   BUN 18.6 02/12/2014 0956   CREATININE 0.58 07/18/2014 1238   CREATININE 0.7 02/12/2014 0956   CALCIUM 9.8 07/18/2014 1238   CALCIUM 10.3 02/12/2014 0956   GFRNONAA 84* 07/18/2014 1238   GFRAA >90 07/18/2014 1238     Assessment/Plan: 1 Day Post-Op   Principal Problem:   Osteoarthritis of left hip Active Problems:   Hip arthritis   Advance diet Up with therapy Discharge home with home health Likely Friday dc home.   Denise Lane P 07/25/2014, 7:14 AM   [name], MD Cell [phone]

## 2014-07-25 NOTE — Progress Notes (Signed)
     Subjective:  Patient reports pain as moderate.  Denies numbness/tingling. Reports that her nausea has subsided and that she would like to be eating more solid foods.  States that she is urinating but denies bowel movement or flatulence.  Denies chest pain and SOB  Objective:   VITALS:   Filed Vitals:   07/25/14 0000 07/25/14 0045 07/25/14 0327 07/25/14 0558  BP:  104/46  112/43  Pulse:  73  74  Temp:  99.1 F (37.3 C)  99.3 F (37.4 C)  TempSrc:      Resp: 18 18 18 18   Height:      Weight:      SpO2: 98% 98% 98% 98%    General: NAD, sitting in bed watching television and beginning to eat breakfast  MSK: + EHL/ FHL,   2+ dorsalis pedis pulses Neuro: Sensation intact. Chest: RRR, no M/R/G.  Normal breath sounds. GI: + BS, no tenderness Skin/Dressing: C/D/I  Lab Results  Component Value Date   WBC 8.2 07/25/2014   HGB 9.2* 07/25/2014   HCT 27.3* 07/25/2014   MCV 93.5 07/25/2014   PLT 240 07/25/2014   BMET    Component Value Date/Time   NA 137 07/25/2014 0553   NA 145* 07/12/2014 1544   NA 141 02/12/2014 0956   K 4.1 07/25/2014 0553   K 4.5 02/12/2014 0956   CL 100 07/25/2014 0553   CL 104 06/16/2012 1252   CO2 27 07/25/2014 0553   CO2 31* 02/12/2014 0956   GLUCOSE 132* 07/25/2014 0553   GLUCOSE 101* 07/12/2014 1544   GLUCOSE 109 02/12/2014 0956   GLUCOSE 136* 06/16/2012 1252   BUN 14 07/25/2014 0553   BUN 20 07/12/2014 1544   BUN 18.6 02/12/2014 0956   CREATININE 0.78 07/25/2014 0553   CREATININE 0.7 02/12/2014 0956   CALCIUM 8.6 07/25/2014 0553   CALCIUM 10.3 02/12/2014 0956   GFRNONAA 76* 07/25/2014 0553   GFRAA 88* 07/25/2014 0553     Assessment/Plan: 1 Day Post-Op   Principal Problem:   Osteoarthritis of left hip Active Problems:   Hip arthritis   Up with therapy Advance diet Discharge home with home health Likely D/C home Friday.   Denise Lane 07/25/2014, 7:46 AM  Seen and agree with above.  Denise Bond, MD Cell [phone]

## 2014-07-25 NOTE — Evaluation (Signed)
Occupational Therapy Evaluation Patient Details Name: Denise Lane MRN: [id number] DOB: 06/29/1930 Today's Date: 07/25/2014    History of Present Illness 80 y.o. s/p left posterior THA.   Clinical Impression   Pt s/p above. Feel pt will benefit from acute OT to increase independence prior to d/c. Recommending HHOT and 24/7 supervision/assistance upon d/c.     Follow Up Recommendations  Home health OT;Supervision/Assistance - 24 hour    Equipment Recommendations  3 in 1 bedside comode    Recommendations for Other Services       Precautions / Restrictions Precautions Precautions: Fall;Posterior Hip Precaution Booklet Issued: No Precaution Comments: Reviewed precautions-cues given in session Restrictions Weight Bearing Restrictions: Yes LLE Weight Bearing: Weight bearing as tolerated      Mobility Bed Mobility Overal bed mobility: Needs Assistance Bed Mobility: Sit to Supine       Sit to supine: Min assist   General bed mobility comments: assist with lower extremity.  Transfers Overall transfer level: Needs assistance Equipment used: Rolling walker (2 wheeled) Transfers: Sit to/from [facility] Sit to Stand: Mod assist Stand pivot transfers: Mod assist       General transfer comment: Cues for technique and precautions.     Balance Overall balance assessment: Needs assistance         Standing balance support: During functional activity;Single extremity supported Standing balance-Leahy Scale: Poor                              ADL Overall ADL's : Needs assistance/impaired                     Lower Body Dressing: Sit to/from stand;Maximal assistance;With adaptive equipment (decreased balance)   Toilet Transfer: Moderate assistance;Stand-pivot;RW;BSC   Toileting- Clothing Manipulation and Hygiene: Sit to/from stand;Maximal assistance       Functional mobility during ADLs: Moderate assistance;Rolling walker  (stand pivot) General ADL Comments: Reviewed safety tips. Practiced with AE for LB ADLs. Reviewed dressing technique. Briefly discussed tub transfer techniques, but pt plans to sponge bathe.     Vision                     Perception     Praxis      Pertinent Vitals/Pain Pain Assessment: 0-10 Pain Score: 10-Worst pain ever Pain Location: left hip Pain Intervention(s): Monitored during session;Repositioned;RN gave pain meds during session     Hand Dominance     Extremity/Trunk Assessment Upper Extremity Assessment Upper Extremity Assessment: Generalized weakness (previous shoulder surgery on left shoulder)   Lower Extremity Assessment Lower Extremity Assessment: Defer to PT evaluation       Communication Communication Communication: No difficulties   Cognition Arousal/Alertness: Awake/alert Behavior During Therapy: WFL for tasks assessed/performed Overall Cognitive Status: Within Functional Limits for tasks assessed                     General Comments       Exercises       Shoulder Instructions      Home Living Family/patient expects to be discharged to:: Private residence Living Arrangements: Spouse/significant other Available Help at Discharge: Family;Available 24 hours/day               Bathroom Shower/Tub: Tub          Home Equipment: Radiation protection practitioner Equipment: Reacher;Sock aid;Long-handled sponge Additional Comments: Refer to PT eval for other home  information      Prior Functioning/Environment Level of Independence: Independent        Comments: unsure if she used DME before    OT Diagnosis: Acute pain   OT Problem List: Decreased strength;Pain;Decreased activity tolerance;Impaired balance (sitting and/or standing);Decreased knowledge of use of DME or AE;Decreased knowledge of precautions   OT Treatment/Interventions: Self-care/ADL training;Therapeutic exercise;DME and/or AE instruction;Therapeutic  activities;Patient/family education;Balance training    OT Goals(Current goals can be found in the care plan section) Acute Rehab OT Goals Patient Stated Goal: not stated OT Goal Formulation: With patient Time For Goal Achievement: 08/01/14 Potential to Achieve Goals: Good ADL Goals Pt Will Perform Lower Body Bathing: with set-up;with supervision;with adaptive equipment;sit to/from stand Pt Will Perform Lower Body Dressing: with set-up;with supervision;with adaptive equipment;sit to/from stand Pt Will Transfer to Toilet: with supervision;ambulating;bedside commode Pt Will Perform Toileting - Clothing Manipulation and hygiene: with supervision;sit to/from stand Additional ADL Goal #1: Pt will perform HEP for bilateral UE's to increase strength.  OT Frequency: Min 2X/week   Barriers to D/C:            Co-evaluation              End of Session Equipment Utilized During Treatment: Gait belt;Rolling walker  Activity Tolerance: Patient limited by pain Patient left: in bed;with call bell/phone within reach;with family/visitor present   Time: 4098-1191 OT Time Calculation (min): 31 min Charges:  OT General Charges $OT Visit: 1 Procedure OT Evaluation $Initial OT Evaluation Tier I: 1 Procedure OT Treatments $Self Care/Home Management : 8-22 mins G-CodesBenito Mccreedy OTR/L [phone] 07/25/2014, 1:33 PM

## 2014-07-25 NOTE — Progress Notes (Signed)
Physical Therapy Treatment Patient Details Name: Denise Lane MRN: [id number] DOB: [birth year] Today's Date: 07/25/2014    History of Present Illness 80 y.o. s/p left posterior THA.    PT Comments    Pt very motivated to participate in therapy. Pt hopeful to D/C home by Friday with daughter and husband. Pt required min (A) to mobilize to/from bathroom and balance with pericare. Will cont to follow per POC.   Follow Up Recommendations  Home health PT;Supervision/Assistance - 24 hour     Equipment Recommendations  None recommended by PT    Recommendations for Other Services OT consult     Precautions / Restrictions Precautions Precautions: Fall;Posterior Hip Precaution Booklet Issued: Yes (comment) Precaution Comments: reviewed posterior hip precautions; pt able to verbally recall precautions but requires cues during session to adhere  Required Braces or Orthoses: Other Brace/Splint Other Brace/Splint: abduction pillow (pt not tolerating pillow) Restrictions Weight Bearing Restrictions: Yes LLE Weight Bearing: Weight bearing as tolerated    Mobility  Bed Mobility Overal bed mobility: Needs Assistance Bed Mobility: Supine to Sit     Supine to sit: Min assist;HOB elevated Sit to supine: Min assist   General bed mobility comments: pt very rigid and guarding Lt LE; cues for sequencing and (A) to manage Lt LE off EOB   Transfers Overall transfer level: Needs assistance Equipment used: Rolling walker (2 wheeled) Transfers: Sit to/from Stand Sit to Stand: Min assist Stand pivot transfers: Mod assist       General transfer comment: cues for hand placement and safety with transfers from bed and from toilet; pt continues to have heavy lean posteriorly initially; max cues for upright posture and balance ; pt with narrow guarded BOS; cues to increase for balance  Ambulation/Gait Ambulation/Gait assistance: Min assist Ambulation Distance (Feet): 24 Feet Assistive  device: Rolling walker (2 wheeled) Gait Pattern/deviations: Decreased stance time - left;Decreased step length - right;Antalgic;Leaning posteriorly;Narrow base of support Gait velocity: very decr due to pain  Gait velocity interpretation: Below normal speed for age/gender General Gait Details: cues for upright posture and RW management; min (A) to balance; posterior lean initially and is unsteady with gt   Stairs            Wheelchair Mobility    Modified Rankin (Stroke Patients Only)       Balance Overall balance assessment: Needs assistance;History of Falls Sitting-balance support: Feet supported;Bilateral upper extremity supported Sitting balance-Leahy Scale: Poor Sitting balance - Comments: relying on UEs due to pain this session; leaning Rt to decr WB on Lt Postural control: Right lateral lean Standing balance support: Bilateral upper extremity supported;During functional activity Standing balance-Leahy Scale: Zero Standing balance comment: requires min (A) to maintain balance at all times and RW                     Cognition Arousal/Alertness: Awake/alert Behavior During Therapy: WFL for tasks assessed/performed Overall Cognitive Status: Within Functional Limits for tasks assessed       Memory: Decreased recall of precautions              Exercises General Exercises - Lower Extremity Ankle Circles/Pumps: AROM;Both;10 reps;Seated Quad Sets: AROM;Strengthening;Left;10 reps Gluteal Sets: AROM;10 reps Long Arc Quad: AROM;Left;10 reps;Seated;Strengthening Hip ABduction/ADduction: AAROM;Left;Strengthening;5 reps;Seated;Other (comment) (limited by pain)    General Comments General comments (skin integrity, edema, etc.): pt given ice packs for Lt ankle and Lt hip; RN aware pt c/o "burning sensation";  educated on 15-20 min for ice then remove;  pt denied pain with passive DF of Lt LE       Pertinent Vitals/Pain Pain Assessment: 0-10 Pain Score: 10-Worst  pain ever Pain Location: 10/10 with activity/ weightbearing; denies pain at rest Pain Descriptors / Indicators: Burning;Tingling;Throbbing Pain Intervention(s): Limited activity within patient's tolerance;Monitored during session;Premedicated before session;Repositioned;Ice applied    Home Living Family/patient expects to be discharged to:: Private residence Living Arrangements: Spouse/significant other Available Help at Discharge: Family;Available 24 hours/day Type of Home: House Home Access: Stairs to enter Entrance Stairs-Rails: Right;Left Home Layout: One level Home Equipment: Adaptive equipment;Walker - 2 wheels;Shower seat Additional Comments: pt will have daughter at home with her upon D/C ; pt with tub shower; reports difficulty stepping up into tub    Prior Function Level of Independence: Independent with assistive device(s)      Comments: ambulated with rollator at home since fall in June; pt was driving    PT Goals (current goals can now be found in the care plan section) Acute Rehab PT Goals Patient Stated Goal: to go home friday PT Goal Formulation: With patient Time For Goal Achievement: 08/01/14 Potential to Achieve Goals: Fair Progress towards PT goals: Progressing toward goals    Frequency  7X/week    PT Plan Current plan remains appropriate    Co-evaluation             End of Session Equipment Utilized During Treatment: Gait belt Activity Tolerance: Patient limited by pain;Patient limited by fatigue Patient left: in chair;with call bell/phone within reach     Time: 1417-1444 PT Time Calculation (min): 27 min  Charges:  $Gait Training: 8-22 mins $Therapeutic Exercise: 8-22 mins                    G Codes:      Elie Confer [place], [place] 07/25/2014, 4:07 PM

## 2014-07-25 NOTE — Evaluation (Signed)
Physical Therapy Evaluation Patient Details Name: Denise Lane MRN: [id number] DOB: September 01, 1930 Today's Date: 07/25/2014   History of Present Illness  80 y.o. s/p left posterior THA.  Clinical Impression  Pt is s/p Lt THA POD#1 resulting in the deficits listed below (see PT Problem List).  Pt will benefit from skilled PT to increase their independence and safety with mobility to allow discharge to the venue listed below. Pt motivated to return to home with husband and daughter (A). Mod (A) at this time for all mobility with heavy lean posteriorly.      Follow Up Recommendations Home health PT;Supervision/Assistance - 24 hour    Equipment Recommendations  None recommended by PT    Recommendations for Other Services OT consult     Precautions / Restrictions Precautions Precautions: Fall;Posterior Hip Precaution Booklet Issued: Yes (comment) Precaution Comments: given handout; difficulty applying with movements Required Braces or Orthoses: Other Brace/Splint Other Brace/Splint: abduction pillow Restrictions Weight Bearing Restrictions: Yes LLE Weight Bearing: Weight bearing as tolerated      Mobility  Bed Mobility Overal bed mobility: Needs Assistance Bed Mobility: Supine to Sit     Supine to sit: Min assist;HOB elevated Sit to supine: Min assist   General bed mobility comments: (A) to advance Lt LE to/off EOB  Transfers Overall transfer level: Needs assistance Equipment used: Rolling walker (2 wheeled) Transfers: Sit to/from Stand Sit to Stand: Mod assist Stand pivot transfers: Mod assist       General transfer comment: cues for hand placement and technique with RW; (A) to maintain balance; pt with heavy posterior lean  Ambulation/Gait Ambulation/Gait assistance: Mod assist Ambulation Distance (Feet): 18 Feet Assistive device: Rolling walker (2 wheeled) Gait Pattern/deviations: Decreased stance time - left;Decreased step length - right;Step-to  pattern;Antalgic;Narrow base of support;Trunk flexed;Leaning posteriorly Gait velocity: very decr due to pain  Gait velocity interpretation: Below normal speed for age/gender General Gait Details: pt leaning posteriorly; max cues for upright posture and safety with RW; limited due to decr activity tolerance and pain   Stairs            Wheelchair Mobility    Modified Rankin (Stroke Patients Only)       Balance Overall balance assessment: Needs assistance;History of Falls Sitting-balance support: Feet supported;No upper extremity supported Sitting balance-Leahy Scale: Fair Sitting balance - Comments: guarded due to pain   Postural control: Right lateral lean Standing balance support: During functional activity;Bilateral upper extremity supported Standing balance-Leahy Scale: Poor Standing balance comment: mod (A) to balance and relies on RW                             Pertinent Vitals/Pain Pain Assessment: 0-10 Pain Score: 10-Worst pain ever Pain Location: 10/10 pain with activity; denies any pain at rest  Pain Descriptors / Indicators: Aching;Discomfort Pain Intervention(s): Limited activity within patient's tolerance;Monitored during session;Premedicated before session;Repositioned    Home Living Family/patient expects to be discharged to:: Private residence Living Arrangements: Spouse/significant other Available Help at Discharge: Family;Available 24 hours/day Type of Home: House Home Access: Stairs to enter Entrance Stairs-Rails: Psychiatric nurse of Steps: 4 Home Layout: One level Home Equipment: Adaptive equipment;Walker - 2 wheels;Shower seat Additional Comments: pt will have daughter at home with her upon D/C ; pt with tub shower; reports difficulty stepping up into tub    Prior Function Level of Independence: Independent with assistive device(s)         Comments: ambulated  with rollator at home since fall in June; pt was  driving      Hand Dominance   Dominant Hand: Right    Extremity/Trunk Assessment   Upper Extremity Assessment: Defer to OT evaluation           Lower Extremity Assessment: LLE deficits/detail   LLE Deficits / Details: Lt hip 2/5; quad 3+/5  Cervical / Trunk Assessment: Normal  Communication   Communication: No difficulties  Cognition Arousal/Alertness: Awake/alert Behavior During Therapy: WFL for tasks assessed/performed Overall Cognitive Status: Within Functional Limits for tasks assessed       Memory: Decreased recall of precautions              General Comments General comments (skin integrity, edema, etc.): discussed D/C planning with pt; O2 on RA at 98% with activity ; educated on importance of elevation for edema control     Exercises General Exercises - Lower Extremity Ankle Circles/Pumps: AROM;Both;10 reps;Seated      Assessment/Plan    PT Assessment Patient needs continued PT services  PT Diagnosis Difficulty walking;Generalized weakness;Acute pain   PT Problem List Decreased strength;Decreased range of motion;Decreased activity tolerance;Decreased balance;Decreased mobility;Decreased knowledge of use of DME;Decreased safety awareness;Decreased knowledge of precautions;Pain;Impaired sensation  PT Treatment Interventions DME instruction;Gait training;Stair training;Functional mobility training;Therapeutic exercise;Therapeutic activities;Balance training;Neuromuscular re-education;Patient/family education   PT Goals (Current goals can be found in the Care Plan section) Acute Rehab PT Goals Patient Stated Goal: to go home with my husband and daughter PT Goal Formulation: With patient Time For Goal Achievement: 08/01/14 Potential to Achieve Goals: Fair    Frequency 7X/week   Barriers to discharge Decreased caregiver support lives with husband, husband cannot provide physical (A); daughter will be staying with her     Co-evaluation                End of Session Equipment Utilized During Treatment: Gait belt Activity Tolerance: Patient limited by pain;Patient limited by fatigue Patient left: in chair;with call bell/phone within reach Nurse Communication: Mobility status;Weight bearing status;Precautions         Time: 0947-0962 PT Time Calculation (min): 27 min   Charges:   PT Evaluation $Initial PT Evaluation Tier I: 1 Procedure PT Treatments $Gait Training: 8-22 mins   PT G CodesGustavus Bryant, [place] 07/25/2014, 2:08 PM

## 2014-07-26 DIAGNOSIS — D62 Acute posthemorrhagic anemia: Secondary | ICD-10-CM | POA: Diagnosis not present

## 2014-07-26 LAB — BASIC METABOLIC PANEL
Anion gap: 11 (ref 5–15)
BUN: 13 mg/dL (ref 6–23)
CO2: 27 mEq/L (ref 19–32)
CREATININE: 0.62 mg/dL (ref 0.50–1.10)
Calcium: 8.3 mg/dL — ABNORMAL LOW (ref 8.4–10.5)
Chloride: 99 mEq/L (ref 96–112)
GFR calc non Af Amer: 82 mL/min — ABNORMAL LOW (ref >90)
Glucose, Bld: 132 mg/dL — ABNORMAL HIGH (ref 70–99)
POTASSIUM: 3.4 meq/L — AB (ref 3.7–5.3)
Sodium: 137 mEq/L (ref 137–147)

## 2014-07-26 LAB — CBC
HCT: 23.5 % — ABNORMAL LOW (ref 36.0–46.0)
Hemoglobin: 8 g/dL — ABNORMAL LOW (ref 12.0–15.0)
MCH: 31.7 pg (ref 26.0–34.0)
MCHC: 34 g/dL (ref 30.0–36.0)
MCV: 93.3 fL (ref 78.0–100.0)
Platelets: 195 10*3/uL (ref 150–400)
RBC: 2.52 MIL/uL — ABNORMAL LOW (ref 3.87–5.11)
RDW: 12.8 % (ref 11.5–15.5)
WBC: 10.3 10*3/uL (ref 4.0–10.5)

## 2014-07-26 NOTE — Progress Notes (Signed)
Occupational Therapy Treatment and Discharge Patient Details Name: Denise Lane MRN: [id number] DOB: December 22, 1929 Today's Date: 07/26/2014    History of present illness 80 y.o. s/p left posterior THA. PMHx: THA of RLE   OT comments  This 80 yo seen today and all questions asked/concerns answered. Pt will benefit from continued OT at home to make sure she can get back to functioning in her own environment at an Independent to Mod I level for all BADLs and IADLs. Acute OT will sign off.  Follow Up Recommendations  Home health OT;Supervision/Assistance - 24 hour    Equipment Recommendations  3 in 1 bedside comode       Precautions / Restrictions Precautions Precautions: Fall;Posterior Hip Precaution Comments: reviewed posterior hip precautions; Restrictions Weight Bearing Restrictions: No LLE Weight Bearing: Weight bearing as tolerated              ADL                                         General ADL Comments: Pt reports that she feels very confident in using AE for LBADLs due to having to use them for her RLE back in 2014 and she has been using them for LLE ever since her pain started. She reports that she does not have any concern about getting to/from toilet and up/down from 3n1 (able to tell me the correct and safe sequence for sit to stand from 3n1).  She says she only takes sponge baths due to has doors on her tub/shower  and her husband does not want to change them out for a curtain. She reports that her daughter will be there to A through Monday and then in/out intermittenlty. Reports that her husband can A with getting things to /from table for her.                Cognition   Behavior During Therapy: WFL for tasks assessed/performed Overall Cognitive Status: Within Functional Limits for tasks assessed                                    Pertinent Vitals/ Pain       Pain Assessment: No/denies pain         Frequency Min  2X/week     Progress Toward Goals  OT Goals(current goals can now be found in the care plan section)  Progress towards OT goals:  (All education compelete)     Plan Discharge plan remains appropriate             Patient Left in chair           Time: 3754-3606 OT Time Calculation (min): 10 min  Charges: OT General Charges $OT Visit: 1 Procedure OT Treatments $Self Care/Home Management : 8-22 mins  Almon Register [phone] 07/26/2014, 9:59 AM

## 2014-07-26 NOTE — Progress Notes (Signed)
Physical Therapy Treatment Patient Details Name: Denise Lane MRN: [id number] DOB: March 30, 1930 Today's Date: 07/26/2014    History of Present Illness 80 y.o. s/p left posterior THA. PMHx: THA of RLE    PT Comments    Pt limited today due to pain. Noted skin breakdown on Lt heel and RN brought into room to assess. Pt repositioned at end of session with TED hose and socks off. Educated nurse tech to keep heels floated at all times. Will plan to follow per POC and Patient needs to practice stairs next session. Would benefit from family education session.    Follow Up Recommendations  Home health PT;Supervision/Assistance - 24 hour     Equipment Recommendations  None recommended by PT    Recommendations for Other Services       Precautions / Restrictions Precautions Precautions: Fall;Posterior Hip Precaution Comments: reviewed precautions with pt during activity  Required Braces or Orthoses: Other Brace/Splint;Spinal Brace Spinal Brace: Lumbar corset;Applied in sitting position;Other (comment) Spinal Brace Comments: pt wearing due to chronic back pain for comfort Other Brace/Splint: abduction pillow Restrictions Weight Bearing Restrictions: No LLE Weight Bearing: Weight bearing as tolerated    Mobility  Bed Mobility Overal bed mobility: Needs Assistance Bed Mobility: Supine to Sit     Supine to sit: Min assist;HOB elevated     General bed mobility comments: pt continues to require (A) to advnace Lt LE; very rigid and keeps LEs close together; cues for hand placement and sequencing  Transfers Overall transfer level: Needs assistance Equipment used: Rolling walker (2 wheeled) Transfers: Sit to/from Stand Sit to Stand: Min assist         General transfer comment: cues for technique and min (A) to balance   Ambulation/Gait Ambulation/Gait assistance: Min assist Ambulation Distance (Feet): 60 Feet Assistive device: Rolling walker (2 wheeled) Gait  Pattern/deviations: Step-to pattern;Decreased stance time - left;Decreased step length - right;Antalgic;Leaning posteriorly;Narrow base of support Gait velocity: very decr due to pain  Gait velocity interpretation: Below normal speed for age/gender General Gait Details: pt continues to ambulate with narrow BOS and cannot widen BOS due to pain; min (A) to balancee and multimodal cues for upright posture; limited by pain   Stairs            Wheelchair Mobility    Modified Rankin (Stroke Patients Only)       Balance Overall balance assessment: Needs assistance Sitting-balance support: Feet supported;No upper extremity supported Sitting balance-Leahy Scale: Fair     Standing balance support: During functional activity;Bilateral upper extremity supported Standing balance-Leahy Scale: Poor Standing balance comment: min (A) and RW to balance                     Cognition Arousal/Alertness: Awake/alert Behavior During Therapy: WFL for tasks assessed/performed Overall Cognitive Status: Within Functional Limits for tasks assessed                      Exercises General Exercises - Lower Extremity Ankle Circles/Pumps: AROM;Both;10 reps;Supine Heel Slides: AAROM;Left;5 reps;Supine    General Comments General comments (skin integrity, edema, etc.): noted skin breakdown with what appeared to be pressure ulcer on Lt heel; RN brought into room and made aware ; TED hose removed and ankles floated       Pertinent Vitals/Pain Pain Assessment: 0-10 Pain Score: 9  Pain Location: main c/o this session was Lt ankle/heel  Pain Descriptors / Indicators: Burning;Tingling Pain Intervention(s): Limited activity within patient's tolerance;Patient requesting  pain meds-RN notified;Repositioned;Monitored during session    Home Living                      Prior Function            PT Goals (current goals can now be found in the care plan section) Acute Rehab PT  Goals Patient Stated Goal: to go home tomorrow PT Goal Formulation: With patient Time For Goal Achievement: 08/01/14 Potential to Achieve Goals: Good Progress towards PT goals: Progressing toward goals    Frequency  7X/week    PT Plan Current plan remains appropriate    Co-evaluation             End of Session Equipment Utilized During Treatment: Gait belt;Back brace Activity Tolerance: Patient limited by pain Patient left: in bed;with call bell/phone within reach     Time: 0165-5374 PT Time Calculation (min): 36 min  Charges:  [employer] Training: 23-37 mins                    G CodesGustavus Bryant, [place] 07/26/2014, 4:35 PM

## 2014-07-26 NOTE — Progress Notes (Signed)
Physical Therapy Treatment Patient Details Name: Denise Lane MRN: [id number] DOB: Mar 16, 1930 Today's Date: 07/26/2014    History of Present Illness 80 y.o. s/p left posterior THA. PMHx: THA of RLE    PT Comments    Pt requiring min (A) with mobility today. Continues to be a fall risk and leans posteriorly regularly with gt. Will con t to follow per POC. Pt hopeful to D/C home with daughter tomorrow, she is a Marine scientist at [employer].   Follow Up Recommendations  Home health PT;Supervision/Assistance - 24 hour     Equipment Recommendations  None recommended by PT    Recommendations for Other Services       Precautions / Restrictions Precautions Precautions: Fall;Posterior Hip Precaution Comments: reviewed precautions with pt during activity  Required Braces or Orthoses: Other Brace/Splint;Spinal Brace Spinal Brace: Lumbar corset;Applied in sitting position;Other (comment) Spinal Brace Comments: pt wearing due to chronic back pain for comfort Other Brace/Splint: abduction pillow Restrictions Weight Bearing Restrictions: No LLE Weight Bearing: Weight bearing as tolerated    Mobility  Bed Mobility Overal bed mobility: Needs Assistance Bed Mobility: Supine to Sit     Supine to sit: Min assist     General bed mobility comments: bed flattened to simulate home; cues for sequencing and technique; incr time due to pain  Transfers Overall transfer level: Needs assistance Equipment used: Rolling walker (2 wheeled) Transfers: Sit to/from Stand Sit to Stand: Min assist         General transfer comment: cues for hand placement and hip precautions; min (A) to maintain balance with heavy lean posteriorly   Ambulation/Gait Ambulation/Gait assistance: Min guard Ambulation Distance (Feet): 50 Feet Assistive device: Rolling walker (2 wheeled) Gait Pattern/deviations: Step-to pattern;Decreased stance time - left;Decreased step length - right;Antalgic;Narrow base of  support;Trunk flexed Gait velocity: very decr due to pain  Gait velocity interpretation: Below normal speed for age/gender General Gait Details: cues for upright posture and gt sequencing; min guard to steady and manage RW primarily with directional changes    Stairs            Wheelchair Mobility    Modified Rankin (Stroke Patients Only)       Balance Overall balance assessment: Needs assistance;History of Falls Sitting-balance support: Feet supported;Single extremity supported Sitting balance-Leahy Scale: Poor Sitting balance - Comments: leaning to Rt due to pain in Lt hip  Postural control: Right lateral lean;Posterior lean Standing balance support: During functional activity;Bilateral upper extremity supported Standing balance-Leahy Scale: Poor Standing balance comment: min (A) and RW for balance ; denied any dizziness with activity                     Cognition Arousal/Alertness: Awake/alert Behavior During Therapy: WFL for tasks assessed/performed Overall Cognitive Status: Within Functional Limits for tasks assessed       Memory: Decreased recall of precautions              Exercises General Exercises - Lower Extremity Ankle Circles/Pumps: AROM;Both;10 reps;Seated Quad Sets: AROM;Strengthening;Left;10 reps Heel Slides: AAROM;Left;5 reps;Seated Hip ABduction/ADduction: AAROM;Left;Strengthening;Seated;10 reps    General Comments        Pertinent Vitals/Pain Pain Assessment: 0-10 Pain Score: 10-Worst pain ever Pain Location: cont to c/o 10/10 pain in lt hip with activity  Pain Descriptors / Indicators: Tingling;Sore Pain Intervention(s): Monitored during session;Premedicated before session;Repositioned    Home Living  Prior Function            PT Goals (current goals can now be found in the care plan section) Acute Rehab PT Goals Patient Stated Goal: to go home tomorrow PT Goal Formulation: With  patient Time For Goal Achievement: 08/01/14 Potential to Achieve Goals: Fair Progress towards PT goals: Progressing toward goals    Frequency  7X/week    PT Plan Current plan remains appropriate    Co-evaluation             End of Session Equipment Utilized During Treatment: Gait belt;Back brace Activity Tolerance: Patient tolerated treatment well Patient left: in chair;with call bell/phone within reach     Time: 0906-0930 PT Time Calculation (min): 24 min  Charges:  $Gait Training: 8-22 mins $Therapeutic Exercise: 8-22 mins                    G CodesGustavus Bryant, [place] 07/26/2014, 10:41 AM

## 2014-07-26 NOTE — Progress Notes (Signed)
     Subjective:  Patient reports pain as mild to moderate.  C/o N/V last night due to eating her food too fast.  Otherwise doing well.  States that she rested well last night, and is starting to feel better.  Denies bowel movement but confirms flatulence.  Objective:   VITALS:   Filed Vitals:   07/25/14 2028 07/26/14 0000 07/26/14 0400 07/26/14 0521  BP: 110/47   124/50  Pulse: 94   92  Temp: 99.1 F (37.3 C)   99.7 F (37.6 C)  TempSrc:      Resp: 16 17 17 16   Height:      Weight:      SpO2: 95%   91%    Neurologically intact General: NAD resting comfortably in bed.  MSK: +EHL/FHL  Neuro: Sensation intact  Chest: RRR, no M/R/G.  Normal breath sounds GI:  + BS Skin/Dressing: C/D/I  Lab Results  Component Value Date   WBC 10.3 07/26/2014   HGB 8.0* 07/26/2014   HCT 23.5* 07/26/2014   MCV 93.3 07/26/2014   PLT 195 07/26/2014   BMET    Component Value Date/Time   NA 137 07/26/2014 0503   NA 145* 07/12/2014 1544   NA 141 02/12/2014 0956   K 3.4* 07/26/2014 0503   K 4.5 02/12/2014 0956   CL 99 07/26/2014 0503   CL 104 06/16/2012 1252   CO2 27 07/26/2014 0503   CO2 31* 02/12/2014 0956   GLUCOSE 132* 07/26/2014 0503   GLUCOSE 101* 07/12/2014 1544   GLUCOSE 109 02/12/2014 0956   GLUCOSE 136* 06/16/2012 1252   BUN 13 07/26/2014 0503   BUN 20 07/12/2014 1544   BUN 18.6 02/12/2014 0956   CREATININE 0.62 07/26/2014 0503   CREATININE 0.7 02/12/2014 0956   CALCIUM 8.3* 07/26/2014 0503   CALCIUM 10.3 02/12/2014 0956   GFRNONAA 82* 07/26/2014 0503   GFRAA >90 07/26/2014 0503     Assessment/Plan: 2 Days Post-Op   Principal Problem:   Osteoarthritis of left hip Active Problems:   Postoperative anemia due to acute blood loss   Advance diet Up with therapy Discharge home with home health Continue to monitor ABL.  PMH of hemochormocytosis, will not plan transfusion unless clinically indicated Possible dc home tomorrow.   Denise Lane P 07/26/2014, 11:43 AM  Discussed  and agree with above.  [name], MD Cell [phone]

## 2014-07-26 NOTE — Addendum Note (Signed)
Addendum created 07/26/14 1844 by Lillia Abed, MD   Modules edited: Anesthesia Events

## 2014-07-27 DIAGNOSIS — M79609 Pain in unspecified limb: Secondary | ICD-10-CM

## 2014-07-27 LAB — CBC
HEMATOCRIT: 19.9 % — AB (ref 36.0–46.0)
Hemoglobin: 6.9 g/dL — CL (ref 12.0–15.0)
MCH: 32.5 pg (ref 26.0–34.0)
MCHC: 34.7 g/dL (ref 30.0–36.0)
MCV: 93.9 fL (ref 78.0–100.0)
Platelets: 182 10*3/uL (ref 150–400)
RBC: 2.12 MIL/uL — ABNORMAL LOW (ref 3.87–5.11)
RDW: 12.7 % (ref 11.5–15.5)
WBC: 9.3 10*3/uL (ref 4.0–10.5)

## 2014-07-27 LAB — PREPARE RBC (CROSSMATCH)

## 2014-07-27 MED ORDER — ACETAMINOPHEN 325 MG PO TABS
650.0000 mg | ORAL_TABLET | Freq: Once | ORAL | Status: AC
  Administered 2014-07-27: 650 mg via ORAL
  Filled 2014-07-27: qty 2

## 2014-07-27 MED ORDER — DIPHENHYDRAMINE HCL 25 MG PO CAPS: 25.0000 mg | ORAL_CAPSULE | Freq: Once | ORAL | Status: DC

## 2014-07-27 MED ORDER — ENSURE PUDDING PO PUDG
1.0000 | Freq: Three times a day (TID) | ORAL | Status: DC
  Administered 2014-07-27 – 2014-07-28 (×4): 1 via ORAL

## 2014-07-27 MED ORDER — SODIUM CHLORIDE 0.9 % IV SOLN
Freq: Once | INTRAVENOUS | Status: AC
  Administered 2014-07-27: 16:00:00 via INTRAVENOUS

## 2014-07-27 NOTE — Discharge Summary (Signed)
Physician Discharge Summary  Patient ID: Denise Lane MRN: [id number] DOB/AGE: February 01, 1930 80 y.o.  Admit date: 07/24/2014 Discharge date: 07/28/2014 anticipated   Admission Diagnoses:  Osteoarthritis of left hip  Discharge Diagnoses:  Principal Problem:   Osteoarthritis of left hip Active Problems:   Postoperative anemia due to acute blood loss protein calorie malnutrition  Estimated body mass index is 19.05 kg/(m^2) as calculated from the following:   Height as of this encounter: 5\' 6"  (1.676 m).   Weight as of this encounter: 53.524 kg (118 lb).   Past Medical History  Diagnosis Date  . Hemochromatosis 1987  . Stenosis of esophagus   . Dysphagia   . Muscle pain   . History of colonic polyps     NOTED 11-20-09 IN COLONOSCOPY REPORT  . Diverticulosis of sigmoid colon 11-20-2009  . Hemorrhoids, internal 11-20-09  . Hyperlipidemia     no meds required  . Tremor, essential 10-29-09  . Hemochromatosis   . PONV (postoperative nausea and vomiting)   . History of bronchitis 1991-1996    "chronic; related to winter" (10/18/2012)  . History of blood transfusion     "S/P colonoscopy after polyp removed; got 2 units; esophageal bleed got 1 unit; really messed up hemochromatosis" (1/7/20214)  . Occasional tremors   . Joint pain   . Joint swelling   . Bruises easily   . H/O hiatal hernia   . Gastric ulcer   . Vomiting     occasionally  . Urinary urgency     takes Ditropan daily  . Osteoporosis   . Dry eyes     uses Refresh eye drops daily as needed  . Rheumatic fever     hx of  . Osteoarthritis   . Osteoarthritis of right hip 10/18/2012  . Chronic lower back pain   . Basal cell cancer     "face, legs" (10/18/2012)  . Squamous carcinoma     "LLE" (10/18/2012)  . Hypertension     takes Metoprolol and Losartan daily  . Depression     takes Effexor daily  . GERD (gastroesophageal reflux disease)     takes Omeprazole daily  . Overactive bladder     takes Myrbetriq daily  .  Muscle spasm     takes Robaxin daily as needed    Surgeries: Procedure(s): LEFT TOTAL HIP ARTHROPLASTY on 07/24/2014   Consultants (if any):    Discharged Condition: Improved  Hospital Course: Denise Lane is an 80 y.o. female who was admitted 07/24/2014 with a diagnosis of Osteoarthritis of left hip and went to the operating room on 07/24/2014 and underwent the above named procedures.    She was given perioperative antibiotics:  Anti-infectives   Start     Dose/Rate Route Frequency Ordered Stop   07/24/14 1700  ceFAZolin (ANCEF) IVPB 2 g/50 mL premix     2 g 100 mL/hr over 30 Minutes Intravenous Every 6 hours 07/24/14 1534 07/24/14 2356   07/24/14 0600  ceFAZolin (ANCEF) IVPB 2 g/50 mL premix     2 g 100 mL/hr over 30 Minutes Intravenous On call to O.R. 07/23/14 1424 07/24/14 1048    .  She was given sequential compression devices, early ambulation, and xarelto for DVT prophylaxis.  She benefited maximally from the hospital stay and there were no complications.    Recent vital signs:  Filed Vitals:   07/27/14 0543  BP: 104/52  Pulse: 105  Temp: 98.4 F (36.9 C)  Resp:  Recent laboratory studies:  Lab Results  Component Value Date   HGB 6.9* 07/27/2014   HGB 8.0* 07/26/2014   HGB 9.2* 07/25/2014   Lab Results  Component Value Date   WBC 9.3 07/27/2014   PLT 182 07/27/2014   Lab Results  Component Value Date   INR 1.08 07/24/2014   Lab Results  Component Value Date   NA 137 07/26/2014   K 3.4* 07/26/2014   CL 99 07/26/2014   CO2 27 07/26/2014   BUN 13 07/26/2014   CREATININE 0.62 07/26/2014   GLUCOSE 132* 07/26/2014    Discharge Medications:     Medication List    STOP taking these medications       oxyCODONE 5 MG immediate release tablet  Commonly known as:  Oxy IR/ROXICODONE      TAKE these medications       acetaminophen 650 MG CR tablet  Commonly known as:  TYLENOL  Take 650 mg by mouth 2 (two) times daily.     calcium  citrate 950 MG tablet  Commonly known as:  CALCITRATE - dosed in mg elemental calcium  Take 1-2 tablets by mouth 3 (three) times daily. 2 tablets in am and 1 tablet with lunch and supper     carboxymethylcellulose 0.5 % Soln  Commonly known as:  REFRESH PLUS  Place 1 drop into both eyes 3 (three) times daily as needed. For dry eyes     cholecalciferol 1000 UNITS tablet  Commonly known as:  VITAMIN D  Take 1,000 Units by mouth 2 (two) times daily.     gabapentin 300 MG capsule  Commonly known as:  NEURONTIN  Take 300 mg by mouth 3 (three) times daily.     losartan 50 MG tablet  Commonly known as:  COZAAR  Take 50 mg by mouth daily.     lubiprostone 24 MCG capsule  Commonly known as:  AMITIZA  Take 1 capsule (24 mcg total) by mouth 2 (two) times daily with a meal.     methocarbamol 500 MG tablet  Commonly known as:  ROBAXIN  Take 1 tablet (500 mg total) by mouth every 6 (six) hours as needed for muscle spasms.     metoprolol tartrate 25 MG tablet  Commonly known as:  LOPRESSOR  Take 1 tablet (25 mg total) by mouth daily.     mirabegron ER 25 MG Tb24 tablet  Commonly known as:  MYRBETRIQ  Take 1 tablet (25 mg total) by mouth 2 (two) times daily.     omeprazole 20 MG capsule  Commonly known as:  PRILOSEC  Take 20 mg by mouth daily.     ondansetron 4 MG tablet  Commonly known as:  ZOFRAN  Take 1 tablet (4 mg total) by mouth every 8 (eight) hours as needed for nausea or vomiting.     oxyCODONE-acetaminophen 5-325 MG per tablet  Commonly known as:  ROXICET  Take 1-2 tablets by mouth every 6 (six) hours as needed for severe pain.     rivaroxaban 10 MG Tabs tablet  Commonly known as:  XARELTO  Take 1 tablet (10 mg total) by mouth daily.     sennosides-docusate sodium 8.6-50 MG tablet  Commonly known as:  SENOKOT-S  Take 2 tablets by mouth daily.     traMADol 50 MG tablet  Commonly known as:  ULTRAM  Take 50-100 mg by mouth every 6 (six) hours as needed for moderate  pain.     venlafaxine XR 37.5 MG  24 hr capsule  Commonly known as:  EFFEXOR-XR  Take 37.5 mg by mouth daily with breakfast.        Diagnostic Studies: Dg Chest 2 View  07/18/2014   CLINICAL DATA:  Preop evaluation for total hip replacement. Chronic pain. Hypertension.  EXAM: CHEST  2 VIEW  FINDINGS: Mediastinum and hilar structures are normal. Lungs are clear.COPD . Apical pleural thickening consistent with scarring. Heart size normal. No pleural effusion or pneumothorax. Degenerative changes thoracic spine. Diffuse osteopenia. Surgical clips right upper quadrant.  IMPRESSION: No acute cardiopulmonary disease.  COPD.   Electronically Signed   By: Marcello Moores  Register   On: 07/18/2014 14:36   Dg Pelvis Portable  07/24/2014   CLINICAL DATA:  Postop left hip.  EXAM: PORTABLE PELVIS 1-2 VIEWS  COMPARISON:  04/07/2014  FINDINGS: The patient has undergone interval left total hip arthroplasty. Hardware appears intact. No evidence for dislocation on this frontal view. Postoperative subcutaneous gas is present. No acute fracture identified.  IMPRESSION: Status post total hip arthroplasty.  No adverse features identified.   Electronically Signed   By: Shon Hale M.D.   On: 07/24/2014 13:42   Dg Hip Portable 1 View Left  07/24/2014   CLINICAL DATA:  Status post left hip arthroplasty.  EXAM: PORTABLE LEFT HIP - 1 VIEW  COMPARISON:  March 28, 2014.  FINDINGS: Status post left hip arthroplasty. The femoral and acetabular components appear well situated. No fracture dislocation is noted.  IMPRESSION: Status post left hip arthroplasty.   Electronically Signed   By: Sabino Dick M.D.   On: 07/24/2014 13:47    Disposition: 06-Home-Health Care Svc      Discharge Instructions   Posterior total hip precautions    Complete by:  As directed      Weight bearing as tolerated    Complete by:  As directed            Follow-up Information   Follow up with Johnny Bridge, MD. Schedule an appointment as soon as  possible for a visit in 2 weeks.   Specialty:  Orthopedic Surgery   Contact information:   [place] [address] [phone]       Follow up with [facility]. (Someone from [facility] will contact you concerning start date and time for physical therapy. )    Contact information:   [address] [place] [address] [phone]        Signed: Johnny Bridge 07/27/2014, 12:15 PM

## 2014-07-27 NOTE — Progress Notes (Addendum)
Patient ID: Denise Lane, female   DOB: 28-May-1930, 80 y.o.   MRN: [id number]     Subjective:  Patient reports pain as mild to moderate.  Patient alert and in no acute distress denies any CP or SOB, denies hematemesis or hematochezia.  Objective:   VITALS:   Filed Vitals:   07/26/14 2042 07/27/14 0000 07/27/14 0400 07/27/14 0543  BP: 95/39   104/52  Pulse: 99   105  Temp: 99.9 F (37.7 C)   98.4 F (36.9 C)  TempSrc: Oral   Oral  Resp:  16 17   Height:      Weight:      SpO2: 96%   95%    ABD soft Sensation intact distally Dorsiflexion/Plantar flexion intact Incision: dressing C/D/I and no drainage Good foot and ankle motion Abdomen soft  Lab Results  Component Value Date   WBC 9.3 07/27/2014   HGB 6.9* 07/27/2014   HCT 19.9* 07/27/2014   MCV 93.9 07/27/2014   PLT 182 07/27/2014   BMET    Component Value Date/Time   NA 137 07/26/2014 0503   NA 145* 07/12/2014 1544   NA 141 02/12/2014 0956   K 3.4* 07/26/2014 0503   K 4.5 02/12/2014 0956   CL 99 07/26/2014 0503   CL 104 06/16/2012 1252   CO2 27 07/26/2014 0503   CO2 31* 02/12/2014 0956   GLUCOSE 132* 07/26/2014 0503   GLUCOSE 101* 07/12/2014 1544   GLUCOSE 109 02/12/2014 0956   GLUCOSE 136* 06/16/2012 1252   BUN 13 07/26/2014 0503   BUN 20 07/12/2014 1544   BUN 18.6 02/12/2014 0956   CREATININE 0.62 07/26/2014 0503   CREATININE 0.7 02/12/2014 0956   CALCIUM 8.3* 07/26/2014 0503   CALCIUM 10.3 02/12/2014 0956   GFRNONAA 82* 07/26/2014 0503   GFRAA >90 07/26/2014 0503     Assessment/Plan: 3 Days Post-Op   Principal Problem:   Osteoarthritis of left hip Active Problems:   Postoperative anemia due to acute blood loss   Advance diet Up with therapy ABLA patient does not want blood  Due to PMH of hemochormocytosis Continue to monitor    DOUGLAS PARRY, BRANDON 07/27/2014, 7:26 AM  Discussed with patient, we're going to give her 2 units of packed red blood cells given her severe anemia, her Doppler  ultrasound was just done and was negative. If she is feeling better tomorrow, and her hemoglobin is improved then we will plan for discharge home tomorrow  [name], MD Cell [phone]

## 2014-07-27 NOTE — Progress Notes (Signed)
Physical Therapy Treatment Patient Details Name: Denise Lane MRN: [id number] DOB: 10/16/1929 Today's Date: 07/27/2014    History of Present Illness 80 y.o. s/p left posterior THA. PMHx: THA of RLE    PT Comments    Patient tolerated therapy session well this AM, ambulating up to 80 feet with min guard assist while using a rolling walker. Safely completed stair training and therapeutic exercises. She correctly verbalizes 2/3 posterior hip precautions at this time. Reviewed these items and demonstrated safe mobility techniques while maintaining precautions.  Follow Up Recommendations  Home health PT;Supervision/Assistance - 24 hour     Equipment Recommendations  None recommended by PT    Recommendations for Other Services       Precautions / Restrictions Precautions Precautions: Fall;Posterior Hip Precaution Comments: reviewed precautions - able to correctly recall 2/3 posterior hip precautions Required Braces or Orthoses: Other Brace/Splint;Spinal Brace Spinal Brace: Lumbar corset;Applied in sitting position Spinal Brace Comments: pt wearing due to chronic back pain for comfort Other Brace/Splint: abduction pillow Restrictions Weight Bearing Restrictions: Yes LLE Weight Bearing: Weight bearing as tolerated    Mobility  Bed Mobility Overal bed mobility: Needs Assistance Bed Mobility: Supine to Sit     Supine to sit: Min assist;+2 for physical assistance     General bed mobility comments: Min assist for trunk control. Educated on log roll technique for comfort with pillow between knees to maintain hip precautions. VC throughout for technique.  Transfers Overall transfer level: Needs assistance Equipment used: Rolling walker (2 wheeled) Transfers: Sit to/from Stand Sit to Stand: Min assist;+2 safety/equipment         General transfer comment: Min assist for boost to stand and tactile cues for forward translation due to posterior lean. Second person available  for safety. VC for hand placement and to bring Rt foot undeneath base of support  Ambulation/Gait Ambulation/Gait assistance: Min guard;+2 safety/equipment Ambulation Distance (Feet): 80 Feet Assistive device: Rolling walker (2 wheeled) Gait Pattern/deviations: Step-to pattern;Step-through pattern;Decreased step length - right;Decreased stance time - left;Antalgic;Trunk flexed;Narrow base of support   Gait velocity interpretation: Below normal speed for age/gender General Gait Details: Min guard for safety with second person to follow with chair but not used during this bout. VC for sequencing, forward gaze, upright posture, and wider step. No loss of balance. Fair control of RW.   Stairs Stairs: Yes Stairs assistance: Min guard Stair Management: One rail Left;Step to pattern;Sideways Number of Stairs: 2 (x2) General stair comments: Educated on safe stair navigation on steps and set-up similar to home environment. Pt was able to safely perform this task with cues for sequencing on first bout and was able to correctly teach back and demonstrate understanding on second trail.  Wheelchair Mobility    Modified Rankin (Stroke Patients Only)       Balance                                    Cognition Arousal/Alertness: Awake/alert Behavior During Therapy: WFL for tasks assessed/performed Overall Cognitive Status: Within Functional Limits for tasks assessed                      Exercises General Exercises - Lower Extremity Ankle Circles/Pumps: AROM;Both;10 reps;Seated Quad Sets: AROM;Strengthening;Left;10 reps Gluteal Sets: Strengthening;Both;10 reps;Seated Long Arc Quad: Strengthening;Left;10 reps;Seated    General Comments General comments (skin integrity, edema, etc.): Left with heels floating  Pertinent Vitals/Pain Pain Assessment: 0-10 Pain Score: 0-No pain Pain Location: no hip pain at the moment Pain Intervention(s): Monitored during  session;Repositioned    Home Living                      Prior Function            PT Goals (current goals can now be found in the care plan section) Acute Rehab PT Goals PT Goal Formulation: With patient Time For Goal Achievement: 08/01/14 Potential to Achieve Goals: Good Progress towards PT goals: Progressing toward goals    Frequency  7X/week    PT Plan Current plan remains appropriate    Co-evaluation             End of Session Equipment Utilized During Treatment: Gait belt Activity Tolerance: Patient tolerated treatment well Patient left: with call bell/phone within reach;in chair     Time: 0921-0949 PT Time Calculation (min): 28 min  Charges:  $Gait Training: 8-22 mins $Therapeutic Activity: 8-22 mins                    G Codes:     [employer], [place]  Denise Lane 07/27/2014, 10:18 AM

## 2014-07-27 NOTE — Progress Notes (Signed)
PT Cancellation Note  Patient Details Name: Denise Lane MRN: [id number] DOB: July 15, 1930   Cancelled Treatment:    Reason Eval/Treat Not Completed: Fatigue/lethargy limiting ability to participate Pt politely declines physical therapy session this afternoon. States she is very tired and requests PT to be held until tomorrow. Of note, RN notified PT that patient is awaiting a blood transfusion due to decreased Hgb. Will follow up prior to d/c.  [place], [place]   [name] 07/27/2014, 4:10 PM

## 2014-07-27 NOTE — Progress Notes (Signed)
CRITICAL VALUE ALERT  Critical value received:  Hgb 6.9  Date of notification:  07/27/2014  Time of notification:  3159  Critical value read back:Yes.    Nurse who received alert:  Raksha Wolfgang [place] Adriell Polansky, RN  MD notified (1st page):  Alain Marion  Time of first page:  0543  MD notified (2nd page):   Time of second page:  Responding MD:    Time MD responded:  [phone]  This RN spoke with Dr. Edmonia Lynch regarding critically low hgb. No new orders received at this time. Nursing will continue to monitor.

## 2014-07-27 NOTE — Progress Notes (Signed)
VASCULAR LAB PRELIMINARY  PRELIMINARY  PRELIMINARY  PRELIMINARY  Left lower extremity venous duplex completed.    Preliminary report:  Left:  No evidence of DVT, superficial thrombosis, or Baker's cyst.   Lashala Laser, RVT 07/27/2014, 12:09 PM

## 2014-07-28 LAB — TYPE AND SCREEN
ABO/RH(D): O POS
Antibody Screen: NEGATIVE
UNIT DIVISION: 0
Unit division: 0

## 2014-07-28 LAB — HEMOGLOBIN AND HEMATOCRIT, BLOOD
HEMATOCRIT: 28.7 % — AB (ref 36.0–46.0)
HEMOGLOBIN: 9.7 g/dL — AB (ref 12.0–15.0)

## 2014-07-28 NOTE — Progress Notes (Signed)
[facility] Progress Note Weekend Coverage   Subjective  Doing well  Ready to go home Tolerated blood transfusion yesterday, feels much better  No new issues   Review of Systems  Constitutional: Negative for fever and chills.  Respiratory: Negative for shortness of breath and wheezing.   Cardiovascular: Negative for chest pain.  Gastrointestinal: Negative for nausea, vomiting and abdominal pain.  Neurological: Negative for tingling and sensory change.     Objective   BP 117/58  Pulse 89  Temp(Src) 98 F (36.7 C) (Oral)  Resp 16  Ht 5\' 6"  (1.676 m)  Wt 53.524 kg (118 lb)  BMI 19.05 kg/m2  SpO2 96%  Intake/Output     10/16 0701 - 10/17 0700 10/17 0701 - 10/18 0700   P.O. 500    I.V. (mL/kg) 500 (9.3)    Blood 669    Total Intake(mL/kg) 1669 (31.2)    Net +1669          Urine Occurrence 2 x      Labs  Results for ROBENA, EWY (MRN [id number]) as of 07/28/2014 12:02  Ref. Range 07/28/2014 03:55  Hemoglobin Latest Range: 12.0-15.0 g/dL 9.7 (L)  HCT Latest Range: 36.0-46.0 % 28.7 (L)    Exam  NFA:OZHYQMV in bedside chair eating PB crackers, NAD, comfortable, very pleasant  Lungs: clear Cardiac: s1 and s2, RRR Abd: soft, NTND Ext:     Left Lower Extremity     Dressing c/d/i   Distal motor and sensory functions intact   Ext warm   + DP pulse   No DCT    Compartments soft and NT   Assessment and Plan   POD/HD#: 54   80 y/o white female s/p L THA  1. L hip DJD s/p L THA  WBAT  Dressing changes as needed  Continue with walker  Posterior hip precautions   2. ABL anemia  Improved after 2 units of PRBC's  Stable  3. Medical issues   Stable  4. DVT/PE prophylaxis  xarelto   5. Dispo  Dc home today   Follow up with Dr. Mardelle Matte in 2 weeks      Jari Pigg, PA-C [facility] [phone] (O) 07/28/2014 11:59 AM

## 2014-07-28 NOTE — Progress Notes (Signed)
Physical Therapy Treatment Patient Details Name: Denise Lane MRN: [id number] DOB: Jul 03, 1930 Today's Date: 07/28/2014    History of Present Illness 80 y.o. s/p left posterior THA. PMHx: THA of RLE    PT Comments    Pt did much better with mobility today. She is hopeful she will be able to DC home today. No family present for session.    Follow Up Recommendations  Home health PT;Supervision/Assistance - 24 hour     Equipment Recommendations  None recommended by PT    Recommendations for Other Services       Precautions / Restrictions Precautions Precautions: Fall;Posterior Hip Precaution Booklet Issued: Yes (comment) Precaution Comments: reviewed precautions - able to correctly recall 2/3 posterior hip precautions Required Braces or Orthoses: Spinal Brace Spinal Brace: Lumbar corset;Applied in sitting position Spinal Brace Comments: pt wearing due to chronic back pain for comfort Restrictions Weight Bearing Restrictions: Yes LLE Weight Bearing: Weight bearing as tolerated    Mobility  Bed Mobility Overal bed mobility: Needs Assistance Bed Mobility: Supine to Sit     Supine to sit: Min assist     General bed mobility comments: needed a bit of physical assist to scoot forward to EOB. Took lots of time    Transfers Overall transfer level: Needs assistance Equipment used: Rolling walker (2 wheeled) Transfers: Sit to/from Stand Sit to Stand: Min guard         General transfer comment: Cues for hand placement. Did not require physical assist today  Ambulation/Gait Ambulation/Gait assistance: Min guard Ambulation Distance (Feet): 90 Feet Assistive device: Rolling walker (2 wheeled) Gait Pattern/deviations: Step-to pattern;Decreased stride length   Gait velocity interpretation: Below normal speed for age/gender General Gait Details: gait much improved today   Stairs Stairs:  (Pt did not want to practice stairs again today)          Wheelchair  Mobility    Modified Rankin (Stroke Patients Only)       Balance                                    Cognition Arousal/Alertness: Awake/alert Behavior During Therapy: WFL for tasks assessed/performed Overall Cognitive Status: Within Functional Limits for tasks assessed       Memory: Decreased recall of precautions              Exercises General Exercises - Lower Extremity Ankle Circles/Pumps: AROM;Both;10 reps;Supine Short Arc Quad: AROM;10 reps;Left;Supine Heel Slides: AAROM;Left;10 reps;Supine Hip ABduction/ADduction: AAROM;Left;10 reps;Supine    General Comments General comments (skin integrity, edema, etc.): No family present. Pt very cooperative with treatment. Heels floated at end of session      Pertinent Vitals/Pain Pain Assessment: 0-10 Pain Score: 6  Pain Location: left hip and thigh-with gait and exercise Pain Intervention(s): Limited activity within patient's tolerance;Monitored during session;Premedicated before session;Repositioned    Home Living                      Prior Function            PT Goals (current goals can now be found in the care plan section) Progress towards PT goals: Progressing toward goals    Frequency  7X/week    PT Plan Current plan remains appropriate    Co-evaluation             End of Session Equipment Utilized During Treatment: Gait belt;Back brace Activity Tolerance:  Patient tolerated treatment well Patient left: with call bell/phone within reach;in chair     Time: 1031-5945 PT Time Calculation (min): 34 min  Charges:  $Gait Training: 8-22 mins $Therapeutic Exercise: 8-22 mins                    G Codes:      Melvern Banker 2014-08-15, 10:22 AM Lavonia Dana, PT  [phone] Aug 15, 2014

## 2014-07-30 DIAGNOSIS — Z471 Aftercare following joint replacement surgery: Secondary | ICD-10-CM | POA: Diagnosis not present

## 2014-07-30 DIAGNOSIS — I10 Essential (primary) hypertension: Secondary | ICD-10-CM | POA: Diagnosis not present

## 2014-07-30 DIAGNOSIS — M810 Age-related osteoporosis without current pathological fracture: Secondary | ICD-10-CM | POA: Diagnosis not present

## 2014-07-30 DIAGNOSIS — G250 Essential tremor: Secondary | ICD-10-CM | POA: Diagnosis not present

## 2014-07-30 DIAGNOSIS — Z96641 Presence of right artificial hip joint: Secondary | ICD-10-CM | POA: Diagnosis not present

## 2014-07-30 DIAGNOSIS — Z96642 Presence of left artificial hip joint: Secondary | ICD-10-CM | POA: Diagnosis not present

## 2014-07-30 DIAGNOSIS — F329 Major depressive disorder, single episode, unspecified: Secondary | ICD-10-CM | POA: Diagnosis not present

## 2014-08-01 DIAGNOSIS — I10 Essential (primary) hypertension: Secondary | ICD-10-CM | POA: Diagnosis not present

## 2014-08-01 DIAGNOSIS — F329 Major depressive disorder, single episode, unspecified: Secondary | ICD-10-CM | POA: Diagnosis not present

## 2014-08-01 DIAGNOSIS — Z471 Aftercare following joint replacement surgery: Secondary | ICD-10-CM | POA: Diagnosis not present

## 2014-08-01 DIAGNOSIS — M810 Age-related osteoporosis without current pathological fracture: Secondary | ICD-10-CM | POA: Diagnosis not present

## 2014-08-01 DIAGNOSIS — G250 Essential tremor: Secondary | ICD-10-CM | POA: Diagnosis not present

## 2014-08-01 DIAGNOSIS — Z96642 Presence of left artificial hip joint: Secondary | ICD-10-CM | POA: Diagnosis not present

## 2014-08-03 DIAGNOSIS — M810 Age-related osteoporosis without current pathological fracture: Secondary | ICD-10-CM | POA: Diagnosis not present

## 2014-08-03 DIAGNOSIS — I10 Essential (primary) hypertension: Secondary | ICD-10-CM | POA: Diagnosis not present

## 2014-08-03 DIAGNOSIS — G250 Essential tremor: Secondary | ICD-10-CM | POA: Diagnosis not present

## 2014-08-03 DIAGNOSIS — Z96642 Presence of left artificial hip joint: Secondary | ICD-10-CM | POA: Diagnosis not present

## 2014-08-03 DIAGNOSIS — F329 Major depressive disorder, single episode, unspecified: Secondary | ICD-10-CM | POA: Diagnosis not present

## 2014-08-03 DIAGNOSIS — Z471 Aftercare following joint replacement surgery: Secondary | ICD-10-CM | POA: Diagnosis not present

## 2014-08-06 DIAGNOSIS — F329 Major depressive disorder, single episode, unspecified: Secondary | ICD-10-CM | POA: Diagnosis not present

## 2014-08-06 DIAGNOSIS — I10 Essential (primary) hypertension: Secondary | ICD-10-CM | POA: Diagnosis not present

## 2014-08-06 DIAGNOSIS — Z96642 Presence of left artificial hip joint: Secondary | ICD-10-CM | POA: Diagnosis not present

## 2014-08-06 DIAGNOSIS — G250 Essential tremor: Secondary | ICD-10-CM | POA: Diagnosis not present

## 2014-08-06 DIAGNOSIS — M810 Age-related osteoporosis without current pathological fracture: Secondary | ICD-10-CM | POA: Diagnosis not present

## 2014-08-06 DIAGNOSIS — Z471 Aftercare following joint replacement surgery: Secondary | ICD-10-CM | POA: Diagnosis not present

## 2014-08-07 DIAGNOSIS — Z96642 Presence of left artificial hip joint: Secondary | ICD-10-CM | POA: Diagnosis not present

## 2014-08-08 DIAGNOSIS — F329 Major depressive disorder, single episode, unspecified: Secondary | ICD-10-CM | POA: Diagnosis not present

## 2014-08-08 DIAGNOSIS — Z96642 Presence of left artificial hip joint: Secondary | ICD-10-CM | POA: Diagnosis not present

## 2014-08-08 DIAGNOSIS — Z471 Aftercare following joint replacement surgery: Secondary | ICD-10-CM | POA: Diagnosis not present

## 2014-08-08 DIAGNOSIS — M810 Age-related osteoporosis without current pathological fracture: Secondary | ICD-10-CM | POA: Diagnosis not present

## 2014-08-08 DIAGNOSIS — G250 Essential tremor: Secondary | ICD-10-CM | POA: Diagnosis not present

## 2014-08-08 DIAGNOSIS — I10 Essential (primary) hypertension: Secondary | ICD-10-CM | POA: Diagnosis not present

## 2014-08-10 DIAGNOSIS — I10 Essential (primary) hypertension: Secondary | ICD-10-CM | POA: Diagnosis not present

## 2014-08-10 DIAGNOSIS — Z96642 Presence of left artificial hip joint: Secondary | ICD-10-CM | POA: Diagnosis not present

## 2014-08-10 DIAGNOSIS — G250 Essential tremor: Secondary | ICD-10-CM | POA: Diagnosis not present

## 2014-08-10 DIAGNOSIS — F329 Major depressive disorder, single episode, unspecified: Secondary | ICD-10-CM | POA: Diagnosis not present

## 2014-08-10 DIAGNOSIS — Z471 Aftercare following joint replacement surgery: Secondary | ICD-10-CM | POA: Diagnosis not present

## 2014-08-10 DIAGNOSIS — M810 Age-related osteoporosis without current pathological fracture: Secondary | ICD-10-CM | POA: Diagnosis not present

## 2014-08-13 DIAGNOSIS — G250 Essential tremor: Secondary | ICD-10-CM | POA: Diagnosis not present

## 2014-08-13 DIAGNOSIS — F329 Major depressive disorder, single episode, unspecified: Secondary | ICD-10-CM | POA: Diagnosis not present

## 2014-08-13 DIAGNOSIS — M810 Age-related osteoporosis without current pathological fracture: Secondary | ICD-10-CM | POA: Diagnosis not present

## 2014-08-13 DIAGNOSIS — Z96642 Presence of left artificial hip joint: Secondary | ICD-10-CM | POA: Diagnosis not present

## 2014-08-13 DIAGNOSIS — I10 Essential (primary) hypertension: Secondary | ICD-10-CM | POA: Diagnosis not present

## 2014-08-13 DIAGNOSIS — Z471 Aftercare following joint replacement surgery: Secondary | ICD-10-CM | POA: Diagnosis not present

## 2014-08-15 ENCOUNTER — Other Ambulatory Visit: Payer: Medicare Other

## 2014-08-15 DIAGNOSIS — F329 Major depressive disorder, single episode, unspecified: Secondary | ICD-10-CM | POA: Diagnosis not present

## 2014-08-15 DIAGNOSIS — I10 Essential (primary) hypertension: Secondary | ICD-10-CM | POA: Diagnosis not present

## 2014-08-15 DIAGNOSIS — Z471 Aftercare following joint replacement surgery: Secondary | ICD-10-CM | POA: Diagnosis not present

## 2014-08-15 DIAGNOSIS — Z96642 Presence of left artificial hip joint: Secondary | ICD-10-CM | POA: Diagnosis not present

## 2014-08-15 DIAGNOSIS — M810 Age-related osteoporosis without current pathological fracture: Secondary | ICD-10-CM | POA: Diagnosis not present

## 2014-08-15 DIAGNOSIS — G250 Essential tremor: Secondary | ICD-10-CM | POA: Diagnosis not present

## 2014-08-16 ENCOUNTER — Other Ambulatory Visit: Payer: Self-pay | Admitting: Nurse Practitioner

## 2014-08-20 DIAGNOSIS — G250 Essential tremor: Secondary | ICD-10-CM | POA: Diagnosis not present

## 2014-08-20 DIAGNOSIS — Z471 Aftercare following joint replacement surgery: Secondary | ICD-10-CM | POA: Diagnosis not present

## 2014-08-20 DIAGNOSIS — F329 Major depressive disorder, single episode, unspecified: Secondary | ICD-10-CM | POA: Diagnosis not present

## 2014-08-20 DIAGNOSIS — Z96642 Presence of left artificial hip joint: Secondary | ICD-10-CM | POA: Diagnosis not present

## 2014-08-20 DIAGNOSIS — M810 Age-related osteoporosis without current pathological fracture: Secondary | ICD-10-CM | POA: Diagnosis not present

## 2014-08-20 DIAGNOSIS — I10 Essential (primary) hypertension: Secondary | ICD-10-CM | POA: Diagnosis not present

## 2014-08-22 ENCOUNTER — Ambulatory Visit: Payer: Medicare Other

## 2014-08-23 DIAGNOSIS — G250 Essential tremor: Secondary | ICD-10-CM | POA: Diagnosis not present

## 2014-08-23 DIAGNOSIS — Z471 Aftercare following joint replacement surgery: Secondary | ICD-10-CM | POA: Diagnosis not present

## 2014-08-23 DIAGNOSIS — M810 Age-related osteoporosis without current pathological fracture: Secondary | ICD-10-CM | POA: Diagnosis not present

## 2014-08-23 DIAGNOSIS — F329 Major depressive disorder, single episode, unspecified: Secondary | ICD-10-CM | POA: Diagnosis not present

## 2014-08-23 DIAGNOSIS — Z96642 Presence of left artificial hip joint: Secondary | ICD-10-CM | POA: Diagnosis not present

## 2014-08-23 DIAGNOSIS — I10 Essential (primary) hypertension: Secondary | ICD-10-CM | POA: Diagnosis not present

## 2014-08-30 DIAGNOSIS — I10 Essential (primary) hypertension: Secondary | ICD-10-CM | POA: Diagnosis not present

## 2014-08-30 DIAGNOSIS — M810 Age-related osteoporosis without current pathological fracture: Secondary | ICD-10-CM | POA: Diagnosis not present

## 2014-08-30 DIAGNOSIS — Z471 Aftercare following joint replacement surgery: Secondary | ICD-10-CM | POA: Diagnosis not present

## 2014-08-30 DIAGNOSIS — Z96642 Presence of left artificial hip joint: Secondary | ICD-10-CM | POA: Diagnosis not present

## 2014-08-30 DIAGNOSIS — F329 Major depressive disorder, single episode, unspecified: Secondary | ICD-10-CM | POA: Diagnosis not present

## 2014-08-30 DIAGNOSIS — G250 Essential tremor: Secondary | ICD-10-CM | POA: Diagnosis not present

## 2014-08-31 ENCOUNTER — Other Ambulatory Visit: Payer: Self-pay | Admitting: Nurse Practitioner

## 2014-09-02 ENCOUNTER — Encounter: Payer: Self-pay | Admitting: Nurse Practitioner

## 2014-09-03 DIAGNOSIS — G250 Essential tremor: Secondary | ICD-10-CM | POA: Diagnosis not present

## 2014-09-03 DIAGNOSIS — F329 Major depressive disorder, single episode, unspecified: Secondary | ICD-10-CM | POA: Diagnosis not present

## 2014-09-03 DIAGNOSIS — Z471 Aftercare following joint replacement surgery: Secondary | ICD-10-CM | POA: Diagnosis not present

## 2014-09-03 DIAGNOSIS — M810 Age-related osteoporosis without current pathological fracture: Secondary | ICD-10-CM | POA: Diagnosis not present

## 2014-09-03 DIAGNOSIS — Z96642 Presence of left artificial hip joint: Secondary | ICD-10-CM | POA: Diagnosis not present

## 2014-09-03 DIAGNOSIS — I10 Essential (primary) hypertension: Secondary | ICD-10-CM | POA: Diagnosis not present

## 2014-09-10 NOTE — Telephone Encounter (Signed)
This message is being addressed by management: Barbette Or.Rosana Hoes

## 2014-09-19 ENCOUNTER — Encounter: Payer: Self-pay | Admitting: Nurse Practitioner

## 2014-10-08 ENCOUNTER — Encounter: Payer: Self-pay | Admitting: Nurse Practitioner

## 2014-10-15 ENCOUNTER — Encounter: Payer: Self-pay | Admitting: Nurse Practitioner

## 2014-10-19 ENCOUNTER — Telehealth: Payer: Self-pay | Admitting: Nurse Practitioner

## 2014-10-19 NOTE — Telephone Encounter (Signed)
See previous message....cdavis

## 2014-10-19 NOTE — Telephone Encounter (Signed)
Spoke with pts daughter, told her i would mail her of the POA, notes for the patient and her husband, with comments that may help prove her case...cdavis

## 2014-11-06 ENCOUNTER — Other Ambulatory Visit: Payer: Self-pay | Admitting: *Deleted

## 2014-11-06 MED ORDER — LOSARTAN POTASSIUM 50 MG PO TABS: ORAL_TABLET | ORAL | Status: DC

## 2014-11-06 MED ORDER — GABAPENTIN 300 MG PO CAPS: ORAL_CAPSULE | ORAL | Status: DC

## 2014-11-06 NOTE — Telephone Encounter (Signed)
[facility] 

## 2014-12-17 ENCOUNTER — Telehealth: Payer: Self-pay | Admitting: Hematology

## 2014-12-17 NOTE — Telephone Encounter (Signed)
returned pt call re appt. former CP1 pt given an appt w/YF for 4/11 lb/YF @ 1pm. appts rescheduled from nov 2015.

## 2014-12-25 DIAGNOSIS — L821 Other seborrheic keratosis: Secondary | ICD-10-CM | POA: Diagnosis not present

## 2014-12-25 DIAGNOSIS — L814 Other melanin hyperpigmentation: Secondary | ICD-10-CM | POA: Diagnosis not present

## 2014-12-25 DIAGNOSIS — L570 Actinic keratosis: Secondary | ICD-10-CM | POA: Diagnosis not present

## 2014-12-25 DIAGNOSIS — L820 Inflamed seborrheic keratosis: Secondary | ICD-10-CM | POA: Diagnosis not present

## 2014-12-25 DIAGNOSIS — D1801 Hemangioma of skin and subcutaneous tissue: Secondary | ICD-10-CM | POA: Diagnosis not present

## 2014-12-25 DIAGNOSIS — D225 Melanocytic nevi of trunk: Secondary | ICD-10-CM | POA: Diagnosis not present

## 2015-01-01 DIAGNOSIS — Z1231 Encounter for screening mammogram for malignant neoplasm of breast: Secondary | ICD-10-CM | POA: Diagnosis not present

## 2015-01-01 LAB — HM MAMMOGRAPHY: HM Mammogram: NORMAL

## 2015-01-02 ENCOUNTER — Other Ambulatory Visit: Payer: Self-pay | Admitting: Nurse Practitioner

## 2015-01-02 ENCOUNTER — Other Ambulatory Visit: Payer: Self-pay | Admitting: *Deleted

## 2015-01-14 ENCOUNTER — Encounter: Payer: Self-pay | Admitting: Nurse Practitioner

## 2015-01-14 ENCOUNTER — Ambulatory Visit (INDEPENDENT_AMBULATORY_CARE_PROVIDER_SITE_OTHER): Payer: Medicare Other | Admitting: Nurse Practitioner

## 2015-01-14 VITALS — BP 120/68 | HR 76 | Temp 97.8°F | Resp 18 | Ht 66.0 in | Wt 126.8 lb

## 2015-01-14 DIAGNOSIS — M545 Low back pain, unspecified: Secondary | ICD-10-CM

## 2015-01-14 DIAGNOSIS — I10 Essential (primary) hypertension: Secondary | ICD-10-CM | POA: Diagnosis not present

## 2015-01-14 DIAGNOSIS — G629 Polyneuropathy, unspecified: Secondary | ICD-10-CM | POA: Diagnosis not present

## 2015-01-14 DIAGNOSIS — N3946 Mixed incontinence: Secondary | ICD-10-CM

## 2015-01-14 DIAGNOSIS — H3531 Nonexudative age-related macular degeneration: Secondary | ICD-10-CM | POA: Diagnosis not present

## 2015-01-14 DIAGNOSIS — F419 Anxiety disorder, unspecified: Secondary | ICD-10-CM

## 2015-01-14 DIAGNOSIS — H40013 Open angle with borderline findings, low risk, bilateral: Secondary | ICD-10-CM | POA: Diagnosis not present

## 2015-01-14 DIAGNOSIS — F418 Other specified anxiety disorders: Secondary | ICD-10-CM

## 2015-01-14 DIAGNOSIS — G589 Mononeuropathy, unspecified: Secondary | ICD-10-CM | POA: Diagnosis not present

## 2015-01-14 DIAGNOSIS — Z961 Presence of intraocular lens: Secondary | ICD-10-CM | POA: Diagnosis not present

## 2015-01-14 DIAGNOSIS — M1990 Unspecified osteoarthritis, unspecified site: Secondary | ICD-10-CM | POA: Diagnosis not present

## 2015-01-14 DIAGNOSIS — F329 Major depressive disorder, single episode, unspecified: Secondary | ICD-10-CM

## 2015-01-14 DIAGNOSIS — H35033 Hypertensive retinopathy, bilateral: Secondary | ICD-10-CM | POA: Diagnosis not present

## 2015-01-14 MED ORDER — METOPROLOL TARTRATE 25 MG PO TABS: 25.0000 mg | ORAL_TABLET | Freq: Every day | ORAL | Status: DC

## 2015-01-14 MED ORDER — LOSARTAN POTASSIUM 50 MG PO TABS: 50.0000 mg | ORAL_TABLET | Freq: Every day | ORAL | Status: DC

## 2015-01-14 MED ORDER — VENLAFAXINE HCL ER 37.5 MG PO CP24: 37.5000 mg | ORAL_CAPSULE | Freq: Every day | ORAL | Status: DC

## 2015-01-14 MED ORDER — GABAPENTIN 300 MG PO CAPS: ORAL_CAPSULE | ORAL | Status: DC

## 2015-01-14 NOTE — Patient Instructions (Signed)
Follow up in 6 months with Dr Nyoka Cowden.  Sooner if needed

## 2015-01-14 NOTE — Progress Notes (Signed)
Patient ID: Denise Lane, female   DOB: 06-Jun-1930, 81 y.o.   MRN: [id number]    PCP: Lauree Chandler, NP  Allergies  Allergen Reactions  . Codeine Nausea And Vomiting  . Dilaudid [Hydromorphone Hcl] Hives and Other (See Comments)    "whelps" (10/18/2012)  . Macrodantin Hives and Other (See Comments)    "drug fever; chills; felt terrible" (10/18/2012)  . Morphine And Related Hives and Nausea And Vomiting  . Sulfa Antibiotics Hives, Rash and Other (See Comments)    "got real sick" (10/18/2012)    Chief Complaint  Patient presents with  . Medical Management of Chronic Issues     HPI: Patient is a 81 y.o. female seen in the office today to follow up on chronic conditions. Pt with hx of OA, HTN, hemochromatosis, OAB, constipation. Pt with Left total hip arthroplasty in October.  Reports no pain in either hips. Worsening back pain, Dr Mardelle Matte gave her back brace. Taking Tylenol for pain  Down to 1 gabapentin at night, which helps.  Following up with oncologist next week Off all medications for bowel, no constipation  Advanced Directive information Does patient have an advance directive?: Yes Review of Systems:  Review of Systems  Constitutional: Negative for fever and chills.  HENT: Negative for congestion, ear discharge, ear pain, nosebleeds and sore throat.   Respiratory: Negative for cough, shortness of breath and wheezing.   Cardiovascular: Negative for chest pain, palpitations and leg swelling.  Gastrointestinal: Negative for nausea, vomiting, diarrhea, constipation and blood in stool.  Genitourinary: Positive for frequency (with urinary incontience). Negative for dysuria, urgency and hematuria.  Musculoskeletal: Positive for back pain. Negative for myalgias.  Neurological: Negative for dizziness, weakness and headaches.  Psychiatric/Behavioral:       Anxiety and depressive symptoms have improved since on Effexor     Past Medical History  Diagnosis Date  .  Hemochromatosis 1987  . Stenosis of esophagus   . Dysphagia   . Muscle pain   . History of colonic polyps     NOTED 11-20-09 IN COLONOSCOPY REPORT  . Diverticulosis of sigmoid colon 11-20-2009  . Hemorrhoids, internal 11-20-09  . Hyperlipidemia     no meds required  . Tremor, essential 10-29-09  . Hemochromatosis   . PONV (postoperative nausea and vomiting)   . History of bronchitis 1991-1996    "chronic; related to winter" (10/18/2012)  . History of blood transfusion     "S/P colonoscopy after polyp removed; got 2 units; esophageal bleed got 1 unit; really messed up hemochromatosis" (1/7/20214)  . Occasional tremors   . Joint pain   . Joint swelling   . Bruises easily   . H/O hiatal hernia   . Gastric ulcer   . Vomiting     occasionally  . Urinary urgency     takes Ditropan daily  . Osteoporosis   . Dry eyes     uses Refresh eye drops daily as needed  . Rheumatic fever     hx of  . Osteoarthritis   . Osteoarthritis of right hip 10/18/2012  . Chronic lower back pain   . Basal cell cancer     "face, legs" (10/18/2012)  . Squamous carcinoma     "LLE" (10/18/2012)  . Hypertension     takes Metoprolol and Losartan daily  . Depression     takes Effexor daily  . GERD (gastroesophageal reflux disease)     takes Omeprazole daily  . Overactive bladder  takes Myrbetriq daily  . Muscle spasm     takes Robaxin daily as needed   Past Surgical History  Procedure Laterality Date  . Tonsillectomy  1953  . Appendectomy  1952  . Cholecystectomy  1977  . Tubal ligation  1975  . Lumbar disc surgery  1989  . Shoulder arthroscopy w/ rotator cuff repair  1998?    "left" (10/18/2012)  . Dilation and curettage of uterus  1975  . Cataract extraction w/ intraocular lens  implant, bilateral  2001    "both eyes" (10/18/2012)  . Esophagogastroduodenoscopy    . Colonoscopy    . Total hip arthroplasty  10/18/2012    "right" (10/18/2012)  . Skin cancer excision      "multiple" (10/18/2012)  . Total hip  arthroplasty  10/18/2012    Procedure: TOTAL HIP ARTHROPLASTY;  Surgeon: Johnny Bridge, MD;  Location: [facility];  Service: Orthopedics;  Laterality: Right;  . Total hip arthroplasty Left 07/24/2014    dr Mardelle Matte  . Total hip arthroplasty Left 07/24/2014    Procedure: LEFT TOTAL HIP ARTHROPLASTY;  Surgeon: Johnny Bridge, MD;  Location: [facility];  Service: Orthopedics;  Laterality: Left;   Social History:   reports that she has never smoked. She has never used smokeless tobacco. She reports that she does not drink alcohol or use illicit drugs.  Family History  Problem Relation Age of Onset  . Hyperlipidemia Daughter     Medications: Patient's Medications  New Prescriptions   No medications on file  Previous Medications   ACETAMINOPHEN (TYLENOL) 650 MG CR TABLET    Take 650 mg by mouth 2 (two) times daily.    CALCIUM CITRATE (CALCITRATE - DOSED IN MG ELEMENTAL CALCIUM) 950 MG TABLET    Take 1-2 tablets by mouth 3 (three) times daily. 2 tablets in am and 1 tablet with lunch and supper   CARBOXYMETHYLCELLULOSE (REFRESH PLUS) 0.5 % SOLN    Place 1 drop into both eyes 3 (three) times daily as needed. For dry eyes   CHOLECALCIFEROL (VITAMIN D) 1000 UNITS TABLET    Take 1,000 Units by mouth 2 (two) times daily.   MIRABEGRON ER (MYRBETRIQ) 25 MG TB24 TABLET    Take 1 tablet (25 mg total) by mouth 2 (two) times daily.   OMEPRAZOLE (PRILOSEC) 20 MG CAPSULE    Take 20 mg by mouth daily.  Modified Medications   Modified Medication Previous Medication   GABAPENTIN (NEURONTIN) 300 MG CAPSULE gabapentin (NEURONTIN) 300 MG capsule      Take one capsule by mouth every 8 hours as needed for nerve pain    Take one capsule by mouth every 8 hours as needed for nerve pain   LOSARTAN (COZAAR) 50 MG TABLET losartan (COZAAR) 50 MG tablet      Take 1 tablet (50 mg total) by mouth daily.    TAKE ONE TABLET BY MOUTH ONCE DAILY FOR BLOOD PRESSURE   METOPROLOL TARTRATE (LOPRESSOR) 25 MG TABLET metoprolol tartrate  (LOPRESSOR) 25 MG tablet      Take 1 tablet (25 mg total) by mouth daily.    Take 1 tablet (25 mg total) by mouth daily.   VENLAFAXINE XR (EFFEXOR-XR) 37.5 MG 24 HR CAPSULE venlafaxine XR (EFFEXOR-XR) 37.5 MG 24 hr capsule      Take 1 capsule (37.5 mg total) by mouth daily with breakfast.    TAKE ONE CAPSULE BY MOUTH ONCE DAILY WITH  BREAKFAST  Discontinued Medications   LUBIPROSTONE (AMITIZA) 24 MCG  CAPSULE    Take 1 capsule (24 mcg total) by mouth 2 (two) times daily with a meal.   METHOCARBAMOL (ROBAXIN) 500 MG TABLET    Take 1 tablet (500 mg total) by mouth every 6 (six) hours as needed for muscle spasms.   ONDANSETRON (ZOFRAN) 4 MG TABLET    Take 1 tablet (4 mg total) by mouth every 8 (eight) hours as needed for nausea or vomiting.   OXYCODONE-ACETAMINOPHEN (ROXICET) 5-325 MG PER TABLET    Take 1-2 tablets by mouth every 6 (six) hours as needed for severe pain.   RIVAROXABAN (XARELTO) 10 MG TABS TABLET    Take 1 tablet (10 mg total) by mouth daily.   SENNOSIDES-DOCUSATE SODIUM (SENOKOT-S) 8.6-50 MG TABLET    Take 2 tablets by mouth daily.   TRAMADOL (ULTRAM) 50 MG TABLET    Take 50-100 mg by mouth every 6 (six) hours as needed for moderate pain.   VENLAFAXINE XR (EFFEXOR-XR) 37.5 MG 24 HR CAPSULE    Take 37.5 mg by mouth daily with breakfast.     Physical Exam:  Filed Vitals:   01/14/15 1337  BP: 120/68  Pulse: 76  Temp: 97.8 F (36.6 C)  TempSrc: Oral  Resp: 18  Height: 5\' 6"  (1.676 m)  Weight: 126 lb 12.8 oz (57.516 kg)  SpO2: 94%    Physical Exam  Constitutional: She is oriented to person, place, and time. She appears well-developed and well-nourished.  HENT:  Head: Normocephalic and atraumatic.  Eyes: Conjunctivae and EOM are normal. Pupils are equal, round, and reactive to light.  Neck: Normal range of motion. Neck supple.  Cardiovascular: Normal rate, regular rhythm, normal heart sounds and intact distal pulses.   Pulmonary/Chest: Effort normal and breath sounds  normal.  Abdominal: Soft. Bowel sounds are normal.  Musculoskeletal: She exhibits no edema or tenderness.  Neurological: She is alert and oriented to person, place, and time.  Skin: Skin is warm and dry.  Psychiatric: She has a normal mood and affect. Her behavior is normal. Thought content normal.    Labs reviewed: Basic Metabolic Panel:  Recent Labs  02/08/14 1128  07/18/14 1238 07/25/14 0553 07/26/14 0503  NA 137  < > 139 137 137  K 4.2  < > 4.0 4.1 3.4*  CL 95*  < > 100 100 99  CO2 28  < > 30 27 27   GLUCOSE 97  < > 119* 132* 132*  BUN 32*  < > 23 14 13   CREATININE 0.71  < > 0.58 0.78 0.62  CALCIUM 10.0  < > 9.8 8.6 8.3*  TSH 1.800  --   --   --   --   < > = values in this interval not displayed. Liver Function Tests:  Recent Labs  02/12/14 0956 04/07/14 1020 07/12/14 1544  AST 16 14 13   ALT 8 13 8   ALKPHOS 84 120* 113  BILITOT 0.63 0.6 0.3  PROT 6.4 6.3 6.3  ALBUMIN 3.6 3.0*  --    No results for input(s): LIPASE, AMYLASE in the last 8760 hours. No results for input(s): AMMONIA in the last 8760 hours. CBC:  Recent Labs  02/12/14 0956 04/06/14 2354  07/12/14 1544  07/25/14 0553 07/26/14 0503 07/27/14 0438 07/28/14 0355  WBC 8.0 12.9*  < > 7.4  < > 8.2 10.3 9.3  --   NEUTROABS 4.9 10.2*  --  4.1  --   --   --   --   --  HGB 12.5 11.8*  < > 12.4  < > 9.2* 8.0* 6.9* 9.7*  HCT 36.7 36.1  < > 36.7  < > 27.3* 23.5* 19.9* 28.7*  MCV 96.3 97.6  < > 95  < > 93.5 93.3 93.9  --   PLT 281 260  < > 280  < > 240 195 182  --   < > = values in this interval not displayed. Lipid Panel: No results for input(s): CHOL, HDL, LDLCALC, TRIG, CHOLHDL, LDLDIRECT in the last 8760 hours. TSH:  Recent Labs  02/08/14 1128  TSH 1.800   A1C: No results found for: HGBA1C   Assessment/Plan  1. Essential hypertension Controlled on current regimen  - Comprehensive metabolic panel - metoprolol tartrate (LOPRESSOR) 25 MG tablet; Take 1 tablet (25 mg total) by mouth  daily.  Dispense: 90 tablet; Refill: 3 - losartan (COZAAR) 50 MG tablet; Take 1 tablet (50 mg total) by mouth daily.  Dispense: 90 tablet; Refill: 3  2. Neuropathy -overall has improved greatly  - CBC with Differential/Platelet - gabapentin (NEURONTIN) 300 MG capsule; Take one capsule by mouth every 8 hours as needed for nerve pain  Dispense: 180 capsule; Refill: 3  3. Osteoarthritis, unspecified osteoarthritis type, unspecified site Bilateral hip pain much improved after surgery, pt off all medications for pain. Good mobility.  4. Hemochromatosis - has oncology follow up next week, will follow up CBC today  5. 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  Dispense: 90 capsule; Refill: 3  6. Mixed incontinence Currently on mybetriq 25 mg daily with good results  7. Bilateral low back pain without sciatica Back pain in primary source of pain, following with ortho, back brace has been helpful -uses tylenol as needed  Follow up in 6 months

## 2015-01-15 LAB — CBC WITH DIFFERENTIAL/PLATELET
Basophils Absolute: 0 10*3/uL (ref 0.0–0.2)
Basos: 0 %
EOS ABS: 0.2 10*3/uL (ref 0.0–0.4)
EOS: 2 %
HCT: 39.3 % (ref 34.0–46.6)
HEMOGLOBIN: 13.1 g/dL (ref 11.1–15.9)
IMMATURE GRANS (ABS): 0 10*3/uL (ref 0.0–0.1)
Immature Granulocytes: 0 %
LYMPHS: 37 %
Lymphocytes Absolute: 2.6 10*3/uL (ref 0.7–3.1)
MCH: 32.3 pg (ref 26.6–33.0)
MCHC: 33.3 g/dL (ref 31.5–35.7)
MCV: 97 fL (ref 79–97)
MONOCYTES: 11 %
Monocytes Absolute: 0.7 10*3/uL (ref 0.1–0.9)
NEUTROS ABS: 3.4 10*3/uL (ref 1.4–7.0)
NEUTROS PCT: 50 %
Platelets: 315 10*3/uL (ref 150–379)
RBC: 4.06 x10E6/uL (ref 3.77–5.28)
RDW: 13.3 % (ref 12.3–15.4)
WBC: 6.9 10*3/uL (ref 3.4–10.8)

## 2015-01-15 LAB — COMPREHENSIVE METABOLIC PANEL
ALK PHOS: 98 IU/L (ref 39–117)
ALT: 12 IU/L (ref 0–32)
AST: 12 IU/L (ref 0–40)
Albumin/Globulin Ratio: 1.8 (ref 1.1–2.5)
Albumin: 4.1 g/dL (ref 3.5–4.7)
BILIRUBIN TOTAL: 0.4 mg/dL (ref 0.0–1.2)
BUN / CREAT RATIO: 30 — AB (ref 11–26)
BUN: 20 mg/dL (ref 8–27)
CHLORIDE: 101 mmol/L (ref 97–108)
CO2: 27 mmol/L (ref 18–29)
Calcium: 9.6 mg/dL (ref 8.7–10.3)
Creatinine, Ser: 0.67 mg/dL (ref 0.57–1.00)
GFR calc non Af Amer: 82 mL/min/{1.73_m2} (ref >59)
GFR, EST AFRICAN AMERICAN: 94 mL/min/{1.73_m2} (ref >59)
Globulin, Total: 2.3 g/dL (ref 1.5–4.5)
Glucose: 116 mg/dL — ABNORMAL HIGH (ref 65–99)
POTASSIUM: 3.9 mmol/L (ref 3.5–5.2)
Sodium: 142 mmol/L (ref 134–144)
Total Protein: 6.4 g/dL (ref 6.0–8.5)

## 2015-01-17 ENCOUNTER — Other Ambulatory Visit: Payer: Self-pay | Admitting: *Deleted

## 2015-01-21 ENCOUNTER — Other Ambulatory Visit (HOSPITAL_BASED_OUTPATIENT_CLINIC_OR_DEPARTMENT_OTHER): Payer: Medicare Other

## 2015-01-21 ENCOUNTER — Encounter: Payer: Self-pay | Admitting: Hematology

## 2015-01-21 ENCOUNTER — Ambulatory Visit (HOSPITAL_BASED_OUTPATIENT_CLINIC_OR_DEPARTMENT_OTHER): Payer: Medicare Other | Admitting: Hematology

## 2015-01-21 ENCOUNTER — Telehealth: Payer: Self-pay | Admitting: Hematology

## 2015-01-21 LAB — CBC WITH DIFFERENTIAL/PLATELET
BASO%: 0.4 % (ref 0.0–2.0)
BASOS ABS: 0 10*3/uL (ref 0.0–0.1)
EOS ABS: 0.1 10*3/uL (ref 0.0–0.5)
EOS%: 1.9 % (ref 0.0–7.0)
HCT: 38 % (ref 34.8–46.6)
HGB: 12.8 g/dL (ref 11.6–15.9)
LYMPH#: 2.4 10*3/uL (ref 0.9–3.3)
LYMPH%: 34.9 % (ref 14.0–49.7)
MCH: 32.5 pg (ref 25.1–34.0)
MCHC: 33.6 g/dL (ref 31.5–36.0)
MCV: 96.7 fL (ref 79.5–101.0)
MONO#: 0.6 10*3/uL (ref 0.1–0.9)
MONO%: 9.2 % (ref 0.0–14.0)
NEUT#: 3.7 10*3/uL (ref 1.5–6.5)
NEUT%: 53.6 % (ref 38.4–76.8)
Platelets: 272 10*3/uL (ref 145–400)
RBC: 3.93 10*6/uL (ref 3.70–5.45)
RDW: 12.3 % (ref 11.2–14.5)
WBC: 6.9 10*3/uL (ref 3.9–10.3)

## 2015-01-21 LAB — FERRITIN CHCC: FERRITIN: 79 ng/mL (ref 9–269)

## 2015-01-21 NOTE — Telephone Encounter (Signed)
Left message to confirm appointment for October.Mailed calendar. °

## 2015-01-21 NOTE — Progress Notes (Addendum)
[facility] OFFICE PROGRESS NOTE  Lauree Chandler, NP [address] [place] [address]  DIAGNOSIS: Hemochromatosis  Chief Complaint  Patient presents with  . Follow-up    hemochromatosis    CURRENT TREATMENT: Prn phlebotomy to keep ferritin <=50   INTERVAL HISTORY: Denise Lane 81 y.o. female with a history of hemochromatosis is here for follow up. She has been doing well since her last visit with Dr. Juliann Mule in 11 months ago. She has hip arthritis and had b/l hip surgery last year. She has recovered well. She complains moderate fatigue, although is able to do daily activities. She denies any chest pain, or other symptoms.   MEDICAL HISTORY: Past Medical History  Diagnosis Date  . Hemochromatosis 1987  . Stenosis of esophagus   . Dysphagia   . Muscle pain   . History of colonic polyps     NOTED 11-20-09 IN COLONOSCOPY REPORT  . Diverticulosis of sigmoid colon 11-20-2009  . Hemorrhoids, internal 11-20-09  . Hyperlipidemia     no meds required  . Tremor, essential 10-29-09  . Hemochromatosis   . PONV (postoperative nausea and vomiting)   . History of bronchitis 1991-1996    "chronic; related to winter" (10/18/2012)  . History of blood transfusion     "S/P colonoscopy after polyp removed; got 2 units; esophageal bleed got 1 unit; really messed up hemochromatosis" (1/7/20214)  . Occasional tremors   . Joint pain   . Joint swelling   . Bruises easily   . H/O hiatal hernia   . Gastric ulcer   . Vomiting     occasionally  . Urinary urgency     takes Ditropan daily  . Osteoporosis   . Dry eyes     uses Refresh eye drops daily as needed  . Rheumatic fever     hx of  . Osteoarthritis   . Osteoarthritis of right hip 10/18/2012  . Chronic lower back pain   . Basal cell cancer     "face, legs" (10/18/2012)  . Squamous carcinoma     "LLE" (10/18/2012)  . Hypertension     takes Metoprolol and Losartan daily  . Depression     takes Effexor daily  . GERD  (gastroesophageal reflux disease)     takes Omeprazole daily  . Overactive bladder     takes Myrbetriq daily  . Muscle spasm     takes Robaxin daily as needed    INTERIM HISTORY: has Hemochromatosis; Osteoarthritis of right hip; Muscle pain; Hyperlipidemia; Hypertension; Urinary incontinence; Back pain; Osteoarthritis; Neuropathy; Anxiety and depression; Osteoarthritis of left hip; Right thigh pain; Periprosthetic hip fracture; Hip fracture; and Postoperative anemia due to acute blood loss on her problem list.    ALLERGIES:  is allergic to codeine; dilaudid; macrodantin; morphine and related; and sulfa antibiotics.  MEDICATIONS: has a current medication list which includes the following prescription(s): acetaminophen, calcium citrate, carboxymethylcellulose, cholecalciferol, gabapentin, losartan, metoprolol tartrate, mirabegron er, omeprazole, and venlafaxine xr.  SURGICAL HISTORY:  Past Surgical History  Procedure Laterality Date  . Tonsillectomy  1953  . Appendectomy  1952  . Cholecystectomy  1977  . Tubal ligation  1975  . Lumbar disc surgery  1989  . Shoulder arthroscopy w/ rotator cuff repair  1998?    "left" (10/18/2012)  . Dilation and curettage of uterus  1975  . Cataract extraction w/ intraocular lens  implant, bilateral  2001    "both eyes" (10/18/2012)  . Esophagogastroduodenoscopy    .  Colonoscopy    . Total hip arthroplasty  10/18/2012    "right" (10/18/2012)  . Skin cancer excision      "multiple" (10/18/2012)  . Total hip arthroplasty  10/18/2012    Procedure: TOTAL HIP ARTHROPLASTY;  Surgeon: Johnny Bridge, MD;  Location: [facility];  Service: Orthopedics;  Laterality: Right;  . Total hip arthroplasty Left 07/24/2014    dr Mardelle Matte  . Total hip arthroplasty Left 07/24/2014    Procedure: LEFT TOTAL HIP ARTHROPLASTY;  Surgeon: Johnny Bridge, MD;  Location: [facility];  Service: Orthopedics;  Laterality: Left;    REVIEW OF SYSTEMS:   Constitutional: Denies fevers, chills or  abnormal weight loss Eyes: Denies blurriness of vision Ears, nose, mouth, throat, and face: Denies mucositis or sore throat Respiratory: Denies cough, dyspnea or wheezes Cardiovascular: Denies palpitation, chest discomfort or lower extremity swelling Gastrointestinal:  Denies nausea, heartburn or change in bowel habits Skin: Denies abnormal skin rashes Lymphatics: Denies new lymphadenopathy or easy bruising Neurological:Denies numbness, tingling or new weaknesses Behavioral/Psych: Mood is stable, no new changes  All other systems were reviewed with the patient and are negative.  PHYSICAL EXAMINATION: ECOG PERFORMANCE STATUS: 1 - Symptomatic but completely ambulatory  Blood pressure 154/56, pulse 69, temperature 98.4 F (36.9 C), temperature source Oral, resp. rate 18, height 5\' 6"  (1.676 m), weight 124 lb 14.4 oz (56.654 kg), SpO2 98 %.  GENERAL:alert, no distress and comfortable; elderly, thin and ambulates with a cane SKIN: skin color, texture, turgor are normal, no rashes or significant lesions EYES: normal, Conjunctiva are pink and non-injected, sclera clear OROPHARYNX:no exudate, no erythema and lips, buccal mucosa, and tongue normal  NECK: supple, thyroid normal size, non-tender, without nodularity LYMPH:  no palpable lymphadenopathy in the cervical, axillary or supraclavicular LUNGS: clear to auscultation with normal breathing effort, no wheezes or rhonchi HEART: regular rate & rhythm and no murmurs and no lower extremity edema ABDOMEN:abdomen soft, non-tender and normal bowel sounds Musculoskeletal:no cyanosis of digits and no clubbing;Stable arthritic changes to fingers bilaterally NEURO: alert & oriented x 3 with fluent speech, no focal motor/sensory deficits  Labs:  CBC Latest Ref Rng 01/21/2015 01/14/2015 07/28/2014  WBC 3.9 - 10.3 10e3/uL 6.9 6.9 -  Hemoglobin 11.6 - 15.9 g/dL 12.8 13.1 9.7(L)  Hematocrit 34.8 - 46.6 % 38.0 39.3 28.7(L)  Platelets 145 - 400 10e3/uL 272  315 -    CMP Latest Ref Rng 01/14/2015 07/26/2014 07/25/2014  Glucose 65 - 99 mg/dL 116(H) 132(H) 132(H)  BUN 8 - 27 mg/dL 20 13 14   Creatinine 0.57 - 1.00 mg/dL 0.67 0.62 0.78  Sodium 134 - 144 mmol/L 142 137 137  Potassium 3.5 - 5.2 mmol/L 3.9 3.4(L) 4.1  Chloride 97 - 108 mmol/L 101 99 100  CO2 18 - 29 mmol/L 27 27 27   Calcium 8.7 - 10.3 mg/dL 9.6 8.3(L) 8.6  Total Protein 6.0 - 8.5 g/dL 6.4 - -  Albumin 3.5 - 4.7 g/dL 4.1 - -  Total Bilirubin 0.0 - 1.2 mg/dL 0.4 - -  Alkaline Phos 39 - 117 IU/L 98 - -  AST 0 - 40 IU/L 12 - -  ALT 0 - 32 IU/L 12 - -     RADIOGRAPHIC STUDIES: No results found.  ASSESSMENT: Denise Lane 81 y.o. female with a history of Hemochromatosis   PLAN:  1.Hemachromatosis:  -She is clinically doing very well. -Continue her advanced age and symptomatic phlebotomy (presyncope ), I will only do phlebotomize if her ferritin >100, and  half unit each time.  -Her hematocrit is 38 today. Ferritin level is still pending. I'll call her tomorrow with ferritin level , and set up a phlebotomy. Needed.   Follow-up: Return to clinic in 6 months, lab with CBC and ferritin 1 week before visit.  All questions were answered. The patient knows to call the clinic with any problems, questions or concerns. We can certainly see the patient much sooner if necessary.  I spent 10 minutes counseling the patient face to face. The total time spent in the appointment was 15 minutes.    Truitt Merle, MD 01/21/2015 2:26 PM   Addendum: I called her today about her ferritin level 79. No need for phlebotomy this time.  I will see her back in 6 month.  Truitt Merle 01/22/2015

## 2015-02-19 DIAGNOSIS — Z86010 Personal history of colonic polyps: Secondary | ICD-10-CM | POA: Diagnosis not present

## 2015-05-30 ENCOUNTER — Other Ambulatory Visit: Payer: Self-pay | Admitting: Nurse Practitioner

## 2015-05-31 ENCOUNTER — Encounter: Payer: Self-pay | Admitting: Nurse Practitioner

## 2015-06-03 ENCOUNTER — Other Ambulatory Visit: Payer: Self-pay

## 2015-06-03 ENCOUNTER — Telehealth: Payer: Self-pay

## 2015-06-03 MED ORDER — OXYBUTYNIN CHLORIDE 5 MG PO TABS: 5.0000 mg | ORAL_TABLET | Freq: Two times a day (BID) | ORAL | Status: DC

## 2015-06-03 NOTE — Telephone Encounter (Signed)
Denise Lane spoke with patients daughter, she would like to make sure that you want to give her father the nameda because she said you told her that you were a little scared to switch him from the [place] XR to the [place]. Also what dose would you like me to order for Denise Lane Oxybutynin you have no instructions with it. Please advise

## 2015-06-03 NOTE — Telephone Encounter (Signed)
Spoke with Janett Billow and she agreed with switching Denise Lane, Denise Lane's medication from Myrbetriq  to Oxybutynin 5 mg and I should give them some samples of the Myrbetriq, and Nameda XR but to let the daughter know we cannot  keep giving her samples of these medication.  Spoke with daughter informed her of Jessica's orders and about the samples and she was fine with what she was told.

## 2015-07-09 DIAGNOSIS — Z23 Encounter for immunization: Secondary | ICD-10-CM | POA: Diagnosis not present

## 2015-07-16 ENCOUNTER — Ambulatory Visit (INDEPENDENT_AMBULATORY_CARE_PROVIDER_SITE_OTHER): Payer: Medicare Other | Admitting: Internal Medicine

## 2015-07-16 ENCOUNTER — Encounter: Payer: Self-pay | Admitting: Internal Medicine

## 2015-07-16 VITALS — BP 126/84 | HR 58 | Temp 97.6°F | Ht 66.0 in | Wt 125.0 lb

## 2015-07-16 DIAGNOSIS — R252 Cramp and spasm: Secondary | ICD-10-CM | POA: Diagnosis not present

## 2015-07-16 DIAGNOSIS — I10 Essential (primary) hypertension: Secondary | ICD-10-CM | POA: Diagnosis not present

## 2015-07-16 DIAGNOSIS — E785 Hyperlipidemia, unspecified: Secondary | ICD-10-CM | POA: Diagnosis not present

## 2015-07-16 DIAGNOSIS — G5762 Lesion of plantar nerve, left lower limb: Secondary | ICD-10-CM | POA: Insufficient documentation

## 2015-07-16 DIAGNOSIS — G629 Polyneuropathy, unspecified: Secondary | ICD-10-CM | POA: Diagnosis not present

## 2015-07-16 NOTE — Progress Notes (Signed)
Patient ID: Denise Lane, female   DOB: [birth year], 81 y.o.   MRN: [id number]    Facility  [facility]    Place of Service:   OFFICE    Allergies  Allergen Reactions  . Codeine Nausea And Vomiting  . Dilaudid [Hydromorphone Hcl] Hives and Other (See Comments)    "whelps" (10/18/2012)  . Macrodantin Hives and Other (See Comments)    "drug fever; chills; felt terrible" (10/18/2012)  . Morphine And Related Hives and Nausea And Vomiting  . Sulfa Antibiotics Hives, Rash and Other (See Comments)    "got real sick" (10/18/2012)    Chief Complaint  Patient presents with  . Medical Management of Chronic Issues    6 month follow-up     HPI:   Patient returns for routine checkup today. It is been a few years since I last saw Denise Lane. She seems to have done well in the interim.   Patient had a right total hip replacement January 2014. Did okay with that until June 2015 when she fell and cracked the right hip. Dr. Mardelle Matte attended to her during that period of time. With continued discomfort she had a replacement of the left hip 07/24/2014 with Dr. Mardelle Matte. She believes she has recovered from that well  Essential hypertension  - controlled  Cramp of both lower extremities  - nocturnal cramps  Morton's neuroma of left foot  - discomfort between the toes in the left foot including left 2-3, 3-4, and 4-5.  Hyperlipidemia  - follow-up lab needed  Neuropathy (HCC)  - non-feelings in the feet. Denies much pain.  Hemochromatosis  - needs follow-up of CBC and liver enzymes    Medications: Patient's Medications  New Prescriptions   No medications on file  Previous Medications   ACETAMINOPHEN (TYLENOL) 650 MG CR TABLET    Take 650 mg by mouth 2 (two) times daily.    CALCIUM CITRATE (CALCITRATE - DOSED IN MG ELEMENTAL CALCIUM) 950 MG TABLET    Take 1-2 tablets by mouth 3 (three) times daily. 2 tablets in am and 1 tablet with lunch and supper   CARBOXYMETHYLCELLULOSE (REFRESH PLUS) 0.5 % SOLN     Place 1 drop into both eyes 3 (three) times daily as needed. For dry eyes   CHOLECALCIFEROL (VITAMIN D) 1000 UNITS TABLET    Take 1,000 Units by mouth 2 (two) times daily.   GABAPENTIN (NEURONTIN) 300 MG CAPSULE    Take one capsule by mouth every 8 hours as needed for nerve pain   LOSARTAN (COZAAR) 50 MG TABLET    Take 1 tablet (50 mg total) by mouth daily.   METOPROLOL TARTRATE (LOPRESSOR) 25 MG TABLET    Take 1 tablet (25 mg total) by mouth daily.   OMEPRAZOLE (PRILOSEC) 20 MG CAPSULE    Take 20 mg by mouth daily.   OXYBUTYNIN (DITROPAN) 5 MG TABLET    Take 1 tablet (5 mg total) by mouth 2 (two) times daily.   VENLAFAXINE XR (EFFEXOR-XR) 37.5 MG 24 HR CAPSULE    Take 1 capsule (37.5 mg total) by mouth daily with breakfast.  Modified Medications   No medications on file  Discontinued Medications   MYRBETRIQ 25 MG TB24 TABLET    TAKE ONE TABLET BY MOUTH TWICE DAILY     Review of Systems  Constitutional: Negative for fever, chills, diaphoresis, activity change, appetite change, fatigue and unexpected weight change.  HENT: Negative for congestion, ear discharge, ear pain, hearing loss, nosebleeds, postnasal drip, rhinorrhea,  sore throat, tinnitus, trouble swallowing and voice change.   Eyes: Negative for pain, redness, itching and visual disturbance.  Respiratory: Negative for cough, choking, shortness of breath and wheezing.   Cardiovascular: Negative for chest pain, palpitations and leg swelling.  Gastrointestinal: Negative for nausea, vomiting, abdominal pain, diarrhea, constipation, blood in stool and abdominal distention.  Endocrine: Negative for cold intolerance, heat intolerance, polydipsia, polyphagia and polyuria.  Genitourinary: Positive for frequency (with urinary incontience). Negative for dysuria, urgency, hematuria, flank pain, vaginal discharge, difficulty urinating and pelvic pain.  Musculoskeletal: Positive for back pain. Negative for myalgias, arthralgias, gait problem, neck  pain and neck stiffness.  Skin: Negative for color change, pallor and rash.  Allergic/Immunologic: Negative.   Neurological: Negative for dizziness, tremors, seizures, syncope, weakness, numbness and headaches.  Hematological: Negative for adenopathy. Does not bruise/bleed easily.  Psychiatric/Behavioral: Negative for suicidal ideas, hallucinations, behavioral problems, confusion, sleep disturbance, dysphoric mood and agitation. The patient is not nervous/anxious and is not hyperactive.        Anxiety and depressive symptoms have improved since on Effexor     Filed Vitals:   07/16/15 1335  BP: 126/84  Pulse: 58  Temp: 97.6 F (36.4 C)  TempSrc: Oral  Height: $Remove'5\' 6"'[id number]$  (1.676 m)  Weight: 125 lb (56.7 kg)  SpO2: 95%   Body mass index is 20.19 kg/(m^2).  Physical Exam  Constitutional: She is oriented to person, place, and time. She appears well-developed and well-nourished. No distress.  HENT:  Head: Normocephalic and atraumatic.  Right Ear: External ear normal.  Left Ear: External ear normal.  Nose: Nose normal.  Mouth/Throat: Oropharynx is clear and moist. No oropharyngeal exudate.  Eyes: Conjunctivae and EOM are normal. Pupils are equal, round, and reactive to light. No scleral icterus.  Neck: Normal range of motion. Neck supple. No JVD present. No tracheal deviation present. No thyromegaly present.  Cardiovascular: Normal rate, regular rhythm, normal heart sounds and intact distal pulses.  Exam reveals no gallop and no friction rub.   No murmur heard. Pulmonary/Chest: Effort normal and breath sounds normal. No respiratory distress. She has no wheezes. She has no rales. She exhibits no tenderness.  Abdominal: Soft. Bowel sounds are normal. She exhibits no distension and no mass. There is no tenderness.  Musculoskeletal: Normal range of motion. She exhibits no edema or tenderness.  Lymphadenopathy:    She has no cervical adenopathy.  Neurological: She is alert and oriented to  person, place, and time. No cranial nerve deficit. Coordination normal.  Skin: Skin is warm and dry. No rash noted. She is not diaphoretic. No erythema. No pallor.  Psychiatric: She has a normal mood and affect. Her behavior is normal. Judgment and thought content normal.     Labs reviewed: Lab Summary Latest Ref Rng 01/21/2015 01/14/2015 07/28/2014 07/27/2014 07/26/2014 07/25/2014  Hemoglobin 11.6 - 15.9 g/dL 12.8 13.1 9.7(L) 6.9(LL) 8.0(L) 9.2(L)  Hematocrit 34.8 - 46.6 % 38.0 39.3 28.7(L) 19.9(L) 23.5(L) 27.3(L)  White count 3.9 - 10.3 10e3/uL 6.9 6.9 (None) 9.3 10.3 8.2  Platelet count 145 - 400 10e3/uL 272 315 (None) 182 195 240  Sodium 134 - 144 mmol/L (None) 142 (None) (None) 137 137  Potassium 3.5 - 5.2 mmol/L (None) 3.9 (None) (None) 3.4(L) 4.1  Calcium 8.7 - 10.3 mg/dL (None) 9.6 (None) (None) 8.3(L) 8.6  Phosphorus - (None) (None) (None) (None) (None) (None)  Creatinine 0.57 - 1.00 mg/dL (None) 0.67 (None) (None) 0.62 0.78  AST 0 - 40 IU/L (None) 12 (None) (None) (None) (  None)  Alk Phos 39 - 117 IU/L (None) 98 (None) (None) (None) (None)  Bilirubin 0.0 - 1.2 mg/dL (None) 0.4 (None) (None) (None) (None)  Glucose 65 - 99 mg/dL (None) 116(H) (None) (None) 132(H) 132(H)  Cholesterol - (None) (None) (None) (None) (None) (None)  HDL cholesterol - (None) (None) (None) (None) (None) (None)  Triglycerides - (None) (None) (None) (None) (None) (None)  LDL Direct - (None) (None) (None) (None) (None) (None)  LDL Calc - (None) (None) (None) (None) (None) (None)  Total protein - (None) (None) (None) (None) (None) (None)  Albumin 3.5 - 4.7 g/dL (None) 4.1 (None) (None) (None) (None)   Lab Results  Component Value Date   TSH 1.800 02/08/2014   Lab Results  Component Value Date   BUN 20 01/14/2015   No results found for: HGBA1C     Assessment/Plan  1. Essential hypertension controlled  2. Cramp of both lower extremities - Comprehensive metabolic panel; Future - Magnesium  3.  Morton's neuroma of left foot Discussed getting metatarsal support  4. Hyperlipidemia - Lipid panel; Future  5. Neuropathy (HCC) - Comprehensive metabolic panel; Future  6. Hemochromatosis - CBC With Differential; Future

## 2015-07-17 LAB — CBC WITH DIFFERENTIAL
Basophils Absolute: 0 10*3/uL (ref 0.0–0.2)
Basos: 0 %
EOS (ABSOLUTE): 0.1 10*3/uL (ref 0.0–0.4)
Eos: 2 %
Hematocrit: 38.7 % (ref 34.0–46.6)
Hemoglobin: 13.6 g/dL (ref 11.1–15.9)
IMMATURE GRANS (ABS): 0 10*3/uL (ref 0.0–0.1)
Immature Granulocytes: 0 %
LYMPHS: 38 %
Lymphocytes Absolute: 2.6 10*3/uL (ref 0.7–3.1)
MCH: 34.3 pg — AB (ref 26.6–33.0)
MCHC: 35.1 g/dL (ref 31.5–35.7)
MCV: 98 fL — AB (ref 79–97)
MONOS ABS: 0.7 10*3/uL (ref 0.1–0.9)
Monocytes: 9 %
NEUTROS ABS: 3.5 10*3/uL (ref 1.4–7.0)
NEUTROS PCT: 51 %
RBC: 3.96 x10E6/uL (ref 3.77–5.28)
RDW: 12.7 % (ref 12.3–15.4)
WBC: 6.9 10*3/uL (ref 3.4–10.8)

## 2015-07-17 LAB — COMPREHENSIVE METABOLIC PANEL
ALBUMIN: 4.2 g/dL (ref 3.5–4.7)
ALK PHOS: 104 IU/L (ref 39–117)
ALT: 12 IU/L (ref 0–32)
AST: 16 IU/L (ref 0–40)
Albumin/Globulin Ratio: 1.8 (ref 1.1–2.5)
BUN/Creatinine Ratio: 36 — ABNORMAL HIGH (ref 11–26)
BUN: 24 mg/dL (ref 8–27)
Bilirubin Total: 0.3 mg/dL (ref 0.0–1.2)
CO2: 27 mmol/L (ref 18–29)
CREATININE: 0.66 mg/dL (ref 0.57–1.00)
Calcium: 9.8 mg/dL (ref 8.7–10.3)
Chloride: 97 mmol/L (ref 97–108)
GFR calc Af Amer: 94 mL/min/{1.73_m2} (ref >59)
GFR calc non Af Amer: 82 mL/min/{1.73_m2} (ref >59)
GLUCOSE: 112 mg/dL — AB (ref 65–99)
Globulin, Total: 2.3 g/dL (ref 1.5–4.5)
Potassium: 4.5 mmol/L (ref 3.5–5.2)
Sodium: 141 mmol/L (ref 134–144)
Total Protein: 6.5 g/dL (ref 6.0–8.5)

## 2015-07-17 LAB — LIPID PANEL
Chol/HDL Ratio: 5.5 ratio units — ABNORMAL HIGH (ref 0.0–4.4)
Cholesterol, Total: 175 mg/dL (ref 100–199)
HDL: 32 mg/dL — ABNORMAL LOW (ref >39)
LDL Calculated: 92 mg/dL (ref 0–99)
Triglycerides: 253 mg/dL — ABNORMAL HIGH (ref 0–149)
VLDL CHOLESTEROL CAL: 51 mg/dL — AB (ref 5–40)

## 2015-07-17 LAB — MAGNESIUM: Magnesium: 2.2 mg/dL (ref 1.6–2.3)

## 2015-07-19 ENCOUNTER — Other Ambulatory Visit: Payer: Self-pay | Admitting: *Deleted

## 2015-07-22 ENCOUNTER — Other Ambulatory Visit (HOSPITAL_BASED_OUTPATIENT_CLINIC_OR_DEPARTMENT_OTHER): Payer: Medicare Other

## 2015-07-22 LAB — CBC WITH DIFFERENTIAL/PLATELET
BASO%: 0.1 % (ref 0.0–2.0)
Basophils Absolute: 0 10*3/uL (ref 0.0–0.1)
EOS ABS: 0.1 10*3/uL (ref 0.0–0.5)
EOS%: 1.4 % (ref 0.0–7.0)
HEMATOCRIT: 40.8 % (ref 34.8–46.6)
HEMOGLOBIN: 13.9 g/dL (ref 11.6–15.9)
LYMPH#: 2.5 10*3/uL (ref 0.9–3.3)
LYMPH%: 35.1 % (ref 14.0–49.7)
MCH: 33.6 pg (ref 25.1–34.0)
MCHC: 34.1 g/dL (ref 31.5–36.0)
MCV: 98.6 fL (ref 79.5–101.0)
MONO#: 0.7 10*3/uL (ref 0.1–0.9)
MONO%: 10 % (ref 0.0–14.0)
NEUT%: 53.4 % (ref 38.4–76.8)
NEUTROS ABS: 3.7 10*3/uL (ref 1.5–6.5)
PLATELETS: 231 10*3/uL (ref 145–400)
RBC: 4.14 10*6/uL (ref 3.70–5.45)
RDW: 11.9 % (ref 11.2–14.5)
WBC: 7 10*3/uL (ref 3.9–10.3)

## 2015-07-22 LAB — FERRITIN CHCC: Ferritin: 92 ng/ml (ref 9–269)

## 2015-07-23 ENCOUNTER — Other Ambulatory Visit: Payer: Self-pay | Admitting: Hematology

## 2015-07-29 ENCOUNTER — Telehealth: Payer: Self-pay | Admitting: Hematology

## 2015-07-29 ENCOUNTER — Encounter: Payer: Self-pay | Admitting: Hematology

## 2015-07-29 ENCOUNTER — Ambulatory Visit (HOSPITAL_BASED_OUTPATIENT_CLINIC_OR_DEPARTMENT_OTHER): Payer: Medicare Other

## 2015-07-29 ENCOUNTER — Ambulatory Visit (HOSPITAL_BASED_OUTPATIENT_CLINIC_OR_DEPARTMENT_OTHER): Payer: Medicare Other | Admitting: Hematology

## 2015-07-29 VITALS — BP 160/77 | HR 59 | Temp 97.7°F | Resp 18

## 2015-07-29 DIAGNOSIS — R5383 Other fatigue: Secondary | ICD-10-CM

## 2015-07-29 DIAGNOSIS — E860 Dehydration: Secondary | ICD-10-CM

## 2015-07-29 MED ORDER — SODIUM CHLORIDE 0.9 % IV SOLN
Freq: Once | INTRAVENOUS | Status: AC
  Administered 2015-07-29: 15:00:00 via INTRAVENOUS

## 2015-07-29 MED ORDER — SODIUM CHLORIDE 0.9 % IV SOLN: 1000.0000 mL | Freq: Once | INTRAVENOUS | Status: DC

## 2015-07-29 NOTE — Progress Notes (Signed)
Per Thu RN, Dr. Ernestina Penna nurse, pt is to receive phlebotomy to pull off 250cc of blood and then to receive 250cc of NS over 67minutes during her post observation. Phlebotomy Performed using 18 gauge PIV starting at 1435 and ending 1505 removing 207cc; Thu RN aware and okay to proceed with fluids. Pt monitored for 30 minutes post phlebotomy while receiving IV fluids. Pt and VS stable at time of discharge.

## 2015-07-29 NOTE — Telephone Encounter (Signed)
per pof to sch pt appt-gave pt copy of avs-adv [facility] will call to sch [facility]

## 2015-07-29 NOTE — Patient Instructions (Signed)
Therapeutic Phlebotomy, Care After  Refer to this sheet in the next few weeks. These instructions provide you with information about caring for yourself after your procedure. Your health care provider may also give you more specific instructions. Your treatment has been planned according to current medical practices, but problems sometimes occur. Call your health care provider if you have any problems or questions after your procedure.  WHAT TO EXPECT AFTER THE PROCEDURE  After your procedure, it is common to have:   Light-headedness or dizziness. You may feel faint.   Nausea.   Tiredness.  HOME CARE INSTRUCTIONS  Activities   Return to your normal activities as directed by your health care provider. Most people can go back to their normal activities right away.   Avoid strenuous physical activity and heavy lifting or pulling for about 5 hours after the procedure. Do not lift anything that is heavier than 10 lb (4.5 kg).   Athletes should avoid strenuous exercise for at least 12 hours.   Change positions slowly for the remainder of the day. This will help to prevent light-headedness or fainting.   If you feel light-headed, lie down until the feeling goes away.  Eating and Drinking   Be sure to eat well-balanced meals for the next 24 hours.   Drink enough fluid to keep your urine clear or pale yellow.   Avoid drinking alcohol on the day that you had the procedure.  Care of the Needle Insertion Site   Keep your bandage dry. You can remove the bandage after about 5 hours or as directed by your health care provider.   If you have bleeding from the needle insertion site, elevate your arm and press firmly on the site until the bleeding stops.   If you have bruising at the site, apply ice to the area:   Put ice in a plastic bag.   Place a towel between your skin and the bag.   Leave the ice on for 20 minutes, 2-3 times a day for the first 24 hours.   If the swelling does not go away after 24 hours, apply  a warm, moist washcloth to the area for 20 minutes, 2-3 times a day.  General Instructions   Avoid smoking for at least 30 minutes after the procedure.   Keep all follow-up visits as directed by your health care provider. It is important to continue with further therapeutic phlebotomy treatments as directed.  SEEK MEDICAL CARE IF:   You have redness, swelling, or pain at the needle insertion site.   You have fluid, blood, or pus coming from the needle insertion site.   You feel light-headed, dizzy, or nauseated, and the feeling does not go away.   You notice new bruising at the needle insertion site.   You feel weaker than normal.   You have a fever or chills.  SEEK IMMEDIATE MEDICAL CARE IF:   You have severe nausea or vomiting.   You have chest pain.   You have trouble breathing.    This information is not intended to replace advice given to you by your health care provider. Make sure you discuss any questions you have with your health care provider.    Document Released: 03/02/2011 Document Revised: 02/12/2015 Document Reviewed: 09/24/2014  Elsevier Interactive Patient Education 2016 [employer]

## 2015-07-29 NOTE — Telephone Encounter (Signed)
per Vaughan Basta NPR -cld and sch ECHO-cld & spoke to [name](daughter) and gave pt time/date/location for ECHO

## 2015-07-29 NOTE — Progress Notes (Signed)
[facility] OFFICE PROGRESS NOTE  Lauree Chandler, NP [address] [place] [address]  DIAGNOSIS: Hemochromatosis - Plan: US Abdomen Limited, ECHOCARDIOGRAM COMPLETE, CBC with Differential, Ferritin, Iron and TIBC [facility], 0.9 %  sodium chloride infusion  No chief complaint on file.   CURRENT TREATMENT: Prn phlebotomy to keep ferritin <=100   INTERVAL HISTORY: Denise Lane 81 y.o. female with a history of hemochromatosis is here for follow up. She was last ween by me 6 months ago. She is doing well overall. Her main complaint is moderate fatigue, she still able to live independently and takes care of her husband. She still drives, but did coming with her daughter today, anticipating she may Phlebotomy today. She otherwise is doing well.  MEDICAL HISTORY: Past Medical History  Diagnosis Date  . Hemochromatosis 1987  . Stenosis of esophagus   . Dysphagia   . Muscle pain   . History of colonic polyps     NOTED 11-20-09 IN COLONOSCOPY REPORT  . Diverticulosis of sigmoid colon 11-20-2009  . Hemorrhoids, internal 11-20-09  . Hyperlipidemia     no meds required  . Tremor, essential 10-29-09  . Hemochromatosis   . PONV (postoperative nausea and vomiting)   . History of bronchitis 1991-1996    "chronic; related to winter" (10/18/2012)  . History of blood transfusion     "S/P colonoscopy after polyp removed; got 2 units; esophageal bleed got 1 unit; really messed up hemochromatosis" (1/7/20214)  . Occasional tremors   . Joint pain   . Joint swelling   . Bruises easily   . H/O hiatal hernia   . Gastric ulcer   . Vomiting     occasionally  . Urinary urgency     takes Ditropan daily  . Osteoporosis   . Dry eyes     uses Refresh eye drops daily as needed  . Rheumatic fever     hx of  . Osteoarthritis   . Osteoarthritis of right hip 10/18/2012  . Chronic lower back pain   . Basal cell cancer     "face, legs" (10/18/2012)  . Squamous carcinoma ([place])     "LLE"  (10/18/2012)  . Hypertension     takes Metoprolol and Losartan daily  . Depression     takes Effexor daily  . GERD (gastroesophageal reflux disease)     takes Omeprazole daily  . Overactive bladder     takes Myrbetriq daily  . Muscle spasm     takes Robaxin daily as needed    INTERIM HISTORY: has Hemochromatosis; Osteoarthritis of right hip; Muscle pain; Hyperlipidemia; Hypertension; Urinary incontinence; Back pain; Osteoarthritis; Neuropathy ([place]); Anxiety and depression; Osteoarthritis of left hip; Right thigh pain; Periprosthetic hip fracture ([place]); Hip fracture ([place]); Postoperative anemia due to acute blood loss; Leg cramps; and Morton's neuroma of left foot on her problem list.    ALLERGIES:  is allergic to codeine; dilaudid; macrodantin; morphine and related; and sulfa antibiotics.  MEDICATIONS: has a current medication list which includes the following prescription(s): acetaminophen, calcium citrate, carboxymethylcellulose, cholecalciferol, gabapentin, losartan, metoprolol tartrate, omeprazole, oxybutynin, and venlafaxine xr, and the following Facility-Administered Medications: sodium chloride.  SURGICAL HISTORY:  Past Surgical History  Procedure Laterality Date  . Tonsillectomy  1953  . Appendectomy  1952  . Cholecystectomy  1977  . Tubal ligation  1975  . Lumbar disc surgery  1989  . Shoulder arthroscopy w/ rotator cuff repair  1998?    "left" (10/18/2012)  . Dilation  and curettage of uterus  1975  . Cataract extraction w/ intraocular lens  implant, bilateral  2001    "both eyes" (10/18/2012)  . Esophagogastroduodenoscopy    . Colonoscopy    . Total hip arthroplasty  10/18/2012    "right" (10/18/2012)  . Skin cancer excision      "multiple" (10/18/2012)  . Total hip arthroplasty  10/18/2012    Procedure: TOTAL HIP ARTHROPLASTY;  Surgeon: Johnny Bridge, MD;  Location: [facility];  Service: Orthopedics;  Laterality: Right;  . Total hip arthroplasty Left 07/24/2014    dr Mardelle Matte  .  Total hip arthroplasty Left 07/24/2014    Procedure: LEFT TOTAL HIP ARTHROPLASTY;  Surgeon: Johnny Bridge, MD;  Location: [facility];  Service: Orthopedics;  Laterality: Left;    REVIEW OF SYSTEMS:   Constitutional: Denies fevers, chills or abnormal weight loss Eyes: Denies blurriness of vision Ears, nose, mouth, throat, and face: Denies mucositis or sore throat Respiratory: Denies cough, dyspnea or wheezes Cardiovascular: Denies palpitation, chest discomfort or lower extremity swelling Gastrointestinal:  Denies nausea, heartburn or change in bowel habits Skin: Denies abnormal skin rashes Lymphatics: Denies new lymphadenopathy or easy bruising Neurological:Denies numbness, tingling or new weaknesses Behavioral/Psych: Mood is stable, no new changes  All other systems were reviewed with the patient and are negative.  PHYSICAL EXAMINATION: ECOG PERFORMANCE STATUS: 1 - Symptomatic but completely ambulatory  Blood pressure 148/65, pulse 60, temperature 98.4 F (36.9 C), temperature source Oral, resp. rate 18, height 5\' 6"  (1.676 m), weight 124 lb 8 oz (56.473 kg), SpO2 99 %.  GENERAL:alert, no distress and comfortable; elderly, thin and ambulates with a cane SKIN: skin color, texture, turgor are normal, no rashes or significant lesions EYES: normal, Conjunctiva are pink and non-injected, sclera clear OROPHARYNX:no exudate, no erythema and lips, buccal mucosa, and tongue normal  NECK: supple, thyroid normal size, non-tender, without nodularity LYMPH:  no palpable lymphadenopathy in the cervical, axillary or supraclavicular LUNGS: clear to auscultation with normal breathing effort, no wheezes or rhonchi HEART: regular rate & rhythm and no murmurs and no lower extremity edema ABDOMEN:abdomen soft, non-tender and normal bowel sounds Musculoskeletal:no cyanosis of digits and no clubbing;Stable arthritic changes to fingers bilaterally NEURO: alert & oriented x 3 with fluent speech, no focal  motor/sensory deficits  Labs:  CBC Latest Ref Rng 07/22/2015 07/16/2015 01/21/2015  WBC 3.9 - 10.3 10e3/uL 7.0 6.9 6.9  Hemoglobin 11.6 - 15.9 g/dL 13.9 - 12.8  Hematocrit 34.8 - 46.6 % 40.8 38.7 38.0  Platelets 145 - 400 10e3/uL 231 - 272    CMP Latest Ref Rng 07/16/2015 01/14/2015 07/26/2014  Glucose 65 - 99 mg/dL 112(H) 116(H) 132(H)  BUN 8 - 27 mg/dL 24 20 13   Creatinine 0.57 - 1.00 mg/dL 0.66 0.67 0.62  Sodium 134 - 144 mmol/L 141 142 137  Potassium 3.5 - 5.2 mmol/L 4.5 3.9 3.4(L)  Chloride 97 - 108 mmol/L 97 101 99  CO2 18 - 29 mmol/L 27 27 27   Calcium 8.7 - 10.3 mg/dL 9.8 9.6 8.3(L)  Total Protein 6.0 - 8.5 g/dL 6.5 6.4 -  Total Bilirubin 0.0 - 1.2 mg/dL 0.3 0.4 -  Alkaline Phos 39 - 117 IU/L 104 98 -  AST 0 - 40 IU/L 16 12 -  ALT 0 - 32 IU/L 12 12 -    Results for Denise, Lane (MRN [id number]) as of 07/29/2015 07:55  Ref. Range 02/12/2014 09:57 01/21/2015 13:01 07/22/2015 12:38  Ferritin Latest Ref Range: 9-269 ng/ml 79  79 92      RADIOGRAPHIC STUDIES: No results found.  ASSESSMENT: Denise Lane 81 y.o. female with a history of Hemochromatosis - Plan: US Abdomen Limited, ECHOCARDIOGRAM COMPLETE, CBC with Differential, Ferritin, Iron and TIBC [facility], 0.9 %  sodium chloride infusion   PLAN:  1.Hemachromatosis:  -She is clinically doing very well. -given her advanced age and symptomatic phlebotomy (presyncope ), I will only do phlebotomize if her ferritin >100, and half unit each time.  -Her hematocrit is 40.8% today and the ferritin 92. She was anticipating to have phlebotomy, I'll do half unit today with normal saline 250 mL infusion. -She has never had liver ultrasound or echocardiogram, I'll obtain those tests in the next months to ruled out hemochromatosis related liver cirrhosis or heart failure. She does have moderate fatigue, no other clinical symptoms. Her liver function tests is normal.   Follow-up: Return to clinic in 3 months, lab with CBC and ferritin  1 week before visit   All questions were answered. The patient knows to call the clinic with any problems, questions or concerns. We can certainly see the patient much sooner if necessary.  I spent 10 minutes counseling the patient face to face. The total time spent in the appointment was 15 minutes.    Truitt Merle, MD 07/29/2015 2:08 PM

## 2015-08-16 ENCOUNTER — Other Ambulatory Visit: Payer: Self-pay

## 2015-08-16 ENCOUNTER — Encounter: Payer: Self-pay | Admitting: Nurse Practitioner

## 2015-08-16 MED ORDER — OXYBUTYNIN CHLORIDE 5 MG PO TABS: 5.0000 mg | ORAL_TABLET | Freq: Two times a day (BID) | ORAL | Status: DC

## 2015-08-16 NOTE — Telephone Encounter (Signed)
Sent a 90 day supply of Ditropan for patient as requested.

## 2015-08-19 ENCOUNTER — Other Ambulatory Visit: Payer: Self-pay | Admitting: Hematology

## 2015-08-19 ENCOUNTER — Ambulatory Visit (HOSPITAL_COMMUNITY)
Admission: RE | Admit: 2015-08-19 | Discharge: 2015-08-19 | Disposition: A | Discharge status: Home / Self Care | Payer: Medicare Other | Source: Ambulatory Visit | Attending: Hematology | Admitting: Hematology

## 2015-08-19 DIAGNOSIS — I071 Rheumatic tricuspid insufficiency: Secondary | ICD-10-CM | POA: Diagnosis not present

## 2015-08-19 DIAGNOSIS — Z0189 Encounter for other specified special examinations: Secondary | ICD-10-CM | POA: Insufficient documentation

## 2015-08-19 DIAGNOSIS — I340 Nonrheumatic mitral (valve) insufficiency: Secondary | ICD-10-CM | POA: Insufficient documentation

## 2015-08-19 DIAGNOSIS — Z9049 Acquired absence of other specified parts of digestive tract: Secondary | ICD-10-CM | POA: Diagnosis not present

## 2015-08-19 DIAGNOSIS — I341 Nonrheumatic mitral (valve) prolapse: Secondary | ICD-10-CM | POA: Diagnosis not present

## 2015-08-19 DIAGNOSIS — I351 Nonrheumatic aortic (valve) insufficiency: Secondary | ICD-10-CM | POA: Insufficient documentation

## 2015-08-19 DIAGNOSIS — I10 Essential (primary) hypertension: Secondary | ICD-10-CM | POA: Insufficient documentation

## 2015-08-19 NOTE — Progress Notes (Signed)
  Echocardiogram 2D Echocardiogram has been performed.  Tresa Res 08/19/2015, 2:54 PM

## 2015-08-22 ENCOUNTER — Telehealth: Payer: Self-pay | Admitting: *Deleted

## 2015-08-22 NOTE — Telephone Encounter (Signed)
-----   Message from Truitt Merle, MD sent at 08/20/2015 10:54 PM EST ----- Please let her know the [place] result, thanks.  Truitt Merle  08/20/2015

## 2015-08-22 NOTE — Telephone Encounter (Signed)
-----   Message from Truitt Merle, MD sent at 08/20/2015 11:00 PM EST ----- Let he know the ECHO result.   Thanks.  Truitt Merle

## 2015-08-22 NOTE — Telephone Encounter (Signed)
Pt's daughter notified of ECHO & [place] results per Dr Burr Medico.

## 2015-10-01 DIAGNOSIS — Z96642 Presence of left artificial hip joint: Secondary | ICD-10-CM | POA: Diagnosis not present

## 2015-10-21 ENCOUNTER — Encounter: Payer: Self-pay | Admitting: Nurse Practitioner

## 2015-10-21 ENCOUNTER — Other Ambulatory Visit: Payer: Self-pay | Admitting: *Deleted

## 2015-10-21 DIAGNOSIS — F329 Major depressive disorder, single episode, unspecified: Secondary | ICD-10-CM

## 2015-10-21 DIAGNOSIS — F419 Anxiety disorder, unspecified: Secondary | ICD-10-CM

## 2015-10-21 DIAGNOSIS — I10 Essential (primary) hypertension: Secondary | ICD-10-CM

## 2015-10-21 DIAGNOSIS — G629 Polyneuropathy, unspecified: Secondary | ICD-10-CM

## 2015-10-21 MED ORDER — VENLAFAXINE HCL ER 37.5 MG PO CP24: 37.5000 mg | ORAL_CAPSULE | Freq: Every day | ORAL | Status: DC

## 2015-10-21 MED ORDER — OXYBUTYNIN CHLORIDE 5 MG PO TABS: 5.0000 mg | ORAL_TABLET | Freq: Two times a day (BID) | ORAL | Status: DC

## 2015-10-21 MED ORDER — GABAPENTIN 300 MG PO CAPS: ORAL_CAPSULE | ORAL | Status: DC

## 2015-10-21 MED ORDER — LOSARTAN POTASSIUM 50 MG PO TABS: 50.0000 mg | ORAL_TABLET | Freq: Every day | ORAL | Status: DC

## 2015-10-21 MED ORDER — METOPROLOL TARTRATE 25 MG PO TABS: 25.0000 mg | ORAL_TABLET | Freq: Every day | ORAL | Status: DC

## 2015-10-21 NOTE — Telephone Encounter (Signed)
[facility] 

## 2015-10-22 ENCOUNTER — Other Ambulatory Visit: Payer: BLUE CROSS/BLUE SHIELD

## 2015-10-23 ENCOUNTER — Other Ambulatory Visit: Payer: Self-pay | Admitting: *Deleted

## 2015-10-23 DIAGNOSIS — F419 Anxiety disorder, unspecified: Secondary | ICD-10-CM

## 2015-10-23 DIAGNOSIS — G629 Polyneuropathy, unspecified: Secondary | ICD-10-CM

## 2015-10-23 DIAGNOSIS — F32A Depression, unspecified: Secondary | ICD-10-CM

## 2015-10-23 DIAGNOSIS — F329 Major depressive disorder, single episode, unspecified: Secondary | ICD-10-CM

## 2015-10-23 DIAGNOSIS — I10 Essential (primary) hypertension: Secondary | ICD-10-CM

## 2015-10-23 MED ORDER — LOSARTAN POTASSIUM 50 MG PO TABS: 50.0000 mg | ORAL_TABLET | Freq: Every day | ORAL | Status: DC

## 2015-10-23 MED ORDER — GABAPENTIN 300 MG PO CAPS: ORAL_CAPSULE | ORAL | Status: DC

## 2015-10-23 MED ORDER — OXYBUTYNIN CHLORIDE 5 MG PO TABS: 5.0000 mg | ORAL_TABLET | Freq: Two times a day (BID) | ORAL | Status: DC

## 2015-10-23 MED ORDER — VENLAFAXINE HCL ER 37.5 MG PO CP24: 37.5000 mg | ORAL_CAPSULE | Freq: Every day | ORAL | Status: DC

## 2015-10-23 MED ORDER — METOPROLOL TARTRATE 25 MG PO TABS: 25.0000 mg | ORAL_TABLET | Freq: Every day | ORAL | Status: DC

## 2015-10-23 NOTE — Telephone Encounter (Signed)
[facility] 

## 2015-10-23 NOTE — Telephone Encounter (Signed)
[facility]

## 2015-10-24 ENCOUNTER — Other Ambulatory Visit (HOSPITAL_BASED_OUTPATIENT_CLINIC_OR_DEPARTMENT_OTHER): Payer: Commercial Managed Care - HMO

## 2015-10-24 LAB — IRON AND TIBC
%SAT: 78 % — AB (ref 21–57)
Iron: 172 ug/dL — ABNORMAL HIGH (ref 41–142)
TIBC: 220 ug/dL — ABNORMAL LOW (ref 236–444)
UIBC: 48 ug/dL — AB (ref 120–384)

## 2015-10-24 LAB — CBC WITH DIFFERENTIAL/PLATELET
BASO%: 0.2 % (ref 0.0–2.0)
BASOS ABS: 0 10*3/uL (ref 0.0–0.1)
EOS ABS: 0.2 10*3/uL (ref 0.0–0.5)
EOS%: 2.4 % (ref 0.0–7.0)
HCT: 36.7 % (ref 34.8–46.6)
HEMOGLOBIN: 12.5 g/dL (ref 11.6–15.9)
LYMPH%: 33.1 % (ref 14.0–49.7)
MCH: 33.3 pg (ref 25.1–34.0)
MCHC: 34.1 g/dL (ref 31.5–36.0)
MCV: 97.9 fL (ref 79.5–101.0)
MONO#: 0.7 10*3/uL (ref 0.1–0.9)
MONO%: 10.8 % (ref 0.0–14.0)
NEUT%: 53.5 % (ref 38.4–76.8)
NEUTROS ABS: 3.6 10*3/uL (ref 1.5–6.5)
PLATELETS: 215 10*3/uL (ref 145–400)
RBC: 3.75 10*6/uL (ref 3.70–5.45)
RDW: 12.1 % (ref 11.2–14.5)
WBC: 6.6 10*3/uL (ref 3.9–10.3)
lymph#: 2.2 10*3/uL (ref 0.9–3.3)

## 2015-10-24 LAB — FERRITIN: Ferritin: 80 ng/ml (ref 9–269)

## 2015-10-27 ENCOUNTER — Telehealth: Payer: Self-pay | Admitting: Hematology

## 2015-10-27 NOTE — Telephone Encounter (Signed)
Due to PAL - moved 1/17 f/u to 1/25. Left message for patient.

## 2015-10-29 ENCOUNTER — Ambulatory Visit: Payer: BLUE CROSS/BLUE SHIELD | Admitting: Hematology

## 2015-10-30 ENCOUNTER — Telehealth: Payer: Self-pay | Admitting: Hematology

## 2015-10-30 NOTE — Telephone Encounter (Signed)
Patient called and moved 1/25 f/u to 1/26. Patient has new date/time.

## 2015-11-04 ENCOUNTER — Other Ambulatory Visit: Payer: Self-pay | Admitting: *Deleted

## 2015-11-04 DIAGNOSIS — I10 Essential (primary) hypertension: Secondary | ICD-10-CM

## 2015-11-04 DIAGNOSIS — G629 Polyneuropathy, unspecified: Secondary | ICD-10-CM

## 2015-11-04 DIAGNOSIS — F329 Major depressive disorder, single episode, unspecified: Secondary | ICD-10-CM

## 2015-11-04 DIAGNOSIS — F419 Anxiety disorder, unspecified: Secondary | ICD-10-CM

## 2015-11-04 MED ORDER — GABAPENTIN 300 MG PO CAPS
ORAL_CAPSULE | ORAL | Status: DC
Start: 2015-11-04 — End: 2016-05-14

## 2015-11-04 MED ORDER — METOPROLOL TARTRATE 25 MG PO TABS: 25.0000 mg | ORAL_TABLET | Freq: Every day | ORAL | Status: DC

## 2015-11-04 MED ORDER — VENLAFAXINE HCL ER 37.5 MG PO CP24: 37.5000 mg | ORAL_CAPSULE | Freq: Every day | ORAL | Status: DC

## 2015-11-04 NOTE — Telephone Encounter (Signed)
[facility] 

## 2015-11-06 ENCOUNTER — Ambulatory Visit: Payer: Commercial Managed Care - HMO | Admitting: Hematology

## 2015-11-07 ENCOUNTER — Telehealth: Payer: Self-pay | Admitting: Hematology

## 2015-11-07 ENCOUNTER — Ambulatory Visit (HOSPITAL_BASED_OUTPATIENT_CLINIC_OR_DEPARTMENT_OTHER): Payer: Commercial Managed Care - HMO | Admitting: Hematology

## 2015-11-07 NOTE — Telephone Encounter (Signed)
per pof to sch pt appt-gave pt copy of avs °

## 2015-11-07 NOTE — Progress Notes (Signed)
[facility] OFFICE PROGRESS NOTE  GREEN, [name], MD [address] [address]  DIAGNOSIS: Hemochromatosis  Chief Complaint  Patient presents with  . Follow-up    CURRENT TREATMENT: Prn phlebotomy to keep ferritin <=100, average once a year, last on 07/29/2015   INTERVAL HISTORY: [name] 82 y.o. female with a history of hemochromatosis is here for follow up. She was last ween by me 6 months ago. She is doing well overall. She lives with her husband, is her husband's caregiver. She is independent, still drives. She denies any pain, or other discomfort. No ED visit or hospitalizations last 6 months.   MEDICAL HISTORY: Past Medical History  Diagnosis Date  . Hemochromatosis 1987  . Stenosis of esophagus   . Dysphagia   . Muscle pain   . History of colonic polyps     NOTED 11-20-09 IN COLONOSCOPY REPORT  . Diverticulosis of sigmoid colon 11-20-2009  . Hemorrhoids, internal 11-20-09  . Hyperlipidemia     no meds required  . Tremor, essential 10-29-09  . Hemochromatosis   . PONV (postoperative nausea and vomiting)   . History of bronchitis 1991-1996    "chronic; related to winter" (10/18/2012)  . History of blood transfusion     "S/P colonoscopy after polyp removed; got 2 units; esophageal bleed got 1 unit; really messed up hemochromatosis" (1/7/20214)  . Occasional tremors   . Joint pain   . Joint swelling   . Bruises easily   . H/O hiatal hernia   . Gastric ulcer   . Vomiting     occasionally  . Urinary urgency     takes Ditropan daily  . Osteoporosis   . Dry eyes     uses Refresh eye drops daily as needed  . Rheumatic fever     hx of  . Osteoarthritis   . Osteoarthritis of right hip 10/18/2012  . Chronic lower back pain   . Basal cell cancer     "face, legs" (10/18/2012)  . Squamous carcinoma ([place])     "LLE" (10/18/2012)  . Hypertension     takes Metoprolol and Losartan daily  . Depression     takes Effexor daily  . GERD  (gastroesophageal reflux disease)     takes Omeprazole daily  . Overactive bladder     takes Myrbetriq daily  . Muscle spasm     takes Robaxin daily as needed    INTERIM HISTORY: has Hemochromatosis; Osteoarthritis of right hip; Muscle pain; Hyperlipidemia; Hypertension; Urinary incontinence; Back pain; Osteoarthritis; Neuropathy ([place]); Anxiety and depression; Osteoarthritis of left hip; Right thigh pain; Periprosthetic hip fracture ([place]); Hip fracture ([place]); Postoperative anemia due to acute blood loss; Leg cramps; and Morton's neuroma of left foot on her problem list.    ALLERGIES:  is allergic to codeine; dilaudid; macrodantin; morphine and related; and sulfa antibiotics.  MEDICATIONS: has a current medication list which includes the following prescription(s): acetaminophen, calcium citrate, carboxymethylcellulose, cholecalciferol, gabapentin, losartan, metoprolol tartrate, omeprazole, oxybutynin, and venlafaxine xr.  SURGICAL HISTORY:  Past Surgical History  Procedure Laterality Date  . Tonsillectomy  1953  . Appendectomy  1952  . Cholecystectomy  1977  . Tubal ligation  1975  . Lumbar disc surgery  1989  . Shoulder arthroscopy w/ rotator cuff repair  1998?    "left" (10/18/2012)  . Dilation and curettage of uterus  1975  . Cataract extraction w/ intraocular lens  implant, bilateral  2001    "both eyes" (10/18/2012)  .  Esophagogastroduodenoscopy    . Colonoscopy    . Total hip arthroplasty  10/18/2012    "right" (10/18/2012)  . Skin cancer excision      "multiple" (10/18/2012)  . Total hip arthroplasty  10/18/2012    Procedure: TOTAL HIP ARTHROPLASTY;  Surgeon: Johnny Bridge, MD;  Location: [facility];  Service: Orthopedics;  Laterality: Right;  . Total hip arthroplasty Left 07/24/2014    dr Mardelle Matte  . Total hip arthroplasty Left 07/24/2014    Procedure: LEFT TOTAL HIP ARTHROPLASTY;  Surgeon: Johnny Bridge, MD;  Location: [facility];  Service: Orthopedics;  Laterality: Left;    REVIEW OF  SYSTEMS:   Constitutional: Denies fevers, chills or abnormal weight loss Eyes: Denies blurriness of vision Ears, nose, mouth, throat, and face: Denies mucositis or sore throat Respiratory: Denies cough, dyspnea or wheezes Cardiovascular: Denies palpitation, chest discomfort or lower extremity swelling Gastrointestinal:  Denies nausea, heartburn or change in bowel habits Skin: Denies abnormal skin rashes Lymphatics: Denies new lymphadenopathy or easy bruising Neurological:Denies numbness, tingling or new weaknesses Behavioral/Psych: Mood is stable, no new changes  All other systems were reviewed with the patient and are negative.  PHYSICAL EXAMINATION: ECOG PERFORMANCE STATUS: 1 - Symptomatic but completely ambulatory  Blood pressure 163/58, pulse 57, temperature 98 F (36.7 C), temperature source Oral, resp. rate 18, height 5\' 6"  (1.676 m), weight 124 lb 1.6 oz (56.291 kg), SpO2 98 %.  GENERAL:alert, no distress and comfortable; elderly, thin and ambulates with a cane SKIN: skin color, texture, turgor are normal, no rashes or significant lesions EYES: normal, Conjunctiva are pink and non-injected, sclera clear OROPHARYNX:no exudate, no erythema and lips, buccal mucosa, and tongue normal  NECK: supple, thyroid normal size, non-tender, without nodularity LYMPH:  no palpable lymphadenopathy in the cervical, axillary or supraclavicular LUNGS: clear to auscultation with normal breathing effort, no wheezes or rhonchi HEART: regular rate & rhythm and no murmurs and no lower extremity edema ABDOMEN:abdomen soft, non-tender and normal bowel sounds Musculoskeletal:no cyanosis of digits and no clubbing;Stable arthritic changes to fingers bilaterally NEURO: alert & oriented x 3 with fluent speech, no focal motor/sensory deficits  Labs:  CBC Latest Ref Rng 10/24/2015 07/22/2015 07/16/2015  WBC 3.9 - 10.3 10e3/uL 6.6 7.0 6.9  Hemoglobin 11.6 - 15.9 g/dL 12.5 13.9 -  Hematocrit 34.8 - 46.6 % 36.7  40.8 38.7  Platelets 145 - 400 10e3/uL 215 231 -    CMP Latest Ref Rng 07/16/2015 01/14/2015 07/26/2014  Glucose 65 - 99 mg/dL 112(H) 116(H) 132(H)  BUN 8 - 27 mg/dL 24 20 13   Creatinine 0.57 - 1.00 mg/dL 0.66 0.67 0.62  Sodium 134 - 144 mmol/L 141 142 137  Potassium 3.5 - 5.2 mmol/L 4.5 3.9 3.4(L)  Chloride 97 - 108 mmol/L 97 101 99  CO2 18 - 29 mmol/L 27 27 27   Calcium 8.7 - 10.3 mg/dL 9.8 9.6 8.3(L)  Total Protein 6.0 - 8.5 g/dL 6.5 6.4 -  Total Bilirubin 0.0 - 1.2 mg/dL 0.3 0.4 -  Alkaline Phos 39 - 117 IU/L 104 98 -  AST 0 - 40 IU/L 16 12 -  ALT 0 - 32 IU/L 12 12 -   Ferritin  Status: Finalresult Visible to patient:  MyChart Nextappt: 11/21/2015 at 02:15 PM in Geriatric Medicine (EUBANKS, JESSICA K, NP)           Ref Range 2wk ago  23mo ago     Ferritin 9 - 269 ng/ml 80 92  Iron and TIBC  Status: Finalresult Visible to patient:  MyChart Nextappt: 11/21/2015 at 02:15 PM in Geriatric Medicine (EUBANKS, JESSICA K, NP)              Ref Range 2wk ago  36yr ago     Iron 41 - 142 ug/dL 172 (H) 115R    TIBC 236 - 444 ug/dL 220 (L) 250R    UIBC 120 - 384 ug/dL 48 (L) 135R    %SAT 21 - 57 % 78 (H) 46R           RADIOGRAPHIC STUDIES: No results found.  ASSESSMENT: Denise Lane 82 y.o. female with a history of Hemochromatosis   PLAN:  1.Hemachromatosis:  -She is clinically doing very well. -given her advanced age and symptomatic phlebotomy (presyncope ), I will only do phlebotomize if her ferritin >100 or very high transferrin saturation, and half unit each time.  -Her hematocrit is 36.7% today and the ferritin 80. Her serum iron and TIBC has not been checked for years, and he was quite high last week, with transferrin saturation 78%. Ideally I would like to have transferrin saturation below 50% also.  -Her daughter is not with her today, she is reluctant to have phlebotomy due to her advanced age and some dizziness  after procedure. I will hold it for today.  -I recommend her to repeat lab every 3 months. If her ferritin >100 or transferrin saturation>60%, i will recommend phlebotomy, I'll do half unit with normal saline 250 mL infusion. -Her liver ultrasound on 08/19/2015 shows no liver or spleen abnormality. We'll repeat every few years if needed.  Follow-up: Return to clinic every 3 months, lab with CBC, ferritin and serum iron. I'll set up a phlebotomy one week after her next blood draw in 3 months. She will call us in paternal lab and phlebotomy appointment to see if she needs coming in.  I called her daughter and discussed the above plan. She agrees.  All questions were answered. The patient knows to call the clinic with any problems, questions or concerns. We can certainly see the patient much sooner if necessary.  I spent 10 minutes counseling the patient face to face. The total time spent in the appointment was 15 minutes.    Truitt Merle, MD 11/07/2015 2:55 PM

## 2015-11-08 ENCOUNTER — Telehealth: Payer: Self-pay | Admitting: *Deleted

## 2015-11-08 NOTE — Telephone Encounter (Signed)
Per staff message and POF I have scheduled appts. Advised scheduler of appts. JMW  

## 2015-11-10 ENCOUNTER — Encounter: Payer: Self-pay | Admitting: Hematology

## 2015-11-21 ENCOUNTER — Ambulatory Visit (INDEPENDENT_AMBULATORY_CARE_PROVIDER_SITE_OTHER): Payer: Commercial Managed Care - HMO | Admitting: Nurse Practitioner

## 2015-11-21 ENCOUNTER — Encounter: Payer: Self-pay | Admitting: Nurse Practitioner

## 2015-11-21 VITALS — BP 132/70 | HR 64 | Temp 97.8°F | Ht 66.0 in | Wt 126.4 lb

## 2015-11-21 DIAGNOSIS — I10 Essential (primary) hypertension: Secondary | ICD-10-CM

## 2015-11-21 DIAGNOSIS — F329 Major depressive disorder, single episode, unspecified: Secondary | ICD-10-CM

## 2015-11-21 DIAGNOSIS — N3946 Mixed incontinence: Secondary | ICD-10-CM

## 2015-11-21 DIAGNOSIS — G629 Polyneuropathy, unspecified: Secondary | ICD-10-CM | POA: Diagnosis not present

## 2015-11-21 DIAGNOSIS — M1990 Unspecified osteoarthritis, unspecified site: Secondary | ICD-10-CM | POA: Diagnosis not present

## 2015-11-21 DIAGNOSIS — F418 Other specified anxiety disorders: Secondary | ICD-10-CM | POA: Diagnosis not present

## 2015-11-21 DIAGNOSIS — R634 Abnormal weight loss: Secondary | ICD-10-CM

## 2015-11-21 DIAGNOSIS — F419 Anxiety disorder, unspecified: Secondary | ICD-10-CM

## 2015-11-21 MED ORDER — MIRABEGRON ER 50 MG PO TB24: 50.0000 mg | ORAL_TABLET | Freq: Every day | ORAL | Status: DC

## 2015-11-21 NOTE — Patient Instructions (Signed)
Follow up in 6 months   Call and let us know about myrbetriq   To get lab work at hematologist.

## 2015-11-21 NOTE — Progress Notes (Signed)
Patient ID: Denise Lane, female   DOB: 09/13/1930, 82 y.o.   MRN: [id number]    PCP: Estill Dooms, MD  Advanced Directive information Does patient have an advance directive?: Yes, Type of Advance Directive: [place], Does patient want to make changes to advanced directive?: No - Patient declined  Allergies  Allergen Reactions  . Codeine Nausea And Vomiting  . Dilaudid [Hydromorphone Hcl] Hives and Other (See Comments)    "whelps" (10/18/2012)  . Macrodantin Hives and Other (See Comments)    "drug fever; chills; felt terrible" (10/18/2012)  . Morphine And Related Hives and Nausea And Vomiting  . Sulfa Antibiotics Hives, Rash and Other (See Comments)    "got real sick" (10/18/2012)    Chief Complaint  Patient presents with  . Medical Management of Chronic Issues    4 month follow-up for routine visit     HPI: Patient is a 82 y.o. female seen in the office today for routine follow up. Pt with hx of OA, HTN, hemochromatosis, OAB, constipation, depression. Daughter with pt at visit. Doing well. Pain is better. Hip does not bother her. If she runs around all day then it will get worse, when she sits down it will improve.  Following with hematology due to hemochromatosis.  Mood has been good. Taking gabapentin 300 mg at night only Was taken off mybetriq due to cost- now on oxybutynin but has dry mouth. Has different insurance now.   Review of Systems:  Review of Systems  Constitutional: Negative for fever, chills, diaphoresis, activity change, appetite change, fatigue and unexpected weight change.  HENT: Negative for congestion, ear discharge, ear pain, hearing loss, nosebleeds, postnasal drip, rhinorrhea, sore throat, tinnitus, trouble swallowing and voice change.   Eyes: Negative for pain, redness, itching and visual disturbance.  Respiratory: Negative for cough, choking, shortness of breath and wheezing.   Cardiovascular: Negative for chest pain, palpitations  and leg swelling.  Gastrointestinal: Negative for nausea, vomiting, abdominal pain, diarrhea, constipation, blood in stool and abdominal distention.  Endocrine: Negative for cold intolerance, heat intolerance, polydipsia, polyphagia and polyuria.  Genitourinary: Positive for frequency (with urinary incontience). Negative for dysuria, urgency, hematuria, flank pain, vaginal discharge, difficulty urinating and pelvic pain.  Musculoskeletal: Positive for back pain. Negative for myalgias, arthralgias, gait problem, neck pain and neck stiffness.  Skin: Negative for color change, pallor and rash.  Allergic/Immunologic: Negative.   Neurological: Negative for dizziness, tremors, seizures, syncope, weakness, numbness and headaches.  Hematological: Negative for adenopathy. Does not bruise/bleed easily.  Psychiatric/Behavioral: Negative for suicidal ideas, hallucinations, behavioral problems, confusion, sleep disturbance, dysphoric mood and agitation. The patient is not nervous/anxious and is not hyperactive.        Anxiety and depressive symptoms have improved since on Effexor     Past Medical History  Diagnosis Date  . Hemochromatosis 1987  . Stenosis of esophagus   . Dysphagia   . Muscle pain   . History of colonic polyps     NOTED 11-20-09 IN COLONOSCOPY REPORT  . Diverticulosis of sigmoid colon 11-20-2009  . Hemorrhoids, internal 11-20-09  . Hyperlipidemia     no meds required  . Tremor, essential 10-29-09  . Hemochromatosis   . PONV (postoperative nausea and vomiting)   . History of bronchitis 1991-1996    "chronic; related to winter" (10/18/2012)  . History of blood transfusion     "S/P colonoscopy after polyp removed; got 2 units; esophageal bleed got 1 unit; really messed up hemochromatosis" (1/7/20214)  .  Occasional tremors   . Joint pain   . Joint swelling   . Bruises easily   . H/O hiatal hernia   . Gastric ulcer   . Vomiting     occasionally  . Urinary urgency     takes Ditropan  daily  . Osteoporosis   . Dry eyes     uses Refresh eye drops daily as needed  . Rheumatic fever     hx of  . Osteoarthritis   . Osteoarthritis of right hip 10/18/2012  . Chronic lower back pain   . Basal cell cancer     "face, legs" (10/18/2012)  . Squamous carcinoma ([place])     "LLE" (10/18/2012)  . Hypertension     takes Metoprolol and Losartan daily  . Depression     takes Effexor daily  . GERD (gastroesophageal reflux disease)     takes Omeprazole daily  . Overactive bladder     takes Myrbetriq daily  . Muscle spasm     takes Robaxin daily as needed   Past Surgical History  Procedure Laterality Date  . Tonsillectomy  1953  . Appendectomy  1952  . Cholecystectomy  1977  . Tubal ligation  1975  . Lumbar disc surgery  1989  . Shoulder arthroscopy w/ rotator cuff repair  1998?    "left" (10/18/2012)  . Dilation and curettage of uterus  1975  . Cataract extraction w/ intraocular lens  implant, bilateral  2001    "both eyes" (10/18/2012)  . Esophagogastroduodenoscopy    . Colonoscopy    . Total hip arthroplasty  10/18/2012    "right" (10/18/2012)  . Skin cancer excision      "multiple" (10/18/2012)  . Total hip arthroplasty  10/18/2012    Procedure: TOTAL HIP ARTHROPLASTY;  Surgeon: Johnny Bridge, MD;  Location: [facility];  Service: Orthopedics;  Laterality: Right;  . Total hip arthroplasty Left 07/24/2014    dr Mardelle Matte  . Total hip arthroplasty Left 07/24/2014    Procedure: LEFT TOTAL HIP ARTHROPLASTY;  Surgeon: Johnny Bridge, MD;  Location: [facility];  Service: Orthopedics;  Laterality: Left;   Social History:   reports that she has never smoked. She has never used smokeless tobacco. She reports that she does not drink alcohol or use illicit drugs.  Family History  Problem Relation Age of Onset  . Hyperlipidemia Daughter     Medications: Patient's Medications  New Prescriptions   No medications on file  Previous Medications   ACETAMINOPHEN (TYLENOL) 650 MG CR TABLET    Take 650  mg by mouth 2 (two) times daily.    CALCIUM CITRATE (CALCITRATE - DOSED IN MG ELEMENTAL CALCIUM) 950 MG TABLET    Take 1-2 tablets by mouth 3 (three) times daily. 2 tablets in am and 1 tablet with lunch and supper   CARBOXYMETHYLCELLULOSE (REFRESH PLUS) 0.5 % SOLN    Place 1 drop into both eyes 3 (three) times daily as needed. For dry eyes   CHOLECALCIFEROL (VITAMIN D) 1000 UNITS TABLET    Take 1,000 Units by mouth 2 (two) times daily.   GABAPENTIN (NEURONTIN) 300 MG CAPSULE    Take one capsule by mouth every 8 hours as needed for nerve pain   LOSARTAN (COZAAR) 50 MG TABLET    Take 1 tablet (50 mg total) by mouth daily.   METOPROLOL TARTRATE (LOPRESSOR) 25 MG TABLET    Take 1 tablet (25 mg total) by mouth daily.   OMEPRAZOLE (PRILOSEC) 20 MG CAPSULE  Take 20 mg by mouth daily.   OXYBUTYNIN (DITROPAN) 5 MG TABLET    Take 1 tablet (5 mg total) by mouth 2 (two) times daily.   VENLAFAXINE XR (EFFEXOR-XR) 37.5 MG 24 HR CAPSULE    Take 1 capsule (37.5 mg total) by mouth daily with breakfast.  Modified Medications   No medications on file  Discontinued Medications   No medications on file     Physical Exam:  Filed Vitals:   11/21/15 1358  BP: 132/70  Pulse: 64  Temp: 97.8 F (36.6 C)  TempSrc: Oral  Height: 5\' 6"  (1.676 m)  Weight: 126 lb 6.4 oz (57.335 kg)  SpO2: 95%   Body mass index is 20.41 kg/(m^2).  Physical Exam  Constitutional: She is oriented to person, place, and time. She appears well-developed and well-nourished.  HENT:  Head: Normocephalic and atraumatic.  Eyes: Conjunctivae and EOM are normal. Pupils are equal, round, and reactive to light.  Neck: Normal range of motion. Neck supple.  Cardiovascular: Normal rate, regular rhythm, normal heart sounds and intact distal pulses.   Pulmonary/Chest: Effort normal and breath sounds normal.  Abdominal: Soft. Bowel sounds are normal.  Musculoskeletal: She exhibits no edema or tenderness.  Neurological: She is alert and  oriented to person, place, and time.  Skin: Skin is warm and dry.  Psychiatric: She has a normal mood and affect.    Labs reviewed: Basic Metabolic Panel:  Recent Labs  01/14/15 1453 07/16/15 1414  NA 142 141  K 3.9 4.5  CL 101 97  CO2 27 27  GLUCOSE 116* 112*  BUN 20 24  CREATININE 0.67 0.66  CALCIUM 9.6 9.8  MG  --  2.2   Liver Function Tests:  Recent Labs  01/14/15 1453 07/16/15 1414  AST 12 16  ALT 12 12  ALKPHOS 98 104  BILITOT 0.4 0.3  PROT 6.4 6.5  ALBUMIN 4.1 4.2   No results for input(s): LIPASE, AMYLASE in the last 8760 hours. No results for input(s): AMMONIA in the last 8760 hours. CBC:  Recent Labs  01/21/15 1301 07/16/15 1414 07/22/15 1238 10/24/15 1322  WBC 6.9 6.9 7.0 6.6  NEUTROABS 3.7 3.5 3.7 3.6  HGB 12.8  --  13.9 12.5  HCT 38.0 38.7 40.8 36.7  MCV 96.7 98* 98.6 97.9  PLT 272  --  231 215   Lipid Panel:  Recent Labs  07/16/15 1414  CHOL 175  HDL 32*  LDLCALC 92  TRIG 253*  CHOLHDL 5.5*   TSH: No results for input(s): TSH in the last 8760 hours. A1C: No results found for: HGBA1C   Assessment/Plan 1. Essential hypertension Blood pressure stable, conts on cozaar 50 mg daily   2. Neuropathy (HCC) -stable on gabapentin 300 mg daily  3. Hemochromatosis -ongoing follow up by hematology  4. Osteoarthritis, unspecified osteoarthritis type, unspecified site Remains stable, no increase in pain. Pain well controled on current medication   5. Anxiety and depression -improved on effexor, will cont at this time   6. Mixed incontinence -will send Rx for mirabegron ER (MYRBETRIQ) 50 MG TB24 tablet; Take 1 tablet (50 mg total) by mouth daily.  Dispense: 90 tablet; Refill: 3 -however if too costly will cont on oxybutynin   7. Loss of weight Weight has remained stable, cont to monitor   Follow up in 6 months, sooner if needed   Jessica K. Harle Battiest  [facility] [phone] 8 am  - 5 pm) [phone] (after  hours)

## 2015-11-24 ENCOUNTER — Encounter: Payer: Self-pay | Admitting: Nurse Practitioner

## 2015-11-25 ENCOUNTER — Telehealth: Payer: Self-pay

## 2015-11-25 NOTE — Telephone Encounter (Signed)
Called and canceled myrbetriq at patients request because of cost spoke with [facility] pharmacist  there.

## 2016-01-17 DIAGNOSIS — Z1231 Encounter for screening mammogram for malignant neoplasm of breast: Secondary | ICD-10-CM | POA: Diagnosis not present

## 2016-01-17 DIAGNOSIS — H35372 Puckering of macula, left eye: Secondary | ICD-10-CM | POA: Diagnosis not present

## 2016-01-17 DIAGNOSIS — H40013 Open angle with borderline findings, low risk, bilateral: Secondary | ICD-10-CM | POA: Diagnosis not present

## 2016-01-17 DIAGNOSIS — H353130 Nonexudative age-related macular degeneration, bilateral, stage unspecified: Secondary | ICD-10-CM | POA: Diagnosis not present

## 2016-01-17 DIAGNOSIS — Z961 Presence of intraocular lens: Secondary | ICD-10-CM | POA: Diagnosis not present

## 2016-01-17 DIAGNOSIS — H35033 Hypertensive retinopathy, bilateral: Secondary | ICD-10-CM | POA: Diagnosis not present

## 2016-01-17 LAB — HM MAMMOGRAPHY

## 2016-01-21 ENCOUNTER — Encounter: Payer: Self-pay | Admitting: *Deleted

## 2016-01-27 ENCOUNTER — Encounter: Payer: Self-pay | Admitting: Hematology

## 2016-01-29 ENCOUNTER — Encounter: Payer: Self-pay | Admitting: Hematology

## 2016-01-30 ENCOUNTER — Other Ambulatory Visit: Payer: Self-pay | Admitting: *Deleted

## 2016-02-05 ENCOUNTER — Other Ambulatory Visit (HOSPITAL_BASED_OUTPATIENT_CLINIC_OR_DEPARTMENT_OTHER): Payer: Commercial Managed Care - HMO

## 2016-02-05 ENCOUNTER — Other Ambulatory Visit: Payer: Self-pay | Admitting: *Deleted

## 2016-02-05 DIAGNOSIS — I10 Essential (primary) hypertension: Secondary | ICD-10-CM | POA: Diagnosis not present

## 2016-02-05 LAB — CBC WITH DIFFERENTIAL/PLATELET
BASO%: 0.3 % (ref 0.0–2.0)
Basophils Absolute: 0 10*3/uL (ref 0.0–0.1)
EOS%: 1.8 % (ref 0.0–7.0)
Eosinophils Absolute: 0.1 10*3/uL (ref 0.0–0.5)
HCT: 38.4 % (ref 34.8–46.6)
HGB: 13 g/dL (ref 11.6–15.9)
LYMPH%: 33.8 % (ref 14.0–49.7)
MCH: 33.1 pg (ref 25.1–34.0)
MCHC: 33.9 g/dL (ref 31.5–36.0)
MCV: 97.7 fL (ref 79.5–101.0)
MONO#: 0.7 10*3/uL (ref 0.1–0.9)
MONO%: 9.1 % (ref 0.0–14.0)
NEUT#: 4 10*3/uL (ref 1.5–6.5)
NEUT%: 55 % (ref 38.4–76.8)
PLATELETS: 216 10*3/uL (ref 145–400)
RBC: 3.93 10*6/uL (ref 3.70–5.45)
RDW: 12.1 % (ref 11.2–14.5)
WBC: 7.2 10*3/uL (ref 3.9–10.3)
lymph#: 2.4 10*3/uL (ref 0.9–3.3)

## 2016-02-05 LAB — BASIC METABOLIC PANEL
BUN: 27 mg/dL — AB (ref 4–21)
CREATININE: 0.6 mg/dL (ref 0.5–1.1)
GLUCOSE: 91 mg/dL
Potassium: 4.4 mmol/L (ref 3.4–5.3)
Sodium: 140 mmol/L (ref 137–147)

## 2016-02-05 LAB — IRON AND TIBC
%SAT: 81 % — ABNORMAL HIGH (ref 21–57)
Iron: 189 ug/dL — ABNORMAL HIGH (ref 41–142)
TIBC: 234 ug/dL — AB (ref 236–444)
UIBC: 45 ug/dL — ABNORMAL LOW (ref 120–384)

## 2016-02-05 LAB — FERRITIN: Ferritin: 85 ng/ml (ref 9–269)

## 2016-02-11 ENCOUNTER — Telehealth: Payer: Self-pay | Admitting: Hematology

## 2016-02-11 ENCOUNTER — Other Ambulatory Visit: Payer: Self-pay | Admitting: Hematology

## 2016-02-11 NOTE — Telephone Encounter (Signed)
per pof to sch pt appt-sent [facility] emailto sch pt trmt-will call pt after r

## 2016-02-12 ENCOUNTER — Telehealth: Payer: Self-pay | Admitting: *Deleted

## 2016-02-12 ENCOUNTER — Telehealth: Payer: Self-pay | Admitting: Hematology

## 2016-02-12 NOTE — Telephone Encounter (Signed)
cld & spoke to pt daughter in re to appt 5/8 @ 2:30-daughter understood

## 2016-02-12 NOTE — Telephone Encounter (Signed)
Per staff message and POF I have scheduled appts. Advised scheduler of appts. JMW  

## 2016-02-19 ENCOUNTER — Ambulatory Visit (HOSPITAL_BASED_OUTPATIENT_CLINIC_OR_DEPARTMENT_OTHER): Payer: Commercial Managed Care - HMO

## 2016-02-19 DIAGNOSIS — L820 Inflamed seborrheic keratosis: Secondary | ICD-10-CM | POA: Diagnosis not present

## 2016-02-19 DIAGNOSIS — L570 Actinic keratosis: Secondary | ICD-10-CM | POA: Diagnosis not present

## 2016-02-19 DIAGNOSIS — D1801 Hemangioma of skin and subcutaneous tissue: Secondary | ICD-10-CM | POA: Diagnosis not present

## 2016-02-19 DIAGNOSIS — L853 Xerosis cutis: Secondary | ICD-10-CM | POA: Diagnosis not present

## 2016-02-19 DIAGNOSIS — D235 Other benign neoplasm of skin of trunk: Secondary | ICD-10-CM | POA: Diagnosis not present

## 2016-02-19 DIAGNOSIS — L814 Other melanin hyperpigmentation: Secondary | ICD-10-CM | POA: Diagnosis not present

## 2016-02-19 DIAGNOSIS — Z85828 Personal history of other malignant neoplasm of skin: Secondary | ICD-10-CM | POA: Diagnosis not present

## 2016-02-19 MED ORDER — SODIUM CHLORIDE 0.9 % IV SOLN: Freq: Once | INTRAVENOUS | Status: DC

## 2016-02-19 NOTE — Patient Instructions (Signed)

## 2016-04-06 IMAGING — CR DG PORTABLE PELVIS
2 series · 2 of 2 positions shown · non-contrast
Comparison: 04/07/2014

CLINICAL DATA: Postop left hip.

EXAM:
PORTABLE PELVIS 1-2 VIEWS

[AP (1 of 2)]
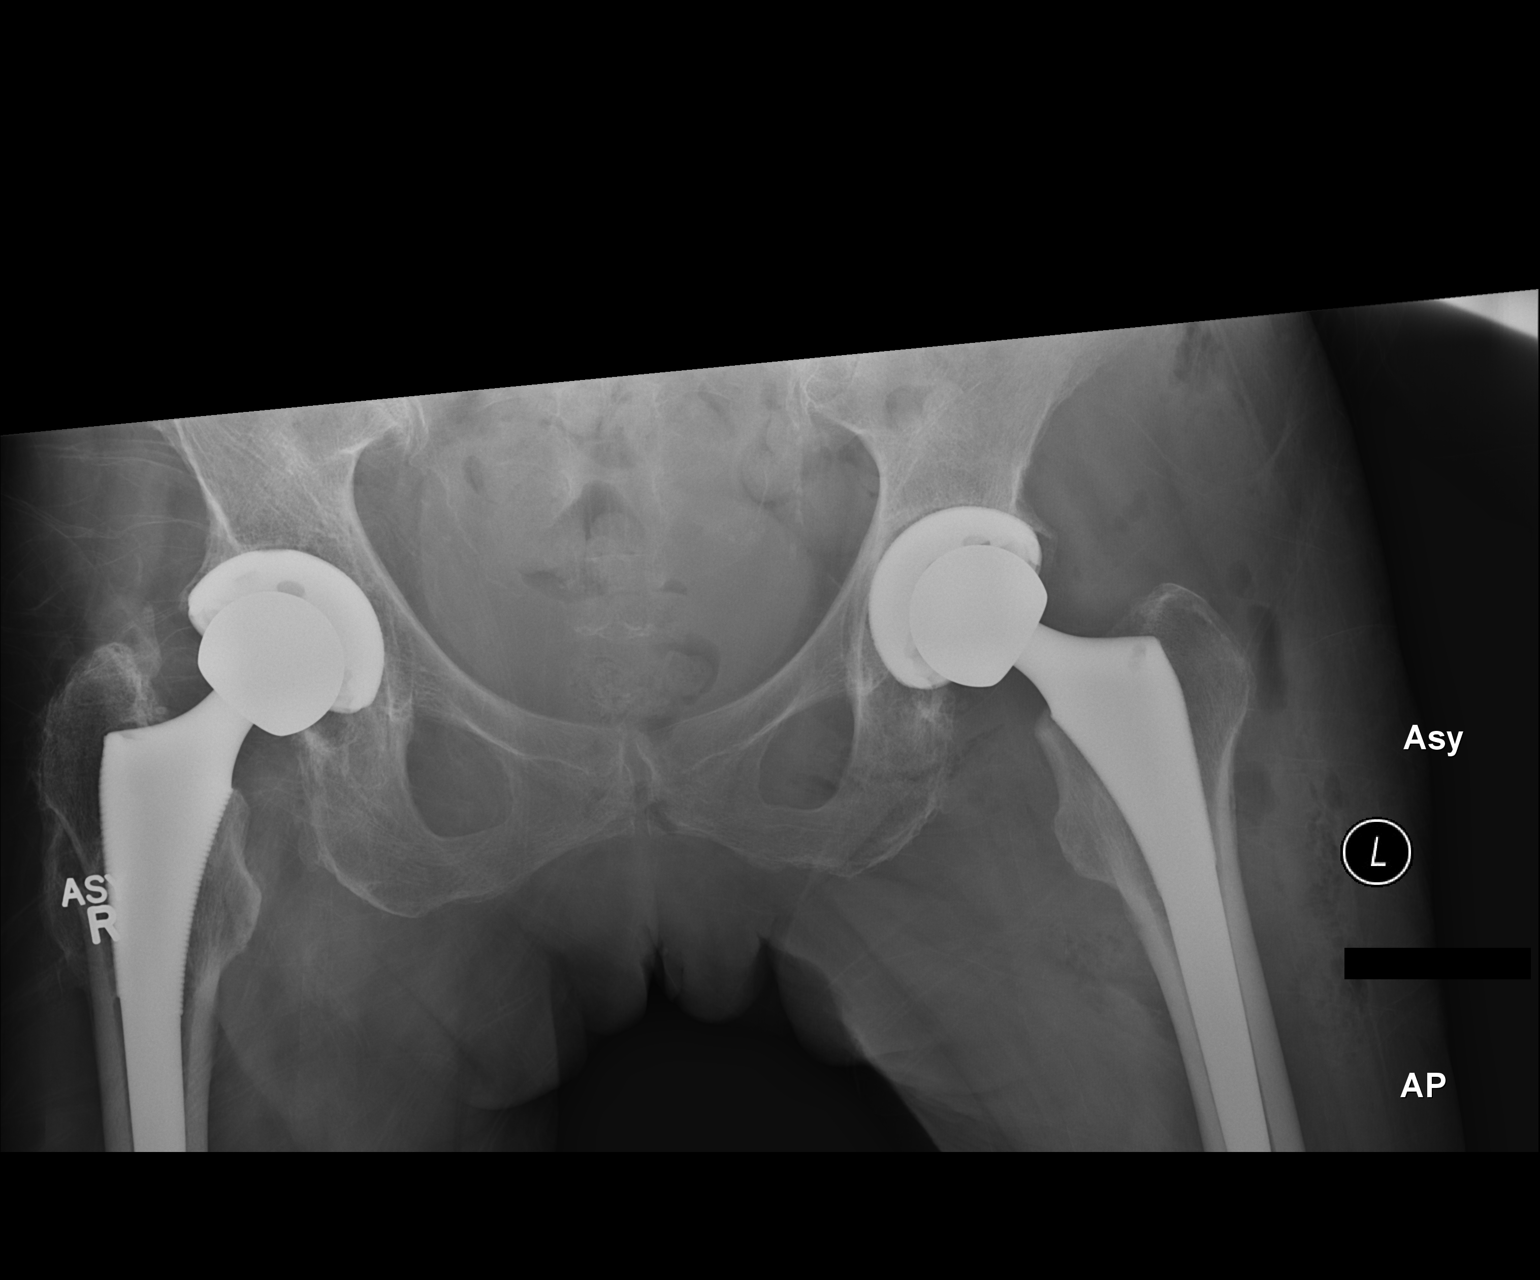

[AP (2 of 2)]
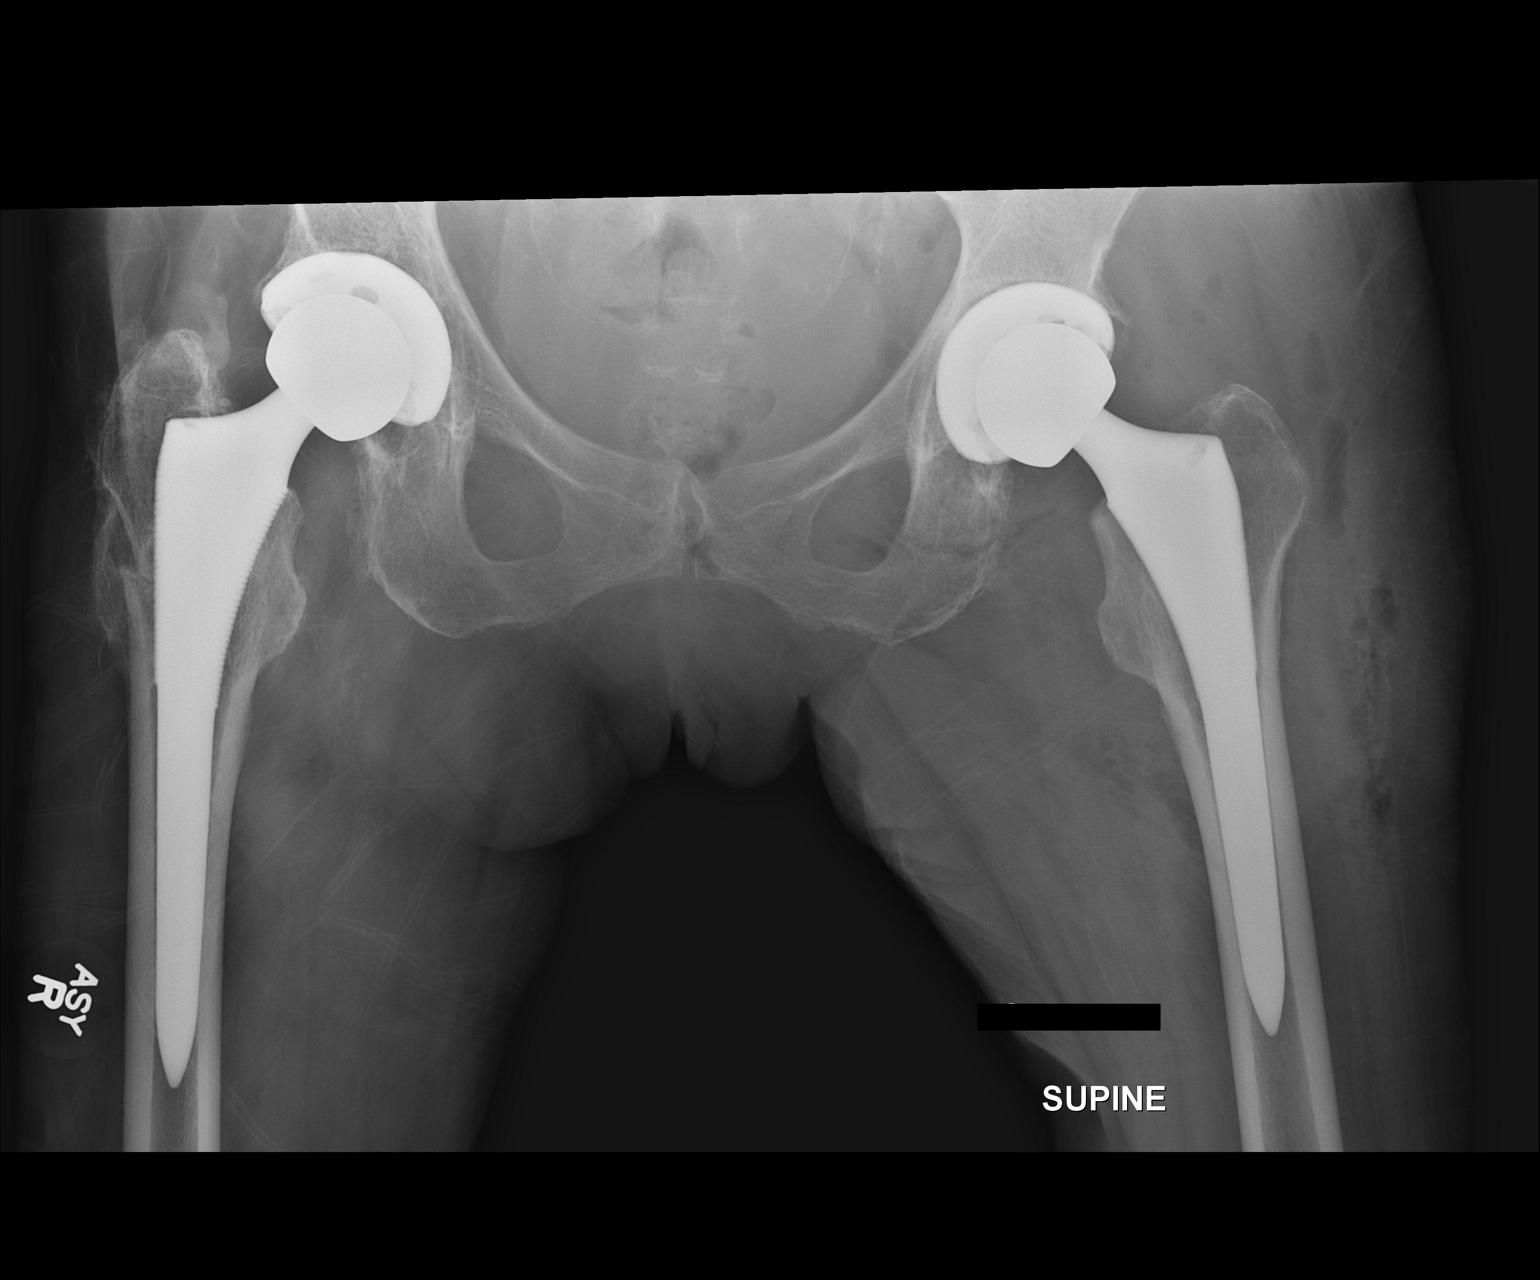

[2 of 2 positions shown; findings below may reference images not displayed]

FINDINGS: The patient has undergone interval left total hip arthroplasty.
Hardware appears intact. No evidence for dislocation on this frontal
view. Postoperative subcutaneous gas is present. No acute fracture
identified.
IMPRESSION: Status post total hip arthroplasty.  No adverse features identified.

## 2016-04-06 IMAGING — CR DG HIP 1V PORT*L*
2 series · 2 of 2 positions shown · non-contrast
Comparison: March 28, 2014.

CLINICAL DATA: Status post left hip arthroplasty.

EXAM:
PORTABLE LEFT HIP - 1 VIEW

[AP]
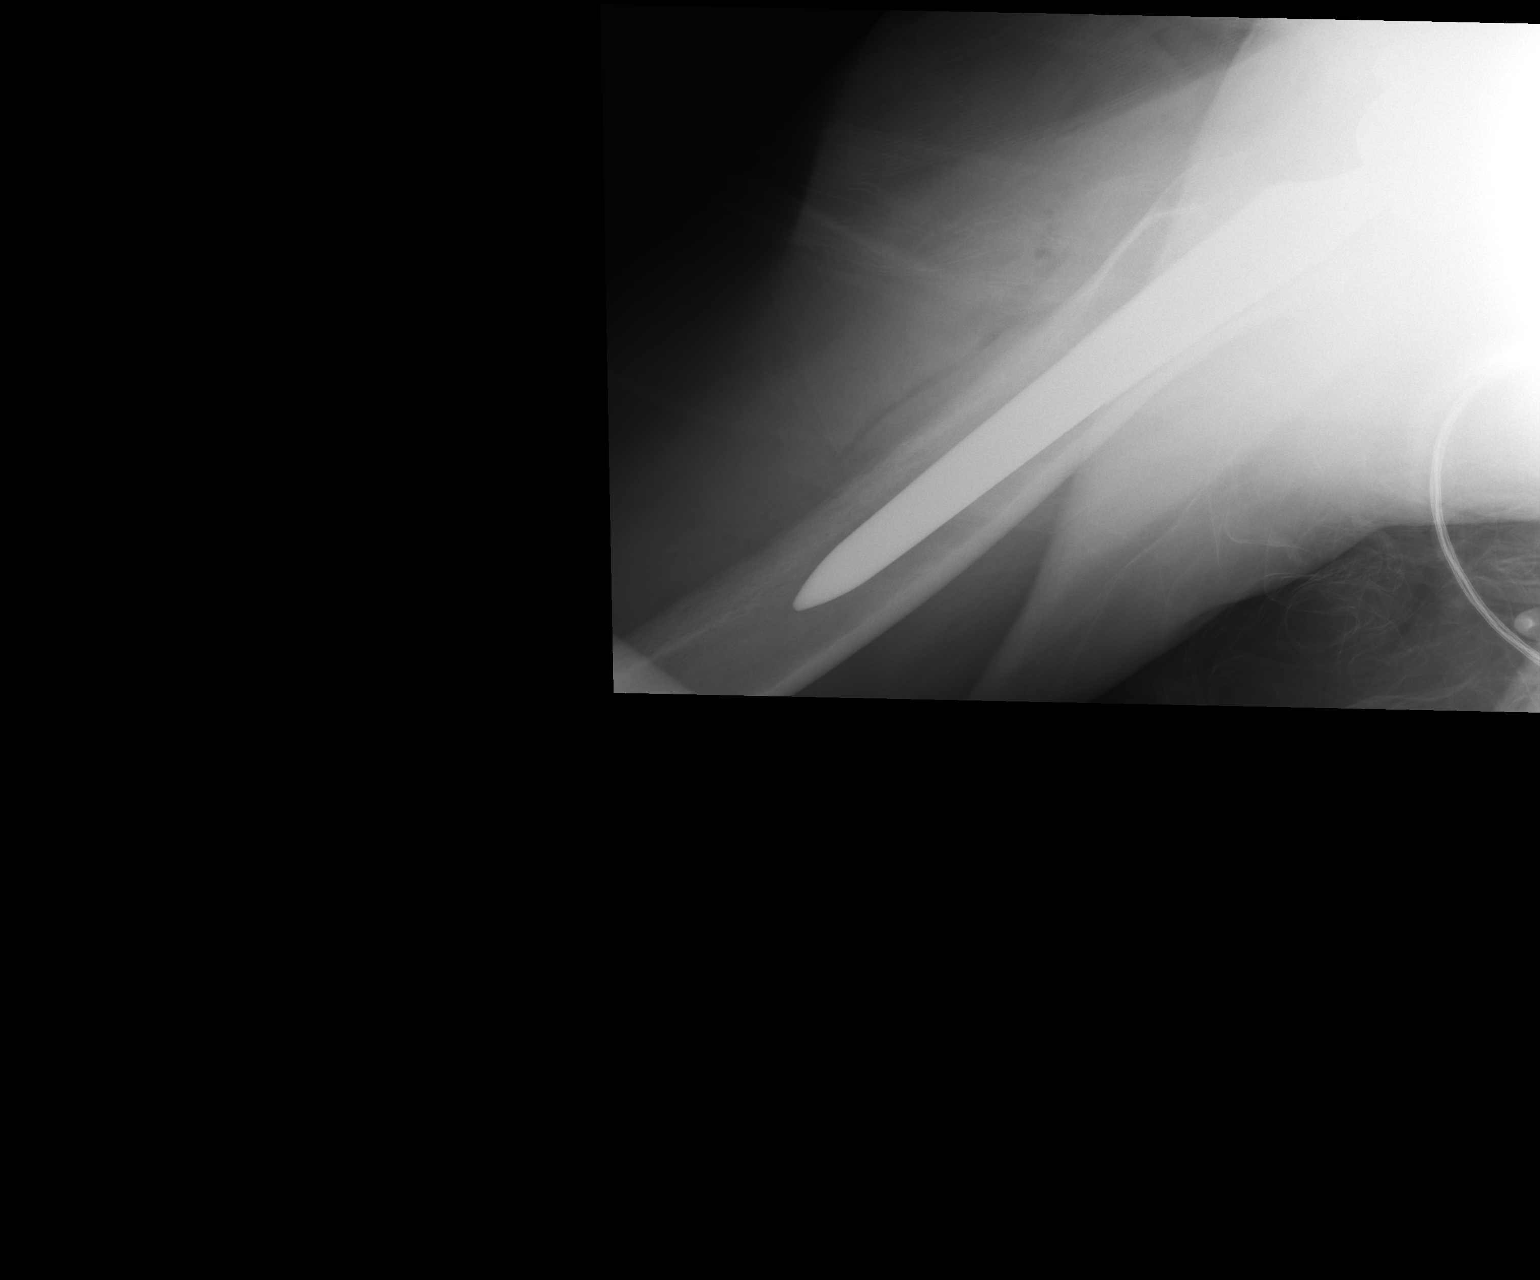

[lateral]
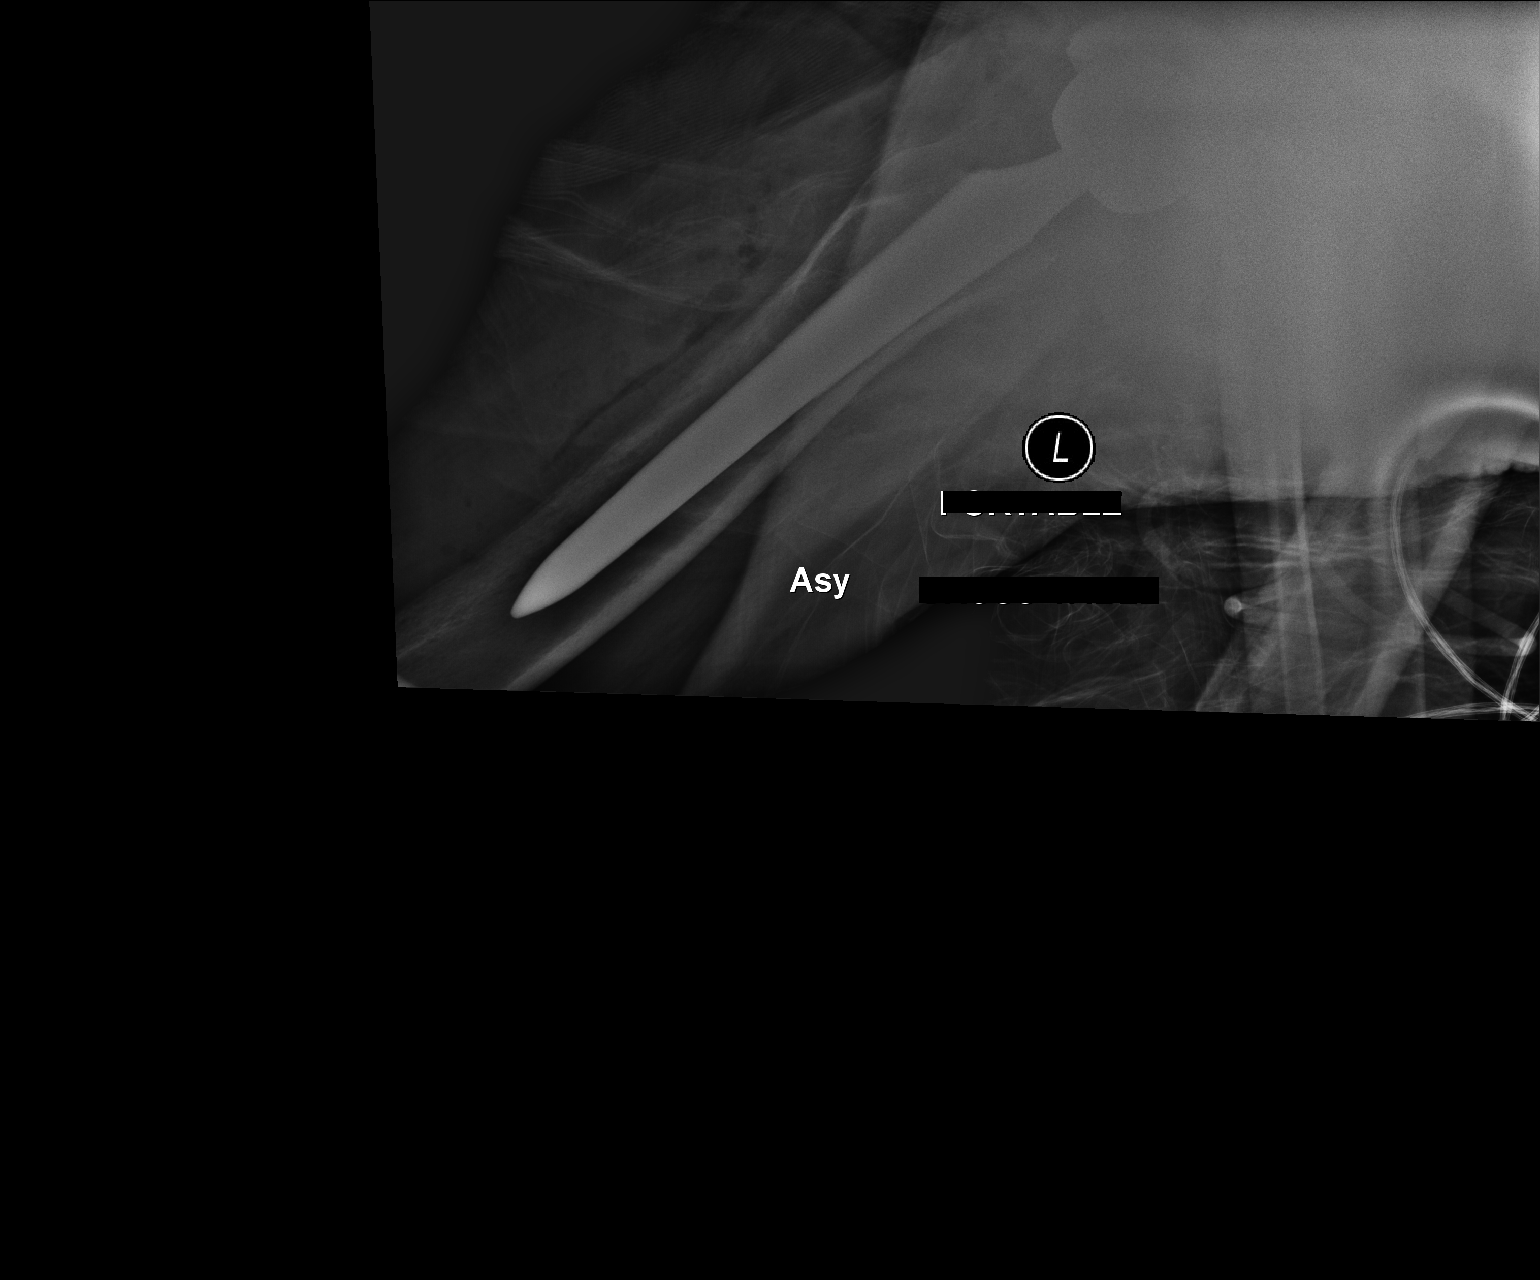

[2 of 2 positions shown; findings below may reference images not displayed]

FINDINGS: Status post left hip arthroplasty. The femoral and acetabular
components appear well situated. No fracture dislocation is noted.
IMPRESSION: Status post left hip arthroplasty.

## 2016-05-06 ENCOUNTER — Other Ambulatory Visit: Payer: Commercial Managed Care - HMO

## 2016-05-06 ENCOUNTER — Other Ambulatory Visit (HOSPITAL_BASED_OUTPATIENT_CLINIC_OR_DEPARTMENT_OTHER): Payer: Commercial Managed Care - HMO

## 2016-05-06 LAB — FERRITIN: FERRITIN: 83 ng/mL (ref 9–269)

## 2016-05-06 LAB — CBC WITH DIFFERENTIAL/PLATELET
BASO%: 0.3 % (ref 0.0–2.0)
Basophils Absolute: 0 10*3/uL (ref 0.0–0.1)
EOS%: 1.8 % (ref 0.0–7.0)
Eosinophils Absolute: 0.1 10*3/uL (ref 0.0–0.5)
HCT: 37.6 % (ref 34.8–46.6)
HGB: 12.8 g/dL (ref 11.6–15.9)
LYMPH#: 2.6 10*3/uL (ref 0.9–3.3)
LYMPH%: 35.1 % (ref 14.0–49.7)
MCH: 33.4 pg (ref 25.1–34.0)
MCHC: 34 g/dL (ref 31.5–36.0)
MCV: 98.2 fL (ref 79.5–101.0)
MONO#: 0.8 10*3/uL (ref 0.1–0.9)
MONO%: 10.3 % (ref 0.0–14.0)
NEUT#: 3.9 10*3/uL (ref 1.5–6.5)
NEUT%: 52.5 % (ref 38.4–76.8)
Platelets: 217 10*3/uL (ref 145–400)
RBC: 3.83 10*6/uL (ref 3.70–5.45)
RDW: 12.2 % (ref 11.2–14.5)
WBC: 7.4 10*3/uL (ref 3.9–10.3)

## 2016-05-06 LAB — IRON AND TIBC
%SAT: 83 % — ABNORMAL HIGH (ref 21–57)
IRON: 188 ug/dL — AB (ref 41–142)
TIBC: 228 ug/dL — AB (ref 236–444)
UIBC: 39 ug/dL — ABNORMAL LOW (ref 120–384)

## 2016-05-08 ENCOUNTER — Telehealth: Payer: Self-pay | Admitting: Hematology

## 2016-05-08 NOTE — Telephone Encounter (Signed)
PT CALLED TO [facility] PHLEBOTOMY APT

## 2016-05-12 ENCOUNTER — Encounter: Payer: Self-pay | Admitting: Hematology

## 2016-05-13 ENCOUNTER — Ambulatory Visit: Payer: Commercial Managed Care - HMO

## 2016-05-14 ENCOUNTER — Ambulatory Visit (INDEPENDENT_AMBULATORY_CARE_PROVIDER_SITE_OTHER): Payer: Commercial Managed Care - HMO | Admitting: Nurse Practitioner

## 2016-05-14 ENCOUNTER — Encounter: Payer: Self-pay | Admitting: Nurse Practitioner

## 2016-05-14 VITALS — BP 118/64 | HR 65 | Temp 97.7°F | Resp 18 | Ht 66.0 in | Wt 123.0 lb

## 2016-05-14 DIAGNOSIS — F32A Depression, unspecified: Secondary | ICD-10-CM

## 2016-05-14 DIAGNOSIS — Z23 Encounter for immunization: Secondary | ICD-10-CM | POA: Diagnosis not present

## 2016-05-14 DIAGNOSIS — G629 Polyneuropathy, unspecified: Secondary | ICD-10-CM

## 2016-05-14 DIAGNOSIS — M1990 Unspecified osteoarthritis, unspecified site: Secondary | ICD-10-CM | POA: Diagnosis not present

## 2016-05-14 DIAGNOSIS — I10 Essential (primary) hypertension: Secondary | ICD-10-CM

## 2016-05-14 DIAGNOSIS — E785 Hyperlipidemia, unspecified: Secondary | ICD-10-CM | POA: Diagnosis not present

## 2016-05-14 DIAGNOSIS — F418 Other specified anxiety disorders: Secondary | ICD-10-CM | POA: Diagnosis not present

## 2016-05-14 DIAGNOSIS — F419 Anxiety disorder, unspecified: Secondary | ICD-10-CM

## 2016-05-14 DIAGNOSIS — F329 Major depressive disorder, single episode, unspecified: Secondary | ICD-10-CM

## 2016-05-14 MED ORDER — GABAPENTIN 300 MG PO CAPS: ORAL_CAPSULE | ORAL | 3 refills | Status: DC

## 2016-05-14 NOTE — Progress Notes (Signed)
PCP: [name], MD  Advanced Directive information Does patient have an advance directive?: Yes, Type of Advance Directive: [place];Living will  Allergies  Allergen Reactions  . Codeine Nausea And Vomiting  . Dilaudid [Hydromorphone Hcl] Hives and Other (See Comments)    "whelps" (10/18/2012)  . Macrodantin Hives and Other (See Comments)    "drug fever; chills; felt terrible" (10/18/2012)  . Morphine And Related Hives and Nausea And Vomiting  . Sulfa Antibiotics Hives, Rash and Other (See Comments)    "got real sick" (10/18/2012)    Chief Complaint  Patient presents with  . Medical Management of Chronic Issues    6 month follow up  . Other    Daughter, [name], with patient     HPI: Patient is a 82 y.o. female seen in the office today for routine follow up. Pt with hx of OA, UI and OAB, hyperlipidemia, htn, neuropathy, anxiety and depression, GERD  Having to take ditropan 5 mg due to cost of myrbetriq -- having dry mouth as side effect of ditropan but using mouthwash to help with that.   Bilateral total hip replacement- pain is good. Some pain noted to right knee  Neuropathy controlled on gabapentin- only needing 1 tablet a day.   Anxiety and depression- husband has dementia and she has to do everything at the house- he does not have motivation and this upsets her.  conts on effexor   Eating well, good appetite.   conts to follow up with hematology due to hemochromatosis   Review of Systems:  Review of Systems  Constitutional: Negative for activity change, appetite change, chills, diaphoresis, fatigue, fever and unexpected weight change.  HENT: Negative for congestion, ear discharge, ear pain, hearing loss, nosebleeds, postnasal drip and rhinorrhea.   Eyes: Negative for pain, redness, itching and visual disturbance.  Respiratory: Negative for cough, choking, shortness of breath and wheezing.   Cardiovascular: Negative for chest pain, palpitations and  leg swelling.  Gastrointestinal: Negative for abdominal distention, abdominal pain, blood in stool, constipation, diarrhea, nausea and vomiting.  Endocrine: Negative for cold intolerance, heat intolerance, polydipsia, polyphagia and polyuria.  Genitourinary: Positive for frequency (with urinary incontience). Negative for difficulty urinating, dysuria, flank pain, hematuria, pelvic pain, urgency and vaginal discharge.  Musculoskeletal: Positive for back pain. Negative for arthralgias, gait problem, myalgias, neck pain and neck stiffness.  Skin: Negative for color change, pallor and rash.  Allergic/Immunologic: Positive for environmental allergies.  Neurological: Negative for dizziness, tremors, weakness, numbness and headaches.  Hematological: Negative for adenopathy. Does not bruise/bleed easily.  Psychiatric/Behavioral: Negative for agitation, behavioral problems, confusion, dysphoric mood, hallucinations, sleep disturbance and suicidal ideas. The patient is not nervous/anxious and is not hyperactive.        Anxiety and depressive symptoms have improved since on Effexor     Past Medical History:  Diagnosis Date  . Basal cell cancer    "face, legs" (10/18/2012)  . Bruises easily   . Chronic lower back pain   . Depression    takes Effexor daily  . Diverticulosis of sigmoid colon 11-20-2009  . Dry eyes    uses Refresh eye drops daily as needed  . Dysphagia   . Gastric ulcer   . GERD (gastroesophageal reflux disease)    takes Omeprazole daily  . H/O hiatal hernia   . Hemochromatosis 1987  . Hemochromatosis   . Hemorrhoids, internal 11-20-09  . History of blood transfusion    "S/P colonoscopy after polyp removed; got  2 units; esophageal bleed got 1 unit; really messed up hemochromatosis" (1/7/20214)  . History of bronchitis 1991-1996   "chronic; related to winter" (10/18/2012)  . History of colonic polyps    NOTED 11-20-09 IN COLONOSCOPY REPORT  . Hyperlipidemia    no meds required  .  Hypertension    takes Metoprolol and Losartan daily  . Joint pain   . Joint swelling   . Muscle pain   . Muscle spasm    takes Robaxin daily as needed  . Occasional tremors   . Osteoarthritis   . Osteoarthritis of right hip 10/18/2012  . Osteoporosis   . Overactive bladder    takes Myrbetriq daily  . PONV (postoperative nausea and vomiting)   . Rheumatic fever    hx of  . Squamous carcinoma ([place])    "LLE" (10/18/2012)  . Stenosis of esophagus   . Tremor, essential 10-29-09  . Urinary urgency    takes Ditropan daily  . Vomiting    occasionally   Past Surgical History:  Procedure Laterality Date  . APPENDECTOMY  1952  . CATARACT EXTRACTION W/ INTRAOCULAR LENS  IMPLANT, BILATERAL  2001   "both eyes" (10/18/2012)  . CHOLECYSTECTOMY  1977  . COLONOSCOPY    . DILATION AND CURETTAGE OF UTERUS  1975  . ESOPHAGOGASTRODUODENOSCOPY    . [place]  . SHOULDER ARTHROSCOPY W/ ROTATOR CUFF REPAIR  1998?   "left" (10/18/2012)  . SKIN CANCER EXCISION     "multiple" (10/18/2012)  . TONSILLECTOMY  1953  . TOTAL HIP ARTHROPLASTY  10/18/2012   "right" (10/18/2012)  . TOTAL HIP ARTHROPLASTY  10/18/2012   Procedure: TOTAL HIP ARTHROPLASTY;  Surgeon: Johnny Bridge, MD;  Location: [facility];  Service: Orthopedics;  Laterality: Right;  . TOTAL HIP ARTHROPLASTY Left 07/24/2014   dr Mardelle Matte  . TOTAL HIP ARTHROPLASTY Left 07/24/2014   Procedure: LEFT TOTAL HIP ARTHROPLASTY;  Surgeon: Johnny Bridge, MD;  Location: [facility];  Service: Orthopedics;  Laterality: Left;  . TUBAL LIGATION  1975   Social History:   reports that she has never smoked. She has never used smokeless tobacco. She reports that she does not drink alcohol or use drugs.  Family History  Problem Relation Age of Onset  . Hyperlipidemia Daughter     Medications: Patient's Medications  New Prescriptions   No medications on file  Previous Medications   ACETAMINOPHEN (TYLENOL) 650 MG CR TABLET    Take 650 mg by mouth 2 (two)  times daily.    CALCIUM CITRATE (CALCITRATE - DOSED IN MG ELEMENTAL CALCIUM) 950 MG TABLET    Take 1-2 tablets by mouth 3 (three) times daily. 2 tablets in am and 1 tablet with lunch and supper   CARBOXYMETHYLCELLULOSE (REFRESH PLUS) 0.5 % SOLN    Place 1 drop into both eyes 3 (three) times daily as needed. For dry eyes   CHOLECALCIFEROL (VITAMIN D) 1000 UNITS TABLET    Take 1,000 Units by mouth 2 (two) times daily.   GABAPENTIN (NEURONTIN) 300 MG CAPSULE    Take one capsule by mouth every 8 hours as needed for nerve pain   LOSARTAN (COZAAR) 50 MG TABLET    Take 1 tablet (50 mg total) by mouth daily.   METOPROLOL TARTRATE (LOPRESSOR) 25 MG TABLET    Take 1 tablet (25 mg total) by mouth daily.   OXYBUTYNIN (DITROPAN) 5 MG TABLET    Take 1 tablet (5 mg total) by mouth 2 (two) times  daily.   RANITIDINE (ZANTAC) 150 MG TABLET    Take 150 mg by mouth daily.   VENLAFAXINE XR (EFFEXOR-XR) 37.5 MG 24 HR CAPSULE    Take 1 capsule (37.5 mg total) by mouth daily with breakfast.  Modified Medications   No medications on file  Discontinued Medications   MIRABEGRON ER (MYRBETRIQ) 50 MG TB24 TABLET    Take 1 tablet (50 mg total) by mouth daily.   OMEPRAZOLE (PRILOSEC) 20 MG CAPSULE    Take 20 mg by mouth daily.     Physical Exam:  Vitals:   05/14/16 1455  BP: 118/64  Pulse: 65  Resp: 18  Temp: 97.7 F (36.5 C)  TempSrc: Oral  SpO2: 98%  Weight: 123 lb (55.8 kg)  Height: 5\' 6"  (1.676 m)   Body mass index is 19.85 kg/m.  Physical Exam  Constitutional: She is oriented to person, place, and time. She appears well-developed and well-nourished.  HENT:  Head: Normocephalic and atraumatic.  Eyes: Conjunctivae and EOM are normal. Pupils are equal, round, and reactive to light.  Neck: Normal range of motion. Neck supple.  Cardiovascular: Normal rate, regular rhythm and normal heart sounds.   Pulmonary/Chest: Effort normal and breath sounds normal.  Abdominal: Soft. Bowel sounds are normal.    Musculoskeletal: She exhibits no edema or tenderness.  Neurological: She is alert and oriented to person, place, and time.  Skin: Skin is warm and dry.  Psychiatric: She has a normal mood and affect.    Labs reviewed: Basic Metabolic Panel:  Recent Labs  07/16/15 1414 02/05/16  NA 141 140  K 4.5 4.4  CL 97  --   CO2 27  --   GLUCOSE 112*  --   BUN 24 27*  CREATININE 0.66 0.6  CALCIUM 9.8  --   MG 2.2  --    Liver Function Tests:  Recent Labs  07/16/15 1414  AST 16  ALT 12  ALKPHOS 104  BILITOT 0.3  PROT 6.5  ALBUMIN 4.2   No results for input(s): LIPASE, AMYLASE in the last 8760 hours. No results for input(s): AMMONIA in the last 8760 hours. CBC:  Recent Labs  10/24/15 1322 02/05/16 1250 05/06/16 1253  WBC 6.6 7.2 7.4  NEUTROABS 3.6 4.0 3.9  HGB 12.5 13.0 12.8  HCT 36.7 38.4 37.6  MCV 97.9 97.7 98.2  PLT 215 216 217   Lipid Panel:  Recent Labs  07/16/15 1414  CHOL 175  HDL 32*  LDLCALC 92  TRIG 253*  CHOLHDL 5.5*   TSH: No results for input(s): TSH in the last 8760 hours. A1C: No results found for: HGBA1C   Assessment/Plan 1. Neuropathy (HCC) -controlled on gabapentin - gabapentin (NEURONTIN) 300 MG capsule; Take one capsule by mouth every 12 hours as needed for nerve pain  Dispense: 60 capsule; Refill: 3  2. Osteoarthritis, unspecified osteoarthritis type, unspecified site Stable, conts on tylenol BID  3. Anxiety and depression Still with some anxiety and depression due to being primary caregiver for husband with dementia, however with improved control on effexor- will cont current regimen   4. Essential hypertension Controlled on current medications, cont current regimen   5. Hyperlipidemia Prefers to get labs done at hematology, Rx written for fasting lipids.   6. Hemochromatosis -ongoing follow up with hematology for follow up.   7. Need for pneumococcal vaccine - Pneumococcal conjugate vaccine 13-valent  Follow up in 6  months for EV with MMSE Jessica K. Harle Battiest  [facility] [phone] 8 am - 5 pm) [phone] (after hours)

## 2016-05-14 NOTE — Patient Instructions (Signed)
Follow up in 6 months for complete physical  To get blood work done at hematology

## 2016-05-18 ENCOUNTER — Other Ambulatory Visit: Payer: Self-pay | Admitting: Hematology

## 2016-05-21 ENCOUNTER — Telehealth: Payer: Self-pay | Admitting: *Deleted

## 2016-05-21 NOTE — Telephone Encounter (Signed)
Per POF I have called the daughter to schedule appts. Daughter stated" I'm on vacation, I will call back on Monday to schedule."

## 2016-05-28 ENCOUNTER — Telehealth: Payer: Self-pay | Admitting: *Deleted

## 2016-05-28 NOTE — Telephone Encounter (Signed)
I have called the daughter back and scheduled appt for next week

## 2016-06-01 ENCOUNTER — Encounter: Payer: Self-pay | Admitting: Nurse Practitioner

## 2016-06-01 DIAGNOSIS — G629 Polyneuropathy, unspecified: Secondary | ICD-10-CM

## 2016-06-03 ENCOUNTER — Ambulatory Visit (HOSPITAL_BASED_OUTPATIENT_CLINIC_OR_DEPARTMENT_OTHER): Payer: Commercial Managed Care - HMO

## 2016-07-30 ENCOUNTER — Other Ambulatory Visit (HOSPITAL_BASED_OUTPATIENT_CLINIC_OR_DEPARTMENT_OTHER): Payer: Commercial Managed Care - HMO

## 2016-07-30 ENCOUNTER — Encounter: Payer: Self-pay | Admitting: Nurse Practitioner

## 2016-07-30 DIAGNOSIS — E785 Hyperlipidemia, unspecified: Secondary | ICD-10-CM | POA: Diagnosis not present

## 2016-07-30 LAB — CBC WITH DIFFERENTIAL/PLATELET
BASO%: 0.2 % (ref 0.0–2.0)
Basophils Absolute: 0 10*3/uL (ref 0.0–0.1)
EOS%: 1.6 % (ref 0.0–7.0)
Eosinophils Absolute: 0.1 10*3/uL (ref 0.0–0.5)
HCT: 38.8 % (ref 34.8–46.6)
HGB: 13.2 g/dL (ref 11.6–15.9)
LYMPH%: 34.9 % (ref 14.0–49.7)
MCH: 33.3 pg (ref 25.1–34.0)
MCHC: 34 g/dL (ref 31.5–36.0)
MCV: 98 fL (ref 79.5–101.0)
MONO#: 0.6 10*3/uL (ref 0.1–0.9)
MONO%: 7.7 % (ref 0.0–14.0)
NEUT#: 4.5 10*3/uL (ref 1.5–6.5)
NEUT%: 55.6 % (ref 38.4–76.8)
PLATELETS: 214 10*3/uL (ref 145–400)
RBC: 3.96 10*6/uL (ref 3.70–5.45)
RDW: 12 % (ref 11.2–14.5)
WBC: 8.1 10*3/uL (ref 3.9–10.3)
lymph#: 2.8 10*3/uL (ref 0.9–3.3)

## 2016-07-30 LAB — FERRITIN: FERRITIN: 80 ng/mL (ref 9–269)

## 2016-07-30 LAB — LIPID PANEL
CHOLESTEROL: 163 mg/dL (ref 0–200)
HDL: 35 mg/dL (ref 35–70)
LDL Cholesterol: 88 mg/dL
Triglycerides: 199 mg/dL — AB (ref 40–160)

## 2016-07-30 LAB — HEPATIC FUNCTION PANEL
ALK PHOS: 81 U/L (ref 25–125)
ALT: 16 U/L (ref 7–35)
AST: 19 U/L (ref 13–35)
Bilirubin, Total: 0.5 mg/dL

## 2016-07-30 LAB — BASIC METABOLIC PANEL
BUN: 23 mg/dL — AB (ref 4–21)
CREATININE: 0.7 mg/dL (ref 0.5–1.1)
GLUCOSE: 92 mg/dL
POTASSIUM: 4.6 mmol/L (ref 3.4–5.3)
Sodium: 141 mmol/L (ref 137–147)

## 2016-07-30 LAB — IRON AND TIBC
%SAT: 95 % — AB (ref 21–57)
IRON: 215 ug/dL — AB (ref 41–142)
TIBC: 226 ug/dL — ABNORMAL LOW (ref 236–444)
UIBC: 11 ug/dL — AB (ref 120–384)

## 2016-07-31 ENCOUNTER — Other Ambulatory Visit: Payer: Self-pay

## 2016-08-03 ENCOUNTER — Encounter: Payer: Self-pay | Admitting: Hematology

## 2016-08-04 ENCOUNTER — Encounter: Payer: Self-pay | Admitting: Hematology

## 2016-08-06 ENCOUNTER — Ambulatory Visit (HOSPITAL_BASED_OUTPATIENT_CLINIC_OR_DEPARTMENT_OTHER): Payer: Commercial Managed Care - HMO

## 2016-08-06 ENCOUNTER — Ambulatory Visit (HOSPITAL_BASED_OUTPATIENT_CLINIC_OR_DEPARTMENT_OTHER): Payer: Commercial Managed Care - HMO | Admitting: Hematology

## 2016-08-06 ENCOUNTER — Encounter: Payer: Self-pay | Admitting: Hematology

## 2016-08-06 MED ORDER — SODIUM CHLORIDE 0.9 % IV SOLN
Freq: Once | INTRAVENOUS | Status: AC
  Administered 2016-08-06: 16:00:00 via INTRAVENOUS

## 2016-08-06 NOTE — Progress Notes (Signed)
1445: Per Jan RN per Dr. Burr Medico, pt to have a snack piror to phlebotomy. If patient is tolerating phlebotomy well at 375 grams off continue to taking off a total of 500 grams followed by 500cc of NS over one hour.

## 2016-08-06 NOTE — Progress Notes (Signed)
[facility] OFFICE PROGRESS NOTE  Denise Hubert, MD [phone] [place] [address]  DIAGNOSIS: Hemochromatosis, unspecified hemochromatosis type  No chief complaint on file.   CURRENT TREATMENT: Prn phlebotomy to keep ferritin <=100, and transferrin saturation less than 75%  INTERVAL HISTORY: Denise Lane 82 y.o. female with a history of hemochromatosis is here for follow up. She was last ween by me 6 months ago. She had a phlebotomy about 4 months ago, tolerate well, and actually felt better after the procedure. She is doing well overall. She lives with her husband, is her husband's caregiver. She is independent, still drives. She denies any pain, or other discomfort. No ED visit or hospitalizations last 6 months.   MEDICAL HISTORY: Past Medical History:  Diagnosis Date  . Basal cell cancer    "face, legs" (10/18/2012)  . Bruises easily   . Chronic lower back pain   . Depression    takes Effexor daily  . Diverticulosis of sigmoid colon 11-20-2009  . Dry eyes    uses Refresh eye drops daily as needed  . Dysphagia   . Gastric ulcer   . GERD (gastroesophageal reflux disease)    takes Omeprazole daily  . H/O hiatal hernia   . Hemochromatosis 1987  . Hemochromatosis   . Hemorrhoids, internal 11-20-09  . History of blood transfusion    "S/P colonoscopy after polyp removed; got 2 units; esophageal bleed got 1 unit; really messed up hemochromatosis" (1/7/20214)  . History of bronchitis 1991-1996   "chronic; related to winter" (10/18/2012)  . History of colonic polyps    NOTED 11-20-09 IN COLONOSCOPY REPORT  . Hyperlipidemia    no meds required  . Hypertension    takes Metoprolol and Losartan daily  . Joint pain   . Joint swelling   . Muscle pain   . Muscle spasm    takes Robaxin daily as needed  . Occasional tremors   . Osteoarthritis   . Osteoarthritis of right hip 10/18/2012  . Osteoporosis   . Overactive bladder    takes Myrbetriq daily  . PONV  (postoperative nausea and vomiting)   . Rheumatic fever    hx of  . Squamous carcinoma    "LLE" (10/18/2012)  . Stenosis of esophagus   . Tremor, essential 10-29-09  . Urinary urgency    takes Ditropan daily  . Vomiting    occasionally    INTERIM HISTORY: has Hemochromatosis; Osteoarthritis of right hip; Muscle pain; Hyperlipidemia; Urinary incontinence; Back pain; Osteoarthritis; Neuropathy ([place]); Anxiety and depression; Osteoarthritis of left hip; Right thigh pain; Periprosthetic hip fracture; Hip fracture ([place]); Postoperative anemia due to acute blood loss; Leg cramps; and Morton's neuroma of left foot on her problem list.    ALLERGIES:  is allergic to codeine; dilaudid [hydromorphone hcl]; macrodantin; morphine and related; and sulfa antibiotics.  MEDICATIONS: has a current medication list which includes the following prescription(s): acetaminophen, calcium citrate, carboxymethylcellulose, cholecalciferol, gabapentin, losartan, metoprolol tartrate, oxybutynin, ranitidine, and venlafaxine xr.  SURGICAL HISTORY:  Past Surgical History:  Procedure Laterality Date  . APPENDECTOMY  1952  . CATARACT EXTRACTION W/ INTRAOCULAR LENS  IMPLANT, BILATERAL  2001   "both eyes" (10/18/2012)  . CHOLECYSTECTOMY  1977  . COLONOSCOPY    . DILATION AND CURETTAGE OF UTERUS  1975  . ESOPHAGOGASTRODUODENOSCOPY    . [place]  . SHOULDER ARTHROSCOPY W/ ROTATOR CUFF REPAIR  1998?   "left" (10/18/2012)  . SKIN CANCER EXCISION     "  multiple" (10/18/2012)  . TONSILLECTOMY  1953  . TOTAL HIP ARTHROPLASTY  10/18/2012   "right" (10/18/2012)  . TOTAL HIP ARTHROPLASTY  10/18/2012   Procedure: TOTAL HIP ARTHROPLASTY;  Surgeon: Johnny Bridge, MD;  Location: [facility];  Service: Orthopedics;  Laterality: Right;  . TOTAL HIP ARTHROPLASTY Left 07/24/2014   dr Mardelle Matte  . TOTAL HIP ARTHROPLASTY Left 07/24/2014   Procedure: LEFT TOTAL HIP ARTHROPLASTY;  Surgeon: Johnny Bridge, MD;  Location: [facility];  Service:  Orthopedics;  Laterality: Left;  . TUBAL LIGATION  1975    REVIEW OF SYSTEMS:   Constitutional: Denies fevers, chills or abnormal weight loss Eyes: Denies blurriness of vision Ears, nose, mouth, throat, and face: Denies mucositis or sore throat Respiratory: Denies cough, dyspnea or wheezes Cardiovascular: Denies palpitation, chest discomfort or lower extremity swelling Gastrointestinal:  Denies nausea, heartburn or change in bowel habits Skin: Denies abnormal skin rashes Lymphatics: Denies new lymphadenopathy or easy bruising Neurological:Denies numbness, tingling or new weaknesses Behavioral/Psych: Mood is stable, no new changes  All other systems were reviewed with the patient and are negative.  PHYSICAL EXAMINATION: ECOG PERFORMANCE STATUS: 1 - Symptomatic but completely ambulatory  Blood pressure (!) 153/55, pulse 66, temperature 98.4 F (36.9 C), temperature source Oral, resp. rate 17, height 5\' 6"  (1.676 m), weight 120 lb 11.2 oz (54.7 kg), SpO2 98 %.  GENERAL:alert, no distress and comfortable; elderly, thin and ambulates with a cane SKIN: skin color, texture, turgor are normal, no rashes or significant lesions EYES: normal, Conjunctiva are pink and non-injected, sclera clear OROPHARYNX:no exudate, no erythema and lips, buccal mucosa, and tongue normal  NECK: supple, thyroid normal size, non-tender, without nodularity LYMPH:  no palpable lymphadenopathy in the cervical, axillary or supraclavicular LUNGS: clear to auscultation with normal breathing effort, no wheezes or rhonchi HEART: regular rate & rhythm and no murmurs and no lower extremity edema ABDOMEN:abdomen soft, non-tender and normal bowel sounds Musculoskeletal:no cyanosis of digits and no clubbing;Stable arthritic changes to fingers bilaterally NEURO: alert & oriented x 3 with fluent speech, no focal motor/sensory deficits  Labs:  CBC Latest Ref Rng & Units 07/30/2016 05/06/2016 02/05/2016  WBC 3.9 - 10.3 10e3/uL  8.1 7.4 7.2  Hemoglobin 11.6 - 15.9 g/dL 13.2 12.8 13.0  Hematocrit 34.8 - 46.6 % 38.8 37.6 38.4  Platelets 145 - 400 10e3/uL 214 217 216    CMP Latest Ref Rng & Units 07/30/2016 02/05/2016 07/16/2015  Glucose 65 - 99 mg/dL - - 112(H)  BUN 4 - 21 mg/dL 23(A) 27(A) 24  Creatinine 0.5 - 1.1 mg/dL 0.7 0.6 0.66  Sodium 137 - 147 mmol/L 141 140 141  Potassium 3.4 - 5.3 mmol/L 4.6 4.4 4.5  Chloride 97 - 108 mmol/L - - 97  CO2 18 - 29 mmol/L - - 27  Calcium 8.7 - 10.3 mg/dL - - 9.8  Total Protein 6.0 - 8.5 g/dL - - 6.5  Total Bilirubin 0.0 - 1.2 mg/dL - - 0.3  Alkaline Phos 25 - 125 U/L 81 - 104  AST 13 - 35 U/L 19 - 16  ALT 7 - 35 U/L 16 - 12   Results for DEISY, FRICK (MRN [id number]) as of 08/06/2016 08:17  Ref. Range 02/05/2016 12:51 05/06/2016 12:53 07/30/2016 13:04  Iron Latest Ref Range: 41 - 142 ug/dL 189 (H) 188 (H) 215 (H)  UIBC Latest Ref Range: 120 - 384 ug/dL 45 (L) 39 (L) 11 (L)  TIBC Latest Ref Range: 236 - 444 ug/dL 234 (L)  228 (L) 226 (L)  %SAT Latest Ref Range: 21 - 57 % 81 (H) 83 (H) 95 (H)  Ferritin Latest Ref Range: 9 - 269 ng/ml 85 83 80     RADIOGRAPHIC STUDIES: No results found.  ASSESSMENT: Anely Charleston Krapf 82 y.o. female with a history of Hemochromatosis, unspecified hemochromatosis type   PLAN:  1.Hemachromatosis:  -She is clinically doing very well. -given her advanced age and symptomatic phlebotomy (presyncope ), I will only do phlebotomize if her ferritin >100 or very high transferrin saturation.   -I recommend her to repeat lab every 3 months. If her ferritin >100 or transferrin saturation>75%, i will recommend phlebotomy, I'll do 0.75-1 unit with normal saline 500 mL infusion. -We'll do phlebotomy today based on her lab results -Her liver ultrasound on 08/19/2015 shows no liver or spleen abnormality. We'll repeat once a year    Follow-up:  1. Return to clinic for lab in 3 months and phlebotomy one week after her next blood draw She will call  us in paternal lab and phlebotomy appointment to see if she needs coming in. 2. Abdominal ultrasound in the next month 3. I will see her in 6 months   All questions were answered. The patient knows to call the clinic with any problems, questions or concerns. We can certainly see the patient much sooner if necessary.  I spent 10 minutes counseling the patient face to face. The total time spent in the appointment was 15 minutes.    Truitt Merle, MD 08/06/16 10:39 PM

## 2016-08-06 NOTE — Progress Notes (Signed)
Patient provided snack and juice prior to phlebotomy. Phlebotomy started at 1523, 18 gauge needle used to the left AC. Patient reassessed after 375 grams removed; patient and vital signs stable at this time. Phlebotomy ended at 1540 with a total of 560 grams removed. Patient tolerated well and vital signs stable. Snack offered and juice given.

## 2016-08-06 NOTE — Patient Instructions (Addendum)
Therapeutic Phlebotomy Therapeutic phlebotomy is the controlled removal of blood from a person's body for the purpose of treating a medical condition. The procedure is similar to donating blood. Usually, about a pint (470 mL, or 0.47L) of blood is removed. The average adult has 9-12 pints (4.3-5.7 L) of blood. Therapeutic phlebotomy may be used to treat the following medical conditions:  Hemochromatosis. This is a condition in which the blood contains too much iron.  Polycythemia vera. This is a condition in which the blood contains too many red blood cells.  Porphyria cutanea tarda. This is a disease in which an important part of hemoglobin is not made properly. It results in the buildup of abnormal amounts of porphyrins in the body.  Sickle cell disease. This is a condition in which the red blood cells form an abnormal crescent shape rather than a round shape. LET [facility] CARE PROVIDER KNOW ABOUT:  Any allergies you have.  All medicines you are taking, including vitamins, herbs, eye drops, creams, and over-the-counter medicines.  Previous problems you or members of your family have had with the use of anesthetics.  Any blood disorders you have.  Previous surgeries you have had.  Any medical conditions you may have. RISKS AND COMPLICATIONS Generally, this is a safe procedure. However, problems may occur, including:  Nausea or light-headedness.  Low blood pressure.  Soreness, bleeding, swelling, or bruising at the needle insertion site.  Infection. BEFORE THE PROCEDURE  Follow instructions from your health care provider about eating or drinking restrictions.  Ask your health care provider about changing or stopping your regular medicines. This is especially important if you are taking diabetes medicines or blood thinners.  Wear clothing with sleeves that can be raised above the elbow.  Plan to have someone take you home after the procedure.  You may have a blood sample  taken. PROCEDURE  A needle will be inserted into one of your veins.  Tubing and a collection bag will be attached to that needle.  Blood will flow through the needle and tubing into the collection bag.  You may be asked to open and close your hand slowly and continually during the entire collection.  After the specified amount of blood has been removed from your body, the collection bag and tubing will be clamped.  The needle will be removed from your vein.  Pressure will be held on the site of the needle insertion to stop the bleeding.  A bandage (dressing) will be placed over the needle insertion site. The procedure may vary among health care providers and hospitals. AFTER THE PROCEDURE  Your recovery will be assessed and monitored.  You can return to your normal activities as directed by your health care provider.   This information is not intended to replace advice given to you by your health care provider. Make sure you discuss any questions you have with your health care provider.   Document Released: 03/02/2011 Document Revised: 02/12/2015 Document Reviewed: 09/24/2014 Elsevier Interactive Patient Education 2016 Elsevier Inc[employer]    Dehydration, Adult Dehydration is a condition in which you do not have enough fluid or water in your body. It happens when you take in less fluid than you lose. Vital organs such as the kidneys, brain, and heart cannot function without a proper amount of fluids. Any loss of fluids from the body can cause dehydration.  Dehydration can range from mild to severe. This condition should be treated right away to help prevent it from becoming severe.  CAUSES  This condition may be caused by:  Vomiting.  Diarrhea.  Excessive sweating, such as when exercising in hot or humid weather.  Not drinking enough fluid during strenuous exercise or during an illness.  Excessive urine output.  Fever.  Certain medicines. RISK FACTORS This condition is  more likely to develop in:  People who are taking certain medicines that cause the body to lose excess fluid (diuretics).   People who have a chronic illness, such as diabetes, that may increase urination.  Older adults.   People who live at high altitudes.   People who participate in endurance sports.  SYMPTOMS  Mild Dehydration  Thirst.  Dry lips.  Slightly dry mouth.  Dry, warm skin. Moderate Dehydration  Very dry mouth.   Muscle cramps.   Dark urine and decreased urine production.   Decreased tear production.   Headache.   Light-headedness, especially when you stand up from a sitting position.  Severe Dehydration  Changes in skin.   Cold and clammy skin.   Skin does not spring back quickly when lightly pinched and released.   Changes in body fluids.   Extreme thirst.   No tears.   Not able to sweat when body temperature is high, such as in hot weather.   Minimal urine production.   Changes in vital signs.   Rapid, weak pulse (more than 100 beats per minute when you are sitting still).   Rapid breathing.   Low blood pressure.   Other changes.   Sunken eyes.   Cold hands and feet.   Confusion.  Lethargy and difficulty being awakened.  Fainting (syncope).   Short-term weight loss.   Unconsciousness. DIAGNOSIS  This condition may be diagnosed based on your symptoms. You may also have tests to determine how severe your dehydration is. These tests may include:   Urine tests.   Blood tests.  TREATMENT  Treatment for this condition depends on the severity. Mild or moderate dehydration can often be treated at home. Treatment should be started right away. Do not wait until dehydration becomes severe. Severe dehydration needs to be treated at the hospital. Treatment for Mild Dehydration  Drinking plenty of water to replace the fluid you have lost.   Replacing minerals in your blood (electrolytes) that you  may have lost.  Treatment for Moderate Dehydration  Consuming oral rehydration solution (ORS). Treatment for Severe Dehydration  Receiving fluid through an IV tube.   Receiving electrolyte solution through a feeding tube that is passed through your nose and into your stomach (nasogastric tube or NG tube).  Correcting any abnormalities in electrolytes. HOME CARE INSTRUCTIONS   Drink enough fluid to keep your urine clear or pale yellow.   Drink water or fluid slowly by taking small sips. You can also try sucking on ice cubes.  Have food or beverages that contain electrolytes. Examples include bananas and sports drinks.  Take over-the-counter and prescription medicines only as told by your health care provider.   Prepare ORS according to the manufacturer's instructions. Take sips of ORS every 5 minutes until your urine returns to normal.  If you have vomiting or diarrhea, continue to try to drink water, ORS, or both.   If you have diarrhea, avoid:   Beverages that contain caffeine.   Fruit juice.   Milk.   Carbonated soft drinks.  Do not take salt tablets. This can lead to the condition of having too much sodium in your body (hypernatremia).  SEEK MEDICAL CARE IF:  You cannot eat or drink without vomiting.  You have had moderate diarrhea during a period of more than 24 hours.  You have a fever. SEEK IMMEDIATE MEDICAL CARE IF:   You have extreme thirst.  You have severe diarrhea.  You have not urinated in 6-8 hours, or you have urinated only a small amount of very dark urine.  You have shriveled skin.  You are dizzy, confused, or both.   This information is not intended to replace advice given to you by your health care provider. Make sure you discuss any questions you have with your health care provider.   Document Released: 09/28/2005 Document Revised: 06/19/2015 Document Reviewed: 02/13/2015 Elsevier Interactive Patient Education [employer].

## 2016-08-19 ENCOUNTER — Telehealth: Payer: Self-pay | Admitting: General Practice

## 2016-08-19 NOTE — Telephone Encounter (Signed)
Spoke with pt confirmed January 2018 appt.

## 2016-09-08 ENCOUNTER — Telehealth: Payer: Self-pay | Admitting: Internal Medicine

## 2016-09-08 NOTE — Telephone Encounter (Signed)
Left msg asking pt to confirm this AWV appt that's rescheduled to be before CPE, not after. VDM (DD)

## 2016-10-19 ENCOUNTER — Ambulatory Visit (HOSPITAL_COMMUNITY)
Admission: RE | Admit: 2016-10-19 | Discharge: 2016-10-19 | Disposition: A | Discharge status: Home / Self Care | Payer: Medicare HMO | Source: Ambulatory Visit | Attending: Hematology | Admitting: Hematology

## 2016-10-20 ENCOUNTER — Telehealth: Payer: Self-pay | Admitting: *Deleted

## 2016-10-20 NOTE — Telephone Encounter (Signed)
-----   Message from Truitt Merle, MD sent at 10/19/2016  7:25 PM EST ----- Please let pt know that her [place] was fine. Thanks  Truitt Merle  10/19/2016

## 2016-10-20 NOTE — Telephone Encounter (Signed)
Called pt at home and spoke with daughter Jeannene Patella.  Informed [name] that pt's  Abdominal US fine as per md.  [name] voiced understanding and stated she would relay message to pt.

## 2016-10-28 ENCOUNTER — Other Ambulatory Visit: Payer: Self-pay | Admitting: Internal Medicine

## 2016-10-28 DIAGNOSIS — I10 Essential (primary) hypertension: Secondary | ICD-10-CM

## 2016-10-29 ENCOUNTER — Telehealth: Payer: Self-pay | Admitting: Hematology

## 2016-10-29 NOTE — Telephone Encounter (Signed)
Patient called to cancel her appointment tomorrow 1/19 due to inclement weather she need to reschedule before her appointment with Dr Burr Medico on Friday 1/26

## 2016-10-30 ENCOUNTER — Other Ambulatory Visit: Payer: Commercial Managed Care - HMO

## 2016-11-02 ENCOUNTER — Other Ambulatory Visit (HOSPITAL_BASED_OUTPATIENT_CLINIC_OR_DEPARTMENT_OTHER): Payer: Medicare HMO

## 2016-11-02 LAB — IRON AND TIBC
%SAT: 77 % — ABNORMAL HIGH (ref 21–57)
Iron: 172 ug/dL — ABNORMAL HIGH (ref 41–142)
TIBC: 223 ug/dL — ABNORMAL LOW (ref 236–444)
UIBC: 51 ug/dL — AB (ref 120–384)

## 2016-11-02 LAB — CBC WITH DIFFERENTIAL/PLATELET
BASO%: 0.3 % (ref 0.0–2.0)
Basophils Absolute: 0 10*3/uL (ref 0.0–0.1)
EOS%: 1.5 % (ref 0.0–7.0)
Eosinophils Absolute: 0.1 10*3/uL (ref 0.0–0.5)
HCT: 36.8 % (ref 34.8–46.6)
HGB: 12.3 g/dL (ref 11.6–15.9)
LYMPH%: 28.4 % (ref 14.0–49.7)
MCH: 33.2 pg (ref 25.1–34.0)
MCHC: 33.5 g/dL (ref 31.5–36.0)
MCV: 99 fL (ref 79.5–101.0)
MONO#: 0.7 10*3/uL (ref 0.1–0.9)
MONO%: 10 % (ref 0.0–14.0)
NEUT#: 4.1 10*3/uL (ref 1.5–6.5)
NEUT%: 59.8 % (ref 38.4–76.8)
Platelets: 237 10*3/uL (ref 145–400)
RBC: 3.72 10*6/uL (ref 3.70–5.45)
RDW: 12.2 % (ref 11.2–14.5)
WBC: 6.9 10*3/uL (ref 3.9–10.3)
lymph#: 2 10*3/uL (ref 0.9–3.3)

## 2016-11-02 LAB — FERRITIN: FERRITIN: 96 ng/mL (ref 9–269)

## 2016-11-04 ENCOUNTER — Telehealth: Payer: Self-pay | Admitting: *Deleted

## 2016-11-04 NOTE — Telephone Encounter (Signed)
-----   Message from Truitt Merle, MD sent at 11/03/2016 10:36 PM EST ----- Please call pt and let her know the iron study result, and she will need phlebotomy which is scheduled for 1/26.  Truitt Merle  11/03/2016

## 2016-11-04 NOTE — Telephone Encounter (Signed)
Called pt & informed of lab results per Dr Burr Medico & need for phlebotomy 11/06/16.  She expressed understanding.

## 2016-11-06 ENCOUNTER — Ambulatory Visit (HOSPITAL_BASED_OUTPATIENT_CLINIC_OR_DEPARTMENT_OTHER): Payer: Medicare HMO

## 2016-11-06 MED ORDER — SODIUM CHLORIDE 0.9 % IV SOLN
Freq: Once | INTRAVENOUS | Status: AC
  Administered 2016-11-06: 15:00:00 via INTRAVENOUS

## 2016-11-06 NOTE — Patient Instructions (Signed)
Therapeutic Phlebotomy Therapeutic phlebotomy is the controlled removal of blood from a person's body for the purpose of treating a medical condition. The procedure is similar to donating blood. Usually, about a pint (470 mL, or 0.47L) of blood is removed. The average adult has 9-12 pints (4.3-5.7 L) of blood. Therapeutic phlebotomy may be used to treat the following medical conditions:  Hemochromatosis. This is a condition in which the blood contains too much iron.  Polycythemia vera. This is a condition in which the blood contains too many red blood cells.  Porphyria cutanea tarda. This is a disease in which an important part of hemoglobin is not made properly. It results in the buildup of abnormal amounts of porphyrins in the body.  Sickle cell disease. This is a condition in which the red blood cells form an abnormal crescent shape rather than a round shape. LET [facility] CARE PROVIDER KNOW ABOUT:  Any allergies you have.  All medicines you are taking, including vitamins, herbs, eye drops, creams, and over-the-counter medicines.  Previous problems you or members of your family have had with the use of anesthetics.  Any blood disorders you have.  Previous surgeries you have had.  Any medical conditions you may have. RISKS AND COMPLICATIONS Generally, this is a safe procedure. However, problems may occur, including:  Nausea or light-headedness.  Low blood pressure.  Soreness, bleeding, swelling, or bruising at the needle insertion site.  Infection. BEFORE THE PROCEDURE  Follow instructions from your health care provider about eating or drinking restrictions.  Ask your health care provider about changing or stopping your regular medicines. This is especially important if you are taking diabetes medicines or blood thinners.  Wear clothing with sleeves that can be raised above the elbow.  Plan to have someone take you home after the procedure.  You may have a blood sample  taken. PROCEDURE  A needle will be inserted into one of your veins.  Tubing and a collection bag will be attached to that needle.  Blood will flow through the needle and tubing into the collection bag.  You may be asked to open and close your hand slowly and continually during the entire collection.  After the specified amount of blood has been removed from your body, the collection bag and tubing will be clamped.  The needle will be removed from your vein.  Pressure will be held on the site of the needle insertion to stop the bleeding.  A bandage (dressing) will be placed over the needle insertion site. The procedure may vary among health care providers and hospitals. AFTER THE PROCEDURE  Your recovery will be assessed and monitored.  You can return to your normal activities as directed by your health care provider.   This information is not intended to replace advice given to you by your health care provider. Make sure you discuss any questions you have with your health care provider.   Document Released: 03/02/2011 Document Revised: 02/12/2015 Document Reviewed: 09/24/2014 Elsevier Interactive Patient Education 2016 Elsevier Inc.    Dehydration, Adult Dehydration is a condition in which you do not have enough fluid or water in your body. It happens when you take in less fluid than you lose. Vital organs such as the kidneys, brain, and heart cannot function without a proper amount of fluids. Any loss of fluids from the body can cause dehydration.  Dehydration can range from mild to severe. This condition should be treated right away to help prevent it from becoming severe.  CAUSES  This condition may be caused by:  Vomiting.  Diarrhea.  Excessive sweating, such as when exercising in hot or humid weather.  Not drinking enough fluid during strenuous exercise or during an illness.  Excessive urine output.  Fever.  Certain medicines. RISK FACTORS This condition is  more likely to develop in:  People who are taking certain medicines that cause the body to lose excess fluid (diuretics).   People who have a chronic illness, such as diabetes, that may increase urination.  Older adults.   People who live at high altitudes.   People who participate in endurance sports.  SYMPTOMS  Mild Dehydration  Thirst.  Dry lips.  Slightly dry mouth.  Dry, warm skin. Moderate Dehydration  Very dry mouth.   Muscle cramps.   Dark urine and decreased urine production.   Decreased tear production.   Headache.   Light-headedness, especially when you stand up from a sitting position.  Severe Dehydration  Changes in skin.   Cold and clammy skin.   Skin does not spring back quickly when lightly pinched and released.   Changes in body fluids.   Extreme thirst.   No tears.   Not able to sweat when body temperature is high, such as in hot weather.   Minimal urine production.   Changes in vital signs.   Rapid, weak pulse (more than 100 beats per minute when you are sitting still).   Rapid breathing.   Low blood pressure.   Other changes.   Sunken eyes.   Cold hands and feet.   Confusion.  Lethargy and difficulty being awakened.  Fainting (syncope).   Short-term weight loss.   Unconsciousness. DIAGNOSIS  This condition may be diagnosed based on your symptoms. You may also have tests to determine how severe your dehydration is. These tests may include:   Urine tests.   Blood tests.  TREATMENT  Treatment for this condition depends on the severity. Mild or moderate dehydration can often be treated at home. Treatment should be started right away. Do not wait until dehydration becomes severe. Severe dehydration needs to be treated at the hospital. Treatment for Mild Dehydration  Drinking plenty of water to replace the fluid you have lost.   Replacing minerals in your blood (electrolytes) that you  may have lost.  Treatment for Moderate Dehydration  Consuming oral rehydration solution (ORS). Treatment for Severe Dehydration  Receiving fluid through an IV tube.   Receiving electrolyte solution through a feeding tube that is passed through your nose and into your stomach (nasogastric tube or NG tube).  Correcting any abnormalities in electrolytes. HOME CARE INSTRUCTIONS   Drink enough fluid to keep your urine clear or pale yellow.   Drink water or fluid slowly by taking small sips. You can also try sucking on ice cubes.  Have food or beverages that contain electrolytes. Examples include bananas and sports drinks.  Take over-the-counter and prescription medicines only as told by your health care provider.   Prepare ORS according to the manufacturer's instructions. Take sips of ORS every 5 minutes until your urine returns to normal.  If you have vomiting or diarrhea, continue to try to drink water, ORS, or both.   If you have diarrhea, avoid:   Beverages that contain caffeine.   Fruit juice.   Milk.   Carbonated soft drinks.  Do not take salt tablets. This can lead to the condition of having too much sodium in your body (hypernatremia).  SEEK MEDICAL CARE IF:  You cannot eat or drink without vomiting.  You have had moderate diarrhea during a period of more than 24 hours.  You have a fever. SEEK IMMEDIATE MEDICAL CARE IF:   You have extreme thirst.  You have severe diarrhea.  You have not urinated in 6-8 hours, or you have urinated only a small amount of very dark urine.  You have shriveled skin.  You are dizzy, confused, or both.   This information is not intended to replace advice given to you by your health care provider. Make sure you discuss any questions you have with your health care provider.   Document Released: 09/28/2005 Document Revised: 06/19/2015 Document Reviewed: 02/13/2015 Elsevier Interactive Patient Education [employer].

## 2016-11-19 ENCOUNTER — Ambulatory Visit (INDEPENDENT_AMBULATORY_CARE_PROVIDER_SITE_OTHER): Payer: Medicare HMO

## 2016-11-19 ENCOUNTER — Ambulatory Visit: Payer: Commercial Managed Care - HMO

## 2016-11-19 ENCOUNTER — Encounter: Payer: Self-pay | Admitting: Nurse Practitioner

## 2016-11-19 ENCOUNTER — Ambulatory Visit (INDEPENDENT_AMBULATORY_CARE_PROVIDER_SITE_OTHER): Payer: Medicare HMO | Admitting: Nurse Practitioner

## 2016-11-19 VITALS — BP 130/68 | HR 71 | Temp 98.4°F | Ht 66.0 in | Wt 120.2 lb

## 2016-11-19 VITALS — BP 130/58 | HR 71 | Temp 98.4°F | Resp 18 | Ht 66.0 in | Wt 120.2 lb

## 2016-11-19 DIAGNOSIS — N393 Stress incontinence (female) (male): Secondary | ICD-10-CM | POA: Diagnosis not present

## 2016-11-19 DIAGNOSIS — F418 Other specified anxiety disorders: Secondary | ICD-10-CM | POA: Diagnosis not present

## 2016-11-19 DIAGNOSIS — M1990 Unspecified osteoarthritis, unspecified site: Secondary | ICD-10-CM

## 2016-11-19 DIAGNOSIS — F329 Major depressive disorder, single episode, unspecified: Secondary | ICD-10-CM

## 2016-11-19 DIAGNOSIS — Z0000 Encounter for general adult medical examination without abnormal findings: Secondary | ICD-10-CM

## 2016-11-19 DIAGNOSIS — F419 Anxiety disorder, unspecified: Secondary | ICD-10-CM

## 2016-11-19 DIAGNOSIS — G629 Polyneuropathy, unspecified: Secondary | ICD-10-CM | POA: Diagnosis not present

## 2016-11-19 NOTE — Progress Notes (Signed)
Provider: [name], MD  Patient Care Team: Estill Dooms, MD as PCP - General (Internal Medicine)  Extended Emergency Contact Information Primary Emergency Contact: Kemp,Pamela Address: [address] [place], [place] [address] [place] Phone: [phone] Relation: Daughter Allergies  Allergen Reactions  . Codeine Nausea And Vomiting  . Dilaudid [Hydromorphone Hcl] Hives and Other (See Comments)    "whelps" (10/18/2012)  . Macrodantin Hives and Other (See Comments)    "drug fever; chills; felt terrible" (10/18/2012)  . Morphine And Related Hives and Nausea And Vomiting  . Sulfa Antibiotics Hives, Rash and Other (See Comments)    "got real sick" (10/18/2012)  . Ibuprofen Other (See Comments)    "felt like I have to go to the bathroom constantly"   Code Status: Full-Code Goals of Care: Advanced Directive information Advanced Directives 11/19/2016  Does Patient Have a Medical Advance Directive? No;Yes  Type of Advance Directive [place]  Does patient want to make changes to medical advance directive? -  Copy of [place] in Chart? No - copy requested  Would patient like information on creating a medical advance directive? No - Patient declined  Pre-existing out of facility DNR order (yellow form or pink MOST form) -     Chief Complaint  Patient presents with  . Medical Management of Chronic Issues    Complete physical. Passed clock drawing.     HPI: Patient is a 83 y.o. female seen in today for an annual wellness exam.   Major illnesses or hospitalization in the last year-None  Depression screen [facility] 2/9 11/19/2016 11/21/2015 07/16/2015 05/25/2013 01/12/2013  Decreased Interest 0 0 0 0 0  Down, Depressed, Hopeless 0 0 0 0 0  PHQ - 2 Score 0 0 0 0 0    Fall Risk  11/19/2016 05/14/2016 11/21/2015 07/16/2015 01/14/2015  Falls in the past year? No No No No No  Risk for fall due to : History of fall(s) - - - -     MMSE - Mini Mental State Exam 11/19/2016  Orientation to time 5  Orientation to Place 5  Registration 3  Attention/ Calculation 5  Recall 3  Language- name 2 objects 2  Language- repeat 1  Language- follow 3 step command 3  Language- read & follow direction 1  Write a sentence 1  Copy design 1  Total score 30     Health Maintenance  Topic Date Due  . MAMMOGRAM  01/16/2017  . TETANUS/TDAP  05/28/2023  . INFLUENZA VACCINE  Completed  . DEXA SCAN  Completed  . ZOSTAVAX  Completed  . PNA vac Low Risk Adult  Completed    Past Medical History:  Diagnosis Date  . Basal cell cancer    "face, legs" (10/18/2012)  . Bruises easily   . Chronic lower back pain   . Depression    takes Effexor daily  . Diverticulosis of sigmoid colon 11-20-2009  . Dry eyes    uses Refresh eye drops daily as needed  . Dysphagia   . Gastric ulcer   . GERD (gastroesophageal reflux disease)    takes Omeprazole daily  . H/O hiatal hernia   . Hemochromatosis 1987  . Hemochromatosis   . Hemorrhoids, internal 11-20-09  . History of blood transfusion    "S/P colonoscopy after polyp removed; got 2 units; esophageal bleed got 1 unit; really messed up hemochromatosis" (  1/7/20214)  . History of bronchitis 1991-1996   "chronic; related to winter" (10/18/2012)  . History of colonic polyps    NOTED 11-20-09 IN COLONOSCOPY REPORT  . Hyperlipidemia    no meds required  . Hypertension    takes Metoprolol and Losartan daily  . Joint pain   . Joint swelling   . Muscle pain   . Muscle spasm    takes Robaxin daily as needed  . Occasional tremors   . Osteoarthritis   . Osteoarthritis of right hip 10/18/2012  . Osteoporosis   . Overactive bladder    takes Myrbetriq daily  . PONV (postoperative nausea and vomiting)   . Rheumatic fever    hx of  . Squamous carcinoma    "LLE" (10/18/2012)  . Stenosis of esophagus   . Tremor, essential 10-29-09  . Urinary urgency    takes Ditropan daily  . Vomiting    occasionally     Past Surgical History:  Procedure Laterality Date  . APPENDECTOMY  1952  . CATARACT EXTRACTION W/ INTRAOCULAR LENS  IMPLANT, BILATERAL  2001   "both eyes" (10/18/2012)  . CHOLECYSTECTOMY  1977  . COLONOSCOPY    . DILATION AND CURETTAGE OF UTERUS  1975  . ESOPHAGOGASTRODUODENOSCOPY    . [place]  . SHOULDER ARTHROSCOPY W/ ROTATOR CUFF REPAIR  1998?   "left" (10/18/2012)  . SKIN CANCER EXCISION     "multiple" (10/18/2012)  . TONSILLECTOMY  1953  . TOTAL HIP ARTHROPLASTY  10/18/2012   "right" (10/18/2012)  . TOTAL HIP ARTHROPLASTY  10/18/2012   Procedure: TOTAL HIP ARTHROPLASTY;  Surgeon: Johnny Bridge, MD;  Location: [facility];  Service: Orthopedics;  Laterality: Right;  . TOTAL HIP ARTHROPLASTY Left 07/24/2014   dr Mardelle Matte  . TOTAL HIP ARTHROPLASTY Left 07/24/2014   Procedure: LEFT TOTAL HIP ARTHROPLASTY;  Surgeon: Johnny Bridge, MD;  Location: [facility];  Service: Orthopedics;  Laterality: Left;  . TUBAL LIGATION  1975    Social History   Social History  . Marital status: Married    Spouse name: N/A  . Number of children: N/A  . Years of education: N/A   Social History Main Topics  . Smoking status: Never Smoker  . Smokeless tobacco: Never Used  . Alcohol use No  . Drug use: No  . Sexual activity: No   Other Topics Concern  . None   Social History Narrative  . None    Family History  Problem Relation Age of Onset  . Heart disease Mother   . Heart disease Father   . Hyperlipidemia Daughter   . Heart disease Brother   . Heart disease Brother   . Heart disease Brother   . Heart disease Brother   . Cancer Sister   . Melanoma Sister   . Alzheimer's disease Sister   . Heart disease Sister   . Osteoporosis Sister      Pt sees dermatology routinely Still getting mammogram yearly  Dentist twice per year Opthalmology yearly  Pt has complaints of increased stressors trying to care for her demented husband and herself. She is responsible for many of his  ADL's/IADL's, as well as total house and yard work. She currently drives short distances. Otherwise, she has been doing well. Denies changes in cognition, functional abilites, falls, or recent illnesses.    Review of Systems:  Review of Systems  Constitutional: Negative for activity change, appetite change, chills, fatigue and fever.  Reports slight decreases in weight, gradual  HENT: Negative for congestion, dental problem, hearing loss, rhinorrhea, sinus pain, sinus pressure, sneezing and sore throat.   Eyes: Negative for photophobia, discharge, itching and visual disturbance.  Respiratory: Negative for cough, chest tightness, shortness of breath and wheezing.   Cardiovascular: Negative for chest pain, palpitations and leg swelling.  Gastrointestinal: Negative for abdominal pain, blood in stool, constipation, diarrhea, nausea and vomiting.  Genitourinary: Positive for frequency and urgency. Negative for difficulty urinating and pelvic pain.  Musculoskeletal: Positive for back pain.       Mild lower back with standing  Skin: Negative.   Neurological: Negative for dizziness, syncope, weakness, numbness and headaches.  Psychiatric/Behavioral: Negative.      Allergies as of 11/19/2016      Reactions   Codeine Nausea And Vomiting   Dilaudid [hydromorphone Hcl] Hives, Other (See Comments)   "whelps" (10/18/2012)   Macrodantin Hives, Other (See Comments)   "drug fever; chills; felt terrible" (10/18/2012)   Morphine And Related Hives, Nausea And Vomiting   Sulfa Antibiotics Hives, Rash, Other (See Comments)   "got real sick" (10/18/2012)   Ibuprofen Other (See Comments)   "felt like I have to go to the bathroom constantly"      Medication List       Accurate as of 11/19/16  2:43 PM. Always use your most recent med list.          acetaminophen 650 MG CR tablet Commonly known as:  TYLENOL Take 650 mg by mouth 2 (two) times daily.   calcium citrate 950 MG tablet Commonly known as:   CALCITRATE - dosed in mg elemental calcium Take 1-2 tablets by mouth 3 (three) times daily. 2 tablets in am and 1 tablet with lunch and supper   carboxymethylcellulose 0.5 % Soln Commonly known as:  REFRESH PLUS Place 1 drop into both eyes 3 (three) times daily as needed. For dry eyes   cholecalciferol 1000 units tablet Commonly known as:  VITAMIN D Take 1,000 Units by mouth 2 (two) times daily.   gabapentin 300 MG capsule Commonly known as:  NEURONTIN Take one capsule by mouth every 12 hours as needed for nerve pain DO NOT FILL UNTIL 2018   losartan 50 MG tablet Commonly known as:  COZAAR TAKE 1 TABLET EVERY DAY   metoprolol tartrate 25 MG tablet Commonly known as:  LOPRESSOR Take 1 tablet (25 mg total) by mouth daily.   oxybutynin 5 MG tablet Commonly known as:  DITROPAN TAKE 1 TABLET TWICE DAILY   ranitidine 150 MG tablet Commonly known as:  ZANTAC Take 150 mg by mouth daily.   venlafaxine XR 37.5 MG 24 hr capsule Commonly known as:  EFFEXOR-XR Take 1 capsule (37.5 mg total) by mouth daily with breakfast.         Physical Exam: Vitals:   11/19/16 1330  BP: (!) 130/58  Pulse: 71  Resp: 18  Temp: 98.4 F (36.9 C)  TempSrc: Oral  SpO2: 98%  Weight: 120 lb 3.2 oz (54.5 kg)  Height: 5\' 6"  (1.676 m)   Body mass index is 19.4 kg/m. Physical Exam  Constitutional: She is oriented to person, place, and time.  Pleasant, frail, NAD  HENT:  Head: Normocephalic and atraumatic.  Right Ear: External ear normal.  Left Ear: External ear normal.  Nose: Nose normal.  Mouth/Throat: Oropharynx is clear and moist.  Eyes: Conjunctivae and EOM are normal. Pupils are equal, round, and reactive to light. Right eye exhibits  no discharge. Left eye exhibits no discharge. No scleral icterus.  Neck: Normal range of motion. Neck supple. No JVD present. No thyromegaly present.  Positive right sided tender nodule- has been stable for years   Cardiovascular: Normal rate, regular  rhythm, normal heart sounds and intact distal pulses.   No murmur heard. Pulmonary/Chest: Effort normal and breath sounds normal. No stridor. No respiratory distress. She has no wheezes. She has no rales.  Abdominal: Soft. Bowel sounds are normal. She exhibits no distension. There is no tenderness.  Genitourinary:  Genitourinary Comments: Symmetric breast tissue, no palpable masses or nodules noted.   Musculoskeletal: Normal range of motion. She exhibits no edema or tenderness.  Lymphadenopathy:    She has no cervical adenopathy.  Neurological: She is alert and oriented to person, place, and time. She has normal reflexes.  CN II-XI grossly intact  Skin: Skin is warm and dry. No rash noted. No erythema.  Scattered senile purpura  Psychiatric: She has a normal mood and affect. Her behavior is normal. Judgment and thought content normal.    Labs reviewed: Basic Metabolic Panel:  Recent Labs  02/05/16 07/30/16  NA 140 141  K 4.4 4.6  BUN 27* 23*  CREATININE 0.6 0.7   Liver Function Tests:  Recent Labs  07/30/16  AST 19  ALT 16  ALKPHOS 81   No results for input(s): LIPASE, AMYLASE in the last 8760 hours. No results for input(s): AMMONIA in the last 8760 hours. CBC:  Recent Labs  05/06/16 1253 07/30/16 1304 11/02/16 1135  WBC 7.4 8.1 6.9  NEUTROABS 3.9 4.5 4.1  HGB 12.8 13.2 12.3  HCT 37.6 38.8 36.8  MCV 98.2 98.0 99.0  PLT 217 214 237   Lipid Panel:  Recent Labs  07/30/16  CHOL 163  HDL 35  LDLCALC 88  TRIG 199*   No results found for: HGBA1C  Procedures: No results found.  Assessment/Plan 1. Neuropathy ([place]) -Stable. Pt taking Neurontin every other day with s/s of tingling or burning in her lower extremities. -May stop medication for now -If s/s increase, let us know if you restart this medication  2. Osteoarthritis, unspecified osteoarthritis type, unspecified site -Stable. Pt remains active. Continue with daily activities as tolerated. -Continue  calcium citrate and Vit D for bone protection -Tylenol for increases in pain. Do not take more than 4g form all sources per day.   3. Stress incontinence of urine -Stable -Decrease fluid intake closer to bedtime -Bladder schedule -Continue ditropan .  4. Anxiety and depression -Stable -Pt mentioned coming off of Effexor, however with further discussion, will remain on this due to increased caretaker demands and stress with demented husband -Denies HI and SI -Will continue to assess with every visit, sooner if necessary  5. Encounter for Medicare annual wellness exam Pt doing well, no new issues today. Pt conts to follow up with dermatologist yearly, seeing dentist regularly.  the patient was counseled regarding the appropriate use of alcohol, regular self-examination of the breasts on a monthly basis, prevention of dental and periodontal disease, diet, regular sustained exercise for at least 30 minutes 5 times per week, routine screening interval for mammogram as recommended by the [place] and ACOG, and recommended schedule for GI hemoccult testing, colonoscopy, cholesterol, thyroid and diabetes screening.   Follow up in 6 months, sooner if needed  Chantia Amalfitano K. Harle Battiest  [facility] [phone] 8 am - 5 pm) [phone] (after hours)

## 2016-11-19 NOTE — Progress Notes (Signed)
Subjective:   Denise Lane is a 83 y.o. female who presents for an Initial Medicare Annual Wellness Visit.  Review of Systems     Cardiac Risk Factors include: advanced age (>19men, >69 women);hypertension;dyslipidemia     Objective:    Today's Vitals   11/19/16 1249  BP: 130/68  Pulse: 71  Temp: 98.4 F (36.9 C)  TempSrc: Oral  SpO2: 98%  Weight: 120 lb 3.2 oz (54.5 kg)  Height: 5\' 6"  (1.676 m)   Body mass index is 19.4 kg/m.   Current Medications (verified) Outpatient Encounter Prescriptions as of 11/19/2016  Medication Sig  . acetaminophen (TYLENOL) 650 MG CR tablet Take 650 mg by mouth 2 (two) times daily.   . calcium citrate (CALCITRATE - DOSED IN MG ELEMENTAL CALCIUM) 950 MG tablet Take 1-2 tablets by mouth 3 (three) times daily. 2 tablets in am and 1 tablet with lunch and supper  . carboxymethylcellulose (REFRESH PLUS) 0.5 % SOLN Place 1 drop into both eyes 3 (three) times daily as needed. For dry eyes  . cholecalciferol (VITAMIN D) 1000 UNITS tablet Take 1,000 Units by mouth 2 (two) times daily.  Marland Kitchen gabapentin (NEURONTIN) 300 MG capsule Take one capsule by mouth every 12 hours as needed for nerve pain DO NOT FILL UNTIL 2018  . losartan (COZAAR) 50 MG tablet TAKE 1 TABLET EVERY DAY  . metoprolol tartrate (LOPRESSOR) 25 MG tablet Take 1 tablet (25 mg total) by mouth daily.  Marland Kitchen oxybutynin (DITROPAN) 5 MG tablet TAKE 1 TABLET TWICE DAILY  . ranitidine (ZANTAC) 150 MG tablet Take 150 mg by mouth daily.  Marland Kitchen venlafaxine XR (EFFEXOR-XR) 37.5 MG 24 hr capsule Take 1 capsule (37.5 mg total) by mouth daily with breakfast.   No facility-administered encounter medications on file as of 11/19/2016.     Allergies (verified) Codeine; Dilaudid [hydromorphone hcl]; Macrodantin; Morphine and related; Sulfa antibiotics; and Ibuprofen   History: Past Medical History:  Diagnosis Date  . Basal cell cancer    "face, legs" (10/18/2012)  . Bruises easily   . Chronic lower back pain    . Depression    takes Effexor daily  . Diverticulosis of sigmoid colon 11-20-2009  . Dry eyes    uses Refresh eye drops daily as needed  . Dysphagia   . Gastric ulcer   . GERD (gastroesophageal reflux disease)    takes Omeprazole daily  . H/O hiatal hernia   . Hemochromatosis 1987  . Hemochromatosis   . Hemorrhoids, internal 11-20-09  . History of blood transfusion    "S/P colonoscopy after polyp removed; got 2 units; esophageal bleed got 1 unit; really messed up hemochromatosis" (1/7/20214)  . History of bronchitis 1991-1996   "chronic; related to winter" (10/18/2012)  . History of colonic polyps    NOTED 11-20-09 IN COLONOSCOPY REPORT  . Hyperlipidemia    no meds required  . Hypertension    takes Metoprolol and Losartan daily  . Joint pain   . Joint swelling   . Muscle pain   . Muscle spasm    takes Robaxin daily as needed  . Occasional tremors   . Osteoarthritis   . Osteoarthritis of right hip 10/18/2012  . Osteoporosis   . Overactive bladder    takes Myrbetriq daily  . PONV (postoperative nausea and vomiting)   . Rheumatic fever    hx of  . Squamous carcinoma    "LLE" (10/18/2012)  . Stenosis of esophagus   . Tremor, essential 10-29-09  .  Urinary urgency    takes Ditropan daily  . Vomiting    occasionally   Past Surgical History:  Procedure Laterality Date  . APPENDECTOMY  1952  . CATARACT EXTRACTION W/ INTRAOCULAR LENS  IMPLANT, BILATERAL  2001   "both eyes" (10/18/2012)  . CHOLECYSTECTOMY  1977  . COLONOSCOPY    . DILATION AND CURETTAGE OF UTERUS  1975  . ESOPHAGOGASTRODUODENOSCOPY    . [place]  . SHOULDER ARTHROSCOPY W/ ROTATOR CUFF REPAIR  1998?   "left" (10/18/2012)  . SKIN CANCER EXCISION     "multiple" (10/18/2012)  . TONSILLECTOMY  1953  . TOTAL HIP ARTHROPLASTY  10/18/2012   "right" (10/18/2012)  . TOTAL HIP ARTHROPLASTY  10/18/2012   Procedure: TOTAL HIP ARTHROPLASTY;  Surgeon: Johnny Bridge, MD;  Location: [place];  Service: Orthopedics;   Laterality: Right;  . TOTAL HIP ARTHROPLASTY Left 07/24/2014   dr Mardelle Matte  . TOTAL HIP ARTHROPLASTY Left 07/24/2014   Procedure: LEFT TOTAL HIP ARTHROPLASTY;  Surgeon: Johnny Bridge, MD;  Location: [place];  Service: Orthopedics;  Laterality: Left;  . TUBAL LIGATION  1975   Family History  Problem Relation Age of Onset  . Heart disease Mother   . Heart disease Father   . Hyperlipidemia Daughter   . Heart disease Brother   . Heart disease Brother   . Heart disease Brother   . Heart disease Brother   . Cancer Sister   . Melanoma Sister   . Alzheimer's disease Sister   . Heart disease Sister   . Osteoporosis Sister    Social History   Occupational History  . Not on file.   Social History Main Topics  . Smoking status: Never Smoker  . Smokeless tobacco: Never Used  . Alcohol use No  . Drug use: No  . Sexual activity: No    Tobacco Counseling Counseling given: No   Activities of Daily Living In your present state of health, do you have any difficulty performing the following activities: 11/19/2016  Hearing? N  Vision? N  Difficulty concentrating or making decisions? Y  Walking or climbing stairs? N  Dressing or bathing? N  Doing errands, shopping? N  Preparing Food and eating ? N  Using the Toilet? N  In the past six months, have you accidently leaked urine? Y  Do you have problems with loss of bowel control? N  Managing your Medications? N  Managing your Finances? N  Housekeeping or managing your Housekeeping? N  Some recent data might be hidden    Immunizations and Health Maintenance Immunization History  Administered Date(s) Administered  . Influenza, High Dose Seasonal PF 07/08/2016  . Influenza-Unspecified 06/12/2012, 07/04/2013, 06/23/2014, 07/09/2015  . Pneumococcal Conjugate-13 05/14/2016  . Pneumococcal Polysaccharide-23 07/25/2014  . Td 04/19/2007  . Tdap 05/27/2013  . Zoster 09/24/2011   There are no preventive care reminders to display for this  patient.  Patient Care Team: Estill Dooms, MD as PCP - General (Internal Medicine)  Indicate any recent Medical Services you may have received from other than [facility] providers in the past year (date may be approximate).     Assessment:   This is a routine wellness examination for Renella.  Hearing/Vision screen Hearing Screening Comments: Pt states she has never had a hearing screen. She states she has noticed a decrease in hearing. Pt denies getting a referral to audiology at this time.  Vision Screening Comments: Pt had an eye exam last year with Dr.  Herbert Deaner, around April/May.  Dietary issues and exercise activities discussed: Current Exercise Habits: The patient does not participate in regular exercise at present, Exercise limited by: None identified  Goals    . Gain weight          Starting 11/19/16, I will attempt to increase my weight by 10-15 lbs. I would like to weight around 130-135 lb.       Depression Screen PHQ 2/9 Scores 11/19/2016 11/21/2015 07/16/2015 05/25/2013 01/12/2013  PHQ - 2 Score 0 0 0 0 0    Fall Risk Fall Risk  11/19/2016 05/14/2016 11/21/2015 07/16/2015 01/14/2015  Falls in the past year? No No No No No  Risk for fall due to : History of fall(s) - - - -    Cognitive Function: MMSE - Mini Mental State Exam 11/19/2016  Orientation to time 5  Orientation to Place 5  Registration 3  Attention/ Calculation 5  Recall 3  Language- name 2 objects 2  Language- repeat 1  Language- follow 3 step command 3  Language- read & follow direction 1  Write a sentence 1  Copy design 1  Total score 30        Screening Tests Health Maintenance  Topic Date Due  . MAMMOGRAM  01/16/2017  . TETANUS/TDAP  05/28/2023  . INFLUENZA VACCINE  Completed  . DEXA SCAN  Completed  . ZOSTAVAX  Completed  . PNA vac Low Risk Adult  Completed      Plan:    I have personally reviewed and addressed the Medicare Annual Wellness questionnaire and have noted the following in the patient's  chart:  A. Medical and social history B. Use of alcohol, tobacco or illicit drugs  C. Current medications and supplements D. Functional ability and status E.  Nutritional status F.  Physical activity G. Advance directives H. List of other physicians I.  Hospitalizations, surgeries, and ER visits in previous 12 months J.  [place] to include hearing, vision, cognitive, depression L. Referrals and appointments - none  In addition, I have reviewed and discussed with patient certain preventive protocols, quality metrics, and best practice recommendations. A written personalized care plan for preventive services as well as general preventive health recommendations were provided to patient.  See attached scanned questionnaire for additional information.   Signed,   Allyn Kenner, LPN Health Advisor  I reviewed health advisors note, was available for consultation and agree with documentation and plan.  Carlos American. Harle Battiest  [facility] [phone] 8 am - 5 pm) [phone] (after hours)

## 2016-11-19 NOTE — Patient Instructions (Addendum)
Okay to stop gabapentin, will take off medication list so if you have to restart let us know  Cont Effexor as prescribed  May use myrbetriq instead of oxybutynin

## 2016-11-19 NOTE — Patient Instructions (Addendum)
Denise Lane , Thank you for taking time to come for your Medicare Wellness Visit. I appreciate your ongoing commitment to your health goals. Please review the following plan we discussed and let me know if I can assist you in the future.   These are the goals we discussed: Goals    . Gain weight          Starting 11/19/16, I will attempt to increase my weight by 10-15 lbs. I would like to weight around 130-135 lb.        This is a list of the screening recommended for you and due dates:  Health Maintenance  Topic Date Due  . Mammogram  01/16/2017  . Tetanus Vaccine  05/28/2023  . Flu Shot  Completed  . DEXA scan (bone density measurement)  Completed  . Shingles Vaccine  Completed  . Pneumonia vaccines  Completed  Preventive Care for Adults  A healthy lifestyle and preventive care can promote health and wellness. Preventive health guidelines for adults include the following key practices.  . A routine yearly physical is a good way to check with your health care provider about your health and preventive screening. It is a chance to share any concerns and updates on your health and to receive a thorough exam.  . Visit your dentist for a routine exam and preventive care every 6 months. Brush your teeth twice a day and floss once a day. Good oral hygiene prevents tooth decay and gum disease.  . The frequency of eye exams is based on your age, health, family medical history, use  of contact lenses, and other factors. Follow your health care provider's ecommendations for frequency of eye exams.  . Eat a healthy diet. Foods like vegetables, fruits, whole grains, low-fat dairy products, and lean protein foods contain the nutrients you need without too many calories. Decrease your intake of foods high in solid fats, added sugars, and salt. Eat the right amount of calories for you. Get information about a proper diet from your health care provider, if necessary.  . Regular physical exercise is  one of the most important things you can do for your health. Most adults should get at least 150 minutes of moderate-intensity exercise (any activity that increases your heart rate and causes you to sweat) each week. In addition, most adults need muscle-strengthening exercises on 2 or more days a week.  Silver Sneakers may be a benefit available to you. To determine eligibility, you may visit the website: www.silversneakers.com or contact program at [phone] Mon-Fri between 8AM-8PM.   . Maintain a healthy weight. The body mass index (BMI) is a screening tool to identify possible weight problems. It provides an estimate of body fat based on height and weight. Your health care provider can find your BMI and can help you achieve or maintain a healthy weight.   For adults 20 years and older: ? A BMI below 18.5 is considered underweight. ? A BMI of 18.5 to 24.9 is normal. ? A BMI of 25 to 29.9 is considered overweight. ? A BMI of 30 and above is considered obese.   . Maintain normal blood lipids and cholesterol levels by exercising and minimizing your intake of saturated fat. Eat a balanced diet with plenty of fruit and vegetables. Blood tests for lipids and cholesterol should begin at age 33 and be repeated every 5 years. If your lipid or cholesterol levels are high, you are over 50, or you are at high risk for heart  disease, you may need your cholesterol levels checked more frequently. Ongoing high lipid and cholesterol levels should be treated with medicines if diet and exercise are not working.  . If you smoke, find out from your health care provider how to quit. If you do not use tobacco, please do not start.  . If you choose to drink alcohol, please do not consume more than 2 drinks per day. One drink is considered to be 12 ounces (355 mL) of beer, 5 ounces (148 mL) of wine, or 1.5 ounces (44 mL) of liquor.  . If you are 27-82 years old, ask your health care provider if you should take  aspirin to prevent strokes.  . Use sunscreen. Apply sunscreen liberally and repeatedly throughout the day. You should seek shade when your shadow is shorter than you. Protect yourself by wearing long sleeves, pants, a wide-brimmed hat, and sunglasses year round, whenever you are outdoors.  . Once a month, do a whole body skin exam, using a mirror to look at the skin on your back. Tell your health care provider of new moles, moles that have irregular borders, moles that are larger than a pencil eraser, or moles that have changed in shape or color.

## 2016-11-19 NOTE — Progress Notes (Signed)
Quick Notes   Health Maintenance:  None     Abnormal Screen: None; MMSE-30/30 Passed Clock test    Patient Concerns:  Pt would like to discuss coming off of her Neurontin and Effexor-XR.    Nurse Concerns:  None

## 2017-01-19 ENCOUNTER — Other Ambulatory Visit: Payer: Self-pay | Admitting: Internal Medicine

## 2017-01-19 DIAGNOSIS — I10 Essential (primary) hypertension: Secondary | ICD-10-CM

## 2017-01-29 ENCOUNTER — Other Ambulatory Visit (HOSPITAL_BASED_OUTPATIENT_CLINIC_OR_DEPARTMENT_OTHER): Payer: Medicare HMO

## 2017-01-29 LAB — CBC WITH DIFFERENTIAL/PLATELET
BASO%: 0.9 % (ref 0.0–2.0)
Basophils Absolute: 0.1 10*3/uL (ref 0.0–0.1)
EOS ABS: 0.2 10*3/uL (ref 0.0–0.5)
EOS%: 3.1 % (ref 0.0–7.0)
HEMATOCRIT: 38.5 % (ref 34.8–46.6)
HGB: 13.1 g/dL (ref 11.6–15.9)
LYMPH#: 2.1 10*3/uL (ref 0.9–3.3)
LYMPH%: 26.9 % (ref 14.0–49.7)
MCH: 33.7 pg (ref 25.1–34.0)
MCHC: 34.1 g/dL (ref 31.5–36.0)
MCV: 98.6 fL (ref 79.5–101.0)
MONO#: 0.7 10*3/uL (ref 0.1–0.9)
MONO%: 9.4 % (ref 0.0–14.0)
NEUT#: 4.6 10*3/uL (ref 1.5–6.5)
NEUT%: 59.7 % (ref 38.4–76.8)
PLATELETS: 242 10*3/uL (ref 145–400)
RBC: 3.9 10*6/uL (ref 3.70–5.45)
RDW: 12.4 % (ref 11.2–14.5)
WBC: 7.7 10*3/uL (ref 3.9–10.3)

## 2017-01-29 LAB — IRON AND TIBC
%SAT: 93 % — ABNORMAL HIGH (ref 21–57)
Iron: 204 ug/dL — ABNORMAL HIGH (ref 41–142)
TIBC: 220 ug/dL — AB (ref 236–444)
UIBC: 16 ug/dL — AB (ref 120–384)

## 2017-01-29 LAB — FERRITIN: Ferritin: 105 ng/ml (ref 9–269)

## 2017-02-01 ENCOUNTER — Other Ambulatory Visit: Payer: Self-pay | Admitting: Internal Medicine

## 2017-02-01 DIAGNOSIS — F329 Major depressive disorder, single episode, unspecified: Secondary | ICD-10-CM

## 2017-02-01 DIAGNOSIS — F419 Anxiety disorder, unspecified: Principal | ICD-10-CM

## 2017-02-01 DIAGNOSIS — F32A Depression, unspecified: Secondary | ICD-10-CM

## 2017-02-04 NOTE — Progress Notes (Signed)
[place] OFFICE PROGRESS NOTE  Denise Hubert, MD [phone] [address]  DIAGNOSIS: Hemochromatosis, unspecified hemochromatosis type  Chief Complaint  Patient presents with  . Follow-up    hemochromatosis    CURRENT TREATMENT: PRN phlebotomy to keep ferritin <=100, and transferrin saturation less than 75%  INTERVAL HISTORY: Denise Lane 83 y.o. female with a history of hemochromatosis is here for follow up. She feels well, but is fatigued from taking care of her husband. She reports feeling well after getting phlebotomies.  MEDICAL HISTORY: Past Medical History:  Diagnosis Date  . Basal cell cancer    "face, legs" (10/18/2012)  . Bruises easily   . Chronic lower back pain   . Depression    takes Effexor daily  . Diverticulosis of sigmoid colon 11-20-2009  . Dry eyes    uses Refresh eye drops daily as needed  . Dysphagia   . Gastric ulcer   . GERD (gastroesophageal reflux disease)    takes Omeprazole daily  . H/O hiatal hernia   . Hemochromatosis 1987  . Hemochromatosis   . Hemorrhoids, internal 11-20-09  . History of blood transfusion    "S/P colonoscopy after polyp removed; got 2 units; esophageal bleed got 1 unit; really messed up hemochromatosis" (1/7/20214)  . History of bronchitis 1991-1996   "chronic; related to winter" (10/18/2012)  . History of colonic polyps    NOTED 11-20-09 IN COLONOSCOPY REPORT  . Hyperlipidemia    no meds required  . Hypertension    takes Metoprolol and Losartan daily  . Joint pain   . Joint swelling   . Muscle pain   . Muscle spasm    takes Robaxin daily as needed  . Occasional tremors   . Osteoarthritis   . Osteoarthritis of right hip 10/18/2012  . Osteoporosis   . Overactive bladder    takes Myrbetriq daily  . PONV (postoperative nausea and vomiting)   . Rheumatic fever    hx of  . Squamous carcinoma    "LLE" (10/18/2012)  . Stenosis of esophagus   . Tremor, essential 10-29-09  . Urinary  urgency    takes Ditropan daily  . Vomiting    occasionally    INTERIM HISTORY: has Hemochromatosis; Osteoarthritis of right hip; Muscle pain; Hyperlipidemia; Urinary incontinence; Back pain; Osteoarthritis; Neuropathy; Anxiety and depression; Osteoarthritis of left hip; Right thigh pain; Periprosthetic hip fracture; Hip fracture ([place]); Postoperative anemia due to acute blood loss; Leg cramps; and Morton's neuroma of left foot on her problem list.    ALLERGIES:  is allergic to codeine; dilaudid [hydromorphone hcl]; macrodantin; morphine and related; sulfa antibiotics; and ibuprofen.  MEDICATIONS: has a current medication list which includes the following prescription(s): acetaminophen, calcium citrate, carboxymethylcellulose, cholecalciferol, gabapentin, losartan, metoprolol tartrate, oxybutynin, ranitidine, and venlafaxine xr.  SURGICAL HISTORY:  Past Surgical History:  Procedure Laterality Date  . APPENDECTOMY  1952  . CATARACT EXTRACTION W/ INTRAOCULAR LENS  IMPLANT, BILATERAL  2001   "both eyes" (10/18/2012)  . CHOLECYSTECTOMY  1977  . COLONOSCOPY    . DILATION AND CURETTAGE OF UTERUS  1975  . ESOPHAGOGASTRODUODENOSCOPY    . [place]  . SHOULDER ARTHROSCOPY W/ ROTATOR CUFF REPAIR  1998?   "left" (10/18/2012)  . SKIN CANCER EXCISION     "multiple" (10/18/2012)  . TONSILLECTOMY  1953  . TOTAL HIP ARTHROPLASTY  10/18/2012   "right" (10/18/2012)  . TOTAL HIP ARTHROPLASTY  10/18/2012   Procedure: TOTAL HIP ARTHROPLASTY;  Surgeon: Johnny Bridge, MD;  Location: [place] [facility];  Service: Orthopedics;  Laterality: Right;  . TOTAL HIP ARTHROPLASTY Left 07/24/2014   dr Mardelle Matte  . TOTAL HIP ARTHROPLASTY Left 07/24/2014   Procedure: LEFT TOTAL HIP ARTHROPLASTY;  Surgeon: Johnny Bridge, MD;  Location: [place];  Service: Orthopedics;  Laterality: Left;  . TUBAL LIGATION  1975    REVIEW OF SYSTEMS:   Constitutional: Denies fevers, chills or abnormal weight loss (+) tiredness Eyes: Denies  blurriness of vision Ears, nose, mouth, throat, and face: Denies mucositis or sore throat Respiratory: Denies cough, dyspnea or wheezes Cardiovascular: Denies palpitation, chest discomfort or lower extremity swelling Gastrointestinal:  Denies nausea, heartburn or change in bowel habits Skin: Denies abnormal skin rashes Lymphatics: Denies new lymphadenopathy or easy bruising Neurological:Denies numbness, tingling or new weaknesses Behavioral/Psych: Mood is stable, no new changes  All other systems were reviewed with the patient and are negative.  PHYSICAL EXAMINATION: ECOG PERFORMANCE STATUS: 1 - Symptomatic but completely ambulatory  Blood pressure (!) 144/66, pulse 80, resp. rate 16, height 5\' 6"  (1.676 m), weight 119 lb (54 kg), SpO2 98 %.  GENERAL:alert, no distress and comfortable; elderly, thin and ambulates with a cane SKIN: skin color, texture, turgor are normal, no rashes or significant lesions EYES: normal, Conjunctiva are pink and non-injected, sclera clear OROPHARYNX:no exudate, no erythema and lips, buccal mucosa, and tongue normal  NECK: supple, thyroid normal size, non-tender, without nodularity LYMPH:  no palpable lymphadenopathy in the cervical, axillary or supraclavicular LUNGS: clear to auscultation with normal breathing effort, no wheezes or rhonchi HEART: regular rate & rhythm and no murmurs and no lower extremity edema ABDOMEN:abdomen soft, non-tender and normal bowel sounds Musculoskeletal:no cyanosis of digits and no clubbing; Stable arthritic changes to fingers bilaterally NEURO: alert & oriented x 3 with fluent speech, no focal motor/sensory deficits  Labs:  CBC Latest Ref Rng & Units 01/29/2017 11/02/2016 07/30/2016  WBC 3.9 - 10.3 10e3/uL 7.7 6.9 8.1  Hemoglobin 11.6 - 15.9 g/dL 13.1 12.3 13.2  Hematocrit 34.8 - 46.6 % 38.5 36.8 38.8  Platelets 145 - 400 10e3/uL 242 237 214    CMP Latest Ref Rng & Units 07/30/2016 02/05/2016 07/16/2015  Glucose 65 - 99  mg/dL - - 112(H)  BUN 4 - 21 mg/dL 23(A) 27(A) 24  Creatinine 0.5 - 1.1 mg/dL 0.7 0.6 0.66  Sodium 137 - 147 mmol/L 141 140 141  Potassium 3.4 - 5.3 mmol/L 4.6 4.4 4.5  Chloride 97 - 108 mmol/L - - 97  CO2 18 - 29 mmol/L - - 27  Calcium 8.7 - 10.3 mg/dL - - 9.8  Total Protein 6.0 - 8.5 g/dL - - 6.5  Total Bilirubin 0.0 - 1.2 mg/dL - - 0.3  Alkaline Phos 25 - 125 U/L 81 - 104  AST 13 - 35 U/L 19 - 16  ALT 7 - 35 U/L 16 - 12   Results for Denise, Lane (MRN [id number]) as of 02/04/2017 16:35  Ref. Range 07/30/2016 13:04 11/02/2016 11:35 01/29/2017 11:44  Iron Latest Ref Range: 41 - 142 ug/dL 215 (H) 172 (H) 204 (H)  UIBC Latest Ref Range: 120 - 384 ug/dL 11 (L) 51 (L) 16 (L)  TIBC Latest Ref Range: 236 - 444 ug/dL 226 (L) 223 (L) 220 (L)  %SAT Latest Ref Range: 21 - 57 % 95 (H) 77 (H) 93 (H)  Ferritin Latest Ref Range: 9 - 269 ng/ml 80 96 105    RADIOGRAPHIC STUDIES: No results found.  ASSESSMENT: Denise Lane 83 y.o. female with a history of Hemochromatosis, unspecified hemochromatosis type   PLAN:  1.Hemachromatosis:  -She is clinically doing very well. -given her advanced age and symptomatic phlebotomy (presyncope), I will only do phlebotomize if her ferritin >100 or very high transferrin saturation.   -she has been tolerating Phlebotomy well lately, and does feel better overall after phlebotomy. -Her serum iron and transferrin saturation is remaining to be very high lately, I recommend her to repeat lab more frequent, to every 2 months. If her ferritin >100 or transferrin saturation>75%, i will recommend phlebotomy, I'll do 0.75 unit with normal saline 250 mL infusion. -Her liver ultrasound on 10/2016 showed no liver or spleen abnormality. We'll repeat once a year. -We'll do phlebotomy today based on her lab results from 01/29/17.  PLAN -phlebotomy today -return to clinic for lab and phlebotomy every 2 months (change from every 3 month), OK to proceed phlebotomy based  on last lab results and no anemia on same day CBC -f/u in 6 months  All questions were answered. The patient knows to call the clinic with any problems, questions or concerns. We can certainly see the patient much sooner if necessary.  I spent 10 minutes counseling the patient face to face. The total time spent in the appointment was 15 minutes.    Truitt Merle, MD 02/05/2017   This document serves as a record of services personally performed by Truitt Merle, MD. It was created on her behalf by Darcus Austin, a trained medical scribe. The creation of this record is based on the scribe's personal observations and the provider's statements to them. This document has been checked and approved by the attending provider.

## 2017-02-05 ENCOUNTER — Ambulatory Visit (HOSPITAL_BASED_OUTPATIENT_CLINIC_OR_DEPARTMENT_OTHER): Payer: Medicare HMO

## 2017-02-05 ENCOUNTER — Ambulatory Visit (HOSPITAL_BASED_OUTPATIENT_CLINIC_OR_DEPARTMENT_OTHER): Payer: Medicare HMO | Admitting: Hematology

## 2017-02-05 ENCOUNTER — Encounter: Payer: Self-pay | Admitting: Hematology

## 2017-02-05 ENCOUNTER — Telehealth: Payer: Self-pay | Admitting: Hematology

## 2017-02-05 MED ORDER — SODIUM CHLORIDE 0.9 % IV SOLN
Freq: Once | INTRAVENOUS | Status: AC
  Administered 2017-02-05: 15:00:00 via INTRAVENOUS

## 2017-02-05 NOTE — Patient Instructions (Signed)
     Therapeutic Phlebotomy, Care After Refer to this sheet in the next few weeks. These instructions provide you with information about caring for yourself after your procedure. Your health care provider may also give you more specific instructions. Your treatment has been planned according to current medical practices, but problems sometimes occur. Call your health care provider if you have any problems or questions after your procedure. What can I expect after the procedure? After the procedure, it is common to have:  Light-headedness or dizziness. You may feel faint.  Nausea.  Tiredness. Follow these instructions at home: Activity  Return to your normal activities as directed by your health care provider. Most people can go back to their normal activities right away.  Avoid strenuous physical activity and heavy lifting or pulling for about 5 hours after the procedure. Do not lift anything that is heavier than 10 lb (4.5 kg).  Athletes should avoid strenuous exercise for at least 12 hours.  Change positions slowly for the remainder of the day. This will help to prevent light-headedness or fainting.  If you feel light-headed, lie down until the feeling goes away. Eating and drinking  Be sure to eat well-balanced meals for the next 24 hours.  Drink enough fluid to keep your urine clear or pale yellow.  Avoid drinking alcohol on the day that you had the procedure. Care of the Needle Insertion Site  Keep your bandage dry. You can remove the bandage after about 5 hours or as directed by your health care provider.  If you have bleeding from the needle insertion site, elevate your arm and press firmly on the site until the bleeding stops.  If you have bruising at the site, apply ice to the area:  Put ice in a plastic bag.  Place a towel between your skin and the bag.  Leave the ice on for 20 minutes, 2-3 times a day for the first 24 hours.  If the swelling does not go away  after 24 hours, apply a warm, moist washcloth to the area for 20 minutes, 2-3 times a day. General instructions  Avoid smoking for at least 30 minutes after the procedure.  Keep all follow-up visits as directed by your health care provider. It is important to continue with further therapeutic phlebotomy treatments as directed. Contact a health care provider if:  You have redness, swelling, or pain at the needle insertion site.  You have fluid, blood, or pus coming from the needle insertion site.  You feel light-headed, dizzy, or nauseated, and the feeling does not go away.  You notice new bruising at the needle insertion site.  You feel weaker than normal.  You have a fever or chills. Get help right away if:  You have severe nausea or vomiting.  You have chest pain.  You have trouble breathing. This information is not intended to replace advice given to you by your health care provider. Make sure you discuss any questions you have with your health care provider. Document Released: 03/02/2011 Document Revised: 05/30/2016 Document Reviewed: 09/24/2014 Elsevier Interactive Patient Education  2017 Elsevier Inc.  

## 2017-02-05 NOTE — Progress Notes (Signed)
Per Mrytle RN per Dr. Burr Medico only removed 350 -375 grams blood, and give 500 ml NS over 1 hour post phlebotomy.  Therapeutic Phlebotomy performed per Dr. Burr Medico Orders 373 grams blood removed using 18  Gauge PIV in left AC, starting at 1448 and ending at 1500. Pt tolerated procedure well. Pt eating snack and drinking a drink.

## 2017-02-05 NOTE — Telephone Encounter (Signed)
Appointments scheduled per 02/05/17 los. Patient was given a copy of the AVS report and appointment schedule per 02/05/17 los. °

## 2017-02-19 DIAGNOSIS — Z1231 Encounter for screening mammogram for malignant neoplasm of breast: Secondary | ICD-10-CM | POA: Diagnosis not present

## 2017-02-19 LAB — HM MAMMOGRAPHY

## 2017-03-12 DIAGNOSIS — H3561 Retinal hemorrhage, right eye: Secondary | ICD-10-CM | POA: Diagnosis not present

## 2017-03-12 DIAGNOSIS — H35033 Hypertensive retinopathy, bilateral: Secondary | ICD-10-CM | POA: Diagnosis not present

## 2017-03-12 DIAGNOSIS — H353131 Nonexudative age-related macular degeneration, bilateral, early dry stage: Secondary | ICD-10-CM | POA: Diagnosis not present

## 2017-03-12 DIAGNOSIS — H40013 Open angle with borderline findings, low risk, bilateral: Secondary | ICD-10-CM | POA: Diagnosis not present

## 2017-04-09 ENCOUNTER — Other Ambulatory Visit (HOSPITAL_BASED_OUTPATIENT_CLINIC_OR_DEPARTMENT_OTHER): Payer: Medicare HMO

## 2017-04-09 ENCOUNTER — Ambulatory Visit (HOSPITAL_BASED_OUTPATIENT_CLINIC_OR_DEPARTMENT_OTHER): Payer: Medicare HMO

## 2017-04-09 ENCOUNTER — Other Ambulatory Visit: Payer: Self-pay | Admitting: *Deleted

## 2017-04-09 LAB — CBC WITH DIFFERENTIAL/PLATELET
BASO%: 0.5 % (ref 0.0–2.0)
Basophils Absolute: 0 10*3/uL (ref 0.0–0.1)
EOS%: 2.3 % (ref 0.0–7.0)
Eosinophils Absolute: 0.2 10*3/uL (ref 0.0–0.5)
HEMATOCRIT: 37.8 % (ref 34.8–46.6)
HEMOGLOBIN: 12.8 g/dL (ref 11.6–15.9)
LYMPH#: 2.3 10*3/uL (ref 0.9–3.3)
LYMPH%: 33.3 % (ref 14.0–49.7)
MCH: 33.4 pg (ref 25.1–34.0)
MCHC: 33.9 g/dL (ref 31.5–36.0)
MCV: 98.6 fL (ref 79.5–101.0)
MONO#: 0.8 10*3/uL (ref 0.1–0.9)
MONO%: 10.7 % (ref 0.0–14.0)
NEUT%: 53.2 % (ref 38.4–76.8)
NEUTROS ABS: 3.7 10*3/uL (ref 1.5–6.5)
PLATELETS: 247 10*3/uL (ref 145–400)
RBC: 3.83 10*6/uL (ref 3.70–5.45)
RDW: 11.8 % (ref 11.2–14.5)
WBC: 7 10*3/uL (ref 3.9–10.3)

## 2017-04-09 LAB — IRON AND TIBC
%SAT: 81 % — AB (ref 21–57)
IRON: 179 ug/dL — AB (ref 41–142)
TIBC: 221 ug/dL — ABNORMAL LOW (ref 236–444)
UIBC: 43 ug/dL — ABNORMAL LOW (ref 120–384)

## 2017-04-09 LAB — FERRITIN: FERRITIN: 100 ng/mL (ref 9–269)

## 2017-04-09 MED ORDER — SODIUM CHLORIDE 0.9 % IV SOLN
Freq: Once | INTRAVENOUS | Status: AC
  Administered 2017-04-09: 15:00:00 via INTRAVENOUS

## 2017-04-09 MED ORDER — SODIUM CHLORIDE 0.9 % IV SOLN: Freq: Once | INTRAVENOUS | Status: DC

## 2017-04-09 NOTE — Patient Instructions (Signed)
     Therapeutic Phlebotomy, Care After Refer to this sheet in the next few weeks. These instructions provide you with information about caring for yourself after your procedure. Your health care provider may also give you more specific instructions. Your treatment has been planned according to current medical practices, but problems sometimes occur. Call your health care provider if you have any problems or questions after your procedure. What can I expect after the procedure? After the procedure, it is common to have:  Light-headedness or dizziness. You may feel faint.  Nausea.  Tiredness. Follow these instructions at home: Activity  Return to your normal activities as directed by your health care provider. Most people can go back to their normal activities right away.  Avoid strenuous physical activity and heavy lifting or pulling for about 5 hours after the procedure. Do not lift anything that is heavier than 10 lb (4.5 kg).  Athletes should avoid strenuous exercise for at least 12 hours.  Change positions slowly for the remainder of the day. This will help to prevent light-headedness or fainting.  If you feel light-headed, lie down until the feeling goes away. Eating and drinking  Be sure to eat well-balanced meals for the next 24 hours.  Drink enough fluid to keep your urine clear or pale yellow.  Avoid drinking alcohol on the day that you had the procedure. Care of the Needle Insertion Site  Keep your bandage dry. You can remove the bandage after about 5 hours or as directed by your health care provider.  If you have bleeding from the needle insertion site, elevate your arm and press firmly on the site until the bleeding stops.  If you have bruising at the site, apply ice to the area:  Put ice in a plastic bag.  Place a towel between your skin and the bag.  Leave the ice on for 20 minutes, 2-3 times a day for the first 24 hours.  If the swelling does not go away  after 24 hours, apply a warm, moist washcloth to the area for 20 minutes, 2-3 times a day. General instructions  Avoid smoking for at least 30 minutes after the procedure.  Keep all follow-up visits as directed by your health care provider. It is important to continue with further therapeutic phlebotomy treatments as directed. Contact a health care provider if:  You have redness, swelling, or pain at the needle insertion site.  You have fluid, blood, or pus coming from the needle insertion site.  You feel light-headed, dizzy, or nauseated, and the feeling does not go away.  You notice new bruising at the needle insertion site.  You feel weaker than normal.  You have a fever or chills. Get help right away if:  You have severe nausea or vomiting.  You have chest pain.  You have trouble breathing. This information is not intended to replace advice given to you by your health care provider. Make sure you discuss any questions you have with your health care provider. Document Released: 03/02/2011 Document Revised: 05/30/2016 Document Reviewed: 09/24/2014 Elsevier Interactive Patient Education  2017 Elsevier Inc.  

## 2017-04-09 NOTE — Progress Notes (Signed)
Pt phlebotomized through left AC x 1 attempt with 20 gauge needle.  250g removed.  Pt tolerated well.  Pt received 221ml IVF afterward  Pt had drink and snack prior to discharge.  Pt discharged after 68minute observation.

## 2017-05-20 ENCOUNTER — Ambulatory Visit (INDEPENDENT_AMBULATORY_CARE_PROVIDER_SITE_OTHER): Payer: Medicare HMO | Admitting: Nurse Practitioner

## 2017-05-20 ENCOUNTER — Encounter: Payer: Self-pay | Admitting: Nurse Practitioner

## 2017-05-20 VITALS — BP 136/78 | HR 63 | Temp 98.4°F | Resp 17 | Ht 66.0 in | Wt 120.6 lb

## 2017-05-20 DIAGNOSIS — F329 Major depressive disorder, single episode, unspecified: Secondary | ICD-10-CM

## 2017-05-20 DIAGNOSIS — M1990 Unspecified osteoarthritis, unspecified site: Secondary | ICD-10-CM

## 2017-05-20 DIAGNOSIS — K219 Gastro-esophageal reflux disease without esophagitis: Secondary | ICD-10-CM

## 2017-05-20 DIAGNOSIS — F419 Anxiety disorder, unspecified: Secondary | ICD-10-CM

## 2017-05-20 DIAGNOSIS — N393 Stress incontinence (female) (male): Secondary | ICD-10-CM

## 2017-05-20 DIAGNOSIS — I10 Essential (primary) hypertension: Secondary | ICD-10-CM

## 2017-05-20 DIAGNOSIS — F32A Depression, unspecified: Secondary | ICD-10-CM

## 2017-05-20 DIAGNOSIS — M8580 Other specified disorders of bone density and structure, unspecified site: Secondary | ICD-10-CM

## 2017-05-20 DIAGNOSIS — E785 Hyperlipidemia, unspecified: Secondary | ICD-10-CM | POA: Diagnosis not present

## 2017-05-20 MED ORDER — PANTOPRAZOLE SODIUM 40 MG PO TBEC: 40.0000 mg | DELAYED_RELEASE_TABLET | Freq: Every day | ORAL | 1 refills | Status: DC

## 2017-05-20 MED ORDER — ZOSTER VAC RECOMB ADJUVANTED 50 MCG/0.5ML IM SUSR: 0.5000 mL | Freq: Once | INTRAMUSCULAR | 1 refills | Status: AC

## 2017-05-20 NOTE — Progress Notes (Signed)
Careteam: Patient Care Team: Lauree Chandler, NP as PCP - General (Geriatric Medicine)  Advanced Directive information Does Patient Have a Medical Advance Directive?: Yes, Type of Advance Directive: [place];Living will  Allergies  Allergen Reactions  . Codeine Nausea And Vomiting  . Dilaudid [Hydromorphone Hcl] Hives and Other (See Comments)    "whelps" (10/18/2012)  . Macrodantin Hives and Other (See Comments)    "drug fever; chills; felt terrible" (10/18/2012)  . Morphine And Related Hives and Nausea And Vomiting  . Sulfa Antibiotics Hives, Rash and Other (See Comments)    "got real sick" (10/18/2012)  . Ibuprofen Other (See Comments)    "felt like I have to go to the bathroom constantly"    Chief Complaint  Patient presents with  . Medical Management of Chronic Issues    Pt is being seen for a 6 month visit.      HPI: Patient is a 83 y.o. female seen in the office today for routine follow up.  Pt with hx of hemochromatosis, htn, anxiety, GERD, neuropathy  Stopped Neurontin, no burning or neuropathy at this time Ongoing anxiety- wants to come off of Effexor but daughter does not wish for her to stop this medication and therefore she is agreeable to cont.  GERD- controlled on zantac, occasionally will have trouble swallowing and burping if she over act. Hx of HH. Previously saw GI but no longer follows with them [facility]- . Tried myrbetriq but could not afford, also mentions she was having more incontinence while on this. Currently on oxybutynin which helps.   More of a cough. Daughter states she came off her omeprazole due to "side effects" she read about but was doing well on this    Review of Systems:  Review of Systems  Constitutional: Negative for chills, fever and weight loss.  HENT: Negative for tinnitus.   Respiratory: Positive for cough. Negative for sputum production and shortness of breath.   Cardiovascular: Negative for chest pain,  palpitations and leg swelling.  Gastrointestinal: Positive for heartburn. Negative for abdominal pain, constipation and diarrhea.  Genitourinary: Positive for frequency. Negative for dysuria and urgency.  Musculoskeletal: Positive for back pain. Negative for falls, joint pain and myalgias.  Skin: Negative.   Neurological: Negative for dizziness and headaches.  Psychiatric/Behavioral: Negative for depression and memory loss. The patient is nervous/anxious. The patient does not have insomnia.     Past Medical History:  Diagnosis Date  . Basal cell cancer    "face, legs" (10/18/2012)  . Bruises easily   . Chronic lower back pain   . Depression    takes Effexor daily  . Diverticulosis of sigmoid colon 11-20-2009  . Dry eyes    uses Refresh eye drops daily as needed  . Dysphagia   . Gastric ulcer   . GERD (gastroesophageal reflux disease)    takes Omeprazole daily  . H/O hiatal hernia   . Hemochromatosis 1987  . Hemochromatosis   . Hemorrhoids, internal 11-20-09  . History of blood transfusion    "S/P colonoscopy after polyp removed; got 2 units; esophageal bleed got 1 unit; really messed up hemochromatosis" (1/7/20214)  . History of bronchitis 1991-1996   "chronic; related to winter" (10/18/2012)  . History of colonic polyps    NOTED 11-20-09 IN COLONOSCOPY REPORT  . Hyperlipidemia    no meds required  . Hypertension    takes Metoprolol and Losartan daily  . Joint pain   . Joint swelling   .  Muscle pain   . Muscle spasm    takes Robaxin daily as needed  . Occasional tremors   . Osteoarthritis   . Osteoarthritis of right hip 10/18/2012  . Osteoporosis   . Overactive bladder    takes Myrbetriq daily  . PONV (postoperative nausea and vomiting)   . Rheumatic fever    hx of  . Squamous carcinoma    "LLE" (10/18/2012)  . Stenosis of esophagus   . Tremor, essential 10-29-09  . Urinary urgency    takes Ditropan daily  . Vomiting    occasionally   Past Surgical History:  Procedure  Laterality Date  . APPENDECTOMY  1952  . CATARACT EXTRACTION W/ INTRAOCULAR LENS  IMPLANT, BILATERAL  2001   "both eyes" (10/18/2012)  . CHOLECYSTECTOMY  1977  . COLONOSCOPY    . DILATION AND CURETTAGE OF UTERUS  1975  . ESOPHAGOGASTRODUODENOSCOPY    . [place]  . SHOULDER ARTHROSCOPY W/ ROTATOR CUFF REPAIR  1998?   "left" (10/18/2012)  . SKIN CANCER EXCISION     "multiple" (10/18/2012)  . TONSILLECTOMY  1953  . TOTAL HIP ARTHROPLASTY  10/18/2012   "right" (10/18/2012)  . TOTAL HIP ARTHROPLASTY  10/18/2012   Procedure: TOTAL HIP ARTHROPLASTY;  Surgeon: Johnny Bridge, MD;  Location: [facility];  Service: Orthopedics;  Laterality: Right;  . TOTAL HIP ARTHROPLASTY Left 07/24/2014   dr Mardelle Matte  . TOTAL HIP ARTHROPLASTY Left 07/24/2014   Procedure: LEFT TOTAL HIP ARTHROPLASTY;  Surgeon: Johnny Bridge, MD;  Location: [facility];  Service: Orthopedics;  Laterality: Left;  . TUBAL LIGATION  1975   Social History:   reports that she has never smoked. She has never used smokeless tobacco. She reports that she does not drink alcohol or use drugs.  Family History  Problem Relation Age of Onset  . Heart disease Mother   . Heart disease Father   . Hyperlipidemia Daughter   . Heart disease Brother   . Heart disease Brother   . Heart disease Brother   . Heart disease Brother   . Cancer Sister   . Melanoma Sister   . Alzheimer's disease Sister   . Heart disease Sister   . Osteoporosis Sister     Medications: Patient's Medications  New Prescriptions   No medications on file  Previous Medications   ACETAMINOPHEN (TYLENOL) 650 MG CR TABLET    Take 650 mg by mouth 2 (two) times daily.    CALCIUM CITRATE (CALCITRATE - DOSED IN MG ELEMENTAL CALCIUM) 950 MG TABLET    Take 1-2 tablets by mouth 3 (three) times daily. 2 tablets in am and 1 tablet with lunch and supper   CARBOXYMETHYLCELLULOSE (REFRESH PLUS) 0.5 % SOLN    Place 1 drop into both eyes 3 (three) times daily as needed. For dry eyes     CHOLECALCIFEROL (VITAMIN D) 1000 UNITS TABLET    Take 1,000 Units by mouth 2 (two) times daily.   LOSARTAN (COZAAR) 50 MG TABLET    TAKE 1 TABLET EVERY DAY   METOPROLOL TARTRATE (LOPRESSOR) 25 MG TABLET    TAKE 1 TABLET EVERY DAY   OXYBUTYNIN (DITROPAN) 5 MG TABLET    TAKE 1 TABLET TWICE DAILY   RANITIDINE (ZANTAC) 150 MG TABLET    Take 150 mg by mouth daily.   VENLAFAXINE XR (EFFEXOR-XR) 37.5 MG 24 HR CAPSULE    TAKE 1 CAPSULE EVERY DAY WITH BREAKFAST  Modified Medications   Modified Medication Previous Medication  ZOSTER VAC RECOMB ADJUVANTED (SHINGRIX) INJECTION Zoster Vac Recomb Adjuvanted (SHINGRIX) injection      Inject 0.5 mLs into the muscle once.    Inject 0.5 mLs into the muscle once.  Discontinued Medications   No medications on file     Physical Exam:  Vitals:   05/20/17 1426  BP: 136/78  Pulse: 63  Resp: 17  Temp: 98.4 F (36.9 C)  TempSrc: Oral  SpO2: 99%  Weight: 120 lb 9.6 oz (54.7 kg)  Height: 5\' 6"  (1.676 m)   Body mass index is 19.47 kg/m.  Physical Exam  Constitutional: She is oriented to person, place, and time.  Pleasant, frail, NAD  HENT:  Head: Normocephalic and atraumatic.  Right Ear: External ear normal.  Left Ear: External ear normal.  Nose: Nose normal.  Mouth/Throat: Oropharynx is clear and moist.  Eyes: Pupils are equal, round, and reactive to light. Conjunctivae and EOM are normal. Right eye exhibits no discharge. Left eye exhibits no discharge. No scleral icterus.  Neck: Normal range of motion. Neck supple. No JVD present.  Cardiovascular: Normal rate, regular rhythm and normal heart sounds.   No murmur heard. Pulmonary/Chest: Effort normal and breath sounds normal. No respiratory distress. She has no wheezes. She has no rales.  Abdominal: Soft. Bowel sounds are normal. She exhibits no distension. There is no tenderness.  Musculoskeletal: Normal range of motion. She exhibits no edema or tenderness.  Neurological: She is alert and  oriented to person, place, and time. She has normal reflexes.  CN II-XI grossly intact  Skin: Skin is warm and dry. No rash noted. No erythema.  Scattered senile purpura  Psychiatric: She has a normal mood and affect. Her behavior is normal. Judgment and thought content normal.    Labs reviewed: Basic Metabolic Panel:  Recent Labs  07/30/16  NA 141  K 4.6  BUN 23*  CREATININE 0.7   Liver Function Tests:  Recent Labs  07/30/16  AST 19  ALT 16  ALKPHOS 81   No results for input(s): LIPASE, AMYLASE in the last 8760 hours. No results for input(s): AMMONIA in the last 8760 hours. CBC:  Recent Labs  11/02/16 1135 01/29/17 1144 04/09/17 1246  WBC 6.9 7.7 7.0  NEUTROABS 4.1 4.6 3.7  HGB 12.3 13.1 12.8  HCT 36.8 38.5 37.8  MCV 99.0 98.6 98.6  PLT 237 242 247   Lipid Panel:  Recent Labs  07/30/16  CHOL 163  HDL 35  LDLCALC 88  TRIG 199*   TSH: No results for input(s): TSH in the last 8760 hours. A1C: No results found for: HGBA1C   Assessment/Plan 1. Osteopenia, unspecified location -Recommended to take caltrate with D 600/400 twice a day with weight bearing exercise.  - DG Bone Density; Future  2. Osteoarthritis, unspecified osteoarthritis type, unspecified site -stable conts on tylenol PRN  3. Anxiety and depression -ongoing, will cont on effexor at this time.   4. Stress incontinence of urine Stable, reports oxybutynin was better for her than myrbetriq. Will cont at this time.   5. Hyperlipidemia, unspecified hyperlipidemia type -will get fasting lipids with next labs (would like to wait and have them done at hematology)   6. Essential hypertension -stable, conts on cozaar and encouraged lifestyle modifications.   7. Gastroesophageal reflux disease without esophagitis -stopped omeprazole but will try pantoprazole to help with GERD and cough.  - pantoprazole (PROTONIX) 40 MG tablet; Take 1 tablet (40 mg total) by mouth daily.  Dispense: 90 tablet;  Refill: 1  Next appt: 6 months and PRN Hebe Merriwether K. Harle Battiest  [facility] [phone] 8 am - 5 pm) [phone] (after hours) `

## 2017-05-20 NOTE — Patient Instructions (Signed)
To start Protonix by mouth daily to help with acid reflux which will help cough Cont all other medication

## 2017-06-03 ENCOUNTER — Other Ambulatory Visit: Payer: Self-pay | Admitting: Hematology

## 2017-06-04 ENCOUNTER — Other Ambulatory Visit (HOSPITAL_BASED_OUTPATIENT_CLINIC_OR_DEPARTMENT_OTHER): Payer: Medicare HMO

## 2017-06-04 ENCOUNTER — Other Ambulatory Visit: Payer: Self-pay | Admitting: Nurse Practitioner

## 2017-06-04 ENCOUNTER — Ambulatory Visit (HOSPITAL_BASED_OUTPATIENT_CLINIC_OR_DEPARTMENT_OTHER): Payer: Medicare HMO

## 2017-06-04 DIAGNOSIS — E785 Hyperlipidemia, unspecified: Secondary | ICD-10-CM | POA: Diagnosis not present

## 2017-06-04 LAB — CBC WITH DIFFERENTIAL/PLATELET
BASO%: 1.5 % (ref 0.0–2.0)
Basophils Absolute: 0.1 10*3/uL (ref 0.0–0.1)
EOS ABS: 0.1 10*3/uL (ref 0.0–0.5)
EOS%: 1.7 % (ref 0.0–7.0)
HCT: 36.2 % (ref 34.8–46.6)
HEMOGLOBIN: 12.1 g/dL (ref 11.6–15.9)
LYMPH%: 32.5 % (ref 14.0–49.7)
MCH: 33.1 pg (ref 25.1–34.0)
MCHC: 33.6 g/dL (ref 31.5–36.0)
MCV: 98.7 fL (ref 79.5–101.0)
MONO#: 0.7 10*3/uL (ref 0.1–0.9)
MONO%: 9.6 % (ref 0.0–14.0)
NEUT%: 54.7 % (ref 38.4–76.8)
NEUTROS ABS: 3.8 10*3/uL (ref 1.5–6.5)
Platelets: 269 10*3/uL (ref 145–400)
RBC: 3.67 10*6/uL — ABNORMAL LOW (ref 3.70–5.45)
RDW: 12.3 % (ref 11.2–14.5)
WBC: 7 10*3/uL (ref 3.9–10.3)
lymph#: 2.3 10*3/uL (ref 0.9–3.3)

## 2017-06-04 LAB — IRON AND TIBC
%SAT: 80 % — AB (ref 21–57)
Iron: 171 ug/dL — ABNORMAL HIGH (ref 41–142)
TIBC: 213 ug/dL — AB (ref 236–444)
UIBC: 42 ug/dL — AB (ref 120–384)

## 2017-06-04 LAB — FERRITIN: FERRITIN: 114 ng/mL (ref 9–269)

## 2017-06-04 MED ORDER — SODIUM CHLORIDE 0.9 % IV SOLN
Freq: Once | INTRAVENOUS | Status: AC
  Administered 2017-06-04: 15:00:00 via INTRAVENOUS

## 2017-06-04 NOTE — Patient Instructions (Signed)

## 2017-06-04 NOTE — Progress Notes (Signed)
Denise Lane presents today for phlebotomy per MD orders. Phlebotomy procedure started at 1450 and ended at 1504. 380 grams removed. Patient observed for 30 minutes after procedure without any incident. Patient received 247mL of NS.  Patient tolerated procedure well. Cyndia Bent RN

## 2017-06-08 ENCOUNTER — Encounter: Payer: Self-pay | Admitting: Nurse Practitioner

## 2017-06-08 LAB — LIPID PANEL W/O CHOL/HDL RATIO
Cholesterol, Total: 151 mg/dL (ref 100–199)
HDL: 37 mg/dL — ABNORMAL LOW (ref >39)
LDL CALC: 78 mg/dL (ref 0–99)
TRIGLYCERIDES: 180 mg/dL — AB (ref 0–149)
VLDL CHOLESTEROL CAL: 36 mg/dL (ref 5–40)

## 2017-06-08 LAB — COMPREHENSIVE METABOLIC PANEL
ALT: 14 IU/L (ref 0–32)
AST: 18 IU/L (ref 0–40)
Albumin/Globulin Ratio: 1.7 (ref 1.2–2.2)
Albumin: 3.9 g/dL (ref 3.5–4.7)
Alkaline Phosphatase: 76 IU/L (ref 39–117)
BUN / CREAT RATIO: 38 — AB (ref 12–28)
BUN: 27 mg/dL (ref 8–27)
Bilirubin Total: 0.3 mg/dL (ref 0.0–1.2)
CALCIUM: 9.6 mg/dL (ref 8.7–10.3)
CO2: 23 mmol/L (ref 20–29)
CREATININE: 0.72 mg/dL (ref 0.57–1.00)
Chloride: 100 mmol/L (ref 96–106)
GFR calc Af Amer: 88 mL/min/{1.73_m2} (ref >59)
GFR, EST NON AFRICAN AMERICAN: 77 mL/min/{1.73_m2} (ref >59)
GLOBULIN, TOTAL: 2.3 g/dL (ref 1.5–4.5)
GLUCOSE: 129 mg/dL — AB (ref 65–99)
Potassium: 4.3 mmol/L (ref 3.5–5.2)
SODIUM: 140 mmol/L (ref 134–144)
Total Protein: 6.2 g/dL (ref 6.0–8.5)

## 2017-06-08 LAB — AMBIG ABBREV LP DEFAULT

## 2017-06-08 LAB — AMBIG ABBREV CMP14 DEFAULT

## 2017-06-08 LAB — SPECIMEN STATUS REPORT

## 2017-06-21 ENCOUNTER — Other Ambulatory Visit: Payer: Self-pay | Admitting: Internal Medicine

## 2017-06-21 DIAGNOSIS — I10 Essential (primary) hypertension: Secondary | ICD-10-CM

## 2017-07-16 DIAGNOSIS — M810 Age-related osteoporosis without current pathological fracture: Secondary | ICD-10-CM | POA: Diagnosis not present

## 2017-07-16 LAB — HM DEXA SCAN

## 2017-07-27 ENCOUNTER — Encounter: Payer: Self-pay | Admitting: *Deleted

## 2017-08-03 ENCOUNTER — Telehealth: Payer: Self-pay

## 2017-08-03 NOTE — Telephone Encounter (Signed)
I left a message on voicemail of patient's daughter to call the office regarding bone density results.   Per Janett Billow, report shows patient has osteoporosis and Janett Billow is recommending that patient start prolia.   Pt T-score is: -2.6  A copy of report was placed in my box and original was sent to scan.

## 2017-08-03 NOTE — Telephone Encounter (Signed)
I spoke with patient's daughter, [name]. She asked that information on Prolia be sent to patient and she would discuss starting this medication with her. However, [name] doubted that patient would be willing to take it due to side effects.    Information mailed.

## 2017-08-05 NOTE — Progress Notes (Signed)
[place] OFFICE PROGRESS NOTE  Lauree Chandler, NP [phone] [place] [address]  DIAGNOSIS: Hereditary hemochromatosis ([place]) - Plan: US Abdomen Complete  CURRENT TREATMENT: PRN phlebotomy to keep ferritin <=100, and transferrin saturation less than 75%  INTERVAL HISTORY:  Denise Lane 83 y.o. female with a history of hemochromatosis is here for follow up. She was last seen by me 6 months ago. She presents to the clinic today reporting her husband had a stroke so she has to take care of him, she does not have much help. She came with her daughter and her husband.   She reports she was diagnosed with Osteoporosis by Dr. Dewaine Oats. She has been eating snacks since breakfast today and has not had much fluid intake. She feels well, but is fatigued from taking care of her husband. She has gotten her flu shot for this year.    MEDICAL HISTORY: Past Medical History:  Diagnosis Date  . Basal cell cancer    "face, legs" (10/18/2012)  . Bruises easily   . Chronic lower back pain   . Depression    takes Effexor daily  . Diverticulosis of sigmoid colon 11-20-2009  . Dry eyes    uses Refresh eye drops daily as needed  . Dysphagia   . Gastric ulcer   . GERD (gastroesophageal reflux disease)    takes Omeprazole daily  . H/O hiatal hernia   . Hemochromatosis 1987  . Hemochromatosis   . Hemorrhoids, internal 11-20-09  . History of blood transfusion    "S/P colonoscopy after polyp removed; got 2 units; esophageal bleed got 1 unit; really messed up hemochromatosis" (1/7/20214)  . History of bronchitis 1991-1996   "chronic; related to winter" (10/18/2012)  . History of colonic polyps    NOTED 11-20-09 IN COLONOSCOPY REPORT  . Hyperlipidemia    no meds required  . Hypertension    takes Metoprolol and Losartan daily  . Joint pain   . Joint swelling   . Muscle pain   . Muscle spasm    takes Robaxin daily as needed  . Occasional tremors   . Osteoarthritis   .  Osteoarthritis of right hip 10/18/2012  . Osteoporosis   . Overactive bladder    takes Myrbetriq daily  . PONV (postoperative nausea and vomiting)   . Rheumatic fever    hx of  . Squamous carcinoma    "LLE" (10/18/2012)  . Stenosis of esophagus   . Tremor, essential 10-29-09  . Urinary urgency    takes Ditropan daily  . Vomiting    occasionally    INTERIM HISTORY: has Hemochromatosis; Osteoarthritis of right hip; Muscle pain; Hyperlipidemia; Urinary incontinence; Back pain; Osteoarthritis; Neuropathy; Anxiety and depression; Osteoarthritis of left hip; Right thigh pain; Periprosthetic hip fracture; Hip fracture ([place]); Postoperative anemia due to acute blood loss; Leg cramps; and Morton's neuroma of left foot on her problem list.    ALLERGIES:  is allergic to codeine; dilaudid [hydromorphone hcl]; macrodantin; morphine and related; sulfa antibiotics; and ibuprofen.  MEDICATIONS: has a current medication list which includes the following prescription(s): acetaminophen, calcium citrate, carboxymethylcellulose, cholecalciferol, gabapentin, losartan, metoprolol tartrate, oxybutynin, ranitidine, and venlafaxine xr.  SURGICAL HISTORY:  Past Surgical History:  Procedure Laterality Date  . APPENDECTOMY  1952  . CATARACT EXTRACTION W/ INTRAOCULAR LENS  IMPLANT, BILATERAL  2001   "both eyes" (10/18/2012)  . CHOLECYSTECTOMY  1977  . COLONOSCOPY    . DILATION AND CURETTAGE OF UTERUS  1975  .  ESOPHAGOGASTRODUODENOSCOPY    . [place]  . SHOULDER ARTHROSCOPY W/ ROTATOR CUFF REPAIR  1998?   "left" (10/18/2012)  . SKIN CANCER EXCISION     "multiple" (10/18/2012)  . TONSILLECTOMY  1953  . TOTAL HIP ARTHROPLASTY  10/18/2012   "right" (10/18/2012)  . TOTAL HIP ARTHROPLASTY  10/18/2012   Procedure: TOTAL HIP ARTHROPLASTY;  Surgeon: Johnny Bridge, MD;  Location: [facility];  Service: Orthopedics;  Laterality: Right;  . TOTAL HIP ARTHROPLASTY Left 07/24/2014   dr Mardelle Matte  . TOTAL HIP ARTHROPLASTY  Left 07/24/2014   Procedure: LEFT TOTAL HIP ARTHROPLASTY;  Surgeon: Johnny Bridge, MD;  Location: [facility];  Service: Orthopedics;  Laterality: Left;  . TUBAL LIGATION  1975    REVIEW OF SYSTEMS:   Constitutional: Denies fevers, chills or abnormal weight loss (+) tiredness Eyes: Denies blurriness of vision Ears, nose, mouth, throat, and face: Denies mucositis or sore throat Respiratory: Denies cough, dyspnea or wheezes Cardiovascular: Denies palpitation, chest discomfort or lower extremity swelling Gastrointestinal:  Denies nausea, heartburn or change in bowel habits Skin: Denies abnormal skin rashes Lymphatics: Denies new lymphadenopathy or easy bruising Neurological:Denies numbness, tingling or new weaknesses Behavioral/Psych: Mood is stable, no new changes  All other systems were reviewed with the patient and are negative.  PHYSICAL EXAMINATION: ECOG PERFORMANCE STATUS: 1 - Symptomatic but completely ambulatory  Blood pressure (!) 132/49, pulse 61, temperature 97.8 F (36.6 C), temperature source Oral, resp. rate 20, height 5\' 6"  (1.676 m), weight 118 lb 11.2 oz (53.8 kg), SpO2 99 %.  GENERAL:alert, no distress and comfortable; elderly, thin and ambulates with a cane SKIN: skin color, texture, turgor are normal, no rashes or significant lesions EYES: normal, Conjunctiva are pink and non-injected, sclera clear OROPHARYNX:no exudate, no erythema and lips, buccal mucosa, and tongue normal  NECK: supple, thyroid normal size, non-tender, without nodularity LYMPH:  no palpable lymphadenopathy in the cervical, axillary or supraclavicular LUNGS: clear to auscultation with normal breathing effort, no wheezes or rhonchi HEART: regular rate & rhythm and no murmurs and no lower extremity edema ABDOMEN:abdomen soft, non-tender and normal bowel sounds Musculoskeletal:no cyanosis of digits and no clubbing; Stable arthritic changes to fingers bilaterally NEURO: alert & oriented x 3 with fluent  speech, no focal motor/sensory deficits  Labs:  CBC Latest Ref Rng & Units 08/06/2017 06/04/2017 04/09/2017  WBC 3.9 - 10.3 10e3/uL 7.0 7.0 7.0  Hemoglobin 11.6 - 15.9 g/dL 13.0 12.1 12.8  Hematocrit 34.8 - 46.6 % 38.9 36.2 37.8  Platelets 145 - 400 10e3/uL 206 269 247    CMP Latest Ref Rng & Units 06/04/2017 07/30/2016 02/05/2016  Glucose 65 - 99 mg/dL 129(H) - -  BUN 8 - 27 mg/dL 27 23(A) 27(A)  Creatinine 0.57 - 1.00 mg/dL 0.72 0.7 0.6  Sodium 134 - 144 mmol/L 140 141 140  Potassium 3.5 - 5.2 mmol/L 4.3 4.6 4.4  Chloride 96 - 106 mmol/L 100 - -  CO2 20 - 29 mmol/L 23 - -  Calcium 8.7 - 10.3 mg/dL 9.6 - -  Total Protein 6.0 - 8.5 g/dL 6.2 - -  Total Bilirubin 0.0 - 1.2 mg/dL 0.3 - -  Alkaline Phos 39 - 117 IU/L 76 81 -  AST 0 - 40 IU/L 18 19 -  ALT 0 - 32 IU/L 14 16 -    Results for ROREY, HODGES (MRN [id number]) as of 08/05/2017 18:43  Ref. Range 01/29/2017 11:44 04/09/2017 12:46 06/04/2017 12:43  Iron Latest Ref  Range: 41 - 142 ug/dL 204 (H) 179 (H) 171 (H)  UIBC Latest Ref Range: 120 - 384 ug/dL 16 (L) 43 (L) 42 (L)  TIBC Latest Ref Range: 236 - 444 ug/dL 220 (L) 221 (L) 213 (L)  %SAT Latest Ref Range: 21 - 57 % 93 (H) 81 (H) 80 (H)  Ferritin Latest Ref Range: 9 - 269 ng/ml 105 100 114  PENDING 08/06/17   RADIOGRAPHIC STUDIES: No results found.  ASSESSMENT: Denise Lane 83 y.o. female with a history of Hereditary hemochromatosis ([place]) - Plan: US Abdomen Complete   PLAN:  1.Hemachromatosis:  -She is clinically doing very well. -given her advanced age and symptomatic phlebotomy (presyncope), I will only do phlebotomize if her ferritin >100 or very high transferrin saturation.   -she has been tolerating Phlebotomy well lately, and does feel better overall after phlebotomy. -Her serum iron and transferrin saturation is remaining to be very high lately, I recommend her to repeat lab more frequent, to every 2 months. If her ferritin >100 or transferrin  saturation>75%, I will recommend phlebotomy, I'll do 0.75 unit with normal saline 250 mL infusion. Her iron study result is usually not available during her visit, and OK to do Phlebotomy based on her last lab results. -Her liver ultrasound on 10/2016 showed no liver or spleen abnormality. We'll repeat once a year. -Based on her previous lab, I recommend phlebotomy today. Today's panel is still pending.  -I would like to continue labs every 2 months and phlebotomy -She had her flu shot this year already  -F/u in 8 months with [place] of abdomen before    2. Osteoporosis -Followed by Dr. Dewaine Oats    PLAN -phlebotomy today, 3/4 of unit -Lab and phlebotomy every 2 months X4 fu in 8 months  -[place] of abdomen one week before next visit    All questions were answered. The patient knows to call the clinic with any problems, questions or concerns. We can certainly see the patient much sooner if necessary.  I spent 10 minutes counseling the patient face to face. The total time spent in the appointment was 15 minutes.    Truitt Merle, MD 08/08/2017   This document serves as a record of services personally performed by Truitt Merle, MD. It was created on her behalf by Joslyn Devon, a trained medical scribe. The creation of this record is based on the scribe's personal observations and the provider's statements to them. This document has been checked and approved by the attending provider.

## 2017-08-06 ENCOUNTER — Ambulatory Visit (HOSPITAL_BASED_OUTPATIENT_CLINIC_OR_DEPARTMENT_OTHER): Payer: Medicare HMO | Admitting: Hematology

## 2017-08-06 ENCOUNTER — Other Ambulatory Visit (HOSPITAL_BASED_OUTPATIENT_CLINIC_OR_DEPARTMENT_OTHER): Payer: Medicare HMO

## 2017-08-06 ENCOUNTER — Ambulatory Visit (HOSPITAL_BASED_OUTPATIENT_CLINIC_OR_DEPARTMENT_OTHER): Payer: Medicare HMO

## 2017-08-06 DIAGNOSIS — M818 Other osteoporosis without current pathological fracture: Secondary | ICD-10-CM | POA: Diagnosis not present

## 2017-08-06 LAB — CBC WITH DIFFERENTIAL/PLATELET
BASO%: 0.1 % (ref 0.0–2.0)
Basophils Absolute: 0 10*3/uL (ref 0.0–0.1)
EOS ABS: 0.1 10*3/uL (ref 0.0–0.5)
EOS%: 1.6 % (ref 0.0–7.0)
HEMATOCRIT: 38.9 % (ref 34.8–46.6)
HGB: 13 g/dL (ref 11.6–15.9)
LYMPH#: 2.3 10*3/uL (ref 0.9–3.3)
LYMPH%: 32.7 % (ref 14.0–49.7)
MCH: 33 pg (ref 25.1–34.0)
MCHC: 33.4 g/dL (ref 31.5–36.0)
MCV: 98.7 fL (ref 79.5–101.0)
MONO#: 0.7 10*3/uL (ref 0.1–0.9)
MONO%: 9.9 % (ref 0.0–14.0)
NEUT%: 55.7 % (ref 38.4–76.8)
NEUTROS ABS: 3.9 10*3/uL (ref 1.5–6.5)
NRBC: 0 % (ref 0–0)
PLATELETS: 206 10*3/uL (ref 145–400)
RBC: 3.94 10*6/uL (ref 3.70–5.45)
RDW: 11.8 % (ref 11.2–14.5)
WBC: 7 10*3/uL (ref 3.9–10.3)

## 2017-08-06 LAB — IRON AND TIBC
%SAT: 45 % (ref 21–57)
Iron: 108 ug/dL (ref 41–142)
TIBC: 241 ug/dL (ref 236–444)
UIBC: 133 ug/dL (ref 120–384)

## 2017-08-06 LAB — FERRITIN: Ferritin: 95 ng/ml (ref 9–269)

## 2017-08-06 MED ORDER — SODIUM CHLORIDE 0.9 % IV SOLN
Freq: Once | INTRAVENOUS | Status: AC
  Administered 2017-08-06: 14:00:00 via INTRAVENOUS

## 2017-08-06 NOTE — Patient Instructions (Signed)

## 2017-08-06 NOTE — Progress Notes (Signed)
Therapeutic phlebotomy performed using 20g IV catheter in the L AC over 15 minutes. 500g of blood removed. Patient is receiving 250 of NS and eating snacks/drink.

## 2017-08-08 ENCOUNTER — Encounter: Payer: Self-pay | Admitting: Hematology

## 2017-08-09 ENCOUNTER — Telehealth: Payer: Self-pay | Admitting: *Deleted

## 2017-08-09 NOTE — Telephone Encounter (Signed)
-----   Message from Truitt Merle, MD sent at 08/09/2017  8:11 AM EDT ----- Please let pt know the lab results. Based on this, she probably will not get phlebotomy on her next lab in 2 months (please cancel, but keep lab appointment. We are doing phlebotomy based on last lab results if same day result is not available)  Thanks  Truitt Merle  08/09/2017

## 2017-08-09 NOTE — Telephone Encounter (Signed)
TCT patient's home. Spoke with pt's daughter, [name].  Advised her that her labs from this past Friday were acceptable and that likely pt won't need a phlebotomy at her next appt in 2 months. She will have labs only.  No lab appt scheduled yet. Advised daughter that scheduling would be in contact with her with that appt. No further questions and concerns at this time.

## 2017-08-10 ENCOUNTER — Telehealth: Payer: Self-pay | Admitting: Hematology

## 2017-08-10 NOTE — Telephone Encounter (Signed)
Sent message for schedulers to schedule 10/26 LOS.

## 2017-08-10 NOTE — Telephone Encounter (Signed)
Patient aware of appts scheduled per 10/26 los.

## 2017-09-09 DIAGNOSIS — Z85828 Personal history of other malignant neoplasm of skin: Secondary | ICD-10-CM | POA: Diagnosis not present

## 2017-09-09 DIAGNOSIS — D225 Melanocytic nevi of trunk: Secondary | ICD-10-CM | POA: Diagnosis not present

## 2017-09-09 DIAGNOSIS — L570 Actinic keratosis: Secondary | ICD-10-CM | POA: Diagnosis not present

## 2017-09-09 DIAGNOSIS — L821 Other seborrheic keratosis: Secondary | ICD-10-CM | POA: Diagnosis not present

## 2017-09-09 DIAGNOSIS — L814 Other melanin hyperpigmentation: Secondary | ICD-10-CM | POA: Diagnosis not present

## 2017-09-09 DIAGNOSIS — D1801 Hemangioma of skin and subcutaneous tissue: Secondary | ICD-10-CM | POA: Diagnosis not present

## 2017-10-06 ENCOUNTER — Other Ambulatory Visit: Payer: Medicare HMO

## 2017-10-08 ENCOUNTER — Other Ambulatory Visit (HOSPITAL_BASED_OUTPATIENT_CLINIC_OR_DEPARTMENT_OTHER): Payer: Medicare HMO

## 2017-10-08 LAB — CBC WITH DIFFERENTIAL/PLATELET
BASO%: 0.3 % (ref 0.0–2.0)
BASOS ABS: 0 10*3/uL (ref 0.0–0.1)
EOS ABS: 0.1 10*3/uL (ref 0.0–0.5)
EOS%: 1.8 % (ref 0.0–7.0)
HEMATOCRIT: 37.9 % (ref 34.8–46.6)
HEMOGLOBIN: 12.5 g/dL (ref 11.6–15.9)
LYMPH#: 2.7 10*3/uL (ref 0.9–3.3)
LYMPH%: 34.4 % (ref 14.0–49.7)
MCH: 32.7 pg (ref 25.1–34.0)
MCHC: 33 g/dL (ref 31.5–36.0)
MCV: 99.2 fL (ref 79.5–101.0)
MONO#: 0.6 10*3/uL (ref 0.1–0.9)
MONO%: 7.7 % (ref 0.0–14.0)
NEUT#: 4.3 10*3/uL (ref 1.5–6.5)
NEUT%: 55.8 % (ref 38.4–76.8)
Platelets: 222 10*3/uL (ref 145–400)
RBC: 3.82 10*6/uL (ref 3.70–5.45)
RDW: 12.5 % (ref 11.2–14.5)
WBC: 7.8 10*3/uL (ref 3.9–10.3)

## 2017-10-08 LAB — IRON AND TIBC
%SAT: 87 % — ABNORMAL HIGH (ref 21–57)
Iron: 192 ug/dL — ABNORMAL HIGH (ref 41–142)
TIBC: 220 ug/dL — ABNORMAL LOW (ref 236–444)
UIBC: 28 ug/dL — AB (ref 120–384)

## 2017-10-08 LAB — FERRITIN: Ferritin: 55 ng/ml (ref 9–269)

## 2017-10-11 ENCOUNTER — Telehealth: Payer: Self-pay | Admitting: *Deleted

## 2017-10-11 NOTE — Telephone Encounter (Signed)
TCT to patient's daughter, Denise Lane regarding pt's lab results from 10/08/17. Iron level is elevated and Dr. Burr Medico has requested phlebotomy in the next 1-2 weeks. [name] voiced understanding but is requesting that she be called regarding appointment date and time due to her work schedule.  No other questions or concerns.

## 2017-10-11 NOTE — Telephone Encounter (Signed)
-----   Message from Truitt Merle, MD sent at 10/09/2017 11:44 AM EST ----- Please let pt's daughter know her lab result, I will schedule her phlebotomy in 1-2 weeks   Truitt Merle  10/09/2017

## 2017-10-14 ENCOUNTER — Telehealth: Payer: Self-pay | Admitting: Hematology

## 2017-10-14 NOTE — Telephone Encounter (Signed)
Scheduled appt per 12/31 sch message - patient unable to come in in two weeks due to transportation - patient scheduled in for 1/11.

## 2017-10-22 ENCOUNTER — Inpatient Hospital Stay: Payer: Medicare HMO | Attending: Hematology

## 2017-10-22 MED ORDER — SODIUM CHLORIDE 0.9 % IV SOLN
Freq: Once | INTRAVENOUS | Status: AC
  Administered 2017-10-22: 15:00:00 via INTRAVENOUS

## 2017-10-22 NOTE — Patient Instructions (Signed)

## 2017-11-06 ENCOUNTER — Other Ambulatory Visit: Payer: Self-pay | Admitting: Internal Medicine

## 2017-11-29 ENCOUNTER — Encounter: Payer: Self-pay | Admitting: Hematology

## 2017-12-02 ENCOUNTER — Ambulatory Visit (INDEPENDENT_AMBULATORY_CARE_PROVIDER_SITE_OTHER): Payer: Medicare HMO | Admitting: Nurse Practitioner

## 2017-12-02 ENCOUNTER — Encounter: Payer: Self-pay | Admitting: Nurse Practitioner

## 2017-12-02 VITALS — BP 128/62 | HR 66 | Temp 98.0°F | Ht 66.0 in | Wt 118.0 lb

## 2017-12-02 DIAGNOSIS — K219 Gastro-esophageal reflux disease without esophagitis: Secondary | ICD-10-CM | POA: Diagnosis not present

## 2017-12-02 DIAGNOSIS — M810 Age-related osteoporosis without current pathological fracture: Secondary | ICD-10-CM

## 2017-12-02 DIAGNOSIS — F419 Anxiety disorder, unspecified: Secondary | ICD-10-CM | POA: Diagnosis not present

## 2017-12-02 DIAGNOSIS — N3281 Overactive bladder: Secondary | ICD-10-CM

## 2017-12-02 DIAGNOSIS — M1990 Unspecified osteoarthritis, unspecified site: Secondary | ICD-10-CM

## 2017-12-02 DIAGNOSIS — G629 Polyneuropathy, unspecified: Secondary | ICD-10-CM | POA: Diagnosis not present

## 2017-12-02 DIAGNOSIS — I10 Essential (primary) hypertension: Secondary | ICD-10-CM | POA: Diagnosis not present

## 2017-12-02 DIAGNOSIS — F329 Major depressive disorder, single episode, unspecified: Secondary | ICD-10-CM

## 2017-12-02 MED ORDER — PANTOPRAZOLE SODIUM 40 MG PO TBEC: 40.0000 mg | DELAYED_RELEASE_TABLET | Freq: Every day | ORAL | 3 refills | Status: DC

## 2017-12-02 NOTE — Progress Notes (Signed)
Careteam: Patient Care Team: Lauree Chandler, NP as PCP - General (Geriatric Medicine)  Advanced Directive information    Allergies  Allergen Reactions  . Codeine Nausea And Vomiting  . Dilaudid [Hydromorphone Hcl] Hives and Other (See Comments)    "whelps" (10/18/2012)  . Macrodantin Hives and Other (See Comments)    "drug fever; chills; felt terrible" (10/18/2012)  . Morphine And Related Hives and Nausea And Vomiting  . Sulfa Antibiotics Hives, Rash and Other (See Comments)    "got real sick" (10/18/2012)  . Ibuprofen Other (See Comments)    "felt like I have to go to the bathroom constantly"    Chief Complaint  Patient presents with  . Medical Management of Chronic Issues    Pt is being seen for a 6 month routine visit.  . Depression/Fall screenings    pt has had 1 fall 3 weeks ago/ score of 0 for depression     HPI: Patient is a 85 y.o. female seen in the office today for routine follow up.  More fatigued due to husband with dementia. Keeping her busy Had a fall about 3 weeks ago. Did not tell anyone but her buttocks was sore, improving now after 3-4 weeks.   Hemochromatosis- continues to follow up with oncology and getting phlebotomy.   Osteoporosis noted on last dexa scan, prolia was recommended but daughter stated due side effects she doubted her mother would take medication- pt reports she would be willing if she could afford it. Continues to take calcium and vit D  Daughter does not wish for her to take due to hx of taking other medication for OP and reports she had bone loss in her jaw, also states she is worried about her developing anemia with her hx of hemochromatosis    Hypertension- controlled on current regimen   OA-report she hurts all over. Taking tylenol 650 mg BID, which is helping some. "Does not stop it all".  GERD- doing well on Protonix, not having any complaints.   Anxiety- controlled on effexor  Neuropathy- had stopped gabapentin for a  while but due to pain getting worse she has now gotten back on this.   Overactive bladder- stable on oxybutynin, reports she does better on this than myrbetriq   Review of Systems:  Review of Systems  Constitutional: Positive for malaise/fatigue. Negative for chills, fever and weight loss.  HENT: Positive for hearing loss. Negative for tinnitus.   Respiratory: Negative for cough, sputum production and shortness of breath.   Cardiovascular: Negative for chest pain, palpitations and leg swelling.  Gastrointestinal: Negative for abdominal pain, constipation, diarrhea and heartburn.  Genitourinary: Positive for frequency. Negative for dysuria and urgency.  Musculoskeletal: Positive for back pain. Negative for falls, joint pain and myalgias.  Skin: Negative.   Neurological: Positive for tingling and sensory change. Negative for dizziness and headaches.  Psychiatric/Behavioral: Negative for depression and memory loss. The patient is nervous/anxious. The patient does not have insomnia.        Not sleeping due to husband with dementia    Past Medical History:  Diagnosis Date  . Basal cell cancer    "face, legs" (10/18/2012)  . Bruises easily   . Chronic lower back pain   . Depression    takes Effexor daily  . Diverticulosis of sigmoid colon 11-20-2009  . Dry eyes    uses Refresh eye drops daily as needed  . Dysphagia   . Gastric ulcer   . GERD (gastroesophageal reflux  disease)    takes Omeprazole daily  . H/O hiatal hernia   . Hemochromatosis 1987  . Hemochromatosis   . Hemorrhoids, internal 11-20-09  . History of blood transfusion    "S/P colonoscopy after polyp removed; got 2 units; esophageal bleed got 1 unit; really messed up hemochromatosis" (1/7/20214)  . History of bronchitis 1991-1996   "chronic; related to winter" (10/18/2012)  . History of colonic polyps    NOTED 11-20-09 IN COLONOSCOPY REPORT  . Hyperlipidemia    no meds required  . Hypertension    takes Metoprolol and  Losartan daily  . Joint pain   . Joint swelling   . Muscle pain   . Muscle spasm    takes Robaxin daily as needed  . Occasional tremors   . Osteoarthritis   . Osteoarthritis of right hip 10/18/2012  . Osteoporosis   . Overactive bladder    takes Myrbetriq daily  . PONV (postoperative nausea and vomiting)   . Rheumatic fever    hx of  . Squamous carcinoma    "LLE" (10/18/2012)  . Stenosis of esophagus   . Tremor, essential 10-29-09  . Urinary urgency    takes Ditropan daily  . Vomiting    occasionally   Past Surgical History:  Procedure Laterality Date  . APPENDECTOMY  1952  . CATARACT EXTRACTION W/ INTRAOCULAR LENS  IMPLANT, BILATERAL  2001   "both eyes" (10/18/2012)  . CHOLECYSTECTOMY  1977  . COLONOSCOPY    . DILATION AND CURETTAGE OF UTERUS  1975  . ESOPHAGOGASTRODUODENOSCOPY    . [place]  . SHOULDER ARTHROSCOPY W/ ROTATOR CUFF REPAIR  1998?   "left" (10/18/2012)  . SKIN CANCER EXCISION     "multiple" (10/18/2012)  . TONSILLECTOMY  1953  . TOTAL HIP ARTHROPLASTY  10/18/2012   "right" (10/18/2012)  . TOTAL HIP ARTHROPLASTY  10/18/2012   Procedure: TOTAL HIP ARTHROPLASTY;  Surgeon: Johnny Bridge, MD;  Location: [facility];  Service: Orthopedics;  Laterality: Right;  . TOTAL HIP ARTHROPLASTY Left 07/24/2014   dr Mardelle Matte  . TOTAL HIP ARTHROPLASTY Left 07/24/2014   Procedure: LEFT TOTAL HIP ARTHROPLASTY;  Surgeon: Johnny Bridge, MD;  Location: [place];  Service: Orthopedics;  Laterality: Left;  . TUBAL LIGATION  1975   Social History:   reports that  has never smoked. she has never used smokeless tobacco. She reports that she does not drink alcohol or use drugs.  Family History  Problem Relation Age of Onset  . Heart disease Mother   . Heart disease Father   . Hyperlipidemia Daughter   . Heart disease Brother   . Heart disease Brother   . Heart disease Brother   . Heart disease Brother   . Cancer Sister   . Melanoma Sister   . Alzheimer's disease Sister   .  Heart disease Sister   . Osteoporosis Sister     Medications: Patient's Medications  New Prescriptions   No medications on file  Previous Medications   ACETAMINOPHEN (TYLENOL) 650 MG CR TABLET    Take 650 mg by mouth 2 (two) times daily.    CALCIUM CITRATE (CALCITRATE - DOSED IN MG ELEMENTAL CALCIUM) 950 MG TABLET    Take 1-2 tablets by mouth 3 (three) times daily. 2 tablets in am and 1 tablet with lunch and supper   CARBOXYMETHYLCELLULOSE (REFRESH PLUS) 0.5 % SOLN    Place 1 drop into both eyes 3 (three) times daily as needed. For dry eyes  CHOLECALCIFEROL (VITAMIN D) 1000 UNITS TABLET    Take 1,000 Units by mouth 2 (two) times daily.   KRILL OIL 350 MG CAPS    Take 1 capsule by mouth daily.   LOSARTAN (COZAAR) 50 MG TABLET    TAKE 1 TABLET EVERY DAY   METOPROLOL TARTRATE (LOPRESSOR) 25 MG TABLET    TAKE 1 TABLET EVERY DAY   OXYBUTYNIN (DITROPAN) 5 MG TABLET    TAKE 1 TABLET TWICE DAILY   PANTOPRAZOLE (PROTONIX) 40 MG TABLET    Take 1 tablet (40 mg total) by mouth daily.   VENLAFAXINE XR (EFFEXOR-XR) 37.5 MG 24 HR CAPSULE    TAKE 1 CAPSULE EVERY DAY WITH BREAKFAST  Modified Medications   No medications on file  Discontinued Medications   RANITIDINE (ZANTAC) 150 MG TABLET    Take 150 mg by mouth daily.     Physical Exam:  Vitals:   12/02/17 1443  BP: 128/62  Pulse: 66  Temp: 98 F (36.7 C)  SpO2: 96%  Weight: 118 lb (53.5 kg)  Height: 5\' 6"  (1.676 m)   Body mass index is 19.05 kg/m.  Physical Exam  Constitutional: She is oriented to person, place, and time.  Pleasant, frail, NAD  HENT:  Head: Normocephalic and atraumatic.  Nose: Nose normal.  Mouth/Throat: Oropharynx is clear and moist.  Eyes: Conjunctivae and EOM are normal. Pupils are equal, round, and reactive to light.  Neck: Normal range of motion. Neck supple. No JVD present.  Cardiovascular: Normal rate, regular rhythm and normal heart sounds.  No murmur heard. Pulmonary/Chest: Effort normal and breath  sounds normal. No respiratory distress.  Abdominal: Soft. Bowel sounds are normal. She exhibits no distension. There is no tenderness.  Musculoskeletal: Normal range of motion. She exhibits no edema or tenderness.  Neurological: She is alert and oriented to person, place, and time. She has normal reflexes.  Skin: Skin is warm and dry. No rash noted. No erythema.  Scattered senile purpura  Psychiatric: She has a normal mood and affect. Her behavior is normal. Judgment and thought content normal.    Labs reviewed: Basic Metabolic Panel: Recent Labs    06/04/17 0000  NA 140  K 4.3  CL 100  CO2 23  GLUCOSE 129*  BUN 27  CREATININE 0.72  CALCIUM 9.6   Liver Function Tests: Recent Labs    06/04/17 0000  AST 18  ALT 14  ALKPHOS 76  BILITOT 0.3  PROT 6.2  ALBUMIN 3.9   No results for input(s): LIPASE, AMYLASE in the last 8760 hours. No results for input(s): AMMONIA in the last 8760 hours. CBC: Recent Labs    06/04/17 1243 08/06/17 1314 10/08/17 1353  WBC 7.0 7.0 7.8  NEUTROABS 3.8 3.9 4.3  HGB 12.1 13.0 12.5  HCT 36.2 38.9 37.9  MCV 98.7 98.7 99.2  PLT 269 206 222   Lipid Panel: Recent Labs    06/04/17 0000  CHOL 151  HDL 37*  LDLCALC 78  TRIG 180*   TSH: No results for input(s): TSH in the last 8760 hours. A1C: No results found for: HGBA1C   Assessment/Plan 1. Neuropathy -has restarted her gabapentin which is helping pain. Plans to call us so we can updated medication list.   2. Osteoarthritis, unspecified osteoarthritis type, unspecified site Stable on acetaminophen 650 mg CR twice daily  3. Anxiety and depression -stable on effexor daily, will cont current regimen. 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 Hereditary hemochromatosis ([place]) Following with hematology.   5. Essential hypertension -stable on current regimen, will get blood work on hematology when she is  there next. Will cont current regimen  6. Gastroesophageal reflux disease without esophagitis Controlled on protonix - pantoprazole (PROTONIX) 40 MG tablet; Take 1 tablet (40 mg total) by mouth daily.  Dispense: 90 tablet; Refill: 3  7. Osteoporosis Continues on calcium and vit D -daughter does not wish for her to take prolia at this time due to her other co-morbidies and feels like she is at risk for complications. Plans to discuss with hematologist as well.   8. Overactive bladder -stable on oxybutynin   Next appt: 6 month follow up.  Carlos American. Harle Battiest  [facility] [phone] 8 am - 5 pm) [phone] (after hours)

## 2017-12-06 ENCOUNTER — Telehealth: Payer: Self-pay | Admitting: Hematology

## 2017-12-06 NOTE — Telephone Encounter (Signed)
Appointments rescheduled and daughter notified per 2/25 sch msg

## 2017-12-08 ENCOUNTER — Other Ambulatory Visit: Payer: Medicare HMO

## 2017-12-14 ENCOUNTER — Encounter: Payer: Self-pay | Admitting: Nurse Practitioner

## 2018-01-05 ENCOUNTER — Inpatient Hospital Stay: Payer: Medicare HMO

## 2018-01-05 ENCOUNTER — Inpatient Hospital Stay: Payer: Medicare HMO | Attending: Hematology

## 2018-01-05 DIAGNOSIS — I10 Essential (primary) hypertension: Secondary | ICD-10-CM | POA: Diagnosis not present

## 2018-01-05 LAB — CBC WITH DIFFERENTIAL/PLATELET
BASOS PCT: 1 %
Basophils Absolute: 0 10*3/uL (ref 0.0–0.1)
EOS ABS: 0.1 10*3/uL (ref 0.0–0.5)
EOS PCT: 2 %
HCT: 36.2 % (ref 34.8–46.6)
HEMOGLOBIN: 12.3 g/dL (ref 11.6–15.9)
LYMPHS ABS: 2.3 10*3/uL (ref 0.9–3.3)
Lymphocytes Relative: 32 %
MCH: 33.1 pg (ref 25.1–34.0)
MCHC: 34.1 g/dL (ref 31.5–36.0)
MCV: 97.1 fL (ref 79.5–101.0)
MONO ABS: 0.7 10*3/uL (ref 0.1–0.9)
MONOS PCT: 9 %
NEUTROS PCT: 56 %
Neutro Abs: 4.1 10*3/uL (ref 1.5–6.5)
Platelets: 256 10*3/uL (ref 145–400)
RBC: 3.73 MIL/uL (ref 3.70–5.45)
RDW: 12.5 % (ref 11.2–14.5)
WBC: 7.3 10*3/uL (ref 3.9–10.3)

## 2018-01-05 LAB — BASIC METABOLIC PANEL
BUN: 26 — AB (ref 4–21)
CREATININE: 0.6 (ref 0.5–1.1)
Glucose: 91
POTASSIUM: 4.4 (ref 3.4–5.3)
Sodium: 141 (ref 137–147)

## 2018-01-05 LAB — IRON AND TIBC
IRON: 171 ug/dL — AB (ref 41–142)
Saturation Ratios: 74 % — ABNORMAL HIGH (ref 21–57)
TIBC: 233 ug/dL — AB (ref 236–444)
UIBC: 62 ug/dL

## 2018-01-05 LAB — HEPATIC FUNCTION PANEL
ALK PHOS: 84 (ref 25–125)
ALT: 11 (ref 7–35)
AST: 14 (ref 13–35)
Bilirubin, Total: 0.4

## 2018-01-05 LAB — FERRITIN: FERRITIN: 60 ng/mL (ref 9–269)

## 2018-01-05 NOTE — Progress Notes (Signed)
Spoke with Dr. Burr Medico regarding patient's labs. States patient does not require a phlebotomy today. Will f/u with patient at next appt. Patient aware and states she will call clinic if problems arise.

## 2018-01-12 ENCOUNTER — Encounter: Payer: Self-pay | Admitting: *Deleted

## 2018-01-17 IMAGING — US US ABDOMEN COMPLETE
1 series · 14 of 25 positions shown · non-contrast
Comparison: Abdominal ultrasound August 19, 2015

CLINICAL DATA: Hemochromatosis.  Evaluate liver for cirrhosis.

EXAM:
ABDOMEN ULTRASOUND COMPLETE

[Series 1: us abdomen complete · 0.19mm/px · 14 of 81 slices shown]
[im 1/81]
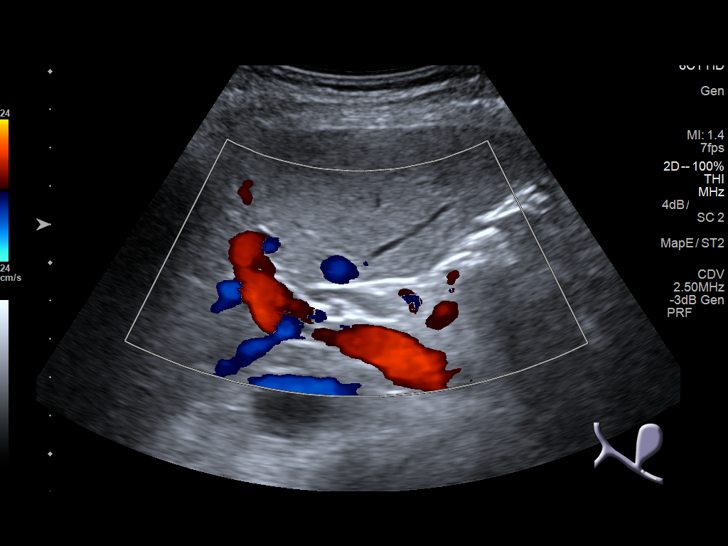
[im 7/81]
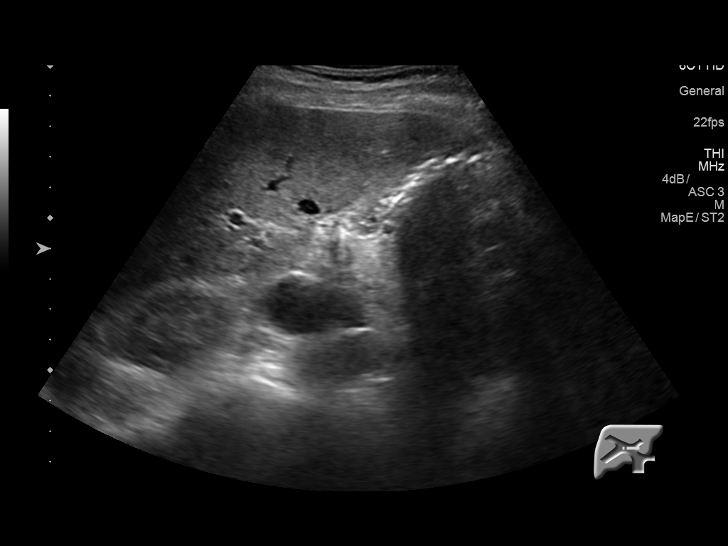
[im 14/81]
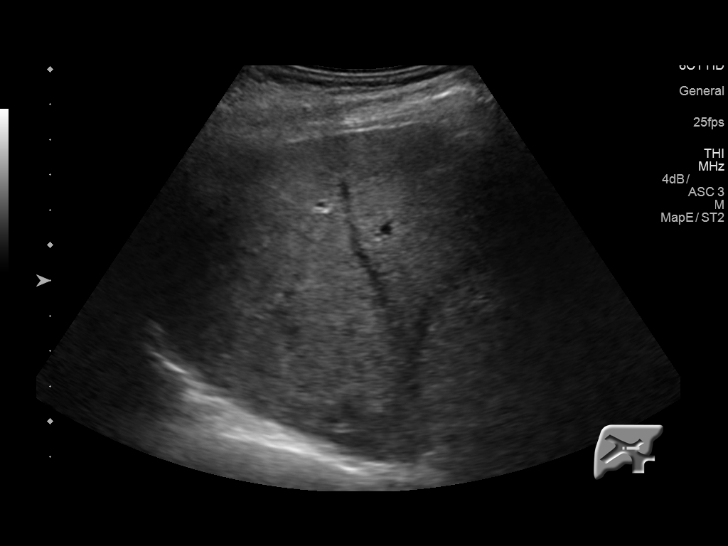
[im 21/81]
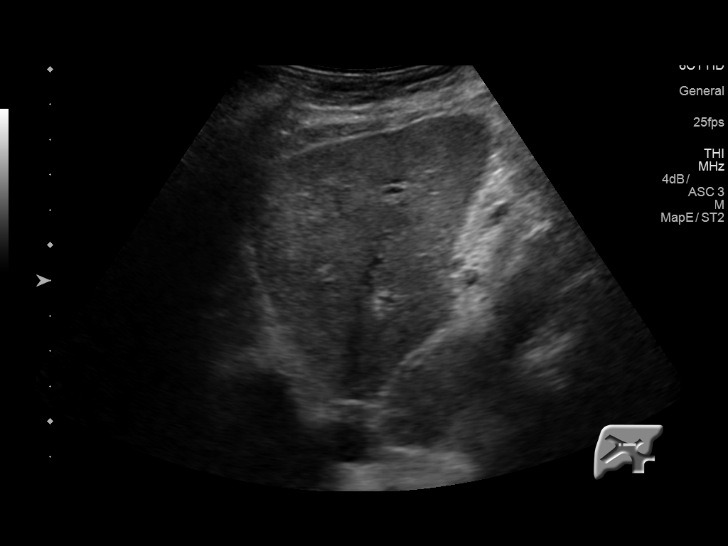
[im 27/81]
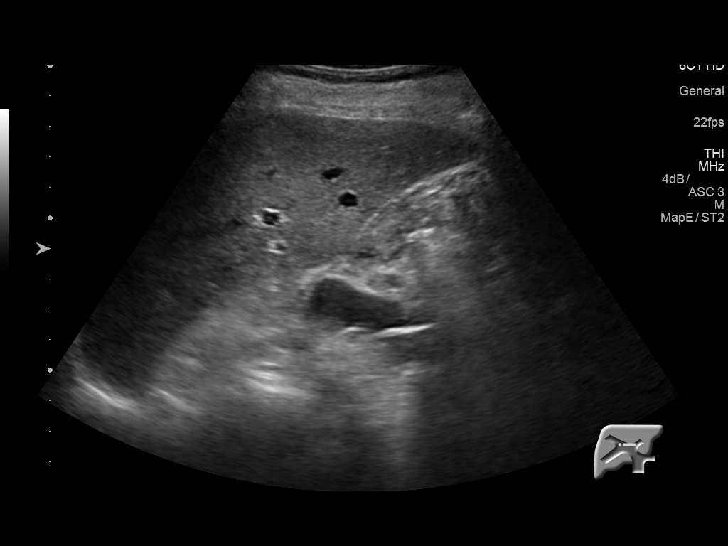
[im 31/81]
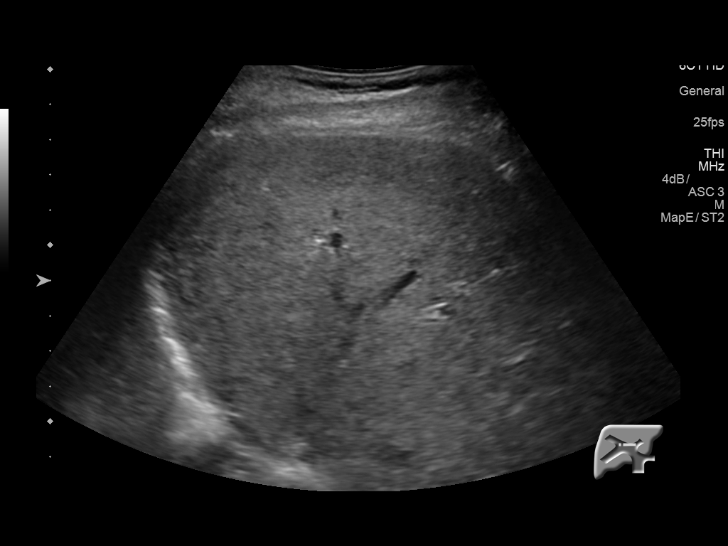
[im 37/81]
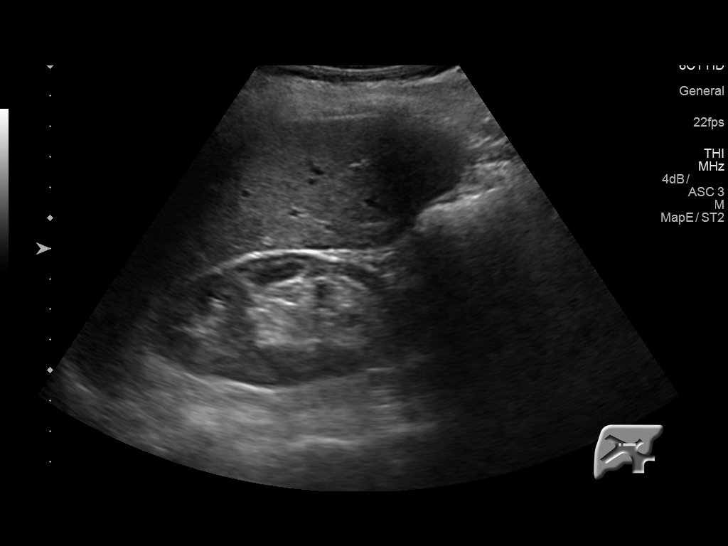
[im 44/81]
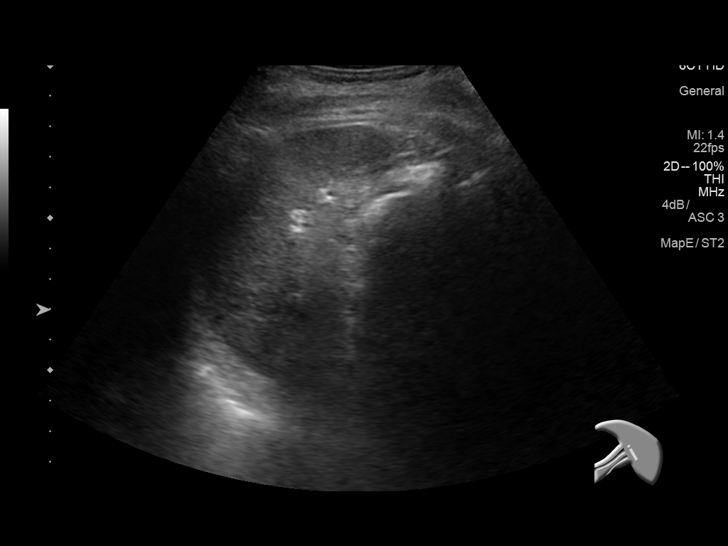
[im 51/81]
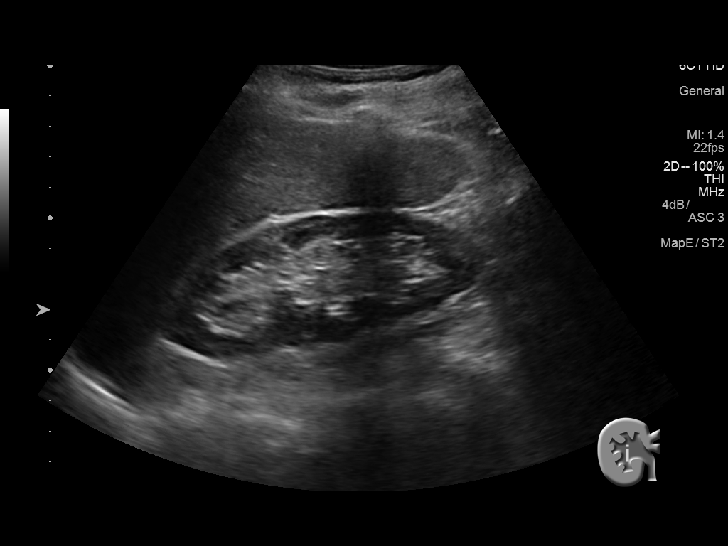
[im 54/81]
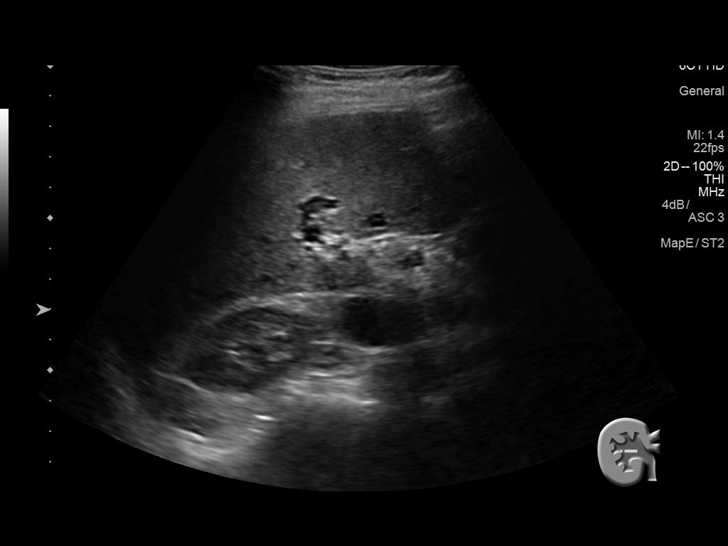
[im 61/81]
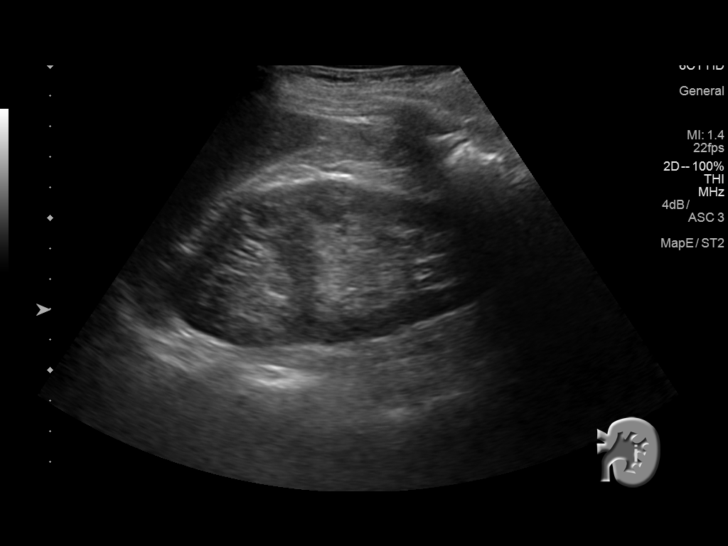
[im 67/81]
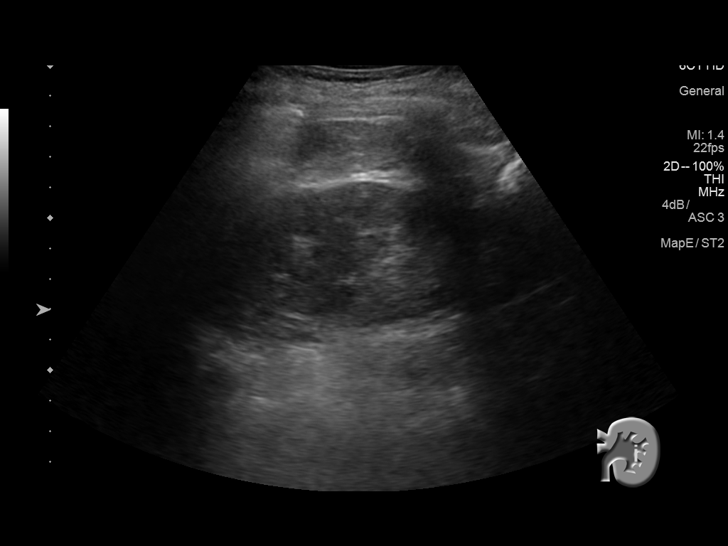
[im 74/81]
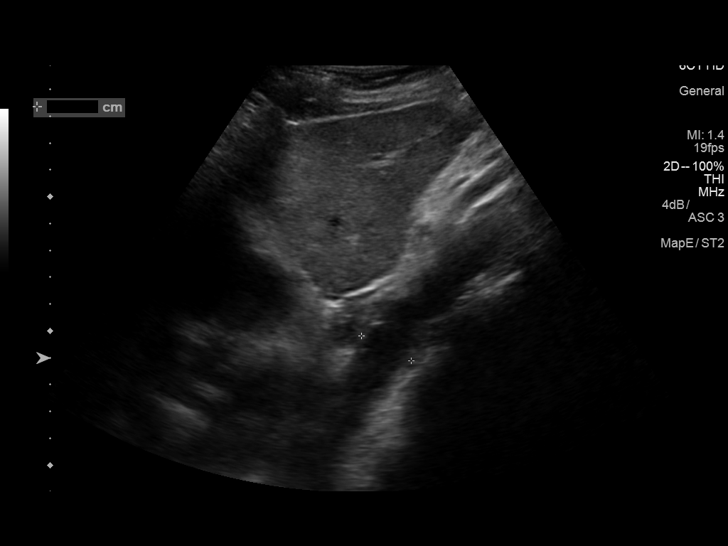
[im 81/81]
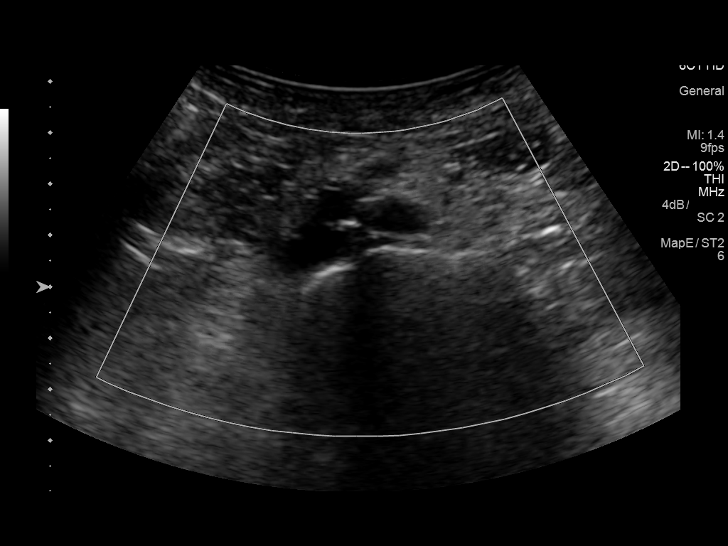

[14 of 25 positions shown; findings below may reference images not displayed]

FINDINGS: Gallbladder: The gallbladder is surgically absent.

Common bile duct: Diameter: 2.8 mm

Liver: No focal lesion identified. Within normal limits in
parenchymal echogenicity. No intrahepatic ductal dilation. The
surface contour is normal.

IVC: No abnormality visualized.

Pancreas: Visualized portion unremarkable.

Spleen: Size and appearance within normal limits.

Right Kidney: Length: 11.2 cm. Echogenicity within normal limits. No
mass or hydronephrosis visualized.

Left Kidney: Length: 11.4 cm. Echogenicity within normal limits. No
mass or hydronephrosis visualized.

Abdominal aorta: No aneurysm visualized.

Other findings: There is no ascites.
IMPRESSION: Normal appearance of the liver and spleen. The gallbladder is
surgically absent. No acute intra-abdominal abnormality is observed.

## 2018-02-01 ENCOUNTER — Other Ambulatory Visit: Payer: Self-pay | Admitting: Internal Medicine

## 2018-02-01 DIAGNOSIS — I10 Essential (primary) hypertension: Secondary | ICD-10-CM

## 2018-02-04 ENCOUNTER — Other Ambulatory Visit: Payer: Medicare HMO

## 2018-02-12 ENCOUNTER — Other Ambulatory Visit: Payer: Self-pay | Admitting: Internal Medicine

## 2018-02-12 DIAGNOSIS — F329 Major depressive disorder, single episode, unspecified: Secondary | ICD-10-CM

## 2018-02-12 DIAGNOSIS — F419 Anxiety disorder, unspecified: Principal | ICD-10-CM

## 2018-03-02 ENCOUNTER — Inpatient Hospital Stay: Payer: Medicare HMO

## 2018-03-02 ENCOUNTER — Other Ambulatory Visit: Payer: Medicare HMO

## 2018-03-02 ENCOUNTER — Ambulatory Visit: Payer: Medicare HMO | Admitting: Hematology

## 2018-03-02 ENCOUNTER — Inpatient Hospital Stay: Payer: Medicare HMO | Admitting: Hematology

## 2018-03-09 ENCOUNTER — Telehealth: Payer: Self-pay | Admitting: Hematology

## 2018-03-09 NOTE — Telephone Encounter (Signed)
Spoke with patients daughter regarding rescheduling appt.  She stated that her father has just passed away last night and did not want to reschedule at this time.  In basket message to Dr Burr Medico as well

## 2018-03-15 ENCOUNTER — Telehealth: Payer: Self-pay

## 2018-03-15 NOTE — Telephone Encounter (Signed)
Patient's daughter [name]) called in reference to when she needs to be seen by Dr. Burr Medico.  Had to cancel last appointments due to her father passing away.  Please advise.  [name] [phone]

## 2018-03-15 NOTE — Telephone Encounter (Signed)
Please let her schedule lab, f/u with Dr. Burr Medico, and phlebotomy to be done this month at her convenience.  Thanks, Regan Rakers NP

## 2018-03-17 ENCOUNTER — Telehealth: Payer: Self-pay | Admitting: Hematology

## 2018-03-17 ENCOUNTER — Other Ambulatory Visit: Payer: Medicare HMO

## 2018-03-17 ENCOUNTER — Encounter: Payer: Self-pay | Admitting: Hematology

## 2018-03-17 NOTE — Telephone Encounter (Signed)
Called pt re appts - spoke and confirmed

## 2018-03-18 ENCOUNTER — Ambulatory Visit: Payer: Medicare HMO | Admitting: Hematology

## 2018-03-24 ENCOUNTER — Inpatient Hospital Stay: Payer: Medicare HMO

## 2018-03-24 ENCOUNTER — Inpatient Hospital Stay: Payer: Medicare HMO | Attending: Hematology | Admitting: Hematology

## 2018-03-24 ENCOUNTER — Other Ambulatory Visit: Payer: Medicare HMO

## 2018-03-24 ENCOUNTER — Telehealth: Payer: Self-pay

## 2018-03-24 ENCOUNTER — Encounter: Payer: Self-pay | Admitting: Hematology

## 2018-03-24 ENCOUNTER — Telehealth: Payer: Self-pay | Admitting: Hematology

## 2018-03-24 DIAGNOSIS — F418 Other specified anxiety disorders: Secondary | ICD-10-CM | POA: Insufficient documentation

## 2018-03-24 DIAGNOSIS — Z8673 Personal history of transient ischemic attack (TIA), and cerebral infarction without residual deficits: Secondary | ICD-10-CM | POA: Diagnosis not present

## 2018-03-24 DIAGNOSIS — Z85828 Personal history of other malignant neoplasm of skin: Secondary | ICD-10-CM

## 2018-03-24 DIAGNOSIS — I10 Essential (primary) hypertension: Secondary | ICD-10-CM | POA: Diagnosis not present

## 2018-03-24 DIAGNOSIS — R5383 Other fatigue: Secondary | ICD-10-CM | POA: Diagnosis not present

## 2018-03-24 DIAGNOSIS — K219 Gastro-esophageal reflux disease without esophagitis: Secondary | ICD-10-CM | POA: Insufficient documentation

## 2018-03-24 DIAGNOSIS — M810 Age-related osteoporosis without current pathological fracture: Secondary | ICD-10-CM | POA: Diagnosis not present

## 2018-03-24 DIAGNOSIS — E785 Hyperlipidemia, unspecified: Secondary | ICD-10-CM | POA: Diagnosis not present

## 2018-03-24 DIAGNOSIS — Z79899 Other long term (current) drug therapy: Secondary | ICD-10-CM | POA: Insufficient documentation

## 2018-03-24 DIAGNOSIS — M1990 Unspecified osteoarthritis, unspecified site: Secondary | ICD-10-CM | POA: Diagnosis not present

## 2018-03-24 LAB — CBC WITH DIFFERENTIAL/PLATELET
Basophils Absolute: 0 10*3/uL (ref 0.0–0.1)
Basophils Relative: 0 %
Eosinophils Absolute: 0.1 10*3/uL (ref 0.0–0.5)
Eosinophils Relative: 2 %
HEMATOCRIT: 40.3 % (ref 34.8–46.6)
Hemoglobin: 13.3 g/dL (ref 11.6–15.9)
LYMPHS ABS: 2.6 10*3/uL (ref 0.9–3.3)
Lymphocytes Relative: 35 %
MCH: 32.7 pg (ref 25.1–34.0)
MCHC: 33 g/dL (ref 31.5–36.0)
MCV: 99 fL (ref 79.5–101.0)
MONOS PCT: 9 %
Monocytes Absolute: 0.7 10*3/uL (ref 0.1–0.9)
NEUTROS PCT: 54 %
Neutro Abs: 4 10*3/uL (ref 1.5–6.5)
Platelets: 221 10*3/uL (ref 145–400)
RBC: 4.07 MIL/uL (ref 3.70–5.45)
RDW: 12.4 % (ref 11.2–14.5)
WBC: 7.4 10*3/uL (ref 3.9–10.3)

## 2018-03-24 LAB — FERRITIN: FERRITIN: 75 ng/mL (ref 9–269)

## 2018-03-24 LAB — IRON AND TIBC
Iron: 155 ug/dL — ABNORMAL HIGH (ref 41–142)
Saturation Ratios: 61 % — ABNORMAL HIGH (ref 21–57)
TIBC: 255 ug/dL (ref 236–444)
UIBC: 101 ug/dL

## 2018-03-24 MED ORDER — SODIUM CHLORIDE 0.9 % IV SOLN
Freq: Once | INTRAVENOUS | Status: AC
  Administered 2018-03-24: 16:00:00 via INTRAVENOUS

## 2018-03-24 NOTE — Telephone Encounter (Signed)
Scheduled appt per 6/13 los - pt aware of appts -  No print out wanted.

## 2018-03-24 NOTE — Progress Notes (Signed)
[facility] OFFICE PROGRESS NOTE  Date of Service:  03/24/2018   Lauree Chandler, NP [phone] [address]  DIAGNOSIS: Hereditary hemochromatosis ([place]) - Plan: US Abdomen Complete, AFP tumor marker  CURRENT TREATMENT: PRN phlebotomy to keep ferritin <=100, and transferrin saturation less than 75%  INTERVAL HISTORY:   Denise Lane 84 y.o. female with a history of hemochromatosis is here for follow up. She was last seen by me 8 months ago. Since she was last seen by me she has only had 1 phlebotomy.  She presents to the clinic today accompanied by her daughter. She notes her husband passed this week due to health complications. Patient currently lives alone for now. She is currently coping after caring for him for 4 years after her had a stroke in 2015.     On review of symptoms, pt notes fatigue, arthritis pain especially in her right arm. She does not take NSAIDs. She has tried PT before.      MEDICAL HISTORY: Past Medical History:  Diagnosis Date  . Basal cell cancer    "face, legs" (10/18/2012)  . Bruises easily   . Chronic lower back pain   . Depression    takes Effexor daily  . Diverticulosis of sigmoid colon 11-20-2009  . Dry eyes    uses Refresh eye drops daily as needed  . Dysphagia   . Gastric ulcer   . GERD (gastroesophageal reflux disease)    takes Omeprazole daily  . H/O hiatal hernia   . Hemochromatosis 1987  . Hemochromatosis   . Hemorrhoids, internal 11-20-09  . History of blood transfusion    "S/P colonoscopy after polyp removed; got 2 units; esophageal bleed got 1 unit; really messed up hemochromatosis" (1/7/20214)  . History of bronchitis 1991-1996   "chronic; related to winter" (10/18/2012)  . History of colonic polyps    NOTED 11-20-09 IN COLONOSCOPY REPORT  . Hyperlipidemia    no meds required  . Hypertension    takes Metoprolol and Losartan daily  . Joint pain   . Joint swelling   . Muscle pain   . Muscle spasm     takes Robaxin daily as needed  . Occasional tremors   . Osteoarthritis   . Osteoarthritis of right hip 10/18/2012  . Osteoporosis   . Overactive bladder    takes Myrbetriq daily  . PONV (postoperative nausea and vomiting)   . Rheumatic fever    hx of  . Squamous carcinoma    "LLE" (10/18/2012)  . Stenosis of esophagus   . Tremor, essential 10-29-09  . Urinary urgency    takes Ditropan daily  . Vomiting    occasionally    INTERIM HISTORY: has Hemochromatosis; Osteoarthritis of right hip; Muscle pain; Hyperlipidemia; Urinary incontinence; Back pain; Osteoarthritis; Neuropathy; Anxiety and depression; Osteoarthritis of left hip; Right thigh pain; Periprosthetic hip fracture; Hip fracture ([place]); Leg cramps; and Morton's neuroma of left foot on their problem list.    ALLERGIES:  is allergic to codeine; dilaudid [hydromorphone hcl]; macrodantin; morphine and related; sulfa antibiotics; and ibuprofen.  MEDICATIONS: has a current medication list which includes the following prescription(s): acetaminophen, calcium citrate, carboxymethylcellulose, cholecalciferol, gabapentin, losartan, metoprolol tartrate, oxybutynin, ranitidine, and venlafaxine xr.  SURGICAL HISTORY:  Past Surgical History:  Procedure Laterality Date  . APPENDECTOMY  1952  . CATARACT EXTRACTION W/ INTRAOCULAR LENS  IMPLANT, BILATERAL  2001   "both eyes" (10/18/2012)  . CHOLECYSTECTOMY  1977  . COLONOSCOPY    .  DILATION AND CURETTAGE OF UTERUS  1975  . ESOPHAGOGASTRODUODENOSCOPY    . [place]  . SHOULDER ARTHROSCOPY W/ ROTATOR CUFF REPAIR  1998?   "left" (10/18/2012)  . SKIN CANCER EXCISION     "multiple" (10/18/2012)  . TONSILLECTOMY  1953  . TOTAL HIP ARTHROPLASTY  10/18/2012   "right" (10/18/2012)  . TOTAL HIP ARTHROPLASTY  10/18/2012   Procedure: TOTAL HIP ARTHROPLASTY;  Surgeon: Johnny Bridge, MD;  Location: [facility];  Service: Orthopedics;  Laterality: Right;  . TOTAL HIP ARTHROPLASTY Left 07/24/2014    dr Mardelle Matte  . TOTAL HIP ARTHROPLASTY Left 07/24/2014   Procedure: LEFT TOTAL HIP ARTHROPLASTY;  Surgeon: Johnny Bridge, MD;  Location: [facility];  Service: Orthopedics;  Laterality: Left;  . TUBAL LIGATION  1975    REVIEW OF SYSTEMS:   Constitutional: Denies fevers, chills or abnormal weight loss (+) tiredness Eyes: Denies blurriness of vision Ears, nose, mouth, throat, and face: Denies mucositis or sore throat Respiratory: Denies cough, dyspnea or wheezes Cardiovascular: Denies palpitation, chest discomfort or lower extremity swelling Gastrointestinal:  Denies nausea, heartburn or change in bowel habits Skin: Denies abnormal skin rashes MSK: (+) arthritis pain, mostly in right arm Lymphatics: Denies new lymphadenopathy or easy bruising Neurological:Denies numbness, tingling or new weaknesses Behavioral/Psych: Mood is stable, no new changes  All other systems were reviewed with the patient and are negative.  PHYSICAL EXAMINATION: ECOG PERFORMANCE STATUS: 1 - Symptomatic but completely ambulatory  Blood pressure (!) 164/60, pulse (!) 59, temperature 97.9 F (36.6 C), temperature source Oral, resp. rate 17, height 5\' 6"  (1.676 m), weight 117 lb 4.8 oz (53.2 kg), SpO2 100 %.  GENERAL:alert, no distress and comfortable; elderly, thin and ambulates with a cane SKIN: skin color, texture, turgor are normal, no rashes or significant lesions EYES: normal, Conjunctiva are pink and non-injected, sclera clear OROPHARYNX:no exudate, no erythema and lips, buccal mucosa, and tongue normal  NECK: supple, thyroid normal size, non-tender, without nodularity LYMPH:  no palpable lymphadenopathy in the cervical, axillary or supraclavicular LUNGS: clear to auscultation with normal breathing effort, no wheezes or rhonchi HEART: regular rate & rhythm and no murmurs and no lower extremity edema ABDOMEN:abdomen soft, non-tender and normal bowel sounds Musculoskeletal:no cyanosis of digits and no clubbing;  Stable arthritic changes to fingers bilaterally NEURO: alert & oriented x 3 with fluent speech, no focal motor/sensory deficits  Labs:  CBC Latest Ref Rng & Units 03/24/2018 01/05/2018 10/08/2017  WBC 3.9 - 10.3 K/uL 7.4 7.3 7.8  Hemoglobin 11.6 - 15.9 g/dL 13.3 12.3 12.5  Hematocrit 34.8 - 46.6 % 40.3 36.2 37.9  Platelets 145 - 400 K/uL 221 256 222    CMP Latest Ref Rng & Units 01/05/2018 06/04/2017 07/30/2016  Glucose 65 - 99 mg/dL - 129(H) -  BUN 4 - 21 26(A) 27 23(A)  Creatinine 0.5 - 1.1 0.6 0.72 0.7  Sodium 137 - 147 141 140 141  Potassium 3.4 - 5.3 4.4 4.3 4.6  Chloride 96 - 106 mmol/L - 100 -  CO2 20 - 29 mmol/L - 23 -  Calcium 8.7 - 10.3 mg/dL - 9.6 -  Total Protein 6.0 - 8.5 g/dL - 6.2 -  Total Bilirubin 0.0 - 1.2 mg/dL - 0.3 -  Alkaline Phos 25 - 125 84 76 81  AST 13 - 35 14 18 19   ALT 7 - 35 11 14 16     Results for RYVER, ZADROZNY (MRN [id number]) as of 03/24/2018 10:23  Ref.  Range 06/04/2017 12:43 08/06/2017 13:14 10/08/2017 13:53 01/05/2018 13:13  Iron Latest Ref Range: 41 - 142 ug/dL 171 (H) 108 192 (H) 171 (H)  UIBC Latest Units: ug/dL 42 (L) 133 28 (L) 62  TIBC Latest Ref Range: 236 - 444 ug/dL 213 (L) 241 220 (L) 233 (L)  %SAT Latest Ref Range: 21 - 57 % 80 (H) 45 87 (H)   Saturation Ratios Latest Ref Range: 21 - 57 %    74 (H)  Ferritin Latest Ref Range: 9 - 269 ng/mL 114 95 55 60  PENDING for 03/24/18    RADIOGRAPHIC STUDIES: No results found.  ASSESSMENT: Glorie Dowlen Narine 84 y.o. female with a history of Hereditary hemochromatosis ([place]) - Plan: US Abdomen Complete, AFP tumor marker   1. Hemachromatosis:  -given her advanced age and symptomatic phlebotomy (presyncope), I will do phlebotomy as needed every 3 months with higher threshold -she has been tolerating Phlebotomy well overall, and does feel better overall after phlebotomy. -Her serum iron and transferrin saturation is remaining to be very high lately, I suggest her to have fasting lab draw, to  avoid the impact of food on her iron level. If her ferritin >75 or transferrin saturation>75%, I will recommend phlebotomy, I'll do 0.75 unit with normal saline 250 mL infusion. Her iron study result is usually not available during her visit, and OK to do Phlebotomy based on her last lab results. -Her liver ultrasound on 10/2016 showed no liver or spleen abnormality. Will repeat US of liver in 10/2017.  -Her last phlebotomy was 10/2017. Her previous iron level was 171 in 12/2017. Based on previous labs I recommend phlebotomy today. She agrees.  -Today's CBC WNL, stable.  -She has been more fatigued lately, likely due to her age and recent stress from her husband's death. I will spread her phlebotomies out to every 3 months.  -F/u in 6 months with [place] of abdomen    2. Osteoporosis -Followed by Dr. Dewaine Oats     PLAN -phlebotomy today  -Lab, and phlebotomy in 3, and 6 months  -[place] of abdomen and F/u in 6 months on same day   All questions were answered. The patient knows to call the clinic with any problems, questions or concerns. We can certainly see the patient much sooner if necessary.  I spent 15 minutes counseling the patient face to face. The total time spent in the appointment was 20 minutes.    Truitt Merle, MD 03/24/2018   I, Joslyn Devon, am acting as scribe for Truitt Merle, MD.   I have reviewed the above documentation for accuracy and completeness, and I agree with the above.

## 2018-03-24 NOTE — Patient Instructions (Signed)

## 2018-03-24 NOTE — Telephone Encounter (Signed)
Per Dr. Burr Medico spoke with Jeannene Patella (patient's daughter) instructed she may have breakfast but do not have lunch before her blood is drawn today at 1:45.  She verbalized an understanding.

## 2018-03-24 NOTE — Telephone Encounter (Signed)
-----   Message from Truitt Merle, MD sent at 03/23/2018 11:27 PM EDT ----- Could you tell pt not to have lunch before her lab at 1:45pm on 6/13? OK to have breakfast, thanks  Krista Blue

## 2018-04-06 ENCOUNTER — Ambulatory Visit: Payer: Medicare HMO | Admitting: Hematology

## 2018-04-06 ENCOUNTER — Other Ambulatory Visit: Payer: Medicare HMO

## 2018-04-11 ENCOUNTER — Other Ambulatory Visit: Payer: Self-pay | Admitting: Nurse Practitioner

## 2018-04-11 DIAGNOSIS — I10 Essential (primary) hypertension: Secondary | ICD-10-CM

## 2018-04-18 ENCOUNTER — Other Ambulatory Visit: Payer: Self-pay | Admitting: Nurse Practitioner

## 2018-04-18 ENCOUNTER — Other Ambulatory Visit: Payer: Self-pay | Admitting: Internal Medicine

## 2018-04-18 DIAGNOSIS — F32A Depression, unspecified: Secondary | ICD-10-CM

## 2018-04-18 DIAGNOSIS — F419 Anxiety disorder, unspecified: Principal | ICD-10-CM

## 2018-04-18 DIAGNOSIS — F329 Major depressive disorder, single episode, unspecified: Secondary | ICD-10-CM

## 2018-05-20 DIAGNOSIS — M542 Cervicalgia: Secondary | ICD-10-CM | POA: Diagnosis not present

## 2018-05-20 DIAGNOSIS — M25511 Pain in right shoulder: Secondary | ICD-10-CM | POA: Diagnosis not present

## 2018-05-31 DIAGNOSIS — H35033 Hypertensive retinopathy, bilateral: Secondary | ICD-10-CM | POA: Diagnosis not present

## 2018-05-31 DIAGNOSIS — H40013 Open angle with borderline findings, low risk, bilateral: Secondary | ICD-10-CM | POA: Diagnosis not present

## 2018-05-31 DIAGNOSIS — Z961 Presence of intraocular lens: Secondary | ICD-10-CM | POA: Diagnosis not present

## 2018-05-31 DIAGNOSIS — H353131 Nonexudative age-related macular degeneration, bilateral, early dry stage: Secondary | ICD-10-CM | POA: Diagnosis not present

## 2018-06-17 DIAGNOSIS — M25531 Pain in right wrist: Secondary | ICD-10-CM | POA: Diagnosis not present

## 2018-06-17 DIAGNOSIS — M542 Cervicalgia: Secondary | ICD-10-CM | POA: Diagnosis not present

## 2018-06-17 DIAGNOSIS — M25511 Pain in right shoulder: Secondary | ICD-10-CM | POA: Diagnosis not present

## 2018-06-20 ENCOUNTER — Ambulatory Visit (INDEPENDENT_AMBULATORY_CARE_PROVIDER_SITE_OTHER): Payer: Medicare HMO

## 2018-06-20 ENCOUNTER — Ambulatory Visit (INDEPENDENT_AMBULATORY_CARE_PROVIDER_SITE_OTHER): Payer: Medicare HMO | Admitting: Internal Medicine

## 2018-06-20 ENCOUNTER — Encounter: Payer: Self-pay | Admitting: Internal Medicine

## 2018-06-20 VITALS — BP 148/68 | HR 78 | Temp 97.8°F | Ht 66.0 in | Wt 121.0 lb

## 2018-06-20 DIAGNOSIS — I10 Essential (primary) hypertension: Secondary | ICD-10-CM

## 2018-06-20 DIAGNOSIS — E441 Mild protein-calorie malnutrition: Secondary | ICD-10-CM | POA: Insufficient documentation

## 2018-06-20 DIAGNOSIS — M810 Age-related osteoporosis without current pathological fracture: Secondary | ICD-10-CM | POA: Diagnosis not present

## 2018-06-20 DIAGNOSIS — F419 Anxiety disorder, unspecified: Secondary | ICD-10-CM | POA: Diagnosis not present

## 2018-06-20 DIAGNOSIS — F329 Major depressive disorder, single episode, unspecified: Secondary | ICD-10-CM

## 2018-06-20 DIAGNOSIS — K219 Gastro-esophageal reflux disease without esophagitis: Secondary | ICD-10-CM | POA: Diagnosis not present

## 2018-06-20 DIAGNOSIS — Z23 Encounter for immunization: Secondary | ICD-10-CM

## 2018-06-20 DIAGNOSIS — G629 Polyneuropathy, unspecified: Secondary | ICD-10-CM

## 2018-06-20 DIAGNOSIS — F32A Depression, unspecified: Secondary | ICD-10-CM

## 2018-06-20 DIAGNOSIS — N3281 Overactive bladder: Secondary | ICD-10-CM | POA: Diagnosis not present

## 2018-06-20 DIAGNOSIS — Z0000 Encounter for general adult medical examination without abnormal findings: Secondary | ICD-10-CM | POA: Diagnosis not present

## 2018-06-20 NOTE — Patient Instructions (Signed)
We'll check your B12 and TSH due to your neuropathy and hair and nail changes.

## 2018-06-20 NOTE — Patient Instructions (Addendum)
[name] , Thank you for taking time to come for your Medicare Wellness Visit. I appreciate your ongoing commitment to your health goals. Please review the following plan we discussed and let me know if I can assist you in the future.   Screening recommendations/referrals: Colonoscopy excluded, over age 82 Mammogram excluded, over age 8 Bone Density up to date Recommended yearly ophthalmology/optometry visit for glaucoma screening and checkup Recommended yearly dental visit for hygiene and checkup  Vaccinations: Influenza vaccine high dose given today Pneumococcal vaccine up to date, completed Tdap vaccine up to date, due 05/28/2023 Shingles vaccine up to date, completed    Advanced directives: in chart  Conditions/risks identified: none  Next appointment: [name], RN 06/26/2019 @   Preventive Care 60 Years and Older, Female Preventive care refers to lifestyle choices and visits with your health care provider that can promote health and wellness. What does preventive care include?  A yearly physical exam. This is also called an annual well check.  Dental exams once or twice a year.  Routine eye exams. Ask your health care provider how often you should have your eyes checked.  Personal lifestyle choices, including:  Daily care of your teeth and gums.  Regular physical activity.  Eating a healthy diet.  Avoiding tobacco and drug use.  Limiting alcohol use.  Practicing safe sex.  Taking low-dose aspirin every day.  Taking vitamin and mineral supplements as recommended by your health care provider. What happens during an annual well check? The services and screenings done by your health care provider during your annual well check will depend on your age, overall health, lifestyle risk factors, and family history of disease. Counseling  Your health care provider may ask you questions about your:  Alcohol use.  Tobacco use.  Drug use.  Emotional  well-being.  Home and relationship well-being.  Sexual activity.  Eating habits.  History of falls.  Memory and ability to understand (cognition).  Work and work Statistician.  Reproductive health. Screening  You may have the following tests or measurements:  Height, weight, and BMI.  Blood pressure.  Lipid and cholesterol levels. These may be checked every 5 years, or more frequently if you are over 58 years old.  Skin check.  Lung cancer screening. You may have this screening every year starting at age 3 if you have a 30-pack-year history of smoking and currently smoke or have quit within the past 15 years.  Fecal occult blood test (FOBT) of the stool. You may have this test every year starting at age 23.  Flexible sigmoidoscopy or colonoscopy. You may have a sigmoidoscopy every 5 years or a colonoscopy every 10 years starting at age 75.  Hepatitis C blood test.  Hepatitis B blood test.  Sexually transmitted disease (STD) testing.  Diabetes screening. This is done by checking your blood sugar (glucose) after you have not eaten for a while (fasting). You may have this done every 1-3 years.  Bone density scan. This is done to screen for osteoporosis. You may have this done starting at age 45.  Mammogram. This may be done every 1-2 years. Talk to your health care provider about how often you should have regular mammograms. Talk with your health care provider about your test results, treatment options, and if necessary, the need for more tests. Vaccines  Your health care provider may recommend certain vaccines, such as:  Influenza vaccine. This is recommended every year.  Tetanus, diphtheria, and acellular pertussis (Tdap, Td) vaccine.  You may need a Td booster every 10 years.  Zoster vaccine. You may need this after age 22.  Pneumococcal 13-valent conjugate (PCV13) vaccine. One dose is recommended after age 76.  Pneumococcal polysaccharide (PPSV23) vaccine. One  dose is recommended after age 69. Talk to your health care provider about which screenings and vaccines you need and how often you need them. This information is not intended to replace advice given to you by your health care provider. Make sure you discuss any questions you have with your health care provider. Document Released: 10/25/2015 Document Revised: 06/17/2016 Document Reviewed: 07/30/2015 Elsevier Interactive Patient Education  2017 [place] Prevention in the Home Falls can cause injuries. They can happen to people of all ages. There are many things you can do to make your home safe and to help prevent falls. What can I do on the outside of my home?  Regularly fix the edges of walkways and driveways and fix any cracks.  Remove anything that might make you trip as you walk through a door, such as a raised step or threshold.  Trim any bushes or trees on the path to your home.  Use bright outdoor lighting.  Clear any walking paths of anything that might make someone trip, such as rocks or tools.  Regularly check to see if handrails are loose or broken. Make sure that both sides of any steps have handrails.  Any raised decks and porches should have guardrails on the edges.  Have any leaves, snow, or ice cleared regularly.  Use sand or salt on walking paths during winter.  Clean up any spills in your garage right away. This includes oil or grease spills. What can I do in the bathroom?  Use night lights.  Install grab bars by the toilet and in the tub and shower. Do not use towel bars as grab bars.  Use non-skid mats or decals in the tub or shower.  If you need to sit down in the shower, use a plastic, non-slip stool.  Keep the floor dry. Clean up any water that spills on the floor as soon as it happens.  Remove soap buildup in the tub or shower regularly.  Attach bath mats securely with double-sided non-slip rug tape.  Do not have throw rugs and other  things on the floor that can make you trip. What can I do in the bedroom?  Use night lights.  Make sure that you have a light by your bed that is easy to reach.  Do not use any sheets or blankets that are too big for your bed. They should not hang down onto the floor.  Have a firm chair that has side arms. You can use this for support while you get dressed.  Do not have throw rugs and other things on the floor that can make you trip. What can I do in the kitchen?  Clean up any spills right away.  Avoid walking on wet floors.  Keep items that you use a lot in easy-to-reach places.  If you need to reach something above you, use a strong step stool that has a grab bar.  Keep electrical cords out of the way.  Do not use floor polish or wax that makes floors slippery. If you must use wax, use non-skid floor wax.  Do not have throw rugs and other things on the floor that can make you trip. What can I do with my stairs?  Do not leave any  items on the stairs.  Make sure that there are handrails on both sides of the stairs and use them. Fix handrails that are broken or loose. Make sure that handrails are as long as the stairways.  Check any carpeting to make sure that it is firmly attached to the stairs. Fix any carpet that is loose or worn.  Avoid having throw rugs at the top or bottom of the stairs. If you do have throw rugs, attach them to the floor with carpet tape.  Make sure that you have a light switch at the top of the stairs and the bottom of the stairs. If you do not have them, ask someone to add them for you. What else can I do to help prevent falls?  Wear shoes that:  Do not have high heels.  Have rubber bottoms.  Are comfortable and fit you well.  Are closed at the toe. Do not wear sandals.  If you use a stepladder:  Make sure that it is fully opened. Do not climb a closed stepladder.  Make sure that both sides of the stepladder are locked into place.  Ask  someone to hold it for you, if possible.  Clearly mark and make sure that you can see:  Any grab bars or handrails.  First and last steps.  Where the edge of each step is.  Use tools that help you move around (mobility aids) if they are needed. These include:  Canes.  Walkers.  Scooters.  Crutches.  Turn on the lights when you go into a dark area. Replace any light bulbs as soon as they burn out.  Set up your furniture so you have a clear path. Avoid moving your furniture around.  If any of your floors are uneven, fix them.  If there are any pets around you, be aware of where they are.  Review your medicines with your doctor. Some medicines can make you feel dizzy. This can increase your chance of falling. Ask your doctor what other things that you can do to help prevent falls. This information is not intended to replace advice given to you by your health care provider. Make sure you discuss any questions you have with your health care provider. Document Released: 07/25/2009 Document Revised: 03/05/2016 Document Reviewed: 11/02/2014 Elsevier Interactive Patient Education  2017 [employer].

## 2018-06-20 NOTE — Progress Notes (Signed)
Subjective:   Denise Lane is a 84 y.o. female who presents for Medicare Annual (Subsequent) preventive examination.  Last AWV-11/19/2016    Objective:     Vitals: BP (!) 148/68 (BP Location: Left Arm, Patient Position: Sitting)   Pulse 78   Temp 97.8 F (36.6 C) (Oral)   Ht 5\' 6"  (1.676 m)   Wt 121 lb (54.9 kg)   SpO2 97%   BMI 19.53 kg/m   Body mass index is 19.53 kg/m.  Provider notified of BP  Advanced Directives 06/20/2018 05/20/2017 02/05/2017 11/19/2016 05/14/2016 11/21/2015 11/07/2015  Does Patient Have a Medical Advance Directive? Yes Yes Yes No;Yes Yes Yes Yes  Type of Paramedic of [place];Living will [place];Living will [place];Living will Healthcare Power of [place];Living will Healthcare Power of [place];Living will  Does patient want to make changes to medical advance directive? No - Patient declined - - - - No - Patient declined No - Patient declined  Copy of [place] in Chart? Yes Yes No - copy requested No - copy requested Yes Yes Yes  Would patient like information on creating a medical advance directive? - - - No - Patient declined - - -  Pre-existing out of facility DNR order (yellow form or pink MOST form) - - - - - - -    Tobacco Social History   Tobacco Use  Smoking Status Never Smoker  Smokeless Tobacco Never Used     Counseling given: Not Answered   Clinical Intake:  Pre-visit preparation completed: No  Pain : 0-10 Pain Score: 3  Pain Type: Chronic pain Pain Location: Wrist Pain Orientation: Right Pain Descriptors / Indicators: Tingling Pain Onset: More than a month ago Pain Frequency: Intermittent     Nutritional Risks: None Diabetes: No  How often do you need to have someone help you when you read instructions, pamphlets, or other written materials from your doctor or pharmacy?: 1 -  Never What is the last grade level you completed in school?: High school  Interpreter Needed?: No  Information entered by :: [name], RN  Past Medical History:  Diagnosis Date  . Basal cell cancer    "face, legs" (10/18/2012)  . Bruises easily   . Chronic lower back pain   . Depression    takes Effexor daily  . Diverticulosis of sigmoid colon 11-20-2009  . Dry eyes    uses Refresh eye drops daily as needed  . Dysphagia   . Gastric ulcer   . GERD (gastroesophageal reflux disease)    takes Omeprazole daily  . H/O hiatal hernia   . Hemochromatosis 1987  . Hemochromatosis   . Hemorrhoids, internal 11-20-09  . History of blood transfusion    "S/P colonoscopy after polyp removed; got 2 units; esophageal bleed got 1 unit; really messed up hemochromatosis" (1/7/20214)  . History of bronchitis 1991-1996   "chronic; related to winter" (10/18/2012)  . History of colonic polyps    NOTED 11-20-09 IN COLONOSCOPY REPORT  . Hyperlipidemia    no meds required  . Hypertension    takes Metoprolol and Losartan daily  . Joint pain   . Joint swelling   . Muscle pain   . Muscle spasm    takes Robaxin daily as needed  . Occasional tremors   . Osteoarthritis   . Osteoarthritis of right hip 10/18/2012  . Osteoporosis   . Overactive bladder  takes Myrbetriq daily  . PONV (postoperative nausea and vomiting)   . Rheumatic fever    hx of  . Squamous carcinoma    "LLE" (10/18/2012)  . Stenosis of esophagus   . Tremor, essential 10-29-09  . Urinary urgency    takes Ditropan daily  . Vomiting    occasionally   Past Surgical History:  Procedure Laterality Date  . APPENDECTOMY  1952  . CATARACT EXTRACTION W/ INTRAOCULAR LENS  IMPLANT, BILATERAL  2001   "both eyes" (10/18/2012)  . CHOLECYSTECTOMY  1977  . COLONOSCOPY    . DILATION AND CURETTAGE OF UTERUS  1975  . ESOPHAGOGASTRODUODENOSCOPY    . [facility]  . SHOULDER ARTHROSCOPY W/ ROTATOR CUFF REPAIR  1998?   "left"  (10/18/2012)  . SKIN CANCER EXCISION     "multiple" (10/18/2012)  . TONSILLECTOMY  1953  . TOTAL HIP ARTHROPLASTY  10/18/2012   "right" (10/18/2012)  . TOTAL HIP ARTHROPLASTY  10/18/2012   Procedure: TOTAL HIP ARTHROPLASTY;  Surgeon: Johnny Bridge, MD;  Location: [facility];  Service: Orthopedics;  Laterality: Right;  . TOTAL HIP ARTHROPLASTY Left 07/24/2014   dr Mardelle Matte  . TOTAL HIP ARTHROPLASTY Left 07/24/2014   Procedure: LEFT TOTAL HIP ARTHROPLASTY;  Surgeon: Johnny Bridge, MD;  Location: [facility];  Service: Orthopedics;  Laterality: Left;  . TUBAL LIGATION  1975   Family History  Problem Relation Age of Onset  . Heart disease Mother   . Heart disease Father   . Hyperlipidemia Daughter   . Heart disease Brother   . Heart disease Brother   . Heart disease Brother   . Heart disease Brother   . Cancer Sister   . Melanoma Sister   . Alzheimer's disease Sister   . Heart disease Sister   . Osteoporosis Sister    Social History   Socioeconomic History  . Marital status: Widowed    Spouse name: Not on file  . Number of children: Not on file  . Years of education: Not on file  . Highest education level: Not on file  Occupational History  . Not on file  Social Needs  . Financial resource strain: Not hard at all  . Food insecurity:    Worry: Never true    Inability: Never true  . Transportation needs:    Medical: No    Non-medical: No  Tobacco Use  . Smoking status: Never Smoker  . Smokeless tobacco: Never Used  Substance and Sexual Activity  . Alcohol use: No  . Drug use: No  . Sexual activity: Never    Birth control/protection: Post-menopausal  Lifestyle  . Physical activity:    Days per week: 0 days    Minutes per session: 0 min  . Stress: Only a little  Relationships  . Social connections:    Talks on phone: Three times a week    Gets together: Once a week    Attends religious service: Never    Active member of club or organization: No    Attends meetings of clubs or  organizations: Never    Relationship status: Widowed  Other Topics Concern  . Not on file  Social History Narrative  . Not on file    Outpatient Encounter Medications as of 06/20/2018  Medication Sig  . acetaminophen (TYLENOL) 650 MG CR tablet Take 650 mg by mouth 2 (two) times daily.   . calcium citrate (CALCITRATE - DOSED IN MG ELEMENTAL CALCIUM) 950 MG tablet Take  1-2 tablets by mouth 3 (three) times daily. 2 tablets in am and 1 tablet with lunch and supper  . carboxymethylcellulose (REFRESH PLUS) 0.5 % SOLN Place 1 drop into both eyes 3 (three) times daily as needed. For dry eyes  . cholecalciferol (VITAMIN D) 1000 UNITS tablet Take 1,000 Units by mouth 2 (two) times daily.  Javier Docker Oil 350 MG CAPS Take 1 capsule by mouth daily.  Marland Kitchen losartan (COZAAR) 50 MG tablet TAKE 1 TABLET EVERY DAY  . metoprolol tartrate (LOPRESSOR) 25 MG tablet TAKE 1 TABLET EVERY DAY  . oxybutynin (DITROPAN) 5 MG tablet TAKE 1 TABLET TWICE DAILY  . pantoprazole (PROTONIX) 40 MG tablet Take 1 tablet (40 mg total) by mouth daily.  Marland Kitchen venlafaxine XR (EFFEXOR-XR) 37.5 MG 24 hr capsule TAKE 1 CAPSULE EVERY DAY WITH BREAKFAST   No facility-administered encounter medications on file as of 06/20/2018.     Activities of Daily Living In your present state of health, do you have any difficulty performing the following activities: 06/20/2018  Hearing? N  Vision? N  Difficulty concentrating or making decisions? N  Walking or climbing stairs? Y  Dressing or bathing? N  Doing errands, shopping? N  Preparing Food and eating ? N  Using the Toilet? N  In the past six months, have you accidently leaked urine? Y  Do you have problems with loss of bowel control? N  Managing your Medications? N  Managing your Finances? N  Housekeeping or managing your Housekeeping? N  Some recent data might be hidden    Patient Care Team: Lauree Chandler, NP as PCP - General (Geriatric Medicine)    Assessment:   This is a routine  wellness examination for Denise Lane.  Exercise Activities and Dietary recommendations Current Exercise Habits: The patient does not participate in regular exercise at present, Exercise limited by: orthopedic condition(s)  Goals    . Gain weight     Starting 11/19/16, I will attempt to increase my weight by 10-15 lbs. I would like to weight around 130-135 lb.        Fall Risk Fall Risk  06/20/2018 12/02/2017 05/20/2017 11/19/2016 05/14/2016  Falls in the past year? No Yes No No No  Comment - Pt fell backward while placing pine needles under ramp at home about 3 weeks ago.  - - -  Number falls in past yr: - 1 - - -  Injury with Fall? - No - - -  Risk for fall due to : - - - History of fall(s) -   Is the patient's home free of loose throw rugs in walkways, pet beds, electrical cords, etc?   yes      Grab bars in the bathroom? yes      Handrails on the stairs?   yes      Adequate lighting?   yes  Depression Screen PHQ 2/9 Scores 06/20/2018 12/02/2017 11/19/2016 11/21/2015  PHQ - 2 Score 0 0 0 0     Cognitive Function MMSE - Mini Mental State Exam 06/20/2018 11/19/2016  Orientation to time 4 5  Orientation to Place 5 5  Registration 3 3  Attention/ Calculation 5 5  Recall 0 3  Language- name 2 objects 2 2  Language- repeat 1 1  Language- follow 3 step command 3 3  Language- read & follow direction 1 1  Write a sentence 1 1  Copy design 1 1  Total score 26 30        Immunization  History  Administered Date(s) Administered  . Influenza, High Dose Seasonal PF 07/08/2016, 07/18/2017, 06/20/2018  . Influenza-Unspecified 06/12/2012, 07/04/2013, 06/23/2014, 07/09/2015  . Pneumococcal Conjugate-13 05/14/2016  . Pneumococcal Polysaccharide-23 07/25/2014  . Td 04/19/2007  . Tdap 05/27/2013  . Zoster 09/24/2011  . Zoster Recombinat (Shingrix) 05/24/2017, 08/23/2017    Qualifies for Shingles Vaccine?up to date, completed  Screening Tests Health Maintenance  Topic Date Due  . MAMMOGRAM   02/19/2018  . TETANUS/TDAP  05/28/2023  . INFLUENZA VACCINE  Completed  . DEXA SCAN  Completed  . PNA vac Low Risk Adult  Completed    Cancer Screenings: Lung: Low Dose CT Chest recommended if Age 36-80 years, 30 pack-year currently smoking OR have quit w/in 15years. Patient does not qualify. Breast:  Up to date on Mammogram? Yes   Up to date of Bone Density/Dexa? Yes Colorectal: up to date  Additional Screenings:  Hepatitis C Screening: declined High dose flu vaccine due: given today    Plan:    I have personally reviewed and addressed the Medicare Annual Wellness questionnaire and have noted the following in the patient's chart:  A. Medical and social history B. Use of alcohol, tobacco or illicit drugs  C. Current medications and supplements D. Functional ability and status E.  Nutritional status F.  Physical activity G. Advance directives H. List of other physicians I.  Hospitalizations, surgeries, and ER visits in previous 12 months J.  [place] to include hearing, vision, cognitive, depression L. Referrals and appointments - none  In addition, I have reviewed and discussed with patient certain preventive protocols, quality metrics, and best practice recommendations. A written personalized care plan for preventive services as well as general preventive health recommendations were provided to patient.  See attached scanned questionnaire for additional information.   Signed,   [name], RN Nurse Health Advisor  Patient Concerns: None

## 2018-06-20 NOTE — Progress Notes (Signed)
Location:  [facility] clinic Provider:  Vieno Tarrant L. Mariea Clonts, D.O., C.M.D.  Code Status: full code Goals of Care:  Advanced Directives 06/20/2018  Does Patient Have a Medical Advance Directive? Yes  Type of Paramedic of [place];Living will  Does patient want to make changes to medical advance directive? No - Patient declined  Copy of [place] in Chart? Yes  Would patient like information on creating a medical advance directive? -  Pre-existing out of facility DNR order (yellow form or pink MOST form) -     Chief Complaint  Patient presents with  . Medical Management of Chronic Issues    4mth follow-up    HPI: Patient is a 84 y.o. female seen today for medical management of chronic diseases--she sees NP Dewaine Oats as PCP.   She has a h/o hereditary hemochromatosis (sees Dr. Burr Medico), htn, hyperlipidemia, chronic low back pain, overactive bladder, Morton's neuroma, periprosthetic hip fx, osteoporosis, GERD.  Her husband passed away earlier this year (he had dementia and had a terrible stroke and GI bleeding).  She was his primary caregiver for quite a while.  Her daughter notices that she has declined some since that time with the stress of his estate and her grief.  She continues to live alone in [place] but her daughter is checking on her regularly and not allowing [place].  She has lost a few pts on her mmse. She's also dealing with right arm neuropathic pain and neuropathy in her feet.  She's lost weight and may not eat like she should.    She's better since getting her wrist in a splint.  She has some pinched nerves in her neck and some rotator cuff issues.  Burns. Wearing a wrist splint.  Had a shoulder injection 4 wks ago and a medrol dose pack.  She's a little better, but that's when brace started.  Goes back again oct 7th.  Did have CT of neck in the past.  Pain did begin in the shoulder and started going down the arm.    Her  daughter is an Therapist, sports and along with her today.  Her daughter that maybe she had something going on in her thyroid. Has dry scaly skin on legs--derm suggested things didn't help, also brittle nails, dry scaly skin on scalp, loss of eyebrows.  Never sees anything but feels it. Then dust comes off when she fluffs her hair. Different shampoos and hair products have not helped.   On gabapentin for burning in feet--using two per day.  Had stopped at one time but back on it.   Still gets phlebotomies and regular checkups--will have annual ultrasound coming up.  Has had an echo which was fine.    Cannot take nsaids.  Made her feel like she had to go to the bathroom all the time.  Tolerates oxybutynin better than myrbetriq. Less bothersome with OAB lately.  Past Medical History:  Diagnosis Date  . Basal cell cancer    "face, legs" (10/18/2012)  . Bruises easily   . Chronic lower back pain   . Depression    takes Effexor daily  . Diverticulosis of sigmoid colon 11-20-2009  . Dry eyes    uses Refresh eye drops daily as needed  . Dysphagia   . Gastric ulcer   . GERD (gastroesophageal reflux disease)    takes Omeprazole daily  . H/O hiatal hernia   . Hemochromatosis 1987  . Hemochromatosis   .  Hemorrhoids, internal 11-20-09  . History of blood transfusion    "S/P colonoscopy after polyp removed; got 2 units; esophageal bleed got 1 unit; really messed up hemochromatosis" (1/7/20214)  . History of bronchitis 1991-1996   "chronic; related to winter" (10/18/2012)  . History of colonic polyps    NOTED 11-20-09 IN COLONOSCOPY REPORT  . Hyperlipidemia    no meds required  . Hypertension    takes Metoprolol and Losartan daily  . Joint pain   . Joint swelling   . Muscle pain   . Muscle spasm    takes Robaxin daily as needed  . Occasional tremors   . Osteoarthritis   . Osteoarthritis of right hip 10/18/2012  . Osteoporosis   . Overactive bladder    takes Myrbetriq daily  . PONV (postoperative nausea and  vomiting)   . Rheumatic fever    hx of  . Squamous carcinoma    "LLE" (10/18/2012)  . Stenosis of esophagus   . Tremor, essential 10-29-09  . Urinary urgency    takes Ditropan daily  . Vomiting    occasionally    Past Surgical History:  Procedure Laterality Date  . APPENDECTOMY  1952  . CATARACT EXTRACTION W/ INTRAOCULAR LENS  IMPLANT, BILATERAL  2001   "both eyes" (10/18/2012)  . CHOLECYSTECTOMY  1977  . COLONOSCOPY    . DILATION AND CURETTAGE OF UTERUS  1975  . ESOPHAGOGASTRODUODENOSCOPY    . [place]  . SHOULDER ARTHROSCOPY W/ ROTATOR CUFF REPAIR  1998?   "left" (10/18/2012)  . SKIN CANCER EXCISION     "multiple" (10/18/2012)  . TONSILLECTOMY  1953  . TOTAL HIP ARTHROPLASTY  10/18/2012   "right" (10/18/2012)  . TOTAL HIP ARTHROPLASTY  10/18/2012   Procedure: TOTAL HIP ARTHROPLASTY;  Surgeon: Johnny Bridge, MD;  Location: [facility];  Service: Orthopedics;  Laterality: Right;  . TOTAL HIP ARTHROPLASTY Left 07/24/2014   dr Mardelle Matte  . TOTAL HIP ARTHROPLASTY Left 07/24/2014   Procedure: LEFT TOTAL HIP ARTHROPLASTY;  Surgeon: Johnny Bridge, MD;  Location: [facility];  Service: Orthopedics;  Laterality: Left;  . TUBAL LIGATION  1975    Allergies  Allergen Reactions  . Codeine Nausea And Vomiting  . Dilaudid [Hydromorphone Hcl] Hives and Other (See Comments)    "whelps" (10/18/2012)  . Macrodantin Hives and Other (See Comments)    "drug fever; chills; felt terrible" (10/18/2012)  . Morphine And Related Hives and Nausea And Vomiting  . Sulfa Antibiotics Hives, Rash and Other (See Comments)    "got real sick" (10/18/2012)  . Ibuprofen Other (See Comments)    "felt like I have to go to the bathroom constantly"    Outpatient Encounter Medications as of 06/20/2018  Medication Sig  . acetaminophen (TYLENOL) 650 MG CR tablet Take 650 mg by mouth 2 (two) times daily.   . calcium citrate (CALCITRATE - DOSED IN MG ELEMENTAL CALCIUM) 950 MG tablet Take 1-2 tablets by mouth 3 (three) times  daily. 2 tablets in am and 1 tablet with lunch and supper  . carboxymethylcellulose (REFRESH PLUS) 0.5 % SOLN Place 1 drop into both eyes 3 (three) times daily as needed. For dry eyes  . cholecalciferol (VITAMIN D) 1000 UNITS tablet Take 1,000 Units by mouth 2 (two) times daily.  Javier Docker Oil 350 MG CAPS Take 1 capsule by mouth daily.  Marland Kitchen losartan (COZAAR) 50 MG tablet TAKE 1 TABLET EVERY DAY  . methylPREDNISolone (MEDROL DOSEPAK) 4 MG TBPK tablet TAKE  AS DIRECTED FOR 6 DAYS  . metoprolol tartrate (LOPRESSOR) 25 MG tablet TAKE 1 TABLET EVERY DAY  . oxybutynin (DITROPAN) 5 MG tablet TAKE 1 TABLET TWICE DAILY  . pantoprazole (PROTONIX) 40 MG tablet Take 1 tablet (40 mg total) by mouth daily.  Marland Kitchen venlafaxine XR (EFFEXOR-XR) 37.5 MG 24 hr capsule TAKE 1 CAPSULE EVERY DAY WITH BREAKFAST   No facility-administered encounter medications on file as of 06/20/2018.     Review of Systems:  Review of Systems  Constitutional: Negative for chills, fever and malaise/fatigue.  HENT: Negative for congestion.   Eyes: Negative for blurred vision.  Respiratory: Negative for cough and shortness of breath.   Cardiovascular: Negative for chest pain, palpitations and leg swelling.  Gastrointestinal: Negative for abdominal pain.  Genitourinary: Negative for dysuria.       OAB but less bothersome recently  Musculoskeletal: Negative for joint pain and myalgias.  Skin: Negative for itching and rash.  Neurological: Positive for tingling and sensory change. Negative for dizziness and loss of consciousness.       Right fingers, hand upper arm and pain in shoulder  Endo/Heme/Allergies: Does not bruise/bleed easily.  Psychiatric/Behavioral: Positive for memory loss. Negative for depression. The patient is not nervous/anxious and does not have insomnia.     Health Maintenance  Topic Date Due  . MAMMOGRAM  02/19/2018  . TETANUS/TDAP  05/28/2023  . INFLUENZA VACCINE  Completed  . DEXA SCAN  Completed  . PNA vac Low  Risk Adult  Completed    Physical Exam: Vitals:   06/20/18 1513  BP: (!) 148/68  Pulse: 78  Temp: 97.8 F (36.6 C)  TempSrc: Oral  SpO2: 97%  Weight: 121 lb (54.9 kg)  Height: 5\' 6"  (1.676 m)   Body mass index is 19.53 kg/m. Physical Exam  Constitutional: She is oriented to person, place, and time. She appears well-developed. No distress.  Thin female  HENT:  Head: Normocephalic and atraumatic.  Neck:  Small nodule palpable anterior to SCM on left (chronic), firm, nontender  Cardiovascular: Normal rate, regular rhythm, normal heart sounds and intact distal pulses.  Pulmonary/Chest: Effort normal and breath sounds normal. No respiratory distress.  Abdominal: Bowel sounds are normal.  Musculoskeletal: Normal range of motion.  Neurological: She is alert and oriented to person, place, and time.  Skin: Skin is warm and dry.  Psychiatric: She has a normal mood and affect.    Labs reviewed: Basic Metabolic Panel: Recent Labs    01/05/18  NA 141  K 4.4  BUN 26*  CREATININE 0.6   Liver Function Tests: Recent Labs    01/05/18  AST 14  ALT 11  ALKPHOS 84   No results for input(s): LIPASE, AMYLASE in the last 8760 hours. No results for input(s): AMMONIA in the last 8760 hours. CBC: Recent Labs    10/08/17 1353 01/05/18 1313 03/24/18 1347  WBC 7.8 7.3 7.4  NEUTROABS 4.3 4.1 4.0  HGB 12.5 12.3 13.3  HCT 37.9 36.2 40.3  MCV 99.2 97.1 99.0  PLT 222 256 221   Assessment/Plan 1. Essential hypertension -bp a bit elevated--reports she does not like to come to the doctor  2. Age-related osteoporosis without current pathological fracture -ongoing, cont calcium and vitamin D, not on osteoporosis meds and currently on her second medrol dose pak for her shoulder  3. Overactive bladder -ongoing, takes oxybutynin which may be affecting her cognitive state also, but did not tolerate myrbetriq  4. Gastroesophageal reflux disease without esophagitis -continues  also on PPI  which may weaken bones as well and she's frail  5. Hereditary hemochromatosis ([place]) -since 1987 diagnosis, following with Dr. Burr Medico and has appt coming up for iron panel and [place]  6. Anxiety and depression -not reported today, on effexor which may be increasing bp along with steroids  7. Neuropathy - newly worse, ?due to hemochromatosis vs other issue - Vitamin B12--check due to her diet not being so good per her daughter - TSH  8. Malnutrition of mild degree (HCC) - Vitamin B12 - TSH   Labs/tests ordered:   Orders Placed This Encounter  Procedures  . Vitamin B12  . TSH   Next appt:  F/u 6 mos with Janett Billow  Luvina Poirier L. Talissa Apple, D.O. [facility] [address], [place] [address] Cell Phone (Mon-Fri 8am-5pm):  [phone] On Call:  [phone] & follow prompts after 5pm & weekends Office Phone:  [phone] Office Fax:  [phone]

## 2018-06-21 LAB — VITAMIN B12: Vitamin B-12: 702 pg/mL (ref 200–1100)

## 2018-06-21 LAB — TSH: TSH: 2.06 mIU/L (ref 0.40–4.50)

## 2018-06-23 ENCOUNTER — Encounter: Payer: Self-pay | Admitting: *Deleted

## 2018-06-23 ENCOUNTER — Other Ambulatory Visit: Payer: Self-pay | Admitting: Nurse Practitioner

## 2018-06-23 DIAGNOSIS — F329 Major depressive disorder, single episode, unspecified: Secondary | ICD-10-CM

## 2018-06-23 DIAGNOSIS — F419 Anxiety disorder, unspecified: Principal | ICD-10-CM

## 2018-06-24 ENCOUNTER — Inpatient Hospital Stay: Payer: Medicare HMO

## 2018-06-24 ENCOUNTER — Ambulatory Visit (HOSPITAL_COMMUNITY)
Admission: RE | Admit: 2018-06-24 | Discharge: 2018-06-24 | Disposition: A | Discharge status: Home / Self Care | Payer: Medicare HMO | Source: Ambulatory Visit | Attending: Hematology | Admitting: Hematology

## 2018-06-24 ENCOUNTER — Inpatient Hospital Stay: Payer: Medicare HMO | Attending: Hematology

## 2018-06-24 DIAGNOSIS — Z0389 Encounter for observation for other suspected diseases and conditions ruled out: Secondary | ICD-10-CM | POA: Diagnosis not present

## 2018-06-24 DIAGNOSIS — Z9049 Acquired absence of other specified parts of digestive tract: Secondary | ICD-10-CM | POA: Diagnosis not present

## 2018-06-24 LAB — CBC WITH DIFFERENTIAL/PLATELET
BASOS PCT: 0 %
Basophils Absolute: 0 10*3/uL (ref 0.0–0.1)
EOS ABS: 0.1 10*3/uL (ref 0.0–0.5)
EOS PCT: 2 %
HCT: 41.1 % (ref 34.8–46.6)
HEMOGLOBIN: 13.5 g/dL (ref 11.6–15.9)
LYMPHS ABS: 2.8 10*3/uL (ref 0.9–3.3)
Lymphocytes Relative: 37 %
MCH: 32.6 pg (ref 25.1–34.0)
MCHC: 32.8 g/dL (ref 31.5–36.0)
MCV: 99.3 fL (ref 79.5–101.0)
Monocytes Absolute: 0.6 10*3/uL (ref 0.1–0.9)
Monocytes Relative: 8 %
NEUTROS PCT: 53 %
Neutro Abs: 4 10*3/uL (ref 1.5–6.5)
PLATELETS: 249 10*3/uL (ref 145–400)
RBC: 4.14 MIL/uL (ref 3.70–5.45)
RDW: 12.5 % (ref 11.2–14.5)
WBC: 7.6 10*3/uL (ref 3.9–10.3)

## 2018-06-24 LAB — IRON AND TIBC
Iron: 213 ug/dL — ABNORMAL HIGH (ref 41–142)
SATURATION RATIOS: 80 % — AB (ref 21–57)
TIBC: 266 ug/dL (ref 236–444)
UIBC: 54 ug/dL

## 2018-06-24 LAB — FERRITIN: Ferritin: 69 ng/mL (ref 11–307)

## 2018-06-24 NOTE — Progress Notes (Signed)
Pt here for lab and phlebotomy.  Only CBC results available.  Dr. Burr Medico reviewed iron studies results from 03/24/18.  Per Dr. Burr Medico,  Pt can be discharged today.  When iron studies results from today available, if Ferritin or Transferrin saturation levels increased, pt will be contacted for phlebotomy schedule as per Dr. Burr Medico.  Explanations given to pt and daughter in the lobby.  Both voiced understanding.

## 2018-06-25 LAB — AFP TUMOR MARKER: AFP, Serum, Tumor Marker: <0.9 ng/mL (ref 0.0–8.3)

## 2018-06-28 ENCOUNTER — Telehealth: Payer: Self-pay

## 2018-06-28 NOTE — Telephone Encounter (Signed)
Spoke with [name] patient's daughter. Explained iron level is high, needs phlebotomy. She verbalized an understanding and states they must call her first to schedule as she has to get off work to bring her.  Scheduling message sent with instructions to contact the daughter.

## 2018-06-28 NOTE — Telephone Encounter (Signed)
-----   Message from Truitt Merle, MD sent at 06/25/2018 10:06 AM EDT ----- Please let pt know the [place] results, no concerns.   Please also let her know that her iron level was high from yesterday, and schedule a phlebotomy for her next week (she did not do it yesterday due to pending iron results). Be prepared to do phlebotomy also on next lab visit in 3 months (shceduled). Thanks   Truitt Merle  06/25/2018

## 2018-06-29 ENCOUNTER — Telehealth: Payer: Self-pay | Admitting: Hematology

## 2018-06-29 NOTE — Telephone Encounter (Signed)
LVM for pts daughter regarding upcoming appts per 9/17 sch message

## 2018-07-01 ENCOUNTER — Inpatient Hospital Stay: Payer: Medicare HMO

## 2018-07-01 MED ORDER — SODIUM CHLORIDE 0.9 % IV SOLN
Freq: Once | INTRAVENOUS | Status: AC
  Administered 2018-07-01: 16:00:00 via INTRAVENOUS
  Filled 2018-07-01: qty 250

## 2018-07-01 NOTE — Patient Instructions (Signed)

## 2018-07-18 DIAGNOSIS — G5601 Carpal tunnel syndrome, right upper limb: Secondary | ICD-10-CM | POA: Diagnosis not present

## 2018-07-18 DIAGNOSIS — M25511 Pain in right shoulder: Secondary | ICD-10-CM | POA: Diagnosis not present

## 2018-07-18 DIAGNOSIS — M542 Cervicalgia: Secondary | ICD-10-CM | POA: Diagnosis not present

## 2018-09-14 DIAGNOSIS — G5601 Carpal tunnel syndrome, right upper limb: Secondary | ICD-10-CM | POA: Diagnosis not present

## 2018-09-21 NOTE — Progress Notes (Signed)
[name]   [phone]) [phone] [phone]) [phone]   Clinic Follow up Note   Patient Care Team: Lauree Chandler, NP as PCP - General (Geriatric Medicine)  Date of Service:  09/23/2018  CHIEF COMPLAINT: F/u of Hereditary hemochromatosis    CURRENT THERAPY:  PRN phlebotomy to keep ferritin <=75, and transferrin saturation less than 75%  INTERVAL HISTORY:  Denise Lane is here for a follow up. She presents to the clinic today accompanied by her daughter. She notes she is still handling the loss of her husband. She still lives alone and taking care of herself. She notes neuropathy in her feet so she is extra cautious of falling. She ambulated with cane but has walker and wheelchair at home. She notes she does not cook as much as before but she eats out often. She still drives but not much in [place]. Her only child has been screened for Hemachromatosis.  She notes she is up to date on her flu and shingles shot.     REVIEW OF SYSTEMS:   Constitutional: Denies fevers, chills or abnormal weight loss Eyes: Denies blurriness of vision Ears, nose, mouth, throat, and face: Denies mucositis or sore throat Respiratory: Denies cough, dyspnea or wheezes Cardiovascular: Denies palpitation, chest discomfort or lower extremity swelling Gastrointestinal:  Denies nausea, heartburn or change in bowel habits Skin: Denies abnormal skin rashes Lymphatics: Denies new lymphadenopathy or easy bruising Neurological:Denies new weaknesses (+) neuropathy in feet, ambulates with cane  Behavioral/Psych: Mood is stable, no new changes  All other systems were reviewed with the patient and are negative.  MEDICAL HISTORY:  Past Medical History:  Diagnosis Date  . Basal cell cancer    "face, legs" (10/18/2012)  . Bruises easily   . Chronic lower back pain   . Depression    takes Effexor daily  . Diverticulosis of sigmoid colon 11-20-2009  . Dry eyes    uses Refresh eye drops  daily as needed  . Dysphagia   . Gastric ulcer   . GERD (gastroesophageal reflux disease)    takes Omeprazole daily  . H/O hiatal hernia   . Hemochromatosis 1987  . Hemochromatosis   . Hemorrhoids, internal 11-20-09  . History of blood transfusion    "S/P colonoscopy after polyp removed; got 2 units; esophageal bleed got 1 unit; really messed up hemochromatosis" (1/7/20214)  . History of bronchitis 1991-1996   "chronic; related to winter" (10/18/2012)  . History of colonic polyps    NOTED 11-20-09 IN COLONOSCOPY REPORT  . Hyperlipidemia    no meds required  . Hypertension    takes Metoprolol and Losartan daily  . Joint pain   . Joint swelling   . Muscle pain   . Muscle spasm    takes Robaxin daily as needed  . Occasional tremors   . Osteoarthritis   . Osteoarthritis of right hip 10/18/2012  . Osteoporosis   . Overactive bladder    takes Myrbetriq daily  . PONV (postoperative nausea and vomiting)   . Rheumatic fever    hx of  . Squamous carcinoma    "LLE" (10/18/2012)  . Stenosis of esophagus   . Tremor, essential 10-29-09  . Urinary urgency    takes Ditropan daily  . Vomiting    occasionally    SURGICAL HISTORY: Past Surgical History:  Procedure Laterality Date  . APPENDECTOMY  1952  . CATARACT EXTRACTION W/ INTRAOCULAR LENS  IMPLANT, BILATERAL  2001   "both eyes" (10/18/2012)  . CHOLECYSTECTOMY  1977  . COLONOSCOPY    . DILATION AND CURETTAGE OF UTERUS  1975  . ESOPHAGOGASTRODUODENOSCOPY    . [place]  . SHOULDER ARTHROSCOPY W/ ROTATOR CUFF REPAIR  1998?   "left" (10/18/2012)  . SKIN CANCER EXCISION     "multiple" (10/18/2012)  . TONSILLECTOMY  1953  . TOTAL HIP ARTHROPLASTY  10/18/2012   "right" (10/18/2012)  . TOTAL HIP ARTHROPLASTY  10/18/2012   Procedure: TOTAL HIP ARTHROPLASTY;  Surgeon: Johnny Bridge, MD;  Location: [facility];  Service: Orthopedics;  Laterality: Right;  . TOTAL HIP ARTHROPLASTY Left 07/24/2014   dr Mardelle Matte  . TOTAL HIP ARTHROPLASTY Left  07/24/2014   Procedure: LEFT TOTAL HIP ARTHROPLASTY;  Surgeon: Johnny Bridge, MD;  Location: [facility];  Service: Orthopedics;  Laterality: Left;  . TUBAL LIGATION  1975    I have reviewed the social history and family history with the patient and they are unchanged from previous note.  ALLERGIES:  is allergic to codeine; dilaudid [hydromorphone hcl]; macrodantin; morphine and related; sulfa antibiotics; and ibuprofen.  MEDICATIONS:  Current Outpatient Medications  Medication Sig Dispense Refill  . acetaminophen (TYLENOL) 650 MG CR tablet Take 650 mg by mouth 2 (two) times daily.     . calcium citrate (CALCITRATE - DOSED IN MG ELEMENTAL CALCIUM) 950 MG tablet Take 1-2 tablets by mouth 3 (three) times daily. 2 tablets in am and 1 tablet with lunch and supper    . carboxymethylcellulose (REFRESH PLUS) 0.5 % SOLN Place 1 drop into both eyes 3 (three) times daily as needed. For dry eyes    . cholecalciferol (VITAMIN D) 1000 UNITS tablet Take 1,000 Units by mouth 2 (two) times daily.    Javier Docker Oil 350 MG CAPS Take 1 capsule by mouth daily.    Marland Kitchen losartan (COZAAR) 50 MG tablet TAKE 1 TABLET EVERY DAY 90 tablet 3  . metoprolol tartrate (LOPRESSOR) 25 MG tablet TAKE 1 TABLET EVERY DAY 90 tablet 2  . oxybutynin (DITROPAN) 5 MG tablet TAKE 1 TABLET TWICE DAILY 180 tablet 1  . pantoprazole (PROTONIX) 40 MG tablet Take 1 tablet (40 mg total) by mouth daily. 90 tablet 3  . venlafaxine XR (EFFEXOR-XR) 37.5 MG 24 hr capsule TAKE 1 CAPSULE EVERY DAY WITH BREAKFAST 90 capsule 1   No current facility-administered medications for this visit.     PHYSICAL EXAMINATION: ECOG PERFORMANCE STATUS: 1 - Symptomatic but completely ambulatory  Vitals:   09/23/18 1410  BP: (!) 143/97  Pulse: 62  Resp: 16  Temp: 98.2 F (36.8 C)  SpO2: 98%   Filed Weights   09/23/18 1410  Weight: 125 lb 9.6 oz (57 kg)    GENERAL:alert, no distress and comfortable SKIN: skin color, texture, turgor are normal, no rashes or  significant lesions EYES: normal, Conjunctiva are pink and non-injected, sclera clear OROPHARYNX:no exudate, no erythema and lips, buccal mucosa, and tongue normal  NECK: supple, thyroid normal size, non-tender, without nodularity LYMPH:  no palpable lymphadenopathy in the cervical, axillary or inguinal LUNGS: clear to auscultation and percussion with normal breathing effort HEART: regular rate & rhythm and no murmurs and no lower extremity edema ABDOMEN:abdomen soft, non-tender and normal bowel sounds Musculoskeletal:no cyanosis of digits and no clubbing  NEURO: alert & oriented x 3 with fluent speech, no focal motor/sensory deficits  LABORATORY DATA:  I have reviewed the data as listed CBC Latest Ref Rng & Units 09/23/2018 06/24/2018 03/24/2018  WBC 4.0 - 10.5 K/uL 7.6  7.6 7.4  Hemoglobin 12.0 - 15.0 g/dL 12.7 13.5 13.3  Hematocrit 36.0 - 46.0 % 38.2 41.1 40.3  Platelets 150 - 400 K/uL 217 249 221     CMP Latest Ref Rng & Units 01/05/2018 06/04/2017 07/30/2016  Glucose 65 - 99 mg/dL - 129(H) -  BUN 4 - 21 26(A) 27 23(A)  Creatinine 0.5 - 1.1 0.6 0.72 0.7  Sodium 137 - 147 141 140 141  Potassium 3.4 - 5.3 4.4 4.3 4.6  Chloride 96 - 106 mmol/L - 100 -  CO2 20 - 29 mmol/L - 23 -  Calcium 8.7 - 10.3 mg/dL - 9.6 -  Total Protein 6.0 - 8.5 g/dL - 6.2 -  Total Bilirubin 0.0 - 1.2 mg/dL - 0.3 -  Alkaline Phos 25 - 125 84 76 81  AST 13 - 35 14 18 19   ALT 7 - 35 11 14 16       RADIOGRAPHIC STUDIES: I have personally reviewed the radiological images as listed and agreed with the findings in the report. No results found.   ASSESSMENT & PLAN:  ODETTE WATANABE is a 84 y.o. female with    1. Hemachromatosis:  -She is currently being treated with as needed phlebotomies every 3 months. She is tolerating well. -If her ferritin >75 or transferrin saturation>75%, I will recommend phlebotomy, I'll do 0.75 unit with normal saline 500 mL infusion. Her iron study result is usually not  available during her visit, and OK to do Phlebotomy based on her last lab results. -Labs reviewed her CBC is WNL. Her ferritin and iron panel is still pending. Based on previous lab 3 months ago she will proceed with phlebotomy today.  -Her 06/2018 US abdomen was unremarkable.  I discussed with patient. -She is up to date on her vaccinations.  -Due to the winter season and her advanced age, her next lab and phlebotomy will be in 4 months   2. Osteoporosis -Followed by Dr. Dewaine Oats   3. Neuropathy in feet -She ambulated with cane.  -I encouraged her to watch for fall.    PLAN -phlebotomy today  -Lab, phlebotomy in 4 and 7 months -F/u in 7 months     No problem-specific Assessment & Plan notes found for this encounter.   No orders of the defined types were placed in this encounter.  All questions were answered. The patient knows to call the clinic with any problems, questions or concerns. No barriers to learning was detected. I spent 10 minutes counseling the patient face to face. The total time spent in the appointment was 15 minutes and more than 50% was on counseling and review of test results     Truitt Merle, MD 09/23/2018   I, Joslyn Devon, am acting as scribe for Truitt Merle, MD.   I have reviewed the above documentation for accuracy and completeness, and I agree with the above.

## 2018-09-23 ENCOUNTER — Inpatient Hospital Stay: Payer: Medicare HMO

## 2018-09-23 ENCOUNTER — Telehealth: Payer: Self-pay | Admitting: Hematology

## 2018-09-23 ENCOUNTER — Inpatient Hospital Stay (HOSPITAL_BASED_OUTPATIENT_CLINIC_OR_DEPARTMENT_OTHER): Payer: Medicare HMO | Admitting: Hematology

## 2018-09-23 ENCOUNTER — Inpatient Hospital Stay: Payer: Medicare HMO | Attending: Hematology

## 2018-09-23 DIAGNOSIS — Z79899 Other long term (current) drug therapy: Secondary | ICD-10-CM

## 2018-09-23 DIAGNOSIS — G629 Polyneuropathy, unspecified: Secondary | ICD-10-CM

## 2018-09-23 DIAGNOSIS — K219 Gastro-esophageal reflux disease without esophagitis: Secondary | ICD-10-CM

## 2018-09-23 DIAGNOSIS — I10 Essential (primary) hypertension: Secondary | ICD-10-CM | POA: Insufficient documentation

## 2018-09-23 DIAGNOSIS — M810 Age-related osteoporosis without current pathological fracture: Secondary | ICD-10-CM | POA: Insufficient documentation

## 2018-09-23 DIAGNOSIS — E785 Hyperlipidemia, unspecified: Secondary | ICD-10-CM | POA: Diagnosis not present

## 2018-09-23 LAB — CBC WITH DIFFERENTIAL/PLATELET
ABS IMMATURE GRANULOCYTES: 0.02 10*3/uL (ref 0.00–0.07)
BASOS PCT: 0 %
Basophils Absolute: 0 10*3/uL (ref 0.0–0.1)
EOS PCT: 3 %
Eosinophils Absolute: 0.2 10*3/uL (ref 0.0–0.5)
HCT: 38.2 % (ref 36.0–46.0)
HEMOGLOBIN: 12.7 g/dL (ref 12.0–15.0)
Immature Granulocytes: 0 %
Lymphocytes Relative: 31 %
Lymphs Abs: 2.3 10*3/uL (ref 0.7–4.0)
MCH: 32.8 pg (ref 26.0–34.0)
MCHC: 33.2 g/dL (ref 30.0–36.0)
MCV: 98.7 fL (ref 80.0–100.0)
MONO ABS: 0.7 10*3/uL (ref 0.1–1.0)
MONOS PCT: 10 %
NEUTROS ABS: 4.3 10*3/uL (ref 1.7–7.7)
Neutrophils Relative %: 56 %
Platelets: 217 10*3/uL (ref 150–400)
RBC: 3.87 MIL/uL (ref 3.87–5.11)
RDW: 11.9 % (ref 11.5–15.5)
WBC: 7.6 10*3/uL (ref 4.0–10.5)
nRBC: 0 % (ref 0.0–0.2)

## 2018-09-23 LAB — IRON AND TIBC
IRON: 160 ug/dL — AB (ref 41–142)
SATURATION RATIOS: 67 % — AB (ref 21–57)
TIBC: 239 ug/dL (ref 236–444)
UIBC: 79 ug/dL — ABNORMAL LOW (ref 120–384)

## 2018-09-23 LAB — FERRITIN: FERRITIN: 39 ng/mL (ref 11–307)

## 2018-09-23 MED ORDER — SODIUM CHLORIDE 0.9 % IV SOLN
Freq: Once | INTRAVENOUS | Status: AC
  Administered 2018-09-23: 16:00:00 via INTRAVENOUS
  Filled 2018-09-23: qty 250

## 2018-09-23 NOTE — Telephone Encounter (Signed)
Printed calendar and avs. °

## 2018-09-23 NOTE — Patient Instructions (Signed)

## 2018-09-23 NOTE — Progress Notes (Signed)
Per Dr. Burr Medico pt to have 375G removed through phlebotomy followed by 556mL NS over one hour. Phlebotomy recommended d/t saturation ratios of 80 from 06/24/18.

## 2018-09-24 ENCOUNTER — Encounter: Payer: Self-pay | Admitting: Hematology

## 2018-11-27 ENCOUNTER — Other Ambulatory Visit: Payer: Self-pay | Admitting: Nurse Practitioner

## 2018-12-22 ENCOUNTER — Ambulatory Visit (INDEPENDENT_AMBULATORY_CARE_PROVIDER_SITE_OTHER): Payer: Medicare HMO | Admitting: Nurse Practitioner

## 2018-12-22 ENCOUNTER — Other Ambulatory Visit: Payer: Self-pay

## 2018-12-22 ENCOUNTER — Encounter: Payer: Medicare HMO | Admitting: Nurse Practitioner

## 2018-12-22 ENCOUNTER — Encounter: Payer: Self-pay | Admitting: Nurse Practitioner

## 2018-12-22 VITALS — BP 122/60 | HR 79 | Temp 97.4°F | Ht 66.0 in | Wt 125.4 lb

## 2018-12-22 DIAGNOSIS — N3281 Overactive bladder: Secondary | ICD-10-CM | POA: Diagnosis not present

## 2018-12-22 DIAGNOSIS — M159 Polyosteoarthritis, unspecified: Secondary | ICD-10-CM

## 2018-12-22 DIAGNOSIS — K219 Gastro-esophageal reflux disease without esophagitis: Secondary | ICD-10-CM

## 2018-12-22 DIAGNOSIS — F419 Anxiety disorder, unspecified: Secondary | ICD-10-CM

## 2018-12-22 DIAGNOSIS — I10 Essential (primary) hypertension: Secondary | ICD-10-CM | POA: Diagnosis not present

## 2018-12-22 DIAGNOSIS — M810 Age-related osteoporosis without current pathological fracture: Secondary | ICD-10-CM | POA: Diagnosis not present

## 2018-12-22 DIAGNOSIS — F329 Major depressive disorder, single episode, unspecified: Secondary | ICD-10-CM | POA: Diagnosis not present

## 2018-12-22 DIAGNOSIS — G629 Polyneuropathy, unspecified: Secondary | ICD-10-CM | POA: Diagnosis not present

## 2018-12-22 MED ORDER — UNABLE TO FIND: 0 refills | Status: DC

## 2018-12-22 NOTE — Progress Notes (Signed)
Careteam: Patient Care Team: Lauree Chandler, NP as PCP - General (Geriatric Medicine)  Advanced Directive information Does Patient Have a Medical Advance Directive?: Yes, Type of Advance Directive: [place];Living will, Does patient want to make changes to medical advance directive?: No - Patient declined  Allergies  Allergen Reactions  . Codeine Nausea And Vomiting  . Dilaudid [Hydromorphone Hcl] Hives and Other (See Comments)    "whelps" (10/18/2012)  . Macrodantin Hives and Other (See Comments)    "drug fever; chills; felt terrible" (10/18/2012)  . Morphine And Related Hives and Nausea And Vomiting  . Sulfa Antibiotics Hives, Rash and Other (See Comments)    "got real sick" (10/18/2012)  . Ibuprofen Other (See Comments)    "felt like I have to go to the bathroom constantly"    Chief Complaint  Patient presents with  . Annual Exam    [email or web address] is her with her daughter Jeannene Patella      HPI: Patient is a 85 y.o. female seen in the office today for routine follow up.  Pt states she does not want a physical. Does not wish to have breast exam.   She has a h/o hereditary hemochromatosis (sees Dr. Burr Medico), htn, hyperlipidemia, chronic low back pain, overactive bladder, Morton's neuroma, periprosthetic hip fx, osteoporosis, GERD.   She's better since getting her wrist in a splint.  She has some pinched nerves in her neck and some rotator cuff issues.  Burns. Wearing a wrist splint.  Had a shoulder injection 4 wks ago and a medrol dose pack.  She's a little better, but that's when brace started.  following with Dr Mardelle Matte- wearing brace, has carpel tunnel. Overall pain is stable. Injected wrist and shoulder.   Neuropathy- On gabapentin for burning in feet--1 per day.  Had stopped at one time but back on it.  Using cinnamon which has been beneficial   Still gets phlebotomies- following with hematology  OAB- stable on oxybutynin   GERD- using protonix, has tired  to go without but increase in symptoms.   htn-stable on losartan and lopressor daily  Review of Systems:  Review of Systems  Constitutional: Negative for chills, fever and malaise/fatigue.  HENT: Negative for congestion.   Eyes: Negative for blurred vision.  Respiratory: Negative for cough and shortness of breath.   Cardiovascular: Negative for chest pain, palpitations and leg swelling.  Gastrointestinal: Negative for abdominal pain.  Genitourinary: Negative for dysuria.       OAB but less bothersome recently  Musculoskeletal: Negative for joint pain and myalgias.  Skin: Negative for itching and rash.  Neurological: Positive for tingling and sensory change. Negative for dizziness and loss of consciousness.       Right fingers, hand upper arm and pain in shoulder and heels  Endo/Heme/Allergies: Does not bruise/bleed easily.  Psychiatric/Behavioral: Positive for memory loss (stable). Negative for depression. The patient is not nervous/anxious and does not have insomnia.     Past Medical History:  Diagnosis Date  . Basal cell cancer    "face, legs" (10/18/2012)  . Bruises easily   . Chronic lower back pain   . Depression    takes Effexor daily  . Diverticulosis of sigmoid colon 11-20-2009  . Dry eyes    uses Refresh eye drops daily as needed  . Dysphagia   . Gastric ulcer   . GERD (gastroesophageal reflux disease)    takes Omeprazole daily  . H/O hiatal hernia   . Hemochromatosis 1987  .  Hemochromatosis   . Hemorrhoids, internal 11-20-09  . History of blood transfusion    "S/P colonoscopy after polyp removed; got 2 units; esophageal bleed got 1 unit; really messed up hemochromatosis" (1/7/20214)  . History of bronchitis 1991-1996   "chronic; related to winter" (10/18/2012)  . History of colonic polyps    NOTED 11-20-09 IN COLONOSCOPY REPORT  . Hyperlipidemia    no meds required  . Hypertension    takes Metoprolol and Losartan daily  . Joint pain   . Joint swelling   . Muscle  pain   . Muscle spasm    takes Robaxin daily as needed  . Occasional tremors   . Osteoarthritis   . Osteoarthritis of right hip 10/18/2012  . Osteoporosis   . Overactive bladder    takes Myrbetriq daily  . PONV (postoperative nausea and vomiting)   . Rheumatic fever    hx of  . Squamous carcinoma    "LLE" (10/18/2012)  . Stenosis of esophagus   . Tremor, essential 10-29-09  . Urinary urgency    takes Ditropan daily  . Vomiting    occasionally   Past Surgical History:  Procedure Laterality Date  . APPENDECTOMY  1952  . CATARACT EXTRACTION W/ INTRAOCULAR LENS  IMPLANT, BILATERAL  2001   "both eyes" (10/18/2012)  . CHOLECYSTECTOMY  1977  . COLONOSCOPY    . DILATION AND CURETTAGE OF UTERUS  1975  . ESOPHAGOGASTRODUODENOSCOPY    . [place]  . SHOULDER ARTHROSCOPY W/ ROTATOR CUFF REPAIR  1998?   "left" (10/18/2012)  . SKIN CANCER EXCISION     "multiple" (10/18/2012)  . TONSILLECTOMY  1953  . TOTAL HIP ARTHROPLASTY  10/18/2012   "right" (10/18/2012)  . TOTAL HIP ARTHROPLASTY  10/18/2012   Procedure: TOTAL HIP ARTHROPLASTY;  Surgeon: Johnny Bridge, MD;  Location: [facility];  Service: Orthopedics;  Laterality: Right;  . TOTAL HIP ARTHROPLASTY Left 07/24/2014   dr Mardelle Matte  . TOTAL HIP ARTHROPLASTY Left 07/24/2014   Procedure: LEFT TOTAL HIP ARTHROPLASTY;  Surgeon: Johnny Bridge, MD;  Location: [facility];  Service: Orthopedics;  Laterality: Left;  . TUBAL LIGATION  1975   Social History:   reports that she has never smoked. She has never used smokeless tobacco. She reports that she does not drink alcohol or use drugs.  Family History  Problem Relation Age of Onset  . Heart disease Mother   . Heart disease Father   . Hyperlipidemia Daughter   . Heart disease Brother   . Heart disease Brother   . Heart disease Brother   . Heart disease Brother   . Cancer Sister   . Melanoma Sister   . Alzheimer's disease Sister   . Heart disease Sister   . Osteoporosis Sister      Medications: Patient's Medications  New Prescriptions   No medications on file  Previous Medications   ACETAMINOPHEN (TYLENOL) 650 MG CR TABLET    Take 650 mg by mouth 2 (two) times daily.    CALCIUM CITRATE (CALCITRATE - DOSED IN MG ELEMENTAL CALCIUM) 950 MG TABLET    Take 1 tablet by mouth daily.    CARBOXYMETHYLCELLULOSE (REFRESH PLUS) 0.5 % SOLN    Place 1 drop into both eyes 3 (three) times daily as needed. For dry eyes   CHOLECALCIFEROL (VITAMIN D) 1000 UNITS TABLET    Take 1,000 Units by mouth 2 (two) times daily.   KRILL OIL 350 MG CAPS    Take 1  capsule by mouth daily.   LOSARTAN (COZAAR) 50 MG TABLET    TAKE 1 TABLET EVERY DAY   METOPROLOL TARTRATE (LOPRESSOR) 25 MG TABLET    TAKE 1 TABLET EVERY DAY   OXYBUTYNIN (DITROPAN) 5 MG TABLET    TAKE 1 TABLET TWICE DAILY (SUBSTITUTED FOR DITROPAN)   PANTOPRAZOLE (PROTONIX) 40 MG TABLET    Take 1 tablet (40 mg total) by mouth daily.   VENLAFAXINE XR (EFFEXOR-XR) 37.5 MG 24 HR CAPSULE    TAKE 1 CAPSULE EVERY DAY WITH BREAKFAST  Modified Medications   No medications on file  Discontinued Medications   No medications on file     Physical Exam:  Vitals:   12/22/18 1303  BP: 122/60  Pulse: 79  Temp: (!) 97.4 F (36.3 C)  TempSrc: Oral  SpO2: 97%  Weight: 125 lb 6.4 oz (56.9 kg)  Height: 5\' 6"  (1.676 m)   Body mass index is 20.24 kg/m.  Physical Exam Constitutional:      General: She is not in acute distress.    Appearance: She is well-developed. She is not diaphoretic.     Comments: Thin female  HENT:     Head: Normocephalic and atraumatic.     Mouth/Throat:     Pharynx: No oropharyngeal exudate.  Eyes:     Conjunctiva/sclera: Conjunctivae normal.     Pupils: Pupils are equal, round, and reactive to light.  Neck:     Musculoskeletal: Normal range of motion and neck supple.  Cardiovascular:     Rate and Rhythm: Normal rate and regular rhythm.     Heart sounds: Normal heart sounds.  Pulmonary:     Effort: Pulmonary  effort is normal.     Breath sounds: Normal breath sounds.  Abdominal:     General: Bowel sounds are normal.     Palpations: Abdomen is soft.  Musculoskeletal:        General: No tenderness.  Skin:    General: Skin is warm and dry.  Neurological:     Mental Status: She is alert and oriented to person, place, and time.     Labs reviewed: Basic Metabolic Panel: Recent Labs    01/05/18 06/20/18 1612  NA 141  --   K 4.4  --   BUN 26*  --   CREATININE 0.6  --   TSH  --  2.06   Liver Function Tests: Recent Labs    01/05/18  AST 14  ALT 11  ALKPHOS 84   No results for input(s): LIPASE, AMYLASE in the last 8760 hours. No results for input(s): AMMONIA in the last 8760 hours. CBC: Recent Labs    03/24/18 1347 06/24/18 1417 09/23/18 1345  WBC 7.4 7.6 7.6  NEUTROABS 4.0 4.0 4.3  HGB 13.3 13.5 12.7  HCT 40.3 41.1 38.2  MCV 99.0 99.3 98.7  PLT 221 249 217   Lipid Panel: No results for input(s): CHOL, HDL, LDLCALC, TRIG, CHOLHDL, LDLDIRECT in the last 8760 hours. TSH: Recent Labs    06/20/18 1612  TSH 2.06   A1C: No results found for: HGBA1C   Assessment/Plan 1. Essential hypertension -stable, continues on cozaar and lopressor. - EKG 12-Lead- NSR rate 70 -pt request to get labs done with next lab draw from hematologist. Order given for CMP  2. Gastroesophageal reflux disease without esophagitis -stable on protonix  3. Neuropathy Ongoing but stable on gabapentin, does not like taking medication therefore will only use 1 daily   4. Age-related osteoporosis without current  pathological fracture -did not wish to be on Rx medication. To continue on cal and vit d.   5. Overactive bladder -stable on oxybutynin.   6. Anxiety and depression Coping with her husbands death appropriately. Doing well on effexor.   7. Hereditary hemochromatosis ([place]) Stable, continues to follow up with hematology for phlebotomy.   8. Osteoarthritis of multiple joints, unspecified  osteoarthritis type Ongoing, stable, has followed with ortho for injections and doing well at this time.     Next appt: 6 months, sooner if needed  Ariyana Faw K. [place], [facility] [phone]

## 2018-12-22 NOTE — Patient Instructions (Signed)
Follow up in 6 months with [facility]  To get CMP with next lab draw at [facility]

## 2019-01-27 ENCOUNTER — Other Ambulatory Visit: Payer: Self-pay

## 2019-01-27 ENCOUNTER — Inpatient Hospital Stay: Payer: Medicare HMO | Attending: Hematology

## 2019-01-27 ENCOUNTER — Inpatient Hospital Stay: Payer: Medicare HMO

## 2019-01-27 DIAGNOSIS — I10 Essential (primary) hypertension: Secondary | ICD-10-CM | POA: Diagnosis not present

## 2019-01-27 LAB — CBC WITH DIFFERENTIAL/PLATELET
Abs Immature Granulocytes: 0.01 10*3/uL (ref 0.00–0.07)
Basophils Absolute: 0 10*3/uL (ref 0.0–0.1)
Basophils Relative: 0 %
Eosinophils Absolute: 0.1 10*3/uL (ref 0.0–0.5)
Eosinophils Relative: 2 %
HCT: 38.5 % (ref 36.0–46.0)
Hemoglobin: 12.8 g/dL (ref 12.0–15.0)
Immature Granulocytes: 0 %
Lymphocytes Relative: 34 %
Lymphs Abs: 2.4 10*3/uL (ref 0.7–4.0)
MCH: 32.7 pg (ref 26.0–34.0)
MCHC: 33.2 g/dL (ref 30.0–36.0)
MCV: 98.5 fL (ref 80.0–100.0)
Monocytes Absolute: 0.7 10*3/uL (ref 0.1–1.0)
Monocytes Relative: 10 %
Neutro Abs: 3.7 10*3/uL (ref 1.7–7.7)
Neutrophils Relative %: 54 %
Platelets: 250 10*3/uL (ref 150–400)
RBC: 3.91 MIL/uL (ref 3.87–5.11)
RDW: 12.2 % (ref 11.5–15.5)
WBC: 7 10*3/uL (ref 4.0–10.5)
nRBC: 0 % (ref 0.0–0.2)

## 2019-01-27 LAB — HEPATIC FUNCTION PANEL
ALT: 11 (ref 7–35)
AST: 13 (ref 13–35)

## 2019-01-27 NOTE — Patient Instructions (Signed)

## 2019-01-28 LAB — AFP TUMOR MARKER: AFP, Serum, Tumor Marker: 0.9 ng/mL (ref 0.0–8.3)

## 2019-01-30 LAB — FERRITIN: Ferritin: 44 ng/mL (ref 11–307)

## 2019-01-30 LAB — IRON AND TIBC
Iron: 78 ug/dL (ref 41–142)
Saturation Ratios: 33 % (ref 21–57)
TIBC: 239 ug/dL (ref 236–444)
UIBC: 161 ug/dL (ref 120–384)

## 2019-01-31 ENCOUNTER — Telehealth: Payer: Self-pay | Admitting: *Deleted

## 2019-01-31 ENCOUNTER — Encounter: Payer: Self-pay | Admitting: *Deleted

## 2019-01-31 LAB — BASIC METABOLIC PANEL
BUN: 20 (ref 4–21)
Creatinine: 0.8 (ref 0.5–1.1)
Glucose: 100
Potassium: 4.3 (ref 3.4–5.3)
Sodium: 141 (ref 137–147)

## 2019-01-31 NOTE — Telephone Encounter (Signed)
Called pt and spoke with daughter [name].  Informed her of pt's lab results last week ; Dr. Burr Medico anticipates that pt will not need phlebotomy on her next visit in July - since pt was phlebotomized last week based on previous lab results. [name] voiced understanding.

## 2019-01-31 NOTE — Telephone Encounter (Signed)
-----   Message from Truitt Merle, MD sent at 01/31/2019 11:48 AM EDT ----- Please let pt know the lab results, her iron level was good last week. It seems she had phlebotomy last week (based on previous lab result), so I anticipate she does not need phlebotomy on next visit in July. Thanks  Truitt Merle  01/31/2019

## 2019-02-01 ENCOUNTER — Telehealth: Payer: Self-pay

## 2019-02-01 NOTE — Telephone Encounter (Signed)
Incoming fax received from [facility] (BMP).  Per Lauree Chandler, NP labs stable. Discussed results with patients daughter Ledon Snare verbalized understanding of results  Lab report sent to scanning center

## 2019-02-08 ENCOUNTER — Other Ambulatory Visit: Payer: Self-pay | Admitting: Nurse Practitioner

## 2019-02-08 DIAGNOSIS — F419 Anxiety disorder, unspecified: Principal | ICD-10-CM

## 2019-02-08 DIAGNOSIS — F329 Major depressive disorder, single episode, unspecified: Secondary | ICD-10-CM

## 2019-02-10 ENCOUNTER — Other Ambulatory Visit: Payer: Self-pay | Admitting: Nurse Practitioner

## 2019-02-10 DIAGNOSIS — K219 Gastro-esophageal reflux disease without esophagitis: Secondary | ICD-10-CM

## 2019-03-02 ENCOUNTER — Encounter: Payer: Self-pay | Admitting: Nurse Practitioner

## 2019-03-02 ENCOUNTER — Other Ambulatory Visit: Payer: Self-pay | Admitting: Nurse Practitioner

## 2019-03-02 DIAGNOSIS — I10 Essential (primary) hypertension: Secondary | ICD-10-CM

## 2019-03-03 ENCOUNTER — Telehealth: Payer: Self-pay

## 2019-03-03 MED ORDER — GABAPENTIN 300 MG PO CAPS: 300.0000 mg | ORAL_CAPSULE | Freq: Three times a day (TID) | ORAL | 0 refills | Status: DC

## 2019-03-13 NOTE — Telephone Encounter (Signed)
rx filled

## 2019-04-17 ENCOUNTER — Telehealth: Payer: Self-pay | Admitting: Hematology

## 2019-04-17 NOTE — Telephone Encounter (Signed)
Unable to reach pt per 7/05 sch message - left message for patient to call back to reschedule appt .

## 2019-04-20 ENCOUNTER — Telehealth: Payer: Self-pay | Admitting: Hematology

## 2019-04-20 NOTE — Telephone Encounter (Signed)
Spoke with patient and rescheduled appts based on a good day for the patient daughter.  Per MD okay to schedule for whatever day works best for the patient.  Patient daughter aware of the new appt date and time.

## 2019-04-28 ENCOUNTER — Other Ambulatory Visit: Payer: Medicare HMO

## 2019-04-28 ENCOUNTER — Ambulatory Visit: Payer: Medicare HMO | Admitting: Hematology

## 2019-05-08 NOTE — Progress Notes (Signed)
[facility]   [phone] [phone]   Clinic Follow up Note   Patient Care Team: Lauree Chandler, NP as PCP - General (Geriatric Medicine)  Date of Service:  05/12/2019  CHIEF COMPLAINT: F/u of Hereditary hemochromatosis   CURRENT THERAPY:  PRN phlebotomy to keep ferritin <=75, and transferrin saturation less than 75%  INTERVAL HISTORY:  Denise Lane is here for a follow up Hereditary hemochromatosis. She was last seen by me 7 months ago. She presents to the clinic alone. She notes she is doing well. She does go get groceries but will stay home otherwise. She notes having lower back pain. She ambulates with cane. She notes overall fatigue after activities. She tries to cope well since her husband died a few months ago. She lives by herself now with her cat. She get support from her daughter. I reviewed her med list with her. She feels she is still interested in phlebotomies.    REVIEW OF SYSTEMS:   Constitutional: Denies fevers, chills or abnormal weight loss (+) fatigue  Eyes: Denies blurriness of vision Ears, nose, mouth, throat, and face: Denies mucositis or sore throat Respiratory: Denies cough, dyspnea or wheezes Cardiovascular: Denies palpitation, chest discomfort or lower extremity swelling Gastrointestinal:  Denies nausea, heartburn or change in bowel habits Skin: Denies abnormal skin rashes MKS: (+) Lower back pain  Lymphatics: Denies new lymphadenopathy or easy bruising Neurological:Denies numbness, tingling or new weaknesses Behavioral/Psych: Mood is stable, no new changes  All other systems were reviewed with the patient and are negative.  MEDICAL HISTORY:  Past Medical History:  Diagnosis Date  . Basal cell cancer    "face, legs" (10/18/2012)  . Bruises easily   . Chronic lower back pain   . Depression    takes Effexor daily  . Diverticulosis of sigmoid colon 11-20-2009  . Dry eyes    uses Refresh eye drops daily as  needed  . Dysphagia   . Gastric ulcer   . GERD (gastroesophageal reflux disease)    takes Omeprazole daily  . H/O hiatal hernia   . Hemochromatosis 1987  . Hemochromatosis   . Hemorrhoids, internal 11-20-09  . History of blood transfusion    "S/P colonoscopy after polyp removed; got 2 units; esophageal bleed got 1 unit; really messed up hemochromatosis" (1/7/20214)  . History of bronchitis 1991-1996   "chronic; related to winter" (10/18/2012)  . History of colonic polyps    NOTED 11-20-09 IN COLONOSCOPY REPORT  . Hyperlipidemia    no meds required  . Hypertension    takes Metoprolol and Losartan daily  . Joint pain   . Joint swelling   . Muscle pain   . Muscle spasm    takes Robaxin daily as needed  . Occasional tremors   . Osteoarthritis   . Osteoarthritis of right hip 10/18/2012  . Osteoporosis   . Overactive bladder    takes Myrbetriq daily  . PONV (postoperative nausea and vomiting)   . Rheumatic fever    hx of  . Squamous carcinoma    "LLE" (10/18/2012)  . Stenosis of esophagus   . Tremor, essential 10-29-09  . Urinary urgency    takes Ditropan daily  . Vomiting    occasionally    SURGICAL HISTORY: Past Surgical History:  Procedure Laterality Date  . APPENDECTOMY  1952  . CATARACT EXTRACTION W/ INTRAOCULAR LENS  IMPLANT, BILATERAL  2001   "both eyes" (10/18/2012)  . CHOLECYSTECTOMY  1977  . COLONOSCOPY    .  DILATION AND CURETTAGE OF UTERUS  1975  . ESOPHAGOGASTRODUODENOSCOPY    . [place]  . SHOULDER ARTHROSCOPY W/ ROTATOR CUFF REPAIR  1998?   "left" (10/18/2012)  . SKIN CANCER EXCISION     "multiple" (10/18/2012)  . TONSILLECTOMY  1953  . TOTAL HIP ARTHROPLASTY  10/18/2012   "right" (10/18/2012)  . TOTAL HIP ARTHROPLASTY  10/18/2012   Procedure: TOTAL HIP ARTHROPLASTY;  Surgeon: Johnny Bridge, MD;  Location: [facility];  Service: Orthopedics;  Laterality: Right;  . TOTAL HIP ARTHROPLASTY Left 07/24/2014   dr Mardelle Matte  . TOTAL HIP ARTHROPLASTY Left  07/24/2014   Procedure: LEFT TOTAL HIP ARTHROPLASTY;  Surgeon: Johnny Bridge, MD;  Location: [place] [facility];  Service: Orthopedics;  Laterality: Left;  . TUBAL LIGATION  1975    I have reviewed the social history and family history with the patient and they are unchanged from previous note.  ALLERGIES:  is allergic to codeine; dilaudid [hydromorphone hcl]; macrodantin; morphine and related; sulfa antibiotics; and ibuprofen.  MEDICATIONS:  Current Outpatient Medications  Medication Sig Dispense Refill  . acetaminophen (TYLENOL) 650 MG CR tablet Take 650 mg by mouth 2 (two) times daily.     . calcium citrate (CALCITRATE - DOSED IN MG ELEMENTAL CALCIUM) 950 MG tablet Take 1 tablet by mouth daily.     . carboxymethylcellulose (REFRESH PLUS) 0.5 % SOLN Place 1 drop into both eyes 3 (three) times daily as needed. For dry eyes    . cholecalciferol (VITAMIN D) 1000 UNITS tablet Take 1,000 Units by mouth 2 (two) times daily.    Marland Kitchen gabapentin (NEURONTIN) 300 MG capsule Take 1 capsule (300 mg total) by mouth 3 (three) times daily. 90 capsule 0  . Krill Oil 350 MG CAPS Take 1 capsule by mouth daily.    Marland Kitchen losartan (COZAAR) 50 MG tablet TAKE 1 TABLET EVERY DAY 90 tablet 3  . metoprolol tartrate (LOPRESSOR) 25 MG tablet TAKE 1 TABLET EVERY DAY 90 tablet 2  . oxybutynin (DITROPAN) 5 MG tablet TAKE 1 TABLET TWICE DAILY (SUBSTITUTED FOR DITROPAN) 180 tablet 1  . pantoprazole (PROTONIX) 40 MG tablet TAKE 1 TABLET (40 MG TOTAL) BY MOUTH DAILY. 90 tablet 3  . UNABLE TO FIND CMP, DX I10 (hypertension), fax results to [phone] 1 each 0  . venlafaxine XR (EFFEXOR-XR) 37.5 MG 24 hr capsule TAKE 1 CAPSULE EVERY DAY WITH BREAKFAST 90 capsule 1   No current facility-administered medications for this visit.     PHYSICAL EXAMINATION: ECOG PERFORMANCE STATUS: 1 - Symptomatic but completely ambulatory  Vitals:   05/12/19 1049  BP: (!) 151/60  Pulse: 64  Resp: 18  Temp: 98.2 F (36.8 C)  SpO2: 98%   Filed Weights    05/12/19 1049  Weight: 124 lb 12.8 oz (56.6 kg)    GENERAL:alert, no distress and comfortable SKIN: skin color, texture, turgor are normal, no rashes or significant lesions EYES: normal, Conjunctiva are pink and non-injected, sclera clear  NECK: supple, thyroid normal size, non-tender, without nodularity LYMPH:  no palpable lymphadenopathy in the cervical, axillary  LUNGS: clear to auscultation and percussion with normal breathing effort HEART: regular rate & rhythm and no murmurs and no lower extremity edema ABDOMEN:abdomen soft, non-tender and normal bowel sounds Musculoskeletal:no cyanosis of digits and no clubbing  NEURO: alert & oriented x 3 with fluent speech, no focal motor/sensory deficits  LABORATORY DATA:  I have reviewed the data as listed CBC Latest Ref Rng & Units 05/12/2019 01/27/2019  09/23/2018  WBC 4.0 - 10.5 K/uL 7.3 7.0 7.6  Hemoglobin 12.0 - 15.0 g/dL 12.5 12.8 12.7  Hematocrit 36.0 - 46.0 % 38.5 38.5 38.2  Platelets 150 - 400 K/uL 250 250 217     CMP Latest Ref Rng & Units 01/27/2019 01/05/2018 06/04/2017  Glucose 65 - 99 mg/dL - - 129(H)  BUN 4 - 21 20 26(A) 27  Creatinine 0.5 - 1.1 0.8 0.6 0.72  Sodium 137 - 147 141 141 140  Potassium 3.4 - 5.3 4.3 4.4 4.3  Chloride 96 - 106 mmol/L - - 100  CO2 20 - 29 mmol/L - - 23  Calcium 8.7 - 10.3 mg/dL - - 9.6  Total Protein 6.0 - 8.5 g/dL - - 6.2  Total Bilirubin 0.0 - 1.2 mg/dL - - 0.3  Alkaline Phos 25 - 125 - 84 76  AST 13 - 35 13 14 18   ALT 7 - 35 11 11 14       RADIOGRAPHIC STUDIES: I have personally reviewed the radiological images as listed and agreed with the findings in the report. No results found.   ASSESSMENT & PLAN:  Denise Lane is a 85 y.o. female with   1. Hemachromatosis: -She is currently being treated with as needed phlebotomies every 3 months. She is tolerating well. -If her ferritin >75or transferrin saturation>75%, I will recommend phlebotomy, I'll do 0.75 unit with normal  saline 500 mL infusion. Her iron study result is usually not available during her visit, and OK to do Phlebotomy based on her last lab results. -Her 06/2018 US abdomen was unremarkable.  I discussed with patient. -Last Phlebotomy on 01/27/19. Tolerated well. She is interested in continuing.  -She is clinically stable. She does feel more fatigues as she gets older. Since her husband passed a few month ago she lives alone. She tries to continue to cope well with help from her daughter. I encouraged her to remain active.  -Labs reviewed, CBC WNL, Iron panel is still pending. Based on previous lab 3 months ago she does not need phlebotomy today.  -Given her age, I will do phlebotomy less frequently. Will reduce labs to every 6 months  -F/u in 12 months    2. Osteoporosis -Followed by Dr. Dewaine Oats   3. Neuropathy in feet, low back pain  -She ambulates with cane.  -I encouraged her to watch for fall.  -I encouraged her to continue walking and being active at home.    PLAN -No phlebotomy today -Lab, phlebotomy in 6 and 12 months -F/u in 12 months    No problem-specific Assessment & Plan notes found for this encounter.   No orders of the defined types were placed in this encounter.  All questions were answered. The patient knows to call the clinic with any problems, questions or concerns. No barriers to learning was detected. I spent 10 minutes counseling the patient face to face. The total time spent in the appointment was 15 minutes and more than 50% was on counseling and review of test results     Truitt Merle, MD 05/12/2019   I, Joslyn Devon, am acting as scribe for Truitt Merle, MD.   I have reviewed the above documentation for accuracy and completeness, and I agree with the above.

## 2019-05-12 ENCOUNTER — Other Ambulatory Visit: Payer: Self-pay

## 2019-05-12 ENCOUNTER — Inpatient Hospital Stay (HOSPITAL_BASED_OUTPATIENT_CLINIC_OR_DEPARTMENT_OTHER): Payer: Medicare HMO | Admitting: Hematology

## 2019-05-12 ENCOUNTER — Inpatient Hospital Stay: Payer: Medicare HMO | Attending: Hematology

## 2019-05-12 ENCOUNTER — Inpatient Hospital Stay: Payer: Medicare HMO

## 2019-05-12 DIAGNOSIS — M810 Age-related osteoporosis without current pathological fracture: Secondary | ICD-10-CM | POA: Diagnosis not present

## 2019-05-12 DIAGNOSIS — Z79899 Other long term (current) drug therapy: Secondary | ICD-10-CM

## 2019-05-12 DIAGNOSIS — G629 Polyneuropathy, unspecified: Secondary | ICD-10-CM | POA: Diagnosis not present

## 2019-05-12 DIAGNOSIS — E785 Hyperlipidemia, unspecified: Secondary | ICD-10-CM | POA: Insufficient documentation

## 2019-05-12 DIAGNOSIS — M545 Low back pain: Secondary | ICD-10-CM | POA: Insufficient documentation

## 2019-05-12 DIAGNOSIS — I10 Essential (primary) hypertension: Secondary | ICD-10-CM | POA: Insufficient documentation

## 2019-05-12 DIAGNOSIS — I110 Hypertensive heart disease with heart failure: Secondary | ICD-10-CM

## 2019-05-12 DIAGNOSIS — R5383 Other fatigue: Secondary | ICD-10-CM

## 2019-05-12 DIAGNOSIS — K219 Gastro-esophageal reflux disease without esophagitis: Secondary | ICD-10-CM | POA: Diagnosis not present

## 2019-05-12 LAB — CBC WITH DIFFERENTIAL/PLATELET
Abs Immature Granulocytes: 0.01 10*3/uL (ref 0.00–0.07)
Basophils Absolute: 0 10*3/uL (ref 0.0–0.1)
Basophils Relative: 0 %
Eosinophils Absolute: 0.2 10*3/uL (ref 0.0–0.5)
Eosinophils Relative: 2 %
HCT: 38.5 % (ref 36.0–46.0)
Hemoglobin: 12.5 g/dL (ref 12.0–15.0)
Immature Granulocytes: 0 %
Lymphocytes Relative: 30 %
Lymphs Abs: 2.2 10*3/uL (ref 0.7–4.0)
MCH: 31.5 pg (ref 26.0–34.0)
MCHC: 32.5 g/dL (ref 30.0–36.0)
MCV: 97 fL (ref 80.0–100.0)
Monocytes Absolute: 0.7 10*3/uL (ref 0.1–1.0)
Monocytes Relative: 10 %
Neutro Abs: 4.2 10*3/uL (ref 1.7–7.7)
Neutrophils Relative %: 58 %
Platelets: 250 10*3/uL (ref 150–400)
RBC: 3.97 MIL/uL (ref 3.87–5.11)
RDW: 12.1 % (ref 11.5–15.5)
WBC: 7.3 10*3/uL (ref 4.0–10.5)
nRBC: 0 % (ref 0.0–0.2)

## 2019-05-12 LAB — IRON AND TIBC
Iron: 104 ug/dL (ref 41–142)
Saturation Ratios: 41 % (ref 21–57)
TIBC: 258 ug/dL (ref 236–444)
UIBC: 153 ug/dL (ref 120–384)

## 2019-05-12 LAB — FERRITIN: Ferritin: 45 ng/mL (ref 11–307)

## 2019-05-13 ENCOUNTER — Encounter: Payer: Self-pay | Admitting: Hematology

## 2019-05-15 ENCOUNTER — Telehealth: Payer: Self-pay | Admitting: Hematology

## 2019-05-15 NOTE — Telephone Encounter (Signed)
Scheduled appt per 7/31 los.  Spoke with patient daughter and she is aware of the appt date and time.

## 2019-05-17 ENCOUNTER — Telehealth: Payer: Self-pay

## 2019-05-17 NOTE — Telephone Encounter (Signed)
-----   Message from Truitt Merle, MD sent at 05/17/2019  7:08 AM EDT ----- Please let pt or her daughter know her iron level was good last week, no need phlebotomy on next visit in 6 months (please cancel it), thanks   Truitt Merle  05/17/2019

## 2019-05-17 NOTE — Telephone Encounter (Signed)
Spoke with patient's daughter Foye Clock regarding lab results.  Per Dr. Burr Medico iron level was good last week, no need for phlebotomy on next visit in 6 months - will cancel it.  She verbalized an understanding.

## 2019-06-03 ENCOUNTER — Other Ambulatory Visit: Payer: Self-pay | Admitting: Nurse Practitioner

## 2019-06-21 ENCOUNTER — Other Ambulatory Visit: Payer: Self-pay | Admitting: Family

## 2019-06-23 ENCOUNTER — Ambulatory Visit: Payer: Medicare HMO | Admitting: Nurse Practitioner

## 2019-06-23 ENCOUNTER — Encounter: Payer: Self-pay | Admitting: Nurse Practitioner

## 2019-06-23 ENCOUNTER — Ambulatory Visit: Payer: Self-pay

## 2019-06-27 ENCOUNTER — Encounter: Payer: Self-pay | Admitting: Nurse Practitioner

## 2019-06-27 ENCOUNTER — Other Ambulatory Visit: Payer: Self-pay

## 2019-06-27 ENCOUNTER — Ambulatory Visit (INDEPENDENT_AMBULATORY_CARE_PROVIDER_SITE_OTHER): Payer: Medicare HMO | Admitting: Nurse Practitioner

## 2019-06-27 ENCOUNTER — Ambulatory Visit: Payer: Medicare HMO | Admitting: Nurse Practitioner

## 2019-06-27 VITALS — Ht 66.0 in | Wt 124.8 lb

## 2019-06-27 DIAGNOSIS — Z0000 Encounter for general adult medical examination without abnormal findings: Secondary | ICD-10-CM

## 2019-06-27 NOTE — Patient Instructions (Signed)
Denise Lane , Thank you for taking time to come for your Medicare Wellness Visit. I appreciate your ongoing commitment to your health goals. Please review the following plan we discussed and let me know if I can assist you in the future.   Screening recommendations/referrals: Colonoscopy aged out Mammogram aged out Bone Density: declined  Recommended yearly ophthalmology/optometry visit for glaucoma screening and checkup Recommended yearly dental visit for hygiene and checkup  Vaccinations: Influenza vaccine- to get at next appt Pneumococcal vaccine up to date Tdap vaccine up to date Shingles vaccine up to date    Advanced directives: on file  Conditions/risks identified: fall risk  Next appointment: 1 year for AWV   Preventive Care 39 Years and Older, Female Preventive care refers to lifestyle choices and visits with your health care provider that can promote health and wellness. What does preventive care include?  A yearly physical exam. This is also called an annual well check.  Dental exams once or twice a year.  Routine eye exams. Ask your health care provider how often you should have your eyes checked.  Personal lifestyle choices, including:  Daily care of your teeth and gums.  Regular physical activity.  Eating a healthy diet.  Avoiding tobacco and drug use.  Limiting alcohol use.  Practicing safe sex.  Taking low-dose aspirin every day.  Taking vitamin and mineral supplements as recommended by your health care provider. What happens during an annual well check? The services and screenings done by your health care provider during your annual well check will depend on your age, overall health, lifestyle risk factors, and family history of disease. Counseling  Your health care provider may ask you questions about your:  Alcohol use.  Tobacco use.  Drug use.  Emotional well-being.  Home and relationship well-being.  Sexual activity.  Eating  habits.  History of falls.  Memory and ability to understand (cognition).  Work and work Statistician.  Reproductive health. Screening  You may have the following tests or measurements:  Height, weight, and BMI.  Blood pressure.  Lipid and cholesterol levels. These may be checked every 5 years, or more frequently if you are over 2 years old.  Skin check.  Lung cancer screening. You may have this screening every year starting at age 33 if you have a 30-pack-year history of smoking and currently smoke or have quit within the past 15 years.  Fecal occult blood test (FOBT) of the stool. You may have this test every year starting at age 31.  Flexible sigmoidoscopy or colonoscopy. You may have a sigmoidoscopy every 5 years or a colonoscopy every 10 years starting at age 55.  Hepatitis C blood test.  Hepatitis B blood test.  Sexually transmitted disease (STD) testing.  Diabetes screening. This is done by checking your blood sugar (glucose) after you have not eaten for a while (fasting). You may have this done every 1-3 years.  Bone density scan. This is done to screen for osteoporosis. You may have this done starting at age 27.  Mammogram. This may be done every 1-2 years. Talk to your health care provider about how often you should have regular mammograms. Talk with your health care provider about your test results, treatment options, and if necessary, the need for more tests. Vaccines  Your health care provider may recommend certain vaccines, such as:  Influenza vaccine. This is recommended every year.  Tetanus, diphtheria, and acellular pertussis (Tdap, Td) vaccine. You may need a Td booster every 10  years.  Zoster vaccine. You may need this after age 93.  Pneumococcal 13-valent conjugate (PCV13) vaccine. One dose is recommended after age 62.  Pneumococcal polysaccharide (PPSV23) vaccine. One dose is recommended after age 75. Talk to your health care provider about which  screenings and vaccines you need and how often you need them. This information is not intended to replace advice given to you by your health care provider. Make sure you discuss any questions you have with your health care provider. Document Released: 10/25/2015 Document Revised: 06/17/2016 Document Reviewed: 07/30/2015 Elsevier Interactive Patient Education  2017 [place] Prevention in the Home Falls can cause injuries. They can happen to people of all ages. There are many things you can do to make your home safe and to help prevent falls. What can I do on the outside of my home?  Regularly fix the edges of walkways and driveways and fix any cracks.  Remove anything that might make you trip as you walk through a door, such as a raised step or threshold.  Trim any bushes or trees on the path to your home.  Use bright outdoor lighting.  Clear any walking paths of anything that might make someone trip, such as rocks or tools.  Regularly check to see if handrails are loose or broken. Make sure that both sides of any steps have handrails.  Any raised decks and porches should have guardrails on the edges.  Have any leaves, snow, or ice cleared regularly.  Use sand or salt on walking paths during winter.  Clean up any spills in your garage right away. This includes oil or grease spills. What can I do in the bathroom?  Use night lights.  Install grab bars by the toilet and in the tub and shower. Do not use towel bars as grab bars.  Use non-skid mats or decals in the tub or shower.  If you need to sit down in the shower, use a plastic, non-slip stool.  Keep the floor dry. Clean up any water that spills on the floor as soon as it happens.  Remove soap buildup in the tub or shower regularly.  Attach bath mats securely with double-sided non-slip rug tape.  Do not have throw rugs and other things on the floor that can make you trip. What can I do in the bedroom?  Use  night lights.  Make sure that you have a light by your bed that is easy to reach.  Do not use any sheets or blankets that are too big for your bed. They should not hang down onto the floor.  Have a firm chair that has side arms. You can use this for support while you get dressed.  Do not have throw rugs and other things on the floor that can make you trip. What can I do in the kitchen?  Clean up any spills right away.  Avoid walking on wet floors.  Keep items that you use a lot in easy-to-reach places.  If you need to reach something above you, use a strong step stool that has a grab bar.  Keep electrical cords out of the way.  Do not use floor polish or wax that makes floors slippery. If you must use wax, use non-skid floor wax.  Do not have throw rugs and other things on the floor that can make you trip. What can I do with my stairs?  Do not leave any items on the stairs.  Make sure that  there are handrails on both sides of the stairs and use them. Fix handrails that are broken or loose. Make sure that handrails are as long as the stairways.  Check any carpeting to make sure that it is firmly attached to the stairs. Fix any carpet that is loose or worn.  Avoid having throw rugs at the top or bottom of the stairs. If you do have throw rugs, attach them to the floor with carpet tape.  Make sure that you have a light switch at the top of the stairs and the bottom of the stairs. If you do not have them, ask someone to add them for you. What else can I do to help prevent falls?  Wear shoes that:  Do not have high heels.  Have rubber bottoms.  Are comfortable and fit you well.  Are closed at the toe. Do not wear sandals.  If you use a stepladder:  Make sure that it is fully opened. Do not climb a closed stepladder.  Make sure that both sides of the stepladder are locked into place.  Ask someone to hold it for you, if possible.  Clearly mark and make sure that you  can see:  Any grab bars or handrails.  First and last steps.  Where the edge of each step is.  Use tools that help you move around (mobility aids) if they are needed. These include:  Canes.  Walkers.  Scooters.  Crutches.  Turn on the lights when you go into a dark area. Replace any light bulbs as soon as they burn out.  Set up your furniture so you have a clear path. Avoid moving your furniture around.  If any of your floors are uneven, fix them.  If there are any pets around you, be aware of where they are.  Review your medicines with your doctor. Some medicines can make you feel dizzy. This can increase your chance of falling. Ask your doctor what other things that you can do to help prevent falls. This information is not intended to replace advice given to you by your health care provider. Make sure you discuss any questions you have with your health care provider. Document Released: 07/25/2009 Document Revised: 03/05/2016 Document Reviewed: 11/02/2014 Elsevier Interactive Patient Education  2017 [employer].

## 2019-06-27 NOTE — Progress Notes (Signed)
Subjective:   Denise Lane is a 85 y.o. female who presents for Medicare Annual (Subsequent) preventive examination.  Review of Systems:   Cardiac Risk Factors include: advanced age (>36men, >14 women);family history of premature cardiovascular disease;hypertension     Objective:     Vitals: Ht 5\' 6"  (1.676 m)   Wt 124 lb 12.8 oz (56.6 kg)   BMI 20.14 kg/m   Body mass index is 20.14 kg/m.  Advanced Directives 06/27/2019 06/27/2019 12/22/2018 06/20/2018 05/20/2017 02/05/2017 11/19/2016  Does Patient Have a Medical Advance Directive? Yes Yes Yes Yes Yes Yes No;Yes  Type of Advance Directive Living will Living will [place];Living will [place];Living will [place];Living will [place];Living will [place]  Does patient want to make changes to medical advance directive? No - Patient declined - No - Patient declined No - Patient declined - - -  Copy of [place] in Chart? - - Yes - validated most recent copy scanned in chart (See row information) Yes Yes No - copy requested No - copy requested  Would patient like information on creating a medical advance directive? - - - - - - No - Patient declined  Pre-existing out of facility DNR order (yellow form or pink MOST form) - - - - - - -    Tobacco Social History   Tobacco Use  Smoking Status Never Smoker  Smokeless Tobacco Never Used     Counseling given: Not Answered   Clinical Intake:  Pre-visit preparation completed: Yes  Pain : No/denies pain     BMI - recorded: 20.14 Nutritional Status: BMI of 19-24  Normal Nutritional Risks: None Diabetes: No  How often do you need to have someone help you when you read instructions, pamphlets, or other written materials from your doctor or pharmacy?: 3 - Sometimes What is the last grade level you completed in school?: 12th grade and trade school.  Interpreter  Needed?: No     Past Medical History:  Diagnosis Date  . Basal cell cancer    "face, legs" (10/18/2012)  . Bruises easily   . Chronic lower back pain   . Depression    takes Effexor daily  . Diverticulosis of sigmoid colon 11-20-2009  . Dry eyes    uses Refresh eye drops daily as needed  . Dysphagia   . Gastric ulcer   . GERD (gastroesophageal reflux disease)    takes Omeprazole daily  . H/O hiatal hernia   . Hemochromatosis 1987  . Hemochromatosis   . Hemorrhoids, internal 11-20-09  . History of blood transfusion    "S/P colonoscopy after polyp removed; got 2 units; esophageal bleed got 1 unit; really messed up hemochromatosis" (1/7/20214)  . History of bronchitis 1991-1996   "chronic; related to winter" (10/18/2012)  . History of colonic polyps    NOTED 11-20-09 IN COLONOSCOPY REPORT  . Hyperlipidemia    no meds required  . Hypertension    takes Metoprolol and Losartan daily  . Joint pain   . Joint swelling   . Muscle pain   . Muscle spasm    takes Robaxin daily as needed  . Occasional tremors   . Osteoarthritis   . Osteoarthritis of right hip 10/18/2012  . Osteoporosis   . Overactive bladder    takes Myrbetriq daily  . PONV (postoperative nausea and vomiting)   . Rheumatic fever    hx of  . Squamous  carcinoma    "LLE" (10/18/2012)  . Stenosis of esophagus   . Tremor, essential 10-29-09  . Urinary urgency    takes Ditropan daily  . Vomiting    occasionally   Past Surgical History:  Procedure Laterality Date  . APPENDECTOMY  1952  . CATARACT EXTRACTION W/ INTRAOCULAR LENS  IMPLANT, BILATERAL  2001   "both eyes" (10/18/2012)  . CHOLECYSTECTOMY  1977  . COLONOSCOPY    . DILATION AND CURETTAGE OF UTERUS  1975  . ESOPHAGOGASTRODUODENOSCOPY    . [place]  . SHOULDER ARTHROSCOPY W/ ROTATOR CUFF REPAIR  1998?   "left" (10/18/2012)  . SKIN CANCER EXCISION     "multiple" (10/18/2012)  . TONSILLECTOMY  1953  . TOTAL HIP ARTHROPLASTY  10/18/2012   "right"  (10/18/2012)  . TOTAL HIP ARTHROPLASTY  10/18/2012   Procedure: TOTAL HIP ARTHROPLASTY;  Surgeon: Johnny Bridge, MD;  Location: [place] [facility];  Service: Orthopedics;  Laterality: Right;  . TOTAL HIP ARTHROPLASTY Left 07/24/2014   dr Mardelle Matte  . TOTAL HIP ARTHROPLASTY Left 07/24/2014   Procedure: LEFT TOTAL HIP ARTHROPLASTY;  Surgeon: Johnny Bridge, MD;  Location: [facility];  Service: Orthopedics;  Laterality: Left;  . TUBAL LIGATION  1975   Family History  Problem Relation Age of Onset  . Heart disease Mother   . Heart disease Father   . Hyperlipidemia Daughter   . Heart disease Brother   . Heart disease Brother   . Heart disease Brother   . Heart disease Brother   . Cancer Sister   . Melanoma Sister   . Alzheimer's disease Sister   . Heart disease Sister   . Osteoporosis Sister    Social History   Socioeconomic History  . Marital status: Widowed    Spouse name: Not on file  . Number of children: Not on file  . Years of education: Not on file  . Highest education level: Not on file  Occupational History  . Not on file  Social Needs  . Financial resource strain: Not hard at all  . Food insecurity    Worry: Never true    Inability: Never true  . Transportation needs    Medical: No    Non-medical: No  Tobacco Use  . Smoking status: Never Smoker  . Smokeless tobacco: Never Used  Substance and Sexual Activity  . Alcohol use: No  . Drug use: No  . Sexual activity: Never    Birth control/protection: Post-menopausal  Lifestyle  . Physical activity    Days per week: 0 days    Minutes per session: 0 min  . Stress: Only a little  Relationships  . Social Herbalist on phone: Three times a week    Gets together: Once a week    Attends religious service: Never    Active member of club or organization: No    Attends meetings of clubs or organizations: Never    Relationship status: Widowed  Other Topics Concern  . Not on file  Social History Narrative  . Not on file     Outpatient Encounter Medications as of 06/27/2019  Medication Sig  . acetaminophen (TYLENOL) 650 MG CR tablet Take 650 mg by mouth 2 (two) times daily.   . calcium citrate (CALCITRATE - DOSED IN MG ELEMENTAL CALCIUM) 950 MG tablet Take 1 tablet by mouth daily.   . carboxymethylcellulose (REFRESH PLUS) 0.5 % SOLN Place 1 drop into both eyes 3 (three) times  daily as needed. Uses more frequently in left eye as needed.  . cholecalciferol (VITAMIN D) 1000 UNITS tablet Take 1,000 Units by mouth 2 (two) times daily.  Marland Kitchen gabapentin (NEURONTIN) 300 MG capsule Take 300 mg by mouth daily.  Javier Docker Oil 350 MG CAPS Take 1 capsule by mouth daily.  Marland Kitchen losartan (COZAAR) 50 MG tablet TAKE 1 TABLET EVERY DAY  . metoprolol tartrate (LOPRESSOR) 25 MG tablet TAKE 1 TABLET EVERY DAY  . oxybutynin (DITROPAN) 5 MG tablet TAKE 1 TABLET TWICE DAILY (SUBSTITUTED FOR DITROPAN)  . pantoprazole (PROTONIX) 40 MG tablet TAKE 1 TABLET (40 MG TOTAL) BY MOUTH DAILY.  Marland Kitchen venlafaxine XR (EFFEXOR-XR) 37.5 MG 24 hr capsule TAKE 1 CAPSULE EVERY DAY WITH BREAKFAST  . [DISCONTINUED] gabapentin (NEURONTIN) 300 MG capsule TAKE 1 CAPSULE (300 MG TOTAL) BY MOUTH 3 (THREE) TIMES DAILY.  . [DISCONTINUED] UNABLE TO FIND CMP, DX I10 (hypertension), fax results to [phone]   No facility-administered encounter medications on file as of 06/27/2019.     Activities of Daily Living In your present state of health, do you have any difficulty performing the following activities: 06/27/2019  Hearing? N  Vision? N  Difficulty concentrating or making decisions? Y  Walking or climbing stairs? Y  Comment uses cane  Dressing or bathing? N  Doing errands, shopping? Y  Comment daughter helps  Conservation officer, nature and eating ? Y  Comment uses prepared dinners mostly  Using the Toilet? N  In the past six months, have you accidently leaked urine? Y  Do you have problems with loss of bowel control? N  Managing your Medications? N  Managing your Finances? Y   Comment daughter helps  Housekeeping or managing your Housekeeping? Y  Some recent data might be hidden    Patient Care Team: Lauree Chandler, NP as PCP - General (Geriatric Medicine)    Assessment:   This is a routine wellness examination for Tyrina.  Exercise Activities and Dietary recommendations Current Exercise Habits: Home exercise routine, Type of exercise: walking, Time (Minutes): 15, Frequency (Times/Week): 7, Weekly Exercise (Minutes/Week): 105, Intensity: Mild, Exercise limited by: orthopedic condition(s)  Goals    . Gain weight     Starting 11/19/16, I will attempt to increase my weight by 10-15 lbs. I would like to weight around 130-135 lb.        Fall Risk Fall Risk  06/27/2019 06/20/2018 12/02/2017 05/20/2017 11/19/2016  Falls in the past year? 0 No Yes No No  Comment - - Pt fell backward while placing pine needles under ramp at home about 3 weeks ago.  - -  Number falls in past yr: - - 1 - -  Injury with Fall? - - No - -  Risk for fall due to : - - - - History of fall(s)   Is the patient's home free of loose throw rugs in walkways, pet beds, electrical cords, etc?   No, has throw rugs but aware of them      Grab bars in the bathroom? yes      Handrails on the stairs?   yes      Adequate lighting?   yes   Timed Get Up and Go performed: na  Depression Screen PHQ 2/9 Scores 06/27/2019 06/20/2018 12/02/2017 11/19/2016  PHQ - 2 Score 0 0 0 0     Cognitive Function MMSE - Mini Mental State Exam 06/20/2018 11/19/2016  Orientation to time 4 5  Orientation to Place 5 5  Registration 3  3  Attention/ Calculation 5 5  Recall 0 3  Language- name 2 objects 2 2  Language- repeat 1 1  Language- follow 3 step command 3 3  Language- read & follow direction 1 1  Write a sentence 1 1  Copy design 1 1  Total score 26 30     6CIT Screen 06/27/2019  What Year? 0 points  What month? 0 points  What time? 0 points  Count back from 20 0 points  Months in reverse 0 points  Repeat  phrase 0 points  Total Score 0    Immunization History  Administered Date(s) Administered  . Influenza, High Dose Seasonal PF 07/08/2016, 07/18/2017, 06/20/2018  . Influenza-Unspecified 06/12/2012, 07/04/2013, 06/23/2014, 07/09/2015  . Pneumococcal Conjugate-13 05/14/2016  . Pneumococcal Polysaccharide-23 07/25/2014  . Td 04/19/2007  . Tdap 05/27/2013  . Zoster 09/24/2011  . Zoster Recombinat (Shingrix) 05/24/2017, 08/23/2017    Qualifies for Shingles Vaccine? done  Screening Tests Health Maintenance  Topic Date Due  . INFLUENZA VACCINE  05/13/2019  . MAMMOGRAM  06/26/2020 (Originally 02/19/2018)  . TETANUS/TDAP  05/28/2023  . DEXA SCAN  Completed  . PNA vac Low Risk Adult  Completed    Cancer Screenings: Lung: Low Dose CT Chest recommended if Age 80-80 years, 30 pack-year currently smoking OR have quit w/in 15years. Patient does not qualify. Breast:  Up to date on Mammogram? Yes  Aged out Up to date of Bone Density/Dexa? No declines Colorectal: aged out  Additional Screenings:  Hepatitis C Screening: na     Plan:      I have personally reviewed and noted the following in the patient's chart:   . Medical and social history . Use of alcohol, tobacco or illicit drugs  . Current medications and supplements . Functional ability and status . Nutritional status . Physical activity . Advanced directives . List of other physicians . Hospitalizations, surgeries, and ER visits in previous 12 months . Vitals . Screenings to include cognitive, depression, and falls . Referrals and appointments  In addition, I have reviewed and discussed with patient certain preventive protocols, quality metrics, and best practice recommendations. A written personalized care plan for preventive services as well as general preventive health recommendations were provided to patient.     Lauree Chandler, NP  06/27/2019

## 2019-06-27 NOTE — Progress Notes (Signed)
    This service is provided via telemedicine  No vital signs collected/recorded due to the encounter was a telemedicine visit.   Location of patient (ex: home, work):  Home  Patient consents to a telephone visit:  Yes  Location of the provider (ex: office, home):  Office.  Name of any referring provider:  Sherrie Mustache, NP  Names of all persons participating in the telemedicine service and their role in the encounter:  Sherrie Mustache, NP; Bonney Leitz, [place]; Akiya Truett.  Time spent on call:  16 minutes - CMA time only.

## 2019-07-03 ENCOUNTER — Ambulatory Visit (INDEPENDENT_AMBULATORY_CARE_PROVIDER_SITE_OTHER): Payer: Medicare HMO | Admitting: Nurse Practitioner

## 2019-07-03 ENCOUNTER — Other Ambulatory Visit: Payer: Self-pay

## 2019-07-03 ENCOUNTER — Encounter: Payer: Self-pay | Admitting: Nurse Practitioner

## 2019-07-03 VITALS — BP 130/72 | HR 62 | Temp 98.2°F | Ht 66.0 in | Wt 124.0 lb

## 2019-07-03 DIAGNOSIS — F329 Major depressive disorder, single episode, unspecified: Secondary | ICD-10-CM

## 2019-07-03 DIAGNOSIS — F32A Depression, unspecified: Secondary | ICD-10-CM

## 2019-07-03 DIAGNOSIS — N3281 Overactive bladder: Secondary | ICD-10-CM

## 2019-07-03 DIAGNOSIS — K219 Gastro-esophageal reflux disease without esophagitis: Secondary | ICD-10-CM | POA: Diagnosis not present

## 2019-07-03 DIAGNOSIS — G629 Polyneuropathy, unspecified: Secondary | ICD-10-CM

## 2019-07-03 DIAGNOSIS — F419 Anxiety disorder, unspecified: Secondary | ICD-10-CM | POA: Diagnosis not present

## 2019-07-03 DIAGNOSIS — M810 Age-related osteoporosis without current pathological fracture: Secondary | ICD-10-CM

## 2019-07-03 DIAGNOSIS — M159 Polyosteoarthritis, unspecified: Secondary | ICD-10-CM | POA: Diagnosis not present

## 2019-07-03 DIAGNOSIS — Z23 Encounter for immunization: Secondary | ICD-10-CM

## 2019-07-03 DIAGNOSIS — I10 Essential (primary) hypertension: Secondary | ICD-10-CM

## 2019-07-03 NOTE — Progress Notes (Signed)
Careteam: Patient Care Team: Lauree Chandler, NP as PCP - General (Geriatric Medicine)  Advanced Directive information Does Patient Have a Medical Advance Directive?: Yes, Type of Advance Directive: [place];Living will, Does patient want to make changes to medical advance directive?: No - Patient declined  Allergies  Allergen Reactions  . Codeine Nausea And Vomiting  . Dilaudid [Hydromorphone Hcl] Hives and Other (See Comments)    "whelps" (10/18/2012)  . Macrodantin Hives and Other (See Comments)    "drug fever; chills; felt terrible" (10/18/2012)  . Morphine And Related Hives and Nausea And Vomiting  . Sulfa Antibiotics Hives, Rash and Other (See Comments)    "got real sick" (10/18/2012)  . Ibuprofen Other (See Comments)    "felt like I have to go to the bathroom constantly"    Chief Complaint  Patient presents with  . Medical Management of Chronic Issues    6 month follow-up  . Immunizations    Flu vaccine      HPI: Patient is a 85 y.o. female seen in the office today for routine follow up.  She has a h/o hereditary hemochromatosis (sees Dr. Burr Medico), htn, hyperlipidemia, chronic low back pain, overactive bladder, Morton's neuroma, periprosthetic hip fx, osteoporosis, GERD.  Neuropathy- On gabapentin for burning in feet--1 per day. Feels like symptoms were worse.when she was taking 3 tablets before and was having dizziness and a little unsteady and cut back. Now having some worsening of symptoms. Using cinnamon which has been beneficial   Still gets phlebotomies- following with hematology for hemochromatosis, in the past few months has not needed phlebotomy due to numbers being "perfect"  OAB- stable on oxybutynin, controlled on this medication.   GERD- using protonix which controls acid reflux  htn-stable on losartan and lopressor daily  Hx of hyperlipidemia- makes dietary modifications and would not like to be on medication.    Osteoporosis- declines medication and follow up bone density at this time. Continues on cal and vit D  Anxiety/depression- stable on effexor  Chronic back pain- worsen at this time. Uses tylenol 650 mg twice daily  Review of Systems:  Review of Systems  Constitutional: Negative for chills, fever and malaise/fatigue.  HENT: Negative for congestion.   Eyes: Negative for blurred vision.  Respiratory: Negative for cough and shortness of breath.   Cardiovascular: Negative for chest pain, palpitations and leg swelling.  Gastrointestinal: Negative for abdominal pain.  Genitourinary: Negative for dysuria.       OAB but less bothersome recently  Musculoskeletal: Positive for back pain and myalgias. Negative for joint pain.  Skin: Negative for itching and rash.  Neurological: Positive for tingling and sensory change. Negative for dizziness and loss of consciousness.  Endo/Heme/Allergies: Does not bruise/bleed easily.  Psychiatric/Behavioral: Positive for memory loss (stable). Negative for depression. The patient is not nervous/anxious and does not have insomnia.     Past Medical History:  Diagnosis Date  . Basal cell cancer    "face, legs" (10/18/2012)  . Bruises easily   . Chronic lower back pain   . Depression    takes Effexor daily  . Diverticulosis of sigmoid colon 11-20-2009  . Dry eyes    uses Refresh eye drops daily as needed  . Dysphagia   . Gastric ulcer   . GERD (gastroesophageal reflux disease)    takes Omeprazole daily  . H/O hiatal hernia   . Hemochromatosis 1987  . Hemochromatosis   . Hemorrhoids, internal 11-20-09  . History of  blood transfusion    "S/P colonoscopy after polyp removed; got 2 units; esophageal bleed got 1 unit; really messed up hemochromatosis" (1/7/20214)  . History of bronchitis 1991-1996   "chronic; related to winter" (10/18/2012)  . History of colonic polyps    NOTED 11-20-09 IN COLONOSCOPY REPORT  . Hyperlipidemia    no meds required  . Hypertension     takes Metoprolol and Losartan daily  . Joint pain   . Joint swelling   . Muscle pain   . Muscle spasm    takes Robaxin daily as needed  . Occasional tremors   . Osteoarthritis   . Osteoarthritis of right hip 10/18/2012  . Osteoporosis   . Overactive bladder    takes Myrbetriq daily  . PONV (postoperative nausea and vomiting)   . Rheumatic fever    hx of  . Squamous carcinoma    "LLE" (10/18/2012)  . Stenosis of esophagus   . Tremor, essential 10-29-09  . Urinary urgency    takes Ditropan daily  . Vomiting    occasionally   Past Surgical History:  Procedure Laterality Date  . APPENDECTOMY  1952  . CATARACT EXTRACTION W/ INTRAOCULAR LENS  IMPLANT, BILATERAL  2001   "both eyes" (10/18/2012)  . CHOLECYSTECTOMY  1977  . COLONOSCOPY    . DILATION AND CURETTAGE OF UTERUS  1975  . ESOPHAGOGASTRODUODENOSCOPY    . [place]  . SHOULDER ARTHROSCOPY W/ ROTATOR CUFF REPAIR  1998?   "left" (10/18/2012)  . SKIN CANCER EXCISION     "multiple" (10/18/2012)  . TONSILLECTOMY  1953  . TOTAL HIP ARTHROPLASTY  10/18/2012   "right" (10/18/2012)  . TOTAL HIP ARTHROPLASTY  10/18/2012   Procedure: TOTAL HIP ARTHROPLASTY;  Surgeon: Johnny Bridge, MD;  Location: [facility];  Service: Orthopedics;  Laterality: Right;  . TOTAL HIP ARTHROPLASTY Left 07/24/2014   dr Mardelle Matte  . TOTAL HIP ARTHROPLASTY Left 07/24/2014   Procedure: LEFT TOTAL HIP ARTHROPLASTY;  Surgeon: Johnny Bridge, MD;  Location: [facility];  Service: Orthopedics;  Laterality: Left;  . TUBAL LIGATION  1975   Social History:   reports that she has never smoked. She has never used smokeless tobacco. She reports that she does not drink alcohol or use drugs.  Family History  Problem Relation Age of Onset  . Heart disease Mother   . Heart disease Father   . Hyperlipidemia Daughter   . Heart disease Brother   . Heart disease Brother   . Heart disease Brother   . Heart disease Brother   . Cancer Sister   . Melanoma Sister   .  Alzheimer's disease Sister   . Heart disease Sister   . Osteoporosis Sister     Medications: Patient's Medications  New Prescriptions   No medications on file  Previous Medications   ACETAMINOPHEN (TYLENOL) 650 MG CR TABLET    Take 650 mg by mouth 2 (two) times daily.    CALCIUM CITRATE (CALCITRATE - DOSED IN MG ELEMENTAL CALCIUM) 950 MG TABLET    Take 1 tablet by mouth daily.    CARBOXYMETHYLCELLULOSE (REFRESH PLUS) 0.5 % SOLN    Place 1 drop into both eyes 3 (three) times daily as needed. Uses more frequently in left eye as needed.   CHOLECALCIFEROL (VITAMIN D) 1000 UNITS TABLET    Take 1,000 Units by mouth 2 (two) times daily.   GABAPENTIN (NEURONTIN) 300 MG CAPSULE    Take 300 mg by mouth daily.  KRILL OIL 350 MG CAPS    Take 1 capsule by mouth daily.   LOSARTAN (COZAAR) 50 MG TABLET    TAKE 1 TABLET EVERY DAY   METOPROLOL TARTRATE (LOPRESSOR) 25 MG TABLET    TAKE 1 TABLET EVERY DAY   OXYBUTYNIN (DITROPAN) 5 MG TABLET    TAKE 1 TABLET TWICE DAILY (SUBSTITUTED FOR DITROPAN)   PANTOPRAZOLE (PROTONIX) 40 MG TABLET    TAKE 1 TABLET (40 MG TOTAL) BY MOUTH DAILY.   VENLAFAXINE XR (EFFEXOR-XR) 37.5 MG 24 HR CAPSULE    TAKE 1 CAPSULE EVERY DAY WITH BREAKFAST  Modified Medications   No medications on file  Discontinued Medications   No medications on file    Physical Exam:  Vitals:   07/03/19 1327  BP: 130/72  Pulse: 62  Temp: 98.2 F (36.8 C)  TempSrc: Temporal  SpO2: 97%  Weight: 124 lb (56.2 kg)  Height: 5\' 6"  (1.676 m)   Body mass index is 20.01 kg/m. Wt Readings from Last 3 Encounters:  07/03/19 124 lb (56.2 kg)  06/27/19 124 lb 12.8 oz (56.6 kg)  05/12/19 124 lb 12.8 oz (56.6 kg)    Physical Exam Constitutional:      General: She is not in acute distress.    Appearance: She is well-developed. She is not diaphoretic.     Comments: Thin female  HENT:     Head: Normocephalic and atraumatic.     Mouth/Throat:     Pharynx: No oropharyngeal exudate.  Eyes:      Conjunctiva/sclera: Conjunctivae normal.     Pupils: Pupils are equal, round, and reactive to light.  Neck:     Musculoskeletal: Normal range of motion and neck supple.  Cardiovascular:     Rate and Rhythm: Normal rate and regular rhythm.     Heart sounds: Normal heart sounds.  Pulmonary:     Effort: Pulmonary effort is normal.     Breath sounds: Normal breath sounds.  Abdominal:     General: Bowel sounds are normal.     Palpations: Abdomen is soft.  Musculoskeletal:        General: No tenderness.  Skin:    General: Skin is warm and dry.  Neurological:     Mental Status: She is alert and oriented to person, place, and time.     Labs reviewed: Basic Metabolic Panel: Recent Labs    01/27/19  NA 141  K 4.3  BUN 20  CREATININE 0.8   Liver Function Tests: Recent Labs    01/27/19  AST 13  ALT 11   No results for input(s): LIPASE, AMYLASE in the last 8760 hours. No results for input(s): AMMONIA in the last 8760 hours. CBC: Recent Labs    09/23/18 1345 01/27/19 1441 05/12/19 1020  WBC 7.6 7.0 7.3  NEUTROABS 4.3 3.7 4.2  HGB 12.7 12.8 12.5  HCT 38.2 38.5 38.5  MCV 98.7 98.5 97.0  PLT 217 250 250   Lipid Panel: No results for input(s): CHOL, HDL, LDLCALC, TRIG, CHOLHDL, LDLDIRECT in the last 8760 hours. TSH: No results for input(s): TSH in the last 8760 hours. A1C: No results found for: HGBA1C   Assessment/Plan 1. Need for influenza vaccination - Flu Vaccine QUAD High Dose(Fluad)  2. Age-related osteoporosis without current pathological fracture Declines medication and follow up dexa scan. Continues with cal and vit d and weigh bearing activity  3. Overactive bladder Stable on oxybutynin  4. Gastroesophageal reflux disease without esophagitis -stable on protonix 40 mg daily  5. Anxiety and depression -controlled on effexor daily  6. Essential hypertension -stable on metoprolol tartrate  - COMPLETE METABOLIC PANEL WITH GFR  7. Hereditary  hemochromatosis ([place]) -recent blood work has been good. Next appt with hematologist in January.  8. Neuropathy Ongoing slightly worse at this time, can increase gabapentin to twice daily if she feels like pain is not controlled on once daily. Will report back to [place] if she decides to make change. Does not like taking medication so unsure if she will do this.   9. Osteoarthritis of multiple joints, unspecified osteoarthritis type Ongoing, okay to increase tylenol to take tylenol extra strenth 500 mg 2 tablets twice daily- NOT to exceed 2000 mg in 24 hours  Next appt: 6 months for routine follow up  [place]. [place], [facility] [phone]

## 2019-07-03 NOTE — Patient Instructions (Signed)
Follow up in 6 months for routine follow up

## 2019-07-04 LAB — COMPLETE METABOLIC PANEL WITH GFR
AG Ratio: 1.7 (calc) (ref 1.0–2.5)
ALT: 9 U/L (ref 6–29)
AST: 15 U/L (ref 10–35)
Albumin: 4.1 g/dL (ref 3.6–5.1)
Alkaline phosphatase (APISO): 85 U/L (ref 37–153)
BUN/Creatinine Ratio: 36 (calc) — ABNORMAL HIGH (ref 6–22)
BUN: 26 mg/dL — ABNORMAL HIGH (ref 7–25)
CO2: 29 mmol/L (ref 20–32)
Calcium: 9.9 mg/dL (ref 8.6–10.4)
Chloride: 101 mmol/L (ref 98–110)
Creat: 0.73 mg/dL (ref 0.60–0.88)
GFR, Est African American: 86 mL/min/{1.73_m2} (ref >=60)
GFR, Est Non African American: 74 mL/min/{1.73_m2} (ref >=60)
Globulin: 2.4 g/dL (calc) (ref 1.9–3.7)
Glucose, Bld: 96 mg/dL (ref 65–139)
Potassium: 4.1 mmol/L (ref 3.5–5.3)
Sodium: 139 mmol/L (ref 135–146)
Total Bilirubin: 0.6 mg/dL (ref 0.2–1.2)
Total Protein: 6.5 g/dL (ref 6.1–8.1)

## 2019-07-14 ENCOUNTER — Encounter: Payer: Self-pay | Admitting: Nurse Practitioner

## 2019-07-14 ENCOUNTER — Other Ambulatory Visit: Payer: Self-pay

## 2019-07-14 DIAGNOSIS — R350 Frequency of micturition: Secondary | ICD-10-CM

## 2019-07-15 DIAGNOSIS — R350 Frequency of micturition: Secondary | ICD-10-CM | POA: Diagnosis not present

## 2019-07-17 ENCOUNTER — Other Ambulatory Visit: Payer: Self-pay

## 2019-07-17 DIAGNOSIS — R350 Frequency of micturition: Secondary | ICD-10-CM

## 2019-07-18 ENCOUNTER — Encounter: Payer: Self-pay | Admitting: Nurse Practitioner

## 2019-07-19 ENCOUNTER — Encounter: Payer: Self-pay | Admitting: Nurse Practitioner

## 2019-07-19 LAB — URINE CULTURE
MICRO NUMBER:: 955720
SPECIMEN QUALITY:: ADEQUATE

## 2019-07-19 NOTE — Telephone Encounter (Signed)
I see pt has pending appt tomorrow 07/20/2019

## 2019-07-20 ENCOUNTER — Ambulatory Visit (INDEPENDENT_AMBULATORY_CARE_PROVIDER_SITE_OTHER): Payer: Medicare HMO | Admitting: Adult Health

## 2019-07-20 ENCOUNTER — Encounter: Payer: Self-pay | Admitting: Adult Health

## 2019-07-20 ENCOUNTER — Other Ambulatory Visit: Payer: Self-pay

## 2019-07-20 VITALS — BP 132/60 | HR 54 | Temp 97.4°F | Resp 18 | Ht 66.0 in | Wt 123.6 lb

## 2019-07-20 DIAGNOSIS — N390 Urinary tract infection, site not specified: Secondary | ICD-10-CM | POA: Diagnosis not present

## 2019-07-20 MED ORDER — CIPROFLOXACIN HCL 500 MG PO TABS: 500.0000 mg | ORAL_TABLET | Freq: Two times a day (BID) | ORAL | 0 refills | Status: AC

## 2019-07-20 NOTE — Patient Instructions (Addendum)
Urinary tract infection without hematuria, site unspecified  - take Activia yogurt, increase oral fluid intake, change diaper every episode of incontinence - POC Urinalysis Dipstick - ciprofloxacin (CIPRO) 500 MG tablet; Take 1 tablet (500 mg total) by mouth 2 (two) times daily for 7 days.  Dispense: 14 tablet; Refill: 0 - call/follow up with office if symptoms persist  Urinary Tract Infection, Adult A urinary tract infection (UTI) is an infection of any part of the urinary tract. The urinary tract includes:  The kidneys.  The ureters.  The bladder.  The urethra. These organs make, store, and get rid of pee (urine) in the body. What are the causes? This is caused by germs (bacteria) in your genital area. These germs grow and cause swelling (inflammation) of your urinary tract. What increases the risk? You are more likely to develop this condition if:  You have a small, thin tube (catheter) to drain pee.  You cannot control when you pee or poop (incontinence).  You are female, and: ? You use these methods to prevent pregnancy: ? A medicine that kills sperm (spermicide). ? A device that blocks sperm (diaphragm). ? You have low levels of a female hormone (estrogen). ? You are pregnant.  You have genes that add to your risk.  You are sexually active.  You take antibiotic medicines.  You have trouble peeing because of: ? A prostate that is bigger than normal, if you are female. ? A blockage in the part of your body that drains pee from the bladder (urethra). ? A kidney stone. ? A nerve condition that affects your bladder (neurogenic bladder). ? Not getting enough to drink. ? Not peeing often enough.  You have other conditions, such as: ? Diabetes. ? A weak disease-fighting system (immune system). ? Sickle cell disease. ? Gout. ? Injury of the spine. What are the signs or symptoms? Symptoms of this condition include:  Needing to pee right away (urgently).  Peeing  often.  Peeing small amounts often.  Pain or burning when peeing.  Blood in the pee.  Pee that smells bad or not like normal.  Trouble peeing.  Pee that is cloudy.  Fluid coming from the vagina, if you are female.  Pain in the belly or lower back. Other symptoms include:  Throwing up (vomiting).  No urge to eat.  Feeling mixed up (confused).  Being tired and grouchy (irritable).  A fever.  Watery poop (diarrhea). How is this treated? This condition may be treated with:  Antibiotic medicine.  Other medicines.  Drinking enough water. Follow these instructions at home:  Medicines  Take over-the-counter and prescription medicines only as told by your doctor.  If you were prescribed an antibiotic medicine, take it as told by your doctor. Do not stop taking it even if you start to feel better. General instructions  Make sure you: ? Pee until your bladder is empty. ? Do not hold pee for a long time. ? Empty your bladder after sex. ? Wipe from front to back after pooping if you are a female. Use each tissue one time when you wipe.  Drink enough fluid to keep your pee pale yellow.  Keep all follow-up visits as told by your doctor. This is important. Contact a doctor if:  You do not get better after 1-2 days.  Your symptoms go away and then come back. Get help right away if:  You have very bad back pain.  You have very bad pain in your lower  belly.  You have a fever.  You are sick to your stomach (nauseous).  You are throwing up. Summary  A urinary tract infection (UTI) is an infection of any part of the urinary tract.  This condition is caused by germs in your genital area.  There are many risk factors for a UTI. These include having a small, thin tube to drain pee and not being able to control when you pee or poop.  Treatment includes antibiotic medicines for germs.  Drink enough fluid to keep your pee pale yellow. This information is not  intended to replace advice given to you by your health care provider. Make sure you discuss any questions you have with your health care provider. Document Released: 03/16/2008 Document Revised: 09/15/2018 Document Reviewed: 04/07/2018 Elsevier Patient Education  2020 [employer].

## 2019-07-20 NOTE — Progress Notes (Signed)
[facility]  Provider:  Durenda Age - NP  Code Status: Full Code  Goals of Care:  Advanced Directives 07/20/2019  Does Patient Have a Medical Advance Directive? Yes  Type of Paramedic of [place];Living will  Does patient want to make changes to medical advance directive? No - Patient declined  Copy of [place] in Chart? Yes - validated most recent copy scanned in chart (See row information)  Would patient like information on creating a medical advance directive? -  Pre-existing out of facility DNR order (yellow form or pink MOST form) -     Chief Complaint  Patient presents with  . Acute Visit    C/o possible UTI, H/O cystitis    HPI: Patient is a 85 y.o. female seen today with her daughter for an acute visit for possible UTI. A week ago, she started having frequent urination at night, dysuria, chills. She took AZO X 2-3 days..Bilateral groin was hurting a little bit. She denies confusion but was a little bit irritated from having frequent urination. She usually wear pads but has to wear depends and still not holding her urine. Daughter encouraged her to drink more fluids.   Past Medical History:  Diagnosis Date  . Basal cell cancer    "face, legs" (10/18/2012)  . Bruises easily   . Chronic lower back pain   . Depression    takes Effexor daily  . Diverticulosis of sigmoid colon 11-20-2009  . Dry eyes    uses Refresh eye drops daily as needed  . Dysphagia   . Gastric ulcer   . GERD (gastroesophageal reflux disease)    takes Omeprazole daily  . H/O hiatal hernia   . Hemochromatosis 1987  . Hemochromatosis   . Hemorrhoids, internal 11-20-09  . History of blood transfusion    "S/P colonoscopy after polyp removed; got 2 units; esophageal bleed got 1 unit; really messed up hemochromatosis" (1/7/20214)  . History of bronchitis 1991-1996   "chronic; related to winter" (10/18/2012)  . History of colonic polyps    NOTED 11-20-09  IN COLONOSCOPY REPORT  . Hyperlipidemia    no meds required  . Hypertension    takes Metoprolol and Losartan daily  . Joint pain   . Joint swelling   . Muscle pain   . Muscle spasm    takes Robaxin daily as needed  . Occasional tremors   . Osteoarthritis   . Osteoarthritis of right hip 10/18/2012  . Osteoporosis   . Overactive bladder    takes Myrbetriq daily  . PONV (postoperative nausea and vomiting)   . Rheumatic fever    hx of  . Squamous carcinoma    "LLE" (10/18/2012)  . Stenosis of esophagus   . Tremor, essential 10-29-09  . Urinary urgency    takes Ditropan daily  . Vomiting    occasionally    Past Surgical History:  Procedure Laterality Date  . APPENDECTOMY  1952  . CATARACT EXTRACTION W/ INTRAOCULAR LENS  IMPLANT, BILATERAL  2001   "both eyes" (10/18/2012)  . CHOLECYSTECTOMY  1977  . COLONOSCOPY    . DILATION AND CURETTAGE OF UTERUS  1975  . ESOPHAGOGASTRODUODENOSCOPY    . [place]  . SHOULDER ARTHROSCOPY W/ ROTATOR CUFF REPAIR  1998?   "left" (10/18/2012)  . SKIN CANCER EXCISION     "multiple" (10/18/2012)  . TONSILLECTOMY  1953  . TOTAL HIP ARTHROPLASTY  10/18/2012   "right" (10/18/2012)  .  TOTAL HIP ARTHROPLASTY  10/18/2012   Procedure: TOTAL HIP ARTHROPLASTY;  Surgeon: Johnny Bridge, MD;  Location: [facility];  Service: Orthopedics;  Laterality: Right;  . TOTAL HIP ARTHROPLASTY Left 07/24/2014   dr Mardelle Matte  . TOTAL HIP ARTHROPLASTY Left 07/24/2014   Procedure: LEFT TOTAL HIP ARTHROPLASTY;  Surgeon: Johnny Bridge, MD;  Location: [facility];  Service: Orthopedics;  Laterality: Left;  . TUBAL LIGATION  1975    Allergies  Allergen Reactions  . Codeine Nausea And Vomiting  . Dilaudid [Hydromorphone Hcl] Hives and Other (See Comments)    "whelps" (10/18/2012)  . Macrodantin Hives and Other (See Comments)    "drug fever; chills; felt terrible" (10/18/2012)  . Morphine And Related Hives and Nausea And Vomiting  . Sulfa Antibiotics Hives, Rash and Other (See  Comments)    "got real sick" (10/18/2012)  . Ibuprofen Other (See Comments)    "felt like I have to go to the bathroom constantly"    Outpatient Encounter Medications as of 07/20/2019  Medication Sig  . acetaminophen (TYLENOL) 650 MG CR tablet Take 650 mg by mouth 2 (two) times daily.   . calcium citrate (CALCITRATE - DOSED IN MG ELEMENTAL CALCIUM) 950 MG tablet Take 1 tablet by mouth daily.   . carboxymethylcellulose (REFRESH PLUS) 0.5 % SOLN Place 1 drop into both eyes 3 (three) times daily as needed. Uses more frequently in left eye as needed.  . cholecalciferol (VITAMIN D) 1000 UNITS tablet Take 1,000 Units by mouth 2 (two) times daily.  Marland Kitchen gabapentin (NEURONTIN) 300 MG capsule Take 300 mg by mouth daily.  Javier Docker Oil 350 MG CAPS Take 1 capsule by mouth daily.  Marland Kitchen losartan (COZAAR) 50 MG tablet TAKE 1 TABLET EVERY DAY  . metoprolol tartrate (LOPRESSOR) 25 MG tablet TAKE 1 TABLET EVERY DAY  . oxybutynin (DITROPAN) 5 MG tablet TAKE 1 TABLET TWICE DAILY (SUBSTITUTED FOR DITROPAN)  . pantoprazole (PROTONIX) 40 MG tablet TAKE 1 TABLET (40 MG TOTAL) BY MOUTH DAILY.  Marland Kitchen venlafaxine XR (EFFEXOR-XR) 37.5 MG 24 hr capsule TAKE 1 CAPSULE EVERY DAY WITH BREAKFAST  . ciprofloxacin (CIPRO) 500 MG tablet Take 1 tablet (500 mg total) by mouth 2 (two) times daily for 7 days.   No facility-administered encounter medications on file as of 07/20/2019.     Review of Systems:  Review of Systems  Constitutional: Positive for chills and diaphoresis. Negative for appetite change.  HENT: Negative.   Respiratory: Negative.   Gastrointestinal: Negative for abdominal pain and nausea.  Genitourinary: Positive for dysuria, flank pain, frequency and pelvic pain. Negative for vaginal bleeding.  Musculoskeletal: Positive for back pain.  Skin: Negative.   Psychiatric/Behavioral: Negative.     Health Maintenance  Topic Date Due  . MAMMOGRAM  06/26/2020 (Originally 02/19/2018)  . TETANUS/TDAP  05/28/2023  . INFLUENZA  VACCINE  Completed  . DEXA SCAN  Completed  . PNA vac Low Risk Adult  Completed    Physical Exam: Vitals:   07/20/19 1309  BP: 132/60  Pulse: (!) 54  Resp: 18  Temp: (!) 97.4 F (36.3 C)  SpO2: 97%  Weight: 123 lb 9.6 oz (56.1 kg)  Height: 5\' 6"  (1.676 m)   Body mass index is 19.95 kg/m.   Physical Exam Constitutional:      Appearance: Normal appearance. She is normal weight.  HENT:     Head: Normocephalic.     Nose: Nose normal.     Mouth/Throat:     Mouth: Mucous membranes  are moist.     Pharynx: Oropharynx is clear.  Neck:     Musculoskeletal: Normal range of motion.  Cardiovascular:     Rate and Rhythm: Normal rate and regular rhythm.     Pulses: Normal pulses.     Heart sounds: Normal heart sounds.  Pulmonary:     Effort: Pulmonary effort is normal.     Breath sounds: Normal breath sounds.  Abdominal:     General: Bowel sounds are normal.     Palpations: Abdomen is soft.  Musculoskeletal: Normal range of motion.  Skin:    General: Skin is warm and dry.  Neurological:     Mental Status: She is alert.     Labs reviewed: Basic Metabolic Panel: Recent Labs    01/27/19 07/03/19 1421  NA 141 139  K 4.3 4.1  CL   101  CO2  29  GLUCOSE   96  BUN 20 26  CREATININE 0.8 0.73  CALCIUM    9.9   Liver Function Tests: Recent Labs    01/27/19 07/03/19 1421  AST 13 15  ALT 11 9  BILITOT  --  0.6  PROT  --  6.5    CBC: Recent Labs    09/23/18 1345 01/27/19 1441 05/12/19 1020  WBC 7.6 7.0 7.3  NEUTROABS 4.3 3.7 4.2  HGB 12.7 12.8 12.5  HCT 38.2 38.5 38.5  MCV 98.7 98.5 97.0  PLT 217 250 250    Assessment/Plan  1. Urinary tract infection without hematuria, site unspecified  - take Activia yogurt, increase oral fluid intake, change diaper every small episode of incontinence  - POC Urinalysis Dipstick showed leukocyte moderate, Nitrite positive, Blood small  - ciprofloxacin (CIPRO) 500 MG tablet; Take 1 tablet (500 mg total) by mouth 2  (two) times daily for 7 days.  Dispense: 14 tablet Refill: 0   Labs/tests ordered:  Urinalysis with culture and sensitivity   Next appt:  01/05/2020   Ava, FNP-BC [place]

## 2019-07-22 LAB — URINE CULTURE
MICRO NUMBER:: 970435
SPECIMEN QUALITY:: ADEQUATE

## 2019-07-23 ENCOUNTER — Encounter: Payer: Self-pay | Admitting: Adult Health

## 2019-07-24 LAB — POCT URINALYSIS DIPSTICK
Glucose, UA: NEGATIVE
Ketones, UA: NEGATIVE
Nitrite, UA: POSITIVE
Odor: NEGATIVE
Protein, UA: POSITIVE — AB
Spec Grav, UA: 1.025 (ref 1.010–1.025)
Urobilinogen, UA: NEGATIVE E.U./dL — AB
pH, UA: 5 (ref 5.0–8.0)

## 2019-07-28 ENCOUNTER — Other Ambulatory Visit: Payer: Self-pay | Admitting: Nurse Practitioner

## 2019-07-28 DIAGNOSIS — F329 Major depressive disorder, single episode, unspecified: Secondary | ICD-10-CM

## 2019-07-28 DIAGNOSIS — F419 Anxiety disorder, unspecified: Secondary | ICD-10-CM

## 2019-07-31 ENCOUNTER — Encounter: Payer: Self-pay | Admitting: Nurse Practitioner

## 2019-11-10 ENCOUNTER — Other Ambulatory Visit: Payer: Self-pay

## 2019-11-10 ENCOUNTER — Inpatient Hospital Stay: Payer: Medicare HMO | Attending: Hematology

## 2019-11-10 LAB — CBC WITH DIFFERENTIAL/PLATELET
Abs Immature Granulocytes: 0.02 10*3/uL (ref 0.00–0.07)
Basophils Absolute: 0 10*3/uL (ref 0.0–0.1)
Basophils Relative: 0 %
Eosinophils Absolute: 0.2 10*3/uL (ref 0.0–0.5)
Eosinophils Relative: 2 %
HCT: 39.9 % (ref 36.0–46.0)
Hemoglobin: 12.9 g/dL (ref 12.0–15.0)
Immature Granulocytes: 0 %
Lymphocytes Relative: 38 %
Lymphs Abs: 2.5 10*3/uL (ref 0.7–4.0)
MCH: 32.9 pg (ref 26.0–34.0)
MCHC: 32.3 g/dL (ref 30.0–36.0)
MCV: 101.8 fL — ABNORMAL HIGH (ref 80.0–100.0)
Monocytes Absolute: 0.6 10*3/uL (ref 0.1–1.0)
Monocytes Relative: 10 %
Neutro Abs: 3.3 10*3/uL (ref 1.7–7.7)
Neutrophils Relative %: 50 %
Platelets: 263 10*3/uL (ref 150–400)
RBC: 3.92 MIL/uL (ref 3.87–5.11)
RDW: 12.3 % (ref 11.5–15.5)
WBC: 6.7 10*3/uL (ref 4.0–10.5)
nRBC: 0 % (ref 0.0–0.2)

## 2019-11-10 LAB — IRON AND TIBC
Iron: 233 ug/dL — ABNORMAL HIGH (ref 41–142)
Saturation Ratios: 103 % — ABNORMAL HIGH (ref 21–57)
TIBC: 228 ug/dL — ABNORMAL LOW (ref 236–444)
UIBC: UNDETERMINED ug/dL (ref 120–384)

## 2019-11-10 LAB — FERRITIN: Ferritin: 78 ng/mL (ref 11–307)

## 2019-11-11 LAB — AFP TUMOR MARKER: AFP, Serum, Tumor Marker: 1 ng/mL (ref 0.0–8.3)

## 2019-11-13 ENCOUNTER — Telehealth: Payer: Self-pay

## 2019-11-13 NOTE — Telephone Encounter (Signed)
I spoke with Ms Denise Lane' daughter Ms Denise Lane.  I lter her know her moms ferritin level was above the goal at 78.  Ok Edwards agreed to phlebotomy next week.  I sent a scheduling message.

## 2019-11-21 ENCOUNTER — Inpatient Hospital Stay: Payer: Medicare HMO | Attending: Hematology

## 2019-11-21 ENCOUNTER — Other Ambulatory Visit: Payer: Self-pay

## 2019-11-21 MED ORDER — SODIUM CHLORIDE 0.9 % IV SOLN
Freq: Once | INTRAVENOUS | Status: AC
  Filled 2019-11-21: qty 250

## 2019-11-21 NOTE — Progress Notes (Signed)
Denise Lane presents today for phlebotomy per MD orders. Phlebotomy procedure started at 1442 and ended at 1458 with 500 g  removed via 20 g to RAC. IVF started after procedure. Patient tolerated procedure well. Diet and nutrition offered.

## 2019-11-21 NOTE — Patient Instructions (Signed)

## 2019-11-28 ENCOUNTER — Other Ambulatory Visit: Payer: Self-pay | Admitting: Nurse Practitioner

## 2019-12-07 ENCOUNTER — Other Ambulatory Visit: Payer: Self-pay | Admitting: Nurse Practitioner

## 2019-12-07 DIAGNOSIS — I10 Essential (primary) hypertension: Secondary | ICD-10-CM

## 2019-12-17 ENCOUNTER — Other Ambulatory Visit: Payer: Self-pay | Admitting: Nurse Practitioner

## 2019-12-18 MED ORDER — GABAPENTIN 300 MG PO CAPS: 300.0000 mg | ORAL_CAPSULE | Freq: Every day | ORAL | 1 refills | Status: DC

## 2020-01-05 ENCOUNTER — Ambulatory Visit (INDEPENDENT_AMBULATORY_CARE_PROVIDER_SITE_OTHER): Payer: Medicare HMO | Admitting: Nurse Practitioner

## 2020-01-05 ENCOUNTER — Other Ambulatory Visit: Payer: Self-pay | Admitting: Hematology

## 2020-01-05 ENCOUNTER — Other Ambulatory Visit: Payer: Self-pay

## 2020-01-05 ENCOUNTER — Encounter: Payer: Self-pay | Admitting: Nurse Practitioner

## 2020-01-05 VITALS — BP 130/68 | HR 70 | Temp 97.8°F | Resp 16 | Ht 66.0 in | Wt 118.0 lb

## 2020-01-05 DIAGNOSIS — G629 Polyneuropathy, unspecified: Secondary | ICD-10-CM

## 2020-01-05 DIAGNOSIS — M810 Age-related osteoporosis without current pathological fracture: Secondary | ICD-10-CM

## 2020-01-05 DIAGNOSIS — K219 Gastro-esophageal reflux disease without esophagitis: Secondary | ICD-10-CM | POA: Diagnosis not present

## 2020-01-05 DIAGNOSIS — F419 Anxiety disorder, unspecified: Secondary | ICD-10-CM | POA: Diagnosis not present

## 2020-01-05 DIAGNOSIS — M159 Polyosteoarthritis, unspecified: Secondary | ICD-10-CM

## 2020-01-05 DIAGNOSIS — F329 Major depressive disorder, single episode, unspecified: Secondary | ICD-10-CM | POA: Diagnosis not present

## 2020-01-05 DIAGNOSIS — N3281 Overactive bladder: Secondary | ICD-10-CM | POA: Diagnosis not present

## 2020-01-05 DIAGNOSIS — I110 Hypertensive heart disease with heart failure: Secondary | ICD-10-CM

## 2020-01-05 NOTE — Patient Instructions (Addendum)
Can try Claritin 10 mg daily for sneezing and congestion   Make sure to get 3 meals a day

## 2020-01-05 NOTE — Progress Notes (Signed)
Careteam: Patient Care Team: Lauree Chandler, NP as PCP - General (Geriatric Medicine)  PLACE OF SERVICE:  [place] Directive information Does Patient Have a Medical Advance Directive?: Yes, Type of Advance Directive: Living will;Healthcare Power of Attorney, Does patient want to make changes to medical advance directive?: No - Patient declined  Allergies  Allergen Reactions  . Codeine Nausea And Vomiting  . Dilaudid [Hydromorphone Hcl] Hives and Other (See Comments)    "whelps" (10/18/2012)  . Macrodantin Hives and Other (See Comments)    "drug fever; chills; felt terrible" (10/18/2012)  . Morphine And Related Hives and Nausea And Vomiting  . Sulfa Antibiotics Hives, Rash and Other (See Comments)    "got real sick" (10/18/2012)  . Ibuprofen Other (See Comments)    "felt like I have to go to the bathroom constantly"    Chief Complaint  Patient presents with  . Follow-up    6 Month Follow Up     HPI: Patient is a 86 y.o. female for routine follow up.   Continues with hereditary hemochromatosis- effecting nails, skin and hair. Recently got phlebotomy and felt much better.   Neuropathy- bothers her all the time. Continues to take gabapentin 300 mg- does not want to take any more.   Weight loss- only eating 2 meals a day. Drinking protein shakes  GERD- stable on protonix.   OAB- stable on oxybutynin.   HTN- controlled on lopressor and losartan    Review of Systems:  Review of Systems  Constitutional: Negative for chills, fever and weight loss.  HENT: Negative for tinnitus.   Respiratory: Negative for cough, sputum production and shortness of breath.   Cardiovascular: Negative for chest pain, palpitations and leg swelling.  Gastrointestinal: Negative for abdominal pain, constipation, diarrhea and heartburn.  Genitourinary: Positive for frequency. Negative for dysuria and urgency.  Musculoskeletal: Positive for back pain and joint pain. Negative for falls  and myalgias.  Skin: Negative.   Neurological: Positive for tingling. Negative for dizziness and headaches.  Psychiatric/Behavioral: Negative for depression and memory loss. The patient does not have insomnia.     Past Medical History:  Diagnosis Date  . Basal cell cancer    "face, legs" (10/18/2012)  . Bruises easily   . Chronic lower back pain   . Depression    takes Effexor daily  . Diverticulosis of sigmoid colon 11-20-2009  . Dry eyes    uses Refresh eye drops daily as needed  . Dysphagia   . Gastric ulcer   . GERD (gastroesophageal reflux disease)    takes Omeprazole daily  . H/O hiatal hernia   . Hemochromatosis 1987  . Hemochromatosis   . Hemorrhoids, internal 11-20-09  . History of blood transfusion    "S/P colonoscopy after polyp removed; got 2 units; esophageal bleed got 1 unit; really messed up hemochromatosis" (1/7/20214)  . History of bronchitis 1991-1996   "chronic; related to winter" (10/18/2012)  . History of colonic polyps    NOTED 11-20-09 IN COLONOSCOPY REPORT  . Hyperlipidemia    no meds required  . Hypertension    takes Metoprolol and Losartan daily  . Joint pain   . Joint swelling   . Muscle pain   . Muscle spasm    takes Robaxin daily as needed  . Occasional tremors   . Osteoarthritis   . Osteoarthritis of right hip 10/18/2012  . Osteoporosis   . Overactive bladder    takes Myrbetriq daily  . PONV (postoperative  nausea and vomiting)   . Rheumatic fever    hx of  . Squamous carcinoma    "LLE" (10/18/2012)  . Stenosis of esophagus   . Tremor, essential 10-29-09  . Urinary urgency    takes Ditropan daily  . Vomiting    occasionally   Past Surgical History:  Procedure Laterality Date  . APPENDECTOMY  1952  . CATARACT EXTRACTION W/ INTRAOCULAR LENS  IMPLANT, BILATERAL  2001   "both eyes" (10/18/2012)  . CHOLECYSTECTOMY  1977  . COLONOSCOPY    . DILATION AND CURETTAGE OF UTERUS  1975  . ESOPHAGOGASTRODUODENOSCOPY    . [place]  .  SHOULDER ARTHROSCOPY W/ ROTATOR CUFF REPAIR  1998?   "left" (10/18/2012)  . SKIN CANCER EXCISION     "multiple" (10/18/2012)  . TONSILLECTOMY  1953  . TOTAL HIP ARTHROPLASTY  10/18/2012   "right" (10/18/2012)  . TOTAL HIP ARTHROPLASTY  10/18/2012   Procedure: TOTAL HIP ARTHROPLASTY;  Surgeon: Johnny Bridge, MD;  Location: [facility];  Service: Orthopedics;  Laterality: Right;  . TOTAL HIP ARTHROPLASTY Left 07/24/2014   dr Mardelle Matte  . TOTAL HIP ARTHROPLASTY Left 07/24/2014   Procedure: LEFT TOTAL HIP ARTHROPLASTY;  Surgeon: Johnny Bridge, MD;  Location: [facility];  Service: Orthopedics;  Laterality: Left;  . TUBAL LIGATION  1975   Social History:   reports that she has never smoked. She has never used smokeless tobacco. She reports that she does not drink alcohol or use drugs.  Family History  Problem Relation Age of Onset  . Heart disease Mother   . Heart disease Father   . Hyperlipidemia Daughter   . Heart disease Brother   . Heart disease Brother   . Heart disease Brother   . Heart disease Brother   . Cancer Sister   . Melanoma Sister   . Alzheimer's disease Sister   . Heart disease Sister   . Osteoporosis Sister     Medications: Patient's Medications  New Prescriptions   No medications on file  Previous Medications   ACETAMINOPHEN (TYLENOL) 650 MG CR TABLET    Take 650 mg by mouth 2 (two) times daily.    CALCIUM CITRATE (CALCITRATE - DOSED IN MG ELEMENTAL CALCIUM) 950 MG TABLET    Take 1 tablet by mouth daily.    CARBOXYMETHYLCELLULOSE (REFRESH PLUS) 0.5 % SOLN    Place 1 drop into both eyes 3 (three) times daily as needed. Uses more frequently in left eye as needed.   CHOLECALCIFEROL (VITAMIN D) 1000 UNITS TABLET    Take 1,000 Units by mouth 2 (two) times daily.   GABAPENTIN (NEURONTIN) 300 MG CAPSULE    Take 1 capsule (300 mg total) by mouth daily.   KRILL OIL 350 MG CAPS    Take 1 capsule by mouth daily.   LOSARTAN (COZAAR) 50 MG TABLET    TAKE 1 TABLET EVERY DAY   METOPROLOL  TARTRATE (LOPRESSOR) 25 MG TABLET    TAKE 1 TABLET EVERY DAY   OXYBUTYNIN (DITROPAN) 5 MG TABLET    TAKE 1 TABLET TWICE DAILY (SUBSTITUTED FOR DITROPAN)   PANTOPRAZOLE (PROTONIX) 40 MG TABLET    TAKE 1 TABLET (40 MG TOTAL) BY MOUTH DAILY.   VENLAFAXINE XR (EFFEXOR-XR) 37.5 MG 24 HR CAPSULE    TAKE 1 CAPSULE EVERY DAY WITH BREAKFAST  Modified Medications   No medications on file  Discontinued Medications   No medications on file    Physical Exam:  Vitals:  01/05/20 1409  BP: 130/68  Pulse: 70  Resp: 16  Temp: 97.8 F (36.6 C)  SpO2: 98%  Weight: 118 lb (53.5 kg)  Height: 5\' 6"  (1.676 m)   Body mass index is 19.05 kg/m. Wt Readings from Last 3 Encounters:  01/05/20 118 lb (53.5 kg)  07/20/19 123 lb 9.6 oz (56.1 kg)  07/03/19 124 lb (56.2 kg)    Physical Exam Constitutional:      General: She is not in acute distress.    Appearance: She is well-developed. She is not diaphoretic.  HENT:     Head: Normocephalic and atraumatic.     Mouth/Throat:     Pharynx: No oropharyngeal exudate.  Eyes:     Conjunctiva/sclera: Conjunctivae normal.     Pupils: Pupils are equal, round, and reactive to light.  Cardiovascular:     Rate and Rhythm: Normal rate and regular rhythm.     Heart sounds: Normal heart sounds.  Pulmonary:     Effort: Pulmonary effort is normal.     Breath sounds: Normal breath sounds.  Abdominal:     General: Bowel sounds are normal.     Palpations: Abdomen is soft.  Musculoskeletal:        General: No tenderness.     Cervical back: Normal range of motion and neck supple.  Skin:    General: Skin is warm and dry.  Neurological:     Mental Status: She is alert and oriented to person, place, and time.     Labs reviewed: Basic Metabolic Panel: Recent Labs    01/27/19 0000 07/03/19 1421  NA 141 139  K 4.3 4.1  CL  --  101  CO2  --  29  GLUCOSE  --  96  BUN 20 26*  CREATININE 0.8 0.73  CALCIUM  --  9.9   Liver Function Tests: Recent Labs     01/27/19 0000 07/03/19 1421  AST 13 15  ALT 11 9  BILITOT  --  0.6  PROT  --  6.5   No results for input(s): LIPASE, AMYLASE in the last 8760 hours. No results for input(s): AMMONIA in the last 8760 hours. CBC: Recent Labs    01/27/19 1441 05/12/19 1020 11/10/19 1152  WBC 7.0 7.3 6.7  NEUTROABS 3.7 4.2 3.3  HGB 12.8 12.5 12.9  HCT 38.5 38.5 39.9  MCV 98.5 97.0 101.8*  PLT 250 250 263   Lipid Panel: No results for input(s): CHOL, HDL, LDLCALC, TRIG, CHOLHDL, LDLDIRECT in the last 8760 hours. TSH: No results for input(s): TSH in the last 8760 hours. A1C: No results found for: HGBA1C   Assessment/Plan 1. Hypertensive heart disease with heart failure (HCC) -stable, no symptoms of chest pains, shortness of breath or edema.  2. Hereditary hemochromatosis ([place]) Ongoing, following with hematology routinely with phlebotomy as needed   3. Gastroesophageal reflux disease without esophagitis Stable on protonix   4. Neuropathy -ongoing, does not wish to adjust medication, continues on gabapentin 300 mg daily at bedtime  5. Osteoarthritis of multiple joints, unspecified osteoarthritis type Ongoing, uses tylenol as needed   6. Age-related osteoporosis without current pathological fracture -declines Rx treatment but continues on cal and vit d  7. Overactive bladder -ongoing but controlled on oxybutynin, does report side effects but tolerable at this time.   8. Anxiety and depression Stable on effexor  Next appt: 6 months. Carlos American. [place], [facility] [phone]

## 2020-01-25 ENCOUNTER — Other Ambulatory Visit: Payer: Self-pay | Admitting: Nurse Practitioner

## 2020-01-25 DIAGNOSIS — K219 Gastro-esophageal reflux disease without esophagitis: Secondary | ICD-10-CM

## 2020-01-31 ENCOUNTER — Other Ambulatory Visit: Payer: Self-pay | Admitting: Nurse Practitioner

## 2020-01-31 DIAGNOSIS — F419 Anxiety disorder, unspecified: Secondary | ICD-10-CM

## 2020-01-31 DIAGNOSIS — F329 Major depressive disorder, single episode, unspecified: Secondary | ICD-10-CM

## 2020-03-18 DIAGNOSIS — M25551 Pain in right hip: Secondary | ICD-10-CM | POA: Diagnosis not present

## 2020-03-18 DIAGNOSIS — M545 Low back pain: Secondary | ICD-10-CM | POA: Diagnosis not present

## 2020-03-30 ENCOUNTER — Other Ambulatory Visit: Payer: Self-pay | Admitting: Nurse Practitioner

## 2020-03-30 DIAGNOSIS — I10 Essential (primary) hypertension: Secondary | ICD-10-CM

## 2020-04-11 ENCOUNTER — Telehealth: Payer: Self-pay | Admitting: Hematology

## 2020-04-11 NOTE — Telephone Encounter (Signed)
R/s per provider. Changes in template. Pt is aware of appts

## 2020-05-06 NOTE — Progress Notes (Signed)
[place]   [phone] [phone]) [phone]   Clinic Follow up Note   Patient Care Team: Lauree Chandler, NP as PCP - General (Geriatric Medicine)  Date of Service:  05/10/2020  CHIEF COMPLAINT: F/u ofHereditary hemochromatosis  CURRENT THERAPY:  PRN phlebotomy to keep ferritin <=75, and transferrin saturation less than 75%   INTERVAL HISTORY:  Denise Lane is here for a follow up ofHereditary hemochromatosis. She was last seen by me 1 year ago. She presents to the clinic alone. She notes she about 3-4 weeks ago she had lost her balance after looking up once. She notes she almost fell but able to catch herself. She notes since then she had a right hip contusion with pain that has persisted. She has tried Tylenol for this which helps the arthritis. She note she is allergic to many other pain medications. She also notes back pain continues. She notes she has hair loss and mouth dryness. She also notes her having squamous cell spots removed as needed. She notes overall skin itching but does not know the cause.  She notes she felt better after her phlebotomy in 11/2019. She notes she has had her vaccine.     REVIEW OF SYSTEMS:   Constitutional: Denies fevers, chills or abnormal weight loss Eyes: Denies blurriness of vision Ears, nose, mouth, throat, and face: Denies mucositis or sore throat Respiratory: Denies cough, dyspnea or wheezes Cardiovascular: Denies palpitation, chest discomfort or lower extremity swelling Gastrointestinal:  Denies nausea, heartburn or change in bowel habits Skin: Denies abnormal skin rashes (+) skin itching  MSK: (+) right hip pain (+) back pain  Lymphatics: Denies new lymphadenopathy or easy bruising Neurological:Denies numbness, tingling or new weaknesses Behavioral/Psych: Mood is stable, no new changes  All other systems were reviewed with the patient and are negative.  MEDICAL HISTORY:  Past Medical History:    Diagnosis Date  . Basal cell cancer    "face, legs" (10/18/2012)  . Bruises easily   . Chronic lower back pain   . Depression    takes Effexor daily  . Diverticulosis of sigmoid colon 11-20-2009  . Dry eyes    uses Refresh eye drops daily as needed  . Dysphagia   . Gastric ulcer   . GERD (gastroesophageal reflux disease)    takes Omeprazole daily  . H/O hiatal hernia   . Hemochromatosis 1987  . Hemochromatosis   . Hemorrhoids, internal 11-20-09  . History of blood transfusion    "S/P colonoscopy after polyp removed; got 2 units; esophageal bleed got 1 unit; really messed up hemochromatosis" (1/7/20214)  . History of bronchitis 1991-1996   "chronic; related to winter" (10/18/2012)  . History of colonic polyps    NOTED 11-20-09 IN COLONOSCOPY REPORT  . Hyperlipidemia    no meds required  . Hypertension    takes Metoprolol and Losartan daily  . Joint pain   . Joint swelling   . Muscle pain   . Muscle spasm    takes Robaxin daily as needed  . Occasional tremors   . Osteoarthritis   . Osteoarthritis of right hip 10/18/2012  . Osteoporosis   . Overactive bladder    takes Myrbetriq daily  . PONV (postoperative nausea and vomiting)   . Rheumatic fever    hx of  . Squamous carcinoma    "LLE" (10/18/2012)  . Stenosis of esophagus   . Tremor, essential 10-29-09  . Urinary urgency    takes Ditropan daily  . Vomiting  occasionally    SURGICAL HISTORY: Past Surgical History:  Procedure Laterality Date  . APPENDECTOMY  1952  . CATARACT EXTRACTION W/ INTRAOCULAR LENS  IMPLANT, BILATERAL  2001   "both eyes" (10/18/2012)  . CHOLECYSTECTOMY  1977  . COLONOSCOPY    . DILATION AND CURETTAGE OF UTERUS  1975  . ESOPHAGOGASTRODUODENOSCOPY    . [place]  . SHOULDER ARTHROSCOPY W/ ROTATOR CUFF REPAIR  1998?   "left" (10/18/2012)  . SKIN CANCER EXCISION     "multiple" (10/18/2012)  . TONSILLECTOMY  1953  . TOTAL HIP ARTHROPLASTY  10/18/2012   "right" (10/18/2012)  . TOTAL HIP  ARTHROPLASTY  10/18/2012   Procedure: TOTAL HIP ARTHROPLASTY;  Surgeon: Johnny Bridge, MD;  Location: [facility];  Service: Orthopedics;  Laterality: Right;  . TOTAL HIP ARTHROPLASTY Left 07/24/2014   dr Mardelle Matte  . TOTAL HIP ARTHROPLASTY Left 07/24/2014   Procedure: LEFT TOTAL HIP ARTHROPLASTY;  Surgeon: Johnny Bridge, MD;  Location: [facility];  Service: Orthopedics;  Laterality: Left;  . TUBAL LIGATION  1975    I have reviewed the social history and family history with the patient and they are unchanged from previous note.  ALLERGIES:  is allergic to codeine, dilaudid [hydromorphone hcl], macrodantin, morphine and related, sulfa antibiotics, and ibuprofen.  MEDICATIONS:  Current Outpatient Medications  Medication Sig Dispense Refill  . acetaminophen (TYLENOL) 650 MG CR tablet Take 650 mg by mouth 2 (two) times daily.     . calcium citrate (CALCITRATE - DOSED IN MG ELEMENTAL CALCIUM) 950 MG tablet Take 1 tablet by mouth daily.     . carboxymethylcellulose (REFRESH PLUS) 0.5 % SOLN Place 1 drop into both eyes 3 (three) times daily as needed. Uses more frequently in left eye as needed.    . cholecalciferol (VITAMIN D) 1000 UNITS tablet Take 1,000 Units by mouth 2 (two) times daily.    Marland Kitchen gabapentin (NEURONTIN) 300 MG capsule Take 1 capsule (300 mg total) by mouth daily. 90 capsule 1  . Krill Oil 350 MG CAPS Take 1 capsule by mouth daily.    Marland Kitchen losartan (COZAAR) 50 MG tablet TAKE 1 TABLET EVERY DAY 90 tablet 0  . metoprolol tartrate (LOPRESSOR) 25 MG tablet TAKE 1 TABLET EVERY DAY 90 tablet 2  . oxybutynin (DITROPAN) 5 MG tablet TAKE 1 TABLET TWICE DAILY (SUBSTITUTED FOR DITROPAN) 180 tablet 1  . pantoprazole (PROTONIX) 40 MG tablet TAKE 1 TABLET EVERY DAY 90 tablet 3  . venlafaxine XR (EFFEXOR-XR) 37.5 MG 24 hr capsule TAKE 1 CAPSULE EVERY DAY WITH BREAKFAST 90 capsule 1   No current facility-administered medications for this visit.    PHYSICAL EXAMINATION: ECOG PERFORMANCE STATUS: 2 -  Symptomatic, <50% confined to bed  Vitals:   05/10/20 1025  BP: (!) 152/99  Pulse: 62  Resp: 18  Temp: 98.1 F (36.7 C)  SpO2: 96%   Filed Weights   05/10/20 1025  Weight: 115 lb (52.2 kg)    Due to COVID19 we will limit examination to appearance. Patient had no complaints.  GENERAL:alert, no distress and comfortable SKIN: skin color normal, no rashes or significant lesions EYES: normal, Conjunctiva are pink and non-injected, sclera clear  NEURO: alert & oriented x 3 with fluent speech   LABORATORY DATA:  I have reviewed the data as listed CBC Latest Ref Rng & Units 05/10/2020 11/10/2019 05/12/2019  WBC 4.0 - 10.5 K/uL 8.6 6.7 7.3  Hemoglobin 12.0 - 15.0 g/dL 12.6 12.9 12.5  Hematocrit  36 - 46 % 37.6 39.9 38.5  Platelets 150 - 400 K/uL 241 263 250     CMP Latest Ref Rng & Units 05/10/2020 07/03/2019 01/27/2019  Glucose 70 - 99 mg/dL 94 96 -  BUN 8 - 23 mg/dL 23 26(H) 20  Creatinine 0.44 - 1.00 mg/dL 0.80 0.73 0.8  Sodium 135 - 145 mmol/L 140 139 141  Potassium 3.5 - 5.1 mmol/L 4.1 4.1 4.3  Chloride 98 - 111 mmol/L 103 101 -  CO2 22 - 32 mmol/L 28 29 -  Calcium 8.9 - 10.3 mg/dL 10.3 9.9 -  Total Protein 6.5 - 8.1 g/dL 7.0 6.5 -  Total Bilirubin 0.3 - 1.2 mg/dL 0.7 0.6 -  Alkaline Phos 38 - 126 U/L 82 - -  AST 15 - 41 U/L 19 15 13   ALT 0 - 44 U/L 13 9 11       RADIOGRAPHIC STUDIES: I have personally reviewed the radiological images as listed and agreed with the findings in the report. No results found.   ASSESSMENT & PLAN:  FATEMAH POURCIAU is a 86 y.o. female with    1. Hemachromatosis: -She is currently being treated with as needed phlebotomies every 3 months. She is tolerating well. -If her ferritin >75or transferrin saturation>75%, I will recommend phlebotomy, I'll do 0.75 unit with normal saline 56mL infusion. Her iron study result is usually not available during her visit, and OK to do Phlebotomy based on her last lab results. Last phlebotomy in  11/2019 -Her 06/2018 US abdomenwas unremarkable.  -Labs reviewed, CBC and CMP WNL. Iron 173, sat ratios 75. Will proceed with phlebotomy today (05/10/20). She is agreeable.  -She is concerned about her hair loss, skin pigmentation and nail change which are possible related to iron overload, and that she does feel better overall after phlebotomy.  I discussed repeating labs more frequently every 4 months and based on labs will set up phlebotomy.  -F/u in 12 months    2. Osteoporosis -Followed by Dr. Dewaine Oats  3. Neuropathy in feet, low back pain  -She ambulates with cane.  -I encouraged her to watch for fall and to continue walking and being active at home.  -She notes she almost fell 3-4 weeks ago (04/2020). She was able to catch herself but resulted in a right hip contusions with pain. Her pain has persisted since.  -For pain she has only been on Tylenol.  -She will continue to f/u with orthopedist Dr Lurline Idol  4. Skin Lesions  -She has had multiple skin lesions, some squamous cell, that are removed as needed by her dermatologist.    PLAN -Proceed with phlebotomy today  -Lab, phlebotomy every 4 months X3 -F/u in 12 months   No problem-specific Assessment & Plan notes found for this encounter.   No orders of the defined types were placed in this encounter.  All questions were answered. The patient knows to call the clinic with any problems, questions or concerns. No barriers to learning was detected. The total time spent in the appointment was 25 minutes.     Truitt Merle, MD 05/10/2020   I, Joslyn Devon, am acting as scribe for Truitt Merle, MD.   I have reviewed the above documentation for accuracy and completeness, and I agree with the above.

## 2020-05-10 ENCOUNTER — Other Ambulatory Visit: Payer: Self-pay

## 2020-05-10 ENCOUNTER — Other Ambulatory Visit: Payer: Medicare HMO

## 2020-05-10 ENCOUNTER — Inpatient Hospital Stay: Payer: Medicare HMO | Admitting: Hematology

## 2020-05-10 ENCOUNTER — Ambulatory Visit: Payer: Medicare HMO | Admitting: Hematology

## 2020-05-10 ENCOUNTER — Inpatient Hospital Stay: Payer: Medicare HMO

## 2020-05-10 ENCOUNTER — Inpatient Hospital Stay: Payer: Medicare HMO | Attending: Hematology

## 2020-05-10 DIAGNOSIS — M810 Age-related osteoporosis without current pathological fracture: Secondary | ICD-10-CM | POA: Diagnosis not present

## 2020-05-10 DIAGNOSIS — G629 Polyneuropathy, unspecified: Secondary | ICD-10-CM | POA: Diagnosis not present

## 2020-05-10 LAB — CBC WITH DIFFERENTIAL/PLATELET
Abs Immature Granulocytes: 0.03 10*3/uL (ref 0.00–0.07)
Basophils Absolute: 0 10*3/uL (ref 0.0–0.1)
Basophils Relative: 0 %
Eosinophils Absolute: 0.2 10*3/uL (ref 0.0–0.5)
Eosinophils Relative: 2 %
HCT: 37.6 % (ref 36.0–46.0)
Hemoglobin: 12.6 g/dL (ref 12.0–15.0)
Immature Granulocytes: 0 %
Lymphocytes Relative: 22 %
Lymphs Abs: 1.9 10*3/uL (ref 0.7–4.0)
MCH: 33.2 pg (ref 26.0–34.0)
MCHC: 33.5 g/dL (ref 30.0–36.0)
MCV: 99.2 fL (ref 80.0–100.0)
Monocytes Absolute: 0.6 10*3/uL (ref 0.1–1.0)
Monocytes Relative: 7 %
Neutro Abs: 5.9 10*3/uL (ref 1.7–7.7)
Neutrophils Relative %: 69 %
Platelets: 241 10*3/uL (ref 150–400)
RBC: 3.79 MIL/uL — ABNORMAL LOW (ref 3.87–5.11)
RDW: 12.1 % (ref 11.5–15.5)
WBC: 8.6 10*3/uL (ref 4.0–10.5)
nRBC: 0 % (ref 0.0–0.2)

## 2020-05-10 LAB — CMP (CANCER CENTER ONLY)
ALT: 13 U/L (ref 0–44)
AST: 19 U/L (ref 15–41)
Albumin: 4.1 g/dL (ref 3.5–5.0)
Alkaline Phosphatase: 82 U/L (ref 38–126)
Anion gap: 9 (ref 5–15)
BUN: 23 mg/dL (ref 8–23)
CO2: 28 mmol/L (ref 22–32)
Calcium: 10.3 mg/dL (ref 8.9–10.3)
Chloride: 103 mmol/L (ref 98–111)
Creatinine: 0.8 mg/dL (ref 0.44–1.00)
GFR, Est AFR Am: >60 mL/min (ref >60)
GFR, Estimated: >60 mL/min (ref >60)
Glucose, Bld: 94 mg/dL (ref 70–99)
Potassium: 4.1 mmol/L (ref 3.5–5.1)
Sodium: 140 mmol/L (ref 135–145)
Total Bilirubin: 0.7 mg/dL (ref 0.3–1.2)
Total Protein: 7 g/dL (ref 6.5–8.1)

## 2020-05-10 LAB — IRON AND TIBC
Iron: 173 ug/dL — ABNORMAL HIGH (ref 41–142)
Saturation Ratios: 76 % — ABNORMAL HIGH (ref 21–57)
TIBC: 229 ug/dL — ABNORMAL LOW (ref 236–444)
UIBC: 55 ug/dL — ABNORMAL LOW (ref 120–384)

## 2020-05-10 LAB — FERRITIN: Ferritin: 69 ng/mL (ref 11–307)

## 2020-05-10 MED ORDER — SODIUM CHLORIDE 0.9 % IV SOLN
Freq: Once | INTRAVENOUS | Status: AC
  Filled 2020-05-10: qty 250

## 2020-05-10 NOTE — Progress Notes (Signed)
Denise Lane presents today for phlebotomy per MD orders. Phlebotomy procedure started at 1225 and ended at 1238  526 cc removed.  Patient tolerated procedure well. IV needle removed intact. Hydration and snack provided  Pt. denies nausea/vomiting, no antiemetics needed at this time.

## 2020-05-10 NOTE — Patient Instructions (Signed)
Therapeutic Phlebotomy, Care After This sheet gives you information about how to care for yourself after your procedure. Your health care provider may also give you more specific instructions. If you have problems or questions, contact your health care provider. What can I expect after the procedure? After the procedure, it is common to have:  Light-headedness or dizziness. You may feel faint.  Nausea.  Tiredness (fatigue). Follow these instructions at home: Eating and drinking  Be sure to eat well-balanced meals for the next 24 hours.  Drink enough fluid to keep your urine pale yellow.  Avoid drinking alcohol on the day that you had the procedure. Activity   Return to your normal activities as told by your health care provider. Most people can go back to their normal activities right away.  Avoid activities that take a lot of effort for about 5 hours after the procedure. Athletes should avoid strenuous exercise for at least 12 hours.  Avoid heavy lifting or pulling for about 5 hours after the procedure. Do not lift anything that is heavier than 10 lb (4.5 kg).  Change positions slowly for the remainder of the day. This will help to prevent light-headedness or fainting.  If you feel light-headed, lie down until the feeling goes away. Needle insertion site care   Keep your bandage (dressing) dry. You can remove the bandage after about 5 hours or as told by your health care provider.  If you have bleeding from the needle insertion site, raise (elevate) your arm and press firmly on the site until the bleeding stops.  If you have bruising at the site, apply ice to the area: ? Remove the dressing. ? Put ice in a plastic bag. ? Place a towel between your skin and the bag. ? Leave the ice on for 20 minutes, 2-3 times a day for the first 24 hours.  If the swelling does not go away after 24 hours, apply a warm, moist cloth (warm compress) to the area for 20 minutes, 2-3 times a  day. General instructions  Do not use any products that contain nicotine or tobacco, such as cigarettes and e-cigarettes, for at least 30 minutes after the procedure.  Keep all follow-up visits as told by your health care provider. This is important. You may need to continue having regular therapeutic phlebotomy treatments as directed. Contact a health care provider if you:  Have redness, swelling, or pain at the needle insertion site.  Have fluid or blood coming from the needle insertion site.  Have pus or a bad smell coming from the needle insertion site.  Notice that the needle insertion site feels warm to the touch.  Feel light-headed, dizzy, or nauseous, and the feeling does not go away.  Have new bruising at the needle insertion site.  Feel weaker than normal.  Have a fever or chills. Get help right away if:  You faint.  You have chest pain.  You have trouble breathing.  You have severe nausea or vomiting. Summary  After the procedure, it is common to have some light-headedness, dizziness, nausea, or tiredness (fatigue).  Be sure to eat well-balanced meals for the next 24 hours. Drink enough fluid to keep your urine pale yellow.  Return to your normal activities as told by your health care provider.  Keep all follow-up visits as told by your health care provider. You may need to continue having regular therapeutic phlebotomy treatments as directed. This information is not intended to replace advice given to you   by your health care provider. Make sure you discuss any questions you have with your health care provider. Document Revised: 10/15/2017 Document Reviewed: 10/14/2017 Elsevier Patient Education  2020 Else[employer] Inc.   Rehydration, Adult Rehydration is the replacement of body fluids and salts and minerals (electrolytes) that are lost during dehydration. Dehydration is when there is not enough fluid or water in the body. This happens when you lose more fluids  than you take in. Common causes of dehydration include:  Vomiting.  Diarrhea.  Excessive sweating, such as from heat exposure or exercise.  Taking medicines that cause the body to lose excess fluid (diuretics).  Impaired kidney function.  Not drinking enough fluid.  Certain illnesses or infections.  Certain poorly controlled long-term (chronic) illnesses, such as diabetes, heart disease, and kidney disease.  Symptoms of mild dehydration may include thirst, dry lips and mouth, dry skin, and dizziness. Symptoms of severe dehydration may include increased heart rate, confusion, fainting, and not urinating. You can rehydrate by drinking certain fluids or getting fluids through an IV tube, as told by your health care provider. What are the risks? Generally, rehydration is safe. However, one problem that can happen is taking in too much fluid (overhydration). This is rare. If overhydration happens, it can cause an electrolyte imbalance, kidney failure, or a decrease in salt (sodium) levels in the body. How to rehydrate Follow instructions from your health care provider for rehydration. The kind of fluid you should drink and the amount you should drink depend on your condition.  If directed by your health care provider, drink an oral rehydration solution (ORS). This is a drink designed to treat dehydration that is found in pharmacies and retail stores. ? Make an ORS by following instructions on the package. ? Start by drinking small amounts, about  cup (120 mL) every 5-10 minutes. ? Slowly increase how much you drink until you have taken the amount recommended by your health care provider.  Drink enough clear fluids to keep your urine clear or pale yellow. If you were instructed to drink an ORS, finish the ORS first, then start slowly drinking other clear fluids. Drink fluids such as: ? Water. Do not drink only water. Doing that can lead to having too little sodium in your body  (hyponatremia). ? Ice chips. ? Fruit juice that you have added water to (diluted juice). ? Low-calorie sports drinks.  If you are severely dehydrated, your health care provider may recommend that you receive fluids through an IV tube in the hospital.  Do not take sodium tablets. Doing that can lead to the condition of having too much sodium in your body (hypernatremia). Eating while you rehydrate Follow instructions from your health care provider about what to eat while you rehydrate. Your health care provider may recommend that you slowly begin eating regular foods in small amounts.  Eat foods that contain a healthy balance of electrolytes, such as bananas, oranges, potatoes, tomatoes, and spinach.  Avoid foods that are greasy or contain a lot of fat or sugar.  In some cases, you may get nutrition through a feeding tube that is passed through your nose and into your stomach (nasogastric tube, or NG tube). This may be done if you have uncontrolled vomiting or diarrhea. Beverages to avoid Certain beverages may make dehydration worse. While you rehydrate, avoid:  Alcohol.  Caffeine.  Drinks that contain a lot of sugar. These include: ? High-calorie sports drinks. ? Fruit juice that is not diluted. ? Soda.    Check nutrition labels to see how much sugar or caffeine a beverage contains. Signs of dehydration recovery You may be recovering from dehydration if:  You are urinating more often than before you started rehydrating.  Your urine is clear or pale yellow.  Your energy level improves.  You vomit less frequently.  You have diarrhea less frequently.  Your appetite improves or returns to normal.  You feel less dizzy or less light-headed.  Your skin tone and color start to look more normal. Contact a health care provider if:  You continue to have symptoms of mild dehydration, such as: ? Thirst. ? Dry lips. ? Slightly dry mouth. ? Dry, warm skin. ? Dizziness.  You  continue to vomit or have diarrhea. Get help right away if:  You have symptoms of dehydration that get worse.  You feel: ? Confused. ? Weak. ? Like you are going to faint.  You have not urinated in 6-8 hours.  You have very dark urine.  You have trouble breathing.  Your heart rate while sitting still is over 100 beats a minute.  You cannot drink fluids without vomiting.  You have vomiting or diarrhea that: ? Gets worse. ? Does not go away.  You have a fever. This information is not intended to replace advice given to you by your health care provider. Make sure you discuss any questions you have with your health care provider. Document Revised: 09/10/2017 Document Reviewed: 11/22/2015 Elsevier Patient Education  2020 Elsevier Inc.  

## 2020-05-11 ENCOUNTER — Encounter: Payer: Self-pay | Admitting: Hematology

## 2020-05-11 LAB — AFP TUMOR MARKER: AFP, Serum, Tumor Marker: <0.9 ng/mL (ref 0.0–8.3)

## 2020-05-13 ENCOUNTER — Telehealth: Payer: Self-pay | Admitting: Hematology

## 2020-05-13 ENCOUNTER — Telehealth: Payer: Self-pay

## 2020-05-13 NOTE — Telephone Encounter (Signed)
Scheduled per 7/30 los. Spoke with pt's daughter. Pt could only come in on fridays and unable to come in the day after thanksgiving. Pt is aware of appt time and date.

## 2020-05-13 NOTE — Telephone Encounter (Signed)
-----   Message from Truitt Merle, MD sent at 05/12/2020  2:19 PM EDT ----- Please let her daughter know her lab results, her iron level was high and she had phlebotomy last week, other labs including AFP (liver cancern screening due to her hemochromatosis) were normal. Pt is concerned about her hair loss, skin and nail changes are related to hemochromatosis and does feel better after phlebotomy, so I changed her lab and phlebotomy from twice a year to three times a year (every 4 months), let her know.   Thanks  Truitt Merle

## 2020-05-13 NOTE — Telephone Encounter (Signed)
I called and spoke with Ms. Remmel daughter [name] and informed her that the only abnormal lab was her iron it was high but the other labs including AFP was normal. Also Informed [name] that Dr. Burr Medico changed Ms. Matzke Phlebotomy from twice a year to three times a year (Every 4 months). [name] verbalized understanding.

## 2020-05-28 ENCOUNTER — Other Ambulatory Visit: Payer: Self-pay | Admitting: Nurse Practitioner

## 2020-06-17 ENCOUNTER — Other Ambulatory Visit: Payer: Self-pay | Admitting: Nurse Practitioner

## 2020-07-04 ENCOUNTER — Other Ambulatory Visit: Payer: Self-pay | Admitting: Nurse Practitioner

## 2020-07-04 ENCOUNTER — Other Ambulatory Visit: Payer: Self-pay

## 2020-07-04 ENCOUNTER — Ambulatory Visit (INDEPENDENT_AMBULATORY_CARE_PROVIDER_SITE_OTHER): Payer: Medicare HMO | Admitting: Nurse Practitioner

## 2020-07-04 ENCOUNTER — Encounter: Payer: Self-pay | Admitting: Nurse Practitioner

## 2020-07-04 ENCOUNTER — Telehealth: Payer: Self-pay

## 2020-07-04 DIAGNOSIS — Z0000 Encounter for general adult medical examination without abnormal findings: Secondary | ICD-10-CM

## 2020-07-04 NOTE — Telephone Encounter (Signed)
[name], grieves are scheduled for a virtual visit with your provider today.    Just as we do with appointments in the office, we must obtain your consent to participate.  Your consent will be active for this visit and any virtual visit you may have with one of our providers in the next 365 days.    If you have a MyChart account, I can also send a copy of this consent to you electronically.  All virtual visits are billed to your insurance company just like a traditional visit in the office.  As this is a virtual visit, video technology does not allow for your provider to perform a traditional examination.  This may limit your provider's ability to fully assess your condition.  If your provider identifies any concerns that need to be evaluated in person or the need to arrange testing such as labs, EKG, etc, we will make arrangements to do so.    Although advances in technology are sophisticated, we cannot ensure that it will always work on either your end or our end.  If the connection with a video visit is poor, we may have to switch to a telephone visit.  With either a video or telephone visit, we are not always able to ensure that we have a secure connection.   I need to obtain your verbal consent now.   Are you willing to proceed with your visit today?   Denise Lane has provided verbal consent on 07/04/2020 for a virtual visit (video or telephone).   [name], CMA 07/04/2020  1:20 PM

## 2020-07-04 NOTE — Patient Instructions (Signed)
Denise Lane , Thank you for taking time to come for your Medicare Wellness Visit. I appreciate your ongoing commitment to your health goals. Please review the following plan we discussed and let me know if I can assist you in the future.   Screening recommendations/referrals: Colonoscopy aged out Mammogram aged out Bone Density you have declined further screening Recommended yearly ophthalmology/optometry visit for glaucoma screening and checkup Recommended yearly dental visit for hygiene and checkup  Vaccinations: Influenza vaccine up to date Pneumococcal vaccine up to date Tdap vaccine up to date Shingles vaccine up to date    Advanced directives: on file.   Conditions/risks identified: risk of falls, weight loss, advance age  Next appointment: 1 year.    Preventive Care 20 Years and Older, Female Preventive care refers to lifestyle choices and visits with your health care provider that can promote health and wellness. What does preventive care include?  A yearly physical exam. This is also called an annual well check.  Dental exams once or twice a year.  Routine eye exams. Ask your health care provider how often you should have your eyes checked.  Personal lifestyle choices, including:  Daily care of your teeth and gums.  Regular physical activity.  Eating a healthy diet.  Avoiding tobacco and drug use.  Limiting alcohol use.  Practicing safe sex.  Taking low-dose aspirin every day.  Taking vitamin and mineral supplements as recommended by your health care provider. What happens during an annual well check? The services and screenings done by your health care provider during your annual well check will depend on your age, overall health, lifestyle risk factors, and family history of disease. Counseling  Your health care provider may ask you questions about your:  Alcohol use.  Tobacco use.  Drug use.  Emotional well-being.  Home and relationship  well-being.  Sexual activity.  Eating habits.  History of falls.  Memory and ability to understand (cognition).  Work and work Statistician.  Reproductive health. Screening  You may have the following tests or measurements:  Height, weight, and BMI.  Blood pressure.  Lipid and cholesterol levels. These may be checked every 5 years, or more frequently if you are over 27 years old.  Skin check.  Lung cancer screening. You may have this screening every year starting at age 4 if you have a 30-pack-year history of smoking and currently smoke or have quit within the past 15 years.  Fecal occult blood test (FOBT) of the stool. You may have this test every year starting at age 39.  Flexible sigmoidoscopy or colonoscopy. You may have a sigmoidoscopy every 5 years or a colonoscopy every 10 years starting at age 60.  Hepatitis C blood test.  Hepatitis B blood test.  Sexually transmitted disease (STD) testing.  Diabetes screening. This is done by checking your blood sugar (glucose) after you have not eaten for a while (fasting). You may have this done every 1-3 years.  Bone density scan. This is done to screen for osteoporosis. You may have this done starting at age 5.  Mammogram. This may be done every 1-2 years. Talk to your health care provider about how often you should have regular mammograms. Talk with your health care provider about your test results, treatment options, and if necessary, the need for more tests. Vaccines  Your health care provider may recommend certain vaccines, such as:  Influenza vaccine. This is recommended every year.  Tetanus, diphtheria, and acellular pertussis (Tdap, Td) vaccine. You may  need a Td booster every 10 years.  Zoster vaccine. You may need this after age 70.  Pneumococcal 13-valent conjugate (PCV13) vaccine. One dose is recommended after age 60.  Pneumococcal polysaccharide (PPSV23) vaccine. One dose is recommended after age  56. Talk to your health care provider about which screenings and vaccines you need and how often you need them. This information is not intended to replace advice given to you by your health care provider. Make sure you discuss any questions you have with your health care provider. Document Released: 10/25/2015 Document Revised: 06/17/2016 Document Reviewed: 07/30/2015 Elsevier Interactive Patient Education  2017 [place] Prevention in the Home Falls can cause injuries. They can happen to people of all ages. There are many things you can do to make your home safe and to help prevent falls. What can I do on the outside of my home?  Regularly fix the edges of walkways and driveways and fix any cracks.  Remove anything that might make you trip as you walk through a door, such as a raised step or threshold.  Trim any bushes or trees on the path to your home.  Use bright outdoor lighting.  Clear any walking paths of anything that might make someone trip, such as rocks or tools.  Regularly check to see if handrails are loose or broken. Make sure that both sides of any steps have handrails.  Any raised decks and porches should have guardrails on the edges.  Have any leaves, snow, or ice cleared regularly.  Use sand or salt on walking paths during winter.  Clean up any spills in your garage right away. This includes oil or grease spills. What can I do in the bathroom?  Use night lights.  Install grab bars by the toilet and in the tub and shower. Do not use towel bars as grab bars.  Use non-skid mats or decals in the tub or shower.  If you need to sit down in the shower, use a plastic, non-slip stool.  Keep the floor dry. Clean up any water that spills on the floor as soon as it happens.  Remove soap buildup in the tub or shower regularly.  Attach bath mats securely with double-sided non-slip rug tape.  Do not have throw rugs and other things on the floor that can make  you trip. What can I do in the bedroom?  Use night lights.  Make sure that you have a light by your bed that is easy to reach.  Do not use any sheets or blankets that are too big for your bed. They should not hang down onto the floor.  Have a firm chair that has side arms. You can use this for support while you get dressed.  Do not have throw rugs and other things on the floor that can make you trip. What can I do in the kitchen?  Clean up any spills right away.  Avoid walking on wet floors.  Keep items that you use a lot in easy-to-reach places.  If you need to reach something above you, use a strong step stool that has a grab bar.  Keep electrical cords out of the way.  Do not use floor polish or wax that makes floors slippery. If you must use wax, use non-skid floor wax.  Do not have throw rugs and other things on the floor that can make you trip. What can I do with my stairs?  Do not leave any items on  the stairs.  Make sure that there are handrails on both sides of the stairs and use them. Fix handrails that are broken or loose. Make sure that handrails are as long as the stairways.  Check any carpeting to make sure that it is firmly attached to the stairs. Fix any carpet that is loose or worn.  Avoid having throw rugs at the top or bottom of the stairs. If you do have throw rugs, attach them to the floor with carpet tape.  Make sure that you have a light switch at the top of the stairs and the bottom of the stairs. If you do not have them, ask someone to add them for you. What else can I do to help prevent falls?  Wear shoes that:  Do not have high heels.  Have rubber bottoms.  Are comfortable and fit you well.  Are closed at the toe. Do not wear sandals.  If you use a stepladder:  Make sure that it is fully opened. Do not climb a closed stepladder.  Make sure that both sides of the stepladder are locked into place.  Ask someone to hold it for you, if  possible.  Clearly mark and make sure that you can see:  Any grab bars or handrails.  First and last steps.  Where the edge of each step is.  Use tools that help you move around (mobility aids) if they are needed. These include:  Canes.  Walkers.  Scooters.  Crutches.  Turn on the lights when you go into a dark area. Replace any light bulbs as soon as they burn out.  Set up your furniture so you have a clear path. Avoid moving your furniture around.  If any of your floors are uneven, fix them.  If there are any pets around you, be aware of where they are.  Review your medicines with your doctor. Some medicines can make you feel dizzy. This can increase your chance of falling. Ask your doctor what other things that you can do to help prevent falls. This information is not intended to replace advice given to you by your health care provider. Make sure you discuss any questions you have with your health care provider. Document Released: 07/25/2009 Document Revised: 03/05/2016 Document Reviewed: 11/02/2014 Elsevier Interactive Patient Education  2017 [employer].

## 2020-07-04 NOTE — Progress Notes (Signed)
Subjective:   Denise Lane is a 86 y.o. female who presents for Medicare Annual (Subsequent) preventive examination.  Review of Systems     Cardiac Risk Factors include: advanced age (>65men, >52 women);hypertension;dyslipidemia     Objective:    Today's Vitals   07/04/20 1321  PainSc: 0-No pain   There is no height or weight on file to calculate BMI.  Advanced Directives 07/04/2020 05/10/2020 01/05/2020 07/20/2019 07/03/2019 06/27/2019 06/27/2019  Does Patient Have a Medical Advance Directive? Yes Yes Yes Yes Yes Yes Yes  Type of Paramedic of [place];Living will - Living will;Healthcare Power of [place];Living will [place];Living will Living will Living will  Does patient want to make changes to medical advance directive? No - Patient declined - No - Patient declined No - Patient declined No - Patient declined No - Patient declined -  Copy of [place] in Chart? Yes - validated most recent copy scanned in chart (See row information) - Yes - validated most recent copy scanned in chart (See row information) Yes - validated most recent copy scanned in chart (See row information) Yes - validated most recent copy scanned in chart (See row information) - -  Would patient like information on creating a medical advance directive? - - - - - - -  Pre-existing out of facility DNR order (yellow form or pink MOST form) - - - - - - -    Current Medications (verified) Outpatient Encounter Medications as of 07/04/2020  Medication Sig  . acetaminophen (TYLENOL) 650 MG CR tablet Take 650 mg by mouth 2 (two) times daily.   . calcium citrate (CALCITRATE - DOSED IN MG ELEMENTAL CALCIUM) 950 MG tablet Take 1 tablet by mouth daily.   . carboxymethylcellulose (REFRESH PLUS) 0.5 % SOLN Place 1 drop into both eyes 3 (three) times daily as needed. Uses more frequently in left eye as needed.  . cholecalciferol  (VITAMIN D) 1000 UNITS tablet Take 1,000 Units by mouth 2 (two) times daily.  Marland Kitchen gabapentin (NEURONTIN) 300 MG capsule TAKE 1 CAPSULE EVERY DAY  . Krill Oil 350 MG CAPS Take 1 capsule by mouth daily.  Marland Kitchen losartan (COZAAR) 50 MG tablet TAKE 1 TABLET EVERY DAY  . metoprolol tartrate (LOPRESSOR) 25 MG tablet TAKE 1 TABLET EVERY DAY  . oxybutynin (DITROPAN) 5 MG tablet TAKE 1 TABLET TWICE DAILY (SUBSTITUTED FOR DITROPAN)  . pantoprazole (PROTONIX) 40 MG tablet TAKE 1 TABLET EVERY DAY  . venlafaxine XR (EFFEXOR-XR) 37.5 MG 24 hr capsule TAKE 1 CAPSULE EVERY DAY WITH BREAKFAST   No facility-administered encounter medications on file as of 07/04/2020.    Allergies (verified) Codeine, Dilaudid [hydromorphone hcl], Macrodantin, Morphine and related, Sulfa antibiotics, and Ibuprofen   History: Past Medical History:  Diagnosis Date  . Basal cell cancer    "face, legs" (10/18/2012)  . Bruises easily   . Chronic lower back pain   . Depression    takes Effexor daily  . Diverticulosis of sigmoid colon 11-20-2009  . Dry eyes    uses Refresh eye drops daily as needed  . Dysphagia   . Gastric ulcer   . GERD (gastroesophageal reflux disease)    takes Omeprazole daily  . H/O hiatal hernia   . Hemochromatosis 1987  . Hemochromatosis   . Hemorrhoids, internal 11-20-09  . History of blood transfusion    "S/P colonoscopy after polyp removed; got 2 units; esophageal bleed got 1 unit;  really messed up hemochromatosis" (1/7/20214)  . History of bronchitis 1991-1996   "chronic; related to winter" (10/18/2012)  . History of colonic polyps    NOTED 11-20-09 IN COLONOSCOPY REPORT  . Hyperlipidemia    no meds required  . Hypertension    takes Metoprolol and Losartan daily  . Joint pain   . Joint swelling   . Muscle pain   . Muscle spasm    takes Robaxin daily as needed  . Occasional tremors   . Osteoarthritis   . Osteoarthritis of right hip 10/18/2012  . Osteoporosis   . Overactive bladder    takes Myrbetriq  daily  . PONV (postoperative nausea and vomiting)   . Rheumatic fever    hx of  . Squamous carcinoma    "LLE" (10/18/2012)  . Stenosis of esophagus   . Tremor, essential 10-29-09  . Urinary urgency    takes Ditropan daily  . Vomiting    occasionally   Past Surgical History:  Procedure Laterality Date  . APPENDECTOMY  1952  . CATARACT EXTRACTION W/ INTRAOCULAR LENS  IMPLANT, BILATERAL  2001   "both eyes" (10/18/2012)  . CHOLECYSTECTOMY  1977  . COLONOSCOPY    . DILATION AND CURETTAGE OF UTERUS  1975  . ESOPHAGOGASTRODUODENOSCOPY    . [place]  . SHOULDER ARTHROSCOPY W/ ROTATOR CUFF REPAIR  1998?   "left" (10/18/2012)  . SKIN CANCER EXCISION     "multiple" (10/18/2012)  . TONSILLECTOMY  1953  . TOTAL HIP ARTHROPLASTY  10/18/2012   "right" (10/18/2012)  . TOTAL HIP ARTHROPLASTY  10/18/2012   Procedure: TOTAL HIP ARTHROPLASTY;  Surgeon: Johnny Bridge, MD;  Location: [facility];  Service: Orthopedics;  Laterality: Right;  . TOTAL HIP ARTHROPLASTY Left 07/24/2014   dr Mardelle Matte  . TOTAL HIP ARTHROPLASTY Left 07/24/2014   Procedure: LEFT TOTAL HIP ARTHROPLASTY;  Surgeon: Johnny Bridge, MD;  Location: [place];  Service: Orthopedics;  Laterality: Left;  . TUBAL LIGATION  1975   Family History  Problem Relation Age of Onset  . Heart disease Mother   . Heart disease Father   . Hyperlipidemia Daughter   . Heart disease Brother   . Heart disease Brother   . Heart disease Brother   . Heart disease Brother   . Cancer Sister   . Melanoma Sister   . Alzheimer's disease Sister   . Heart disease Sister   . Osteoporosis Sister    Social History   Socioeconomic History  . Marital status: Widowed    Spouse name: Not on file  . Number of children: Not on file  . Years of education: Not on file  . Highest education level: Not on file  Occupational History  . Not on file  Tobacco Use  . Smoking status: Never Smoker  . Smokeless tobacco: Never Used  Vaping Use  . Vaping Use:  Never used  Substance and Sexual Activity  . Alcohol use: No  . Drug use: No  . Sexual activity: Never    Birth control/protection: Post-menopausal  Other Topics Concern  . Not on file  Social History Narrative  . Not on file   Social Determinants of Health   Financial Resource Strain:   . Difficulty of Paying Living Expenses: Not on file  Food Insecurity:   . Worried About Charity fundraiser in the Last Year: Not on file  . Ran Out of Food in the Last Year: Not on file  Transportation Needs:   .  Lack of Transportation (Medical): Not on file  . Lack of Transportation (Non-Medical): Not on file  Physical Activity:   . Days of Exercise per Week: Not on file  . Minutes of Exercise per Session: Not on file  Stress:   . Feeling of Stress : Not on file  Social Connections:   . Frequency of Communication with Friends and Family: Not on file  . Frequency of Social Gatherings with Friends and Family: Not on file  . Attends Religious Services: Not on file  . Active Member of Clubs or Organizations: Not on file  . Attends Archivist Meetings: Not on file  . Marital Status: Not on file    Tobacco Counseling Counseling given: Not Answered   Clinical Intake:  Pre-visit preparation completed: Yes  Pain : 0-10 Pain Score: 0-No pain Pain Type: Neuropathic pain     BMI - recorded: 18.55 Nutritional Status: BMI <19  Underweight Nutritional Risks: Unintentional weight loss Diabetes: No  How often do you need to have someone help you when you read instructions, pamphlets, or other written materials from your doctor or pharmacy?: 1 - Never  Diabetic?no         Activities of Daily Living In your present state of health, do you have any difficulty performing the following activities: 07/04/2020  Hearing? Y  Vision? Y  Difficulty concentrating or making decisions? N  Walking or climbing stairs? N  Dressing or bathing? N  Doing errands, shopping? Y  Preparing  Food and eating ? N  Using the Toilet? N  In the past six months, have you accidently leaked urine? Y  Do you have problems with loss of bowel control? N  Managing your Medications? N  Managing your Finances? N  Housekeeping or managing your Housekeeping? N  Some recent data might be hidden    Patient Care Team: Lauree Chandler, NP as PCP - General (Geriatric Medicine)  Indicate any recent Medical Services you may have received from other than [facility] providers in the past year (date may be approximate).     Assessment:   This is a routine wellness examination for [place].  Hearing/Vision screen  Hearing Screening   125Hz  250Hz  500Hz  1000Hz  2000Hz  3000Hz  4000Hz  6000Hz  8000Hz   Right ear:           Left ear:           Comments: Patient has some hearing problems.  Vision Screening Comments: Patient has been watched for macular degeneration. Patient has not had a recent eye exam.  Dietary issues and exercise activities discussed: Current Exercise Habits: Home exercise routine, Type of exercise: walking  Goals    . Gain weight     Starting 11/19/16, I will attempt to increase my weight by 10-15 lbs. I would like to weight around 130-135 lb.       Depression Screen PHQ 2/9 Scores 07/04/2020 06/27/2019 06/20/2018 12/02/2017 11/19/2016 11/21/2015 07/16/2015  PHQ - 2 Score 0 0 0 0 0 0 0    Fall Risk Fall Risk  07/04/2020 01/05/2020 07/20/2019 07/03/2019 06/27/2019  Falls in the past year? 0 0 0 0 0  Comment - - - - -  Number falls in past yr: 0 0 - 0 -  Injury with Fall? 0 0 - 0 -  Risk for fall due to : - - - - -    Any stairs in or around the home? Yes  If so, are there any without handrails? NO Home free  of loose throw rugs in walkways, pet beds, electrical cords, etc? Yes  Adequate lighting in your home to reduce risk of falls? Yes   ASSISTIVE DEVICES UTILIZED TO PREVENT FALLS:  Life alert? No  Use of a cane, walker or w/c? Yes  Grab bars in the bathroom? No  Shower chair or  bench in shower? No  Elevated toilet seat or a handicapped toilet? Yes   TIMED UP AND GO:  Was the test performed? No .  na Cognitive Function: MMSE - Mini Mental State Exam 06/20/2018 11/19/2016  Orientation to time 4 5  Orientation to Place 5 5  Registration 3 3  Attention/ Calculation 5 5  Recall 0 3  Language- name 2 objects 2 2  Language- repeat 1 1  Language- follow 3 step command 3 3  Language- read & follow direction 1 1  Write a sentence 1 1  Copy design 1 1  Total score 26 30     6CIT Screen 07/04/2020 06/27/2019  What Year? 0 points 0 points  What month? 0 points 0 points  What time? 0 points 0 points  Count back from 20 0 points 0 points  Months in reverse 0 points 0 points  Repeat phrase 4 points 0 points  Total Score 4 0    Immunizations Immunization History  Administered Date(s) Administered  . Fluad Quad(high Dose 65+) 07/03/2019  . Influenza, High Dose Seasonal PF 07/08/2016, 07/18/2017, 06/20/2018  . Influenza-Unspecified 06/12/2012, 07/04/2013, 06/23/2014, 07/09/2015  . Moderna SARS-COVID-2 Vaccination 11/25/2019, 12/23/2019  . Pneumococcal Conjugate-13 05/14/2016  . Pneumococcal Polysaccharide-23 07/25/2014  . Td 04/19/2007  . Tdap 05/27/2013  . Zoster 09/24/2011  . Zoster Recombinat (Shingrix) 05/24/2017, 08/23/2017    TDAP status: Up to date Flu Vaccine status: Up to date Pneumococcal vaccine status: Up to date Covid-19 vaccine status: Completed vaccines  Qualifies for Shingles Vaccine? Yes   Zostavax completed Yes   Shingrix Completed?: Yes  Screening Tests Health Maintenance  Topic Date Due  . INFLUENZA VACCINE  05/12/2020  . TETANUS/TDAP  05/28/2023  . DEXA SCAN  Completed  . COVID-19 Vaccine  Completed  . PNA vac Low Risk Adult  Completed    Health Maintenance  Health Maintenance Due  Topic Date Due  . INFLUENZA VACCINE  05/12/2020    Colorectal cancer screening: No longer required.  Mammogram status: No longer required.    Declines bone density  Lung Cancer Screening: (Low Dose CT Chest recommended if Age 5-80 years, 30 pack-year currently smoking OR have quit w/in 15years.) does not qualify.   Lung Cancer Screening Referral: na  Additional Screening:  Hepatitis C Screening: does not qualify; Completed na  Vision Screening: Recommended annual ophthalmology exams for early detection of glaucoma and other disorders of the eye. Is the patient up to date with their annual eye exam?  No  Who is the provider or what is the name of the office in which the patient attends annual eye exams? [facility] If pt is not established with a provider, would they like to be referred to a provider to establish care? No .   Dental Screening: Recommended annual dental exams for proper oral hygiene  Community Resource Referral / Chronic Care Management: CRR required this visit?  No   CCM required this visit?  No      Plan:     I have personally reviewed and noted the following in the patient's chart:   . Medical and social history . Use of alcohol,  tobacco or illicit drugs  . Current medications and supplements . Functional ability and status . Nutritional status . Physical activity . Advanced directives . List of other physicians . Hospitalizations, surgeries, and ER visits in previous 12 months . Vitals . Screenings to include cognitive, depression, and falls . Referrals and appointments  In addition, I have reviewed and discussed with patient certain preventive protocols, quality metrics, and best practice recommendations. A written personalized care plan for preventive services as well as general preventive health recommendations were provided to patient.     Lauree Chandler, NP   07/04/2020    Virtual Visit via Telephone Note  I connected with@ on 07/04/20 at  1:00 PM EDT by telephone and verified that I am speaking with the correct person using two identifiers.  Location: Patient: home Provider:  in office    I discussed the limitations, risks, security and privacy concerns of performing an evaluation and management service by telephone and the availability of in person appointments. I also discussed with the patient that there may be a patient responsible charge related to this service. The patient expressed understanding and agreed to proceed.   I discussed the assessment and treatment plan with the patient. The patient was provided an opportunity to ask questions and all were answered. The patient agreed with the plan and demonstrated an understanding of the instructions.   The patient was advised to call back or seek an in-person evaluation if the symptoms worsen or if the condition fails to improve as anticipated.  I provided 16 minutes of non-face-to-face time during this encounter.  Carlos American. Harle Battiest Avs printed and mailed

## 2020-07-04 NOTE — Progress Notes (Signed)
This service is provided via telemedicine  No vital signs collected/recorded due to the encounter was a telemedicine visit.   Location of patient (ex: home, work):  Home  Patient consents to a telephone visit:  Yes, see encounter dated 07/04/2020  Location of the provider (ex: office, home):  [facility]  Name of any referring provider:  N/A  Names of all persons participating in the telemedicine service and their role in the encounter:  Sherrie Mustache, Nurse Practitioner, [name], CMA, and patient.   Time spent on call:  10 minutes with medical assistant.

## 2020-07-05 ENCOUNTER — Encounter: Payer: Self-pay | Admitting: Nurse Practitioner

## 2020-07-05 ENCOUNTER — Telehealth (INDEPENDENT_AMBULATORY_CARE_PROVIDER_SITE_OTHER): Payer: Medicare HMO | Admitting: Nurse Practitioner

## 2020-07-05 ENCOUNTER — Other Ambulatory Visit: Payer: Self-pay

## 2020-07-05 DIAGNOSIS — M810 Age-related osteoporosis without current pathological fracture: Secondary | ICD-10-CM | POA: Diagnosis not present

## 2020-07-05 DIAGNOSIS — F419 Anxiety disorder, unspecified: Secondary | ICD-10-CM | POA: Diagnosis not present

## 2020-07-05 DIAGNOSIS — M159 Polyosteoarthritis, unspecified: Secondary | ICD-10-CM

## 2020-07-05 DIAGNOSIS — F32A Depression, unspecified: Secondary | ICD-10-CM

## 2020-07-05 DIAGNOSIS — G629 Polyneuropathy, unspecified: Secondary | ICD-10-CM

## 2020-07-05 DIAGNOSIS — F329 Major depressive disorder, single episode, unspecified: Secondary | ICD-10-CM

## 2020-07-05 DIAGNOSIS — K219 Gastro-esophageal reflux disease without esophagitis: Secondary | ICD-10-CM | POA: Diagnosis not present

## 2020-07-05 DIAGNOSIS — I110 Hypertensive heart disease with heart failure: Secondary | ICD-10-CM | POA: Diagnosis not present

## 2020-07-05 DIAGNOSIS — N3281 Overactive bladder: Secondary | ICD-10-CM | POA: Diagnosis not present

## 2020-07-05 NOTE — Progress Notes (Signed)
   This service is provided via telemedicine  No vital signs collected/recorded due to the encounter was a telemedicine visit.   Location of patient (ex: home, work): Home  Patient consents to a telephone visit:  Consent given see Telephone encounter 07/03/2020  Location of the provider (ex: office, home): [facility],   Name of any referring provider:  N/A  Names of all persons participating in the telemedicine service and their role in the encounter:  Marisa Cyphers RMA, Sherrie Mustache NP, Patient  Time spent on call:  10 min

## 2020-07-05 NOTE — Progress Notes (Signed)
Careteam: Patient Care Team: Lauree Chandler, NP as PCP - General (Geriatric Medicine)  Advanced Directive information    Allergies  Allergen Reactions   Codeine Nausea And Vomiting   Dilaudid [Hydromorphone Hcl] Hives and Other (See Comments)    "whelps" (10/18/2012)   Macrodantin Hives and Other (See Comments)    "drug fever; chills; felt terrible" (10/18/2012)   Morphine And Related Hives and Nausea And Vomiting   Sulfa Antibiotics Hives, Rash and Other (See Comments)    "got real sick" (10/18/2012)   Ibuprofen Other (See Comments)    "felt like I have to go to the bathroom constantly"    Chief Complaint  Patient presents with   Medical Management of Chronic Issues    6 Month Follow Up      HPI: Patient is a 86 y.o. female for routine follow up. Doing the best that she has done in a while. She lost her balance a few months ago and suffered from worsening pain to hip and back but this has improved now.   Neuropathy- controlled on gabapentin.   htn- has not taken blood pressure recently.   Hemochromatosis- following with hematologist now every 4 months. Had phlebotomy in July.   OA/pain- had an event that increased her pain but using voltaren gel to back which has been helping.   GERD- no increase in GERD on protonix   OAB- continues on oxybutynin.   Osteoporosis- continues on cal and vit d   Got flu shot in [facility]  Due for COVID booster  Uses Cane for support.  Review of Systems:  Review of Systems  Constitutional: Negative for chills, fever and weight loss.  HENT: Negative for tinnitus.   Respiratory: Negative for cough, sputum production and shortness of breath.   Cardiovascular: Negative for chest pain, palpitations and leg swelling.  Gastrointestinal: Negative for abdominal pain, constipation, diarrhea and heartburn.  Genitourinary: Positive for frequency. Negative for dysuria and urgency.  Musculoskeletal: Positive for back pain, joint  pain and myalgias. Negative for falls.  Skin: Negative.   Neurological: Positive for tingling and sensory change. Negative for dizziness and headaches.  Psychiatric/Behavioral: Negative for depression and memory loss. The patient does not have insomnia.     Past Medical History:  Diagnosis Date   Basal cell cancer    "face, legs" (10/18/2012)   Bruises easily    Chronic lower back pain    Depression    takes Effexor daily   Diverticulosis of sigmoid colon 11-20-2009   Dry eyes    uses Refresh eye drops daily as needed   Dysphagia    Gastric ulcer    GERD (gastroesophageal reflux disease)    takes Omeprazole daily   H/O hiatal hernia    Hemochromatosis 1987   Hemochromatosis    Hemorrhoids, internal 11-20-09   History of blood transfusion    "S/P colonoscopy after polyp removed; got 2 units; esophageal bleed got 1 unit; really messed up hemochromatosis" (1/7/20214)   History of bronchitis 1991-1996   "chronic; related to winter" (10/18/2012)   History of colonic polyps    NOTED 11-20-09 IN COLONOSCOPY REPORT   Hyperlipidemia    no meds required   Hypertension    takes Metoprolol and Losartan daily   Joint pain    Joint swelling    Muscle pain    Muscle spasm    takes Robaxin daily as needed   Occasional tremors    Osteoarthritis    Osteoarthritis of  right hip 10/18/2012   Osteoporosis    Overactive bladder    takes Myrbetriq daily   PONV (postoperative nausea and vomiting)    Rheumatic fever    hx of   Squamous carcinoma    "LLE" (10/18/2012)   Stenosis of esophagus    Tremor, essential 10-29-09   Urinary urgency    takes Ditropan daily   Vomiting    occasionally   Past Surgical History:  Procedure Laterality Date   APPENDECTOMY  1952   CATARACT EXTRACTION W/ INTRAOCULAR LENS  IMPLANT, BILATERAL  2001   "both eyes" (10/18/2012)   [place]?   "left" (10/18/2012)   SKIN CANCER EXCISION     "multiple" (10/18/2012)   [place] ARTHROPLASTY  10/18/2012   "right" (10/18/2012)   TOTAL HIP ARTHROPLASTY  10/18/2012   Procedure: TOTAL HIP ARTHROPLASTY;  Surgeon: Johnny Bridge, MD;  Location: [place];  Service: Orthopedics;  Laterality: Right;   TOTAL HIP ARTHROPLASTY Left 07/24/2014   dr Mardelle Matte   TOTAL HIP ARTHROPLASTY Left 07/24/2014   Procedure: LEFT TOTAL HIP ARTHROPLASTY;  Surgeon: Johnny Bridge, MD;  Location: [place];  Service: Orthopedics;  Laterality: Left;   TUBAL LIGATION  1975   Social History:   reports that she has never smoked. She has never used smokeless tobacco. She reports that she does not drink alcohol and does not use drugs.  Family History  Problem Relation Age of Onset   Heart disease Mother    Heart disease Father    Hyperlipidemia Daughter    Heart disease Brother    Heart disease Brother    Heart disease Brother    Heart disease Brother    Cancer Sister    Melanoma Sister    Alzheimer's disease Sister    Heart disease Sister    Osteoporosis Sister     Medications: Patient's Medications  New Prescriptions   No medications on file  Previous Medications   ACETAMINOPHEN (TYLENOL) 650 MG CR TABLET    Take 650 mg by mouth 2 (two) times daily.    CALCIUM CITRATE (CALCITRATE - DOSED IN MG ELEMENTAL CALCIUM) 950 MG TABLET    Take 1 tablet by mouth daily.    CARBOXYMETHYLCELLULOSE (REFRESH PLUS) 0.5 % SOLN    Place 1 drop into both eyes 3 (three) times daily as needed. Uses more frequently in left eye as needed.   CHOLECALCIFEROL (VITAMIN D) 1000 UNITS TABLET    Take 1,000 Units by mouth 2 (two) times daily.   GABAPENTIN (NEURONTIN) 300 MG CAPSULE    TAKE 1 CAPSULE EVERY DAY   KRILL OIL 350 MG CAPS    Take 1 capsule by mouth daily.   LOSARTAN (COZAAR) 50 MG  TABLET    TAKE 1 TABLET EVERY DAY   METOPROLOL TARTRATE (LOPRESSOR) 25 MG TABLET    TAKE 1 TABLET EVERY DAY   OXYBUTYNIN (DITROPAN) 5 MG TABLET    TAKE 1 TABLET TWICE DAILY (SUBSTITUTED FOR DITROPAN)   PANTOPRAZOLE (PROTONIX) 40 MG TABLET    TAKE 1 TABLET EVERY DAY   VENLAFAXINE XR (EFFEXOR-XR) 37.5 MG 24 HR CAPSULE    TAKE 1 CAPSULE EVERY DAY WITH BREAKFAST  Modified Medications   No medications on file  Discontinued  Medications   No medications on file    Physical Exam:  There were no vitals filed for this visit. There is no height or weight on file to calculate BMI. Wt Readings from Last 3 Encounters:  05/10/20 115 lb (52.2 kg)  01/05/20 118 lb (53.5 kg)  07/20/19 123 lb 9.6 oz (56.1 kg)     Labs reviewed: Basic Metabolic Panel: Recent Labs    05/10/20 0952  NA 140  K 4.1  CL 103  CO2 28  GLUCOSE 94  BUN 23  CREATININE 0.80  CALCIUM 10.3   Liver Function Tests: Recent Labs    05/10/20 0952  AST 19  ALT 13  ALKPHOS 82  BILITOT 0.7  PROT 7.0  ALBUMIN 4.1   No results for input(s): LIPASE, AMYLASE in the last 8760 hours. No results for input(s): AMMONIA in the last 8760 hours. CBC: Recent Labs    11/10/19 1152 05/10/20 0952  WBC 6.7 8.6  NEUTROABS 3.3 5.9  HGB 12.9 12.6  HCT 39.9 37.6  MCV 101.8* 99.2  PLT 263 241   Lipid Panel: No results for input(s): CHOL, HDL, LDLCALC, TRIG, CHOLHDL, LDLDIRECT in the last 8760 hours. TSH: No results for input(s): TSH in the last 8760 hours. A1C: No results found for: HGBA1C   Assessment/Plan 1. Hereditary hemochromatosis ([place]) Stable, continues to follow up with hematology for phlebotomy   2. Hypertensive heart disease with heart failure (HCC) -stable, continues on metoprolol 25 mg daily with losartan, no shortness of breath, chest pains, or LE edema noted.   3. Gastroesophageal reflux disease without esophagitis Controlled on protonix daily  4. Neuropathy Stable on gabapentin qhs   5.  Osteoarthritis of multiple joints, unspecified osteoarthritis type Stable, no worsening of pain noted. Continues on tylenol twice daily   6. Age-related osteoporosis without current pathological fracture Continues on cal and vit d, does not wish to have any other treatment.   7. Overactive bladder Stable on oxybutynin  8. Anxiety and depression Stable on effexor   Next appt: 6 months Unita Detamore K. [place], [facility] [phone]    Virtual Visit via [place]  I connected with patient on 07/05/20 at  2:15 PM EDT by video and verified that I am speaking with the correct person using two identifiers.  Location: Patient: home Provider: [place]   I discussed the limitations, risks, security and privacy concerns of performing an evaluation and management service by telephone and the availability of in person appointments. I also discussed with the patient that there may be a patient responsible charge related to this service. The patient expressed understanding and agreed to proceed.   I discussed the assessment and treatment plan with the patient. The patient was provided an opportunity to ask questions and all were answered. The patient agreed with the plan and demonstrated an understanding of the instructions.   The patient was advised to call back or seek an in-person evaluation if the symptoms worsen or if the condition fails to improve as anticipated.  I provided 20 minutes of non-face-to-face time during this encounter.  Carlos American. [facility] printed and mailed

## 2020-07-15 ENCOUNTER — Encounter: Payer: Self-pay | Admitting: Nurse Practitioner

## 2020-07-15 DIAGNOSIS — I10 Essential (primary) hypertension: Secondary | ICD-10-CM

## 2020-07-16 MED ORDER — LOSARTAN POTASSIUM 50 MG PO TABS: 50.0000 mg | ORAL_TABLET | Freq: Every day | ORAL | 1 refills | Status: DC

## 2020-08-14 ENCOUNTER — Other Ambulatory Visit: Payer: Self-pay | Admitting: Nurse Practitioner

## 2020-08-14 DIAGNOSIS — F419 Anxiety disorder, unspecified: Secondary | ICD-10-CM

## 2020-09-05 ENCOUNTER — Other Ambulatory Visit: Payer: Self-pay | Admitting: Nurse Practitioner

## 2020-09-10 ENCOUNTER — Other Ambulatory Visit: Payer: Medicare HMO

## 2020-09-13 ENCOUNTER — Other Ambulatory Visit: Payer: Self-pay

## 2020-09-13 ENCOUNTER — Inpatient Hospital Stay: Payer: Medicare HMO | Attending: Hematology

## 2020-09-13 ENCOUNTER — Inpatient Hospital Stay: Payer: Medicare HMO

## 2020-09-13 LAB — CBC WITH DIFFERENTIAL/PLATELET
Abs Immature Granulocytes: 0.01 10*3/uL (ref 0.00–0.07)
Basophils Absolute: 0 10*3/uL (ref 0.0–0.1)
Basophils Relative: 0 %
Eosinophils Absolute: 0.1 10*3/uL (ref 0.0–0.5)
Eosinophils Relative: 1 %
HCT: 37.9 % (ref 36.0–46.0)
Hemoglobin: 12.4 g/dL (ref 12.0–15.0)
Immature Granulocytes: 0 %
Lymphocytes Relative: 29 %
Lymphs Abs: 2.1 10*3/uL (ref 0.7–4.0)
MCH: 32.4 pg (ref 26.0–34.0)
MCHC: 32.7 g/dL (ref 30.0–36.0)
MCV: 99 fL (ref 80.0–100.0)
Monocytes Absolute: 0.6 10*3/uL (ref 0.1–1.0)
Monocytes Relative: 9 %
Neutro Abs: 4.4 10*3/uL (ref 1.7–7.7)
Neutrophils Relative %: 61 %
Platelets: 223 10*3/uL (ref 150–400)
RBC: 3.83 MIL/uL — ABNORMAL LOW (ref 3.87–5.11)
RDW: 12.3 % (ref 11.5–15.5)
WBC: 7.2 10*3/uL (ref 4.0–10.5)
nRBC: 0 % (ref 0.0–0.2)

## 2020-09-13 LAB — IRON AND TIBC
Iron: 176 ug/dL — ABNORMAL HIGH (ref 41–142)
Saturation Ratios: 75 % — ABNORMAL HIGH (ref 21–57)
TIBC: 235 ug/dL — ABNORMAL LOW (ref 236–444)
UIBC: 59 ug/dL — ABNORMAL LOW (ref 120–384)

## 2020-09-13 LAB — FERRITIN: Ferritin: 51 ng/mL (ref 11–307)

## 2020-09-13 MED ORDER — SODIUM CHLORIDE 0.9 % IV SOLN
Freq: Once | INTRAVENOUS | Status: AC
  Filled 2020-09-13: qty 250

## 2020-09-13 NOTE — Patient Instructions (Signed)

## 2020-09-13 NOTE — Progress Notes (Signed)
Denise Lane presents today for phlebotomy per MD orders.  20g IV started in [place].  Phlebotomy procedure started at 1313 and ended at 1325, 385 grams removed.  RN administered 538ml NS per order.  Patient brought her own snack and beverage.  Observed for 30 minutes after procedure without any incident.  Patient tolerated procedure well.  IV needle removed intact.

## 2020-10-16 ENCOUNTER — Other Ambulatory Visit: Payer: Self-pay | Admitting: Nurse Practitioner

## 2020-10-16 DIAGNOSIS — I10 Essential (primary) hypertension: Secondary | ICD-10-CM

## 2020-12-13 ENCOUNTER — Other Ambulatory Visit: Payer: Self-pay | Admitting: Nurse Practitioner

## 2020-12-13 DIAGNOSIS — K219 Gastro-esophageal reflux disease without esophagitis: Secondary | ICD-10-CM

## 2021-01-03 ENCOUNTER — Ambulatory Visit: Payer: Medicare HMO | Admitting: Nurse Practitioner

## 2021-01-06 ENCOUNTER — Other Ambulatory Visit: Payer: Medicare HMO

## 2021-01-10 ENCOUNTER — Inpatient Hospital Stay: Payer: Medicare HMO | Attending: Hematology

## 2021-01-10 ENCOUNTER — Inpatient Hospital Stay: Payer: Medicare HMO

## 2021-01-10 ENCOUNTER — Other Ambulatory Visit: Payer: Self-pay

## 2021-01-10 LAB — FERRITIN: Ferritin: 44 ng/mL (ref 11–307)

## 2021-01-10 LAB — CBC WITH DIFFERENTIAL/PLATELET
Abs Immature Granulocytes: 0.02 10*3/uL (ref 0.00–0.07)
Basophils Absolute: 0 10*3/uL (ref 0.0–0.1)
Basophils Relative: 1 %
Eosinophils Absolute: 0.1 10*3/uL (ref 0.0–0.5)
Eosinophils Relative: 2 %
HCT: 36.2 % (ref 36.0–46.0)
Hemoglobin: 12.2 g/dL (ref 12.0–15.0)
Immature Granulocytes: 0 %
Lymphocytes Relative: 30 %
Lymphs Abs: 1.8 10*3/uL (ref 0.7–4.0)
MCH: 32.9 pg (ref 26.0–34.0)
MCHC: 33.7 g/dL (ref 30.0–36.0)
MCV: 97.6 fL (ref 80.0–100.0)
Monocytes Absolute: 0.7 10*3/uL (ref 0.1–1.0)
Monocytes Relative: 12 %
Neutro Abs: 3.4 10*3/uL (ref 1.7–7.7)
Neutrophils Relative %: 55 %
Platelets: 213 10*3/uL (ref 150–400)
RBC: 3.71 MIL/uL — ABNORMAL LOW (ref 3.87–5.11)
RDW: 11.9 % (ref 11.5–15.5)
WBC: 6 10*3/uL (ref 4.0–10.5)
nRBC: 0 % (ref 0.0–0.2)

## 2021-01-10 LAB — IRON AND TIBC
Iron: 165 ug/dL — ABNORMAL HIGH (ref 41–142)
Saturation Ratios: 68 % — ABNORMAL HIGH (ref 21–57)
TIBC: 243 ug/dL (ref 236–444)
UIBC: 78 ug/dL — ABNORMAL LOW (ref 120–384)

## 2021-01-11 LAB — AFP TUMOR MARKER: AFP, Serum, Tumor Marker: 1 ng/mL (ref 0.0–8.7)

## 2021-01-13 ENCOUNTER — Telehealth: Payer: Self-pay

## 2021-01-13 NOTE — Telephone Encounter (Signed)
Spoke with pts daughter made her aware lab results no phlebotomy and f/u as needed

## 2021-01-13 NOTE — Telephone Encounter (Signed)
-----   Message from Truitt Merle, MD sent at 01/12/2021  9:16 AM EDT ----- Please let pt know her lab results. She will not need phlebotomy on next visit (cancel if it's scheduled) but keep lab and f/u appointment next time, thanks   Truitt Merle  01/12/2021

## 2021-01-15 ENCOUNTER — Encounter: Payer: Self-pay | Admitting: Nurse Practitioner

## 2021-01-15 ENCOUNTER — Other Ambulatory Visit: Payer: Self-pay

## 2021-01-15 ENCOUNTER — Ambulatory Visit (INDEPENDENT_AMBULATORY_CARE_PROVIDER_SITE_OTHER): Payer: Medicare HMO | Admitting: Nurse Practitioner

## 2021-01-15 VITALS — BP 110/60 | HR 82 | Temp 97.7°F | Ht 66.0 in | Wt 114.2 lb

## 2021-01-15 DIAGNOSIS — I10 Essential (primary) hypertension: Secondary | ICD-10-CM

## 2021-01-15 DIAGNOSIS — K219 Gastro-esophageal reflux disease without esophagitis: Secondary | ICD-10-CM

## 2021-01-15 DIAGNOSIS — M159 Polyosteoarthritis, unspecified: Secondary | ICD-10-CM | POA: Diagnosis not present

## 2021-01-15 DIAGNOSIS — N3281 Overactive bladder: Secondary | ICD-10-CM | POA: Diagnosis not present

## 2021-01-15 DIAGNOSIS — F32A Depression, unspecified: Secondary | ICD-10-CM | POA: Diagnosis not present

## 2021-01-15 DIAGNOSIS — G629 Polyneuropathy, unspecified: Secondary | ICD-10-CM

## 2021-01-15 DIAGNOSIS — F419 Anxiety disorder, unspecified: Secondary | ICD-10-CM

## 2021-01-15 NOTE — Progress Notes (Signed)
Careteam: Patient Care Team: Lauree Chandler, NP as PCP - General (Geriatric Medicine)  PLACE OF SERVICE:  [place] Directive information Does Patient Have a Medical Advance Directive?: Yes, Type of Advance Directive: [place];Living will, Does patient want to make changes to medical advance directive?: No - Patient declined  Allergies  Allergen Reactions  . Codeine Nausea And Vomiting  . Dilaudid [Hydromorphone Hcl] Hives and Other (See Comments)    "whelps" (10/18/2012)  . Macrodantin Hives and Other (See Comments)    "drug fever; chills; felt terrible" (10/18/2012)  . Morphine And Related Hives and Nausea And Vomiting  . Sulfa Antibiotics Hives, Rash and Other (See Comments)    "got real sick" (10/18/2012)  . Ibuprofen Other (See Comments)    "felt like I have to go to the bathroom constantly"    Chief Complaint  Patient presents with  . Medical Management of Chronic Issues    6 month follow up.Patient having a lot of issues associated with Hemochromatosis      HPI: Patient is a 87 y.o. female for routine follow up.   Hemochromatosis- following every 4 months. Ferritin was low so they deferred   Weight loss- does not need 3 meals a day, does not get up until 10 and then has a schedule of things to do before she can eat.   Reports her "mind has gone crazy" since her husband died.   Osteoarthritis- continues on gabapentin and Effexor helps pain. Some days are better than others. Will go out in the yard and weed eat and spray for weeds  Had a fall a few weeks ago. No increase in pain.   Anxiety/depression- ongoing continues on effexor XR  GERD- no worsening of symptoms on protonix.     Review of Systems:  Review of Systems  Constitutional: Negative for chills, fever and weight loss.  HENT: Negative for tinnitus.   Respiratory: Negative for cough, sputum production and shortness of breath.   Cardiovascular: Negative for chest  pain, palpitations and leg swelling.  Gastrointestinal: Negative for abdominal pain, constipation, diarrhea and heartburn.  Genitourinary: Negative for dysuria, frequency and urgency.  Musculoskeletal: Positive for back pain, falls, joint pain and myalgias.  Skin: Negative.   Neurological: Negative for dizziness and headaches.  Psychiatric/Behavioral: Positive for depression. Negative for memory loss. The patient is nervous/anxious. The patient does not have insomnia.     Past Medical History:  Diagnosis Date  . Basal cell cancer    "face, legs" (10/18/2012)  . Bruises easily   . Chronic lower back pain   . Depression    takes Effexor daily  . Diverticulosis of sigmoid colon 11-20-2009  . Dry eyes    uses Refresh eye drops daily as needed  . Dysphagia   . Gastric ulcer   . GERD (gastroesophageal reflux disease)    takes Omeprazole daily  . H/O hiatal hernia   . Hemochromatosis 1987  . Hemochromatosis   . Hemorrhoids, internal 11-20-09  . History of blood transfusion    "S/P colonoscopy after polyp removed; got 2 units; esophageal bleed got 1 unit; really messed up hemochromatosis" (1/7/20214)  . History of bronchitis 1991-1996   "chronic; related to winter" (10/18/2012)  . History of colonic polyps    NOTED 11-20-09 IN COLONOSCOPY REPORT  . Hyperlipidemia    no meds required  . Hypertension    takes Metoprolol and Losartan daily  . Joint pain   . Joint swelling   .  Muscle pain   . Muscle spasm    takes Robaxin daily as needed  . Occasional tremors   . Osteoarthritis   . Osteoarthritis of right hip 10/18/2012  . Osteoporosis   . Overactive bladder    takes Myrbetriq daily  . PONV (postoperative nausea and vomiting)   . Rheumatic fever    hx of  . Squamous carcinoma    "LLE" (10/18/2012)  . Stenosis of esophagus   . Tremor, essential 10-29-09  . Urinary urgency    takes Ditropan daily  . Vomiting    occasionally   Past Surgical History:  Procedure Laterality Date  .  APPENDECTOMY  1952  . CATARACT EXTRACTION W/ INTRAOCULAR LENS  IMPLANT, BILATERAL  2001   "both eyes" (10/18/2012)  . CHOLECYSTECTOMY  1977  . COLONOSCOPY    . DILATION AND CURETTAGE OF UTERUS  1975  . ESOPHAGOGASTRODUODENOSCOPY    . [place]  . SHOULDER ARTHROSCOPY W/ ROTATOR CUFF REPAIR  1998?   "left" (10/18/2012)  . SKIN CANCER EXCISION     "multiple" (10/18/2012)  . TONSILLECTOMY  1953  . TOTAL HIP ARTHROPLASTY  10/18/2012   "right" (10/18/2012)  . TOTAL HIP ARTHROPLASTY  10/18/2012   Procedure: TOTAL HIP ARTHROPLASTY;  Surgeon: Johnny Bridge, MD;  Location: [facility];  Service: Orthopedics;  Laterality: Right;  . TOTAL HIP ARTHROPLASTY Left 07/24/2014   dr Mardelle Matte  . TOTAL HIP ARTHROPLASTY Left 07/24/2014   Procedure: LEFT TOTAL HIP ARTHROPLASTY;  Surgeon: Johnny Bridge, MD;  Location: [facility];  Service: Orthopedics;  Laterality: Left;  . TUBAL LIGATION  1975   Social History:   reports that she has never smoked. She has never used smokeless tobacco. She reports that she does not drink alcohol and does not use drugs.  Family History  Problem Relation Age of Onset  . Heart disease Mother   . Heart disease Father   . Hyperlipidemia Daughter   . Heart disease Brother   . Heart disease Brother   . Heart disease Brother   . Heart disease Brother   . Cancer Sister   . Melanoma Sister   . Alzheimer's disease Sister   . Heart disease Sister   . Osteoporosis Sister     Medications: Patient's Medications  New Prescriptions   No medications on file  Previous Medications   ACETAMINOPHEN (TYLENOL) 650 MG CR TABLET    Take 650 mg by mouth 2 (two) times daily.    CALCIUM CITRATE (CALCITRATE - DOSED IN MG ELEMENTAL CALCIUM) 950 MG TABLET    Take 1 tablet by mouth daily.    CARBOXYMETHYLCELLULOSE (REFRESH PLUS) 0.5 % SOLN    Place 1 drop into both eyes 3 (three) times daily as needed. Uses more frequently in left eye as needed.   CHOLECALCIFEROL (VITAMIN D) 1000 UNITS TABLET     Take 1,000 Units by mouth 2 (two) times daily.   GABAPENTIN (NEURONTIN) 300 MG CAPSULE    TAKE 1 CAPSULE EVERY DAY   KRILL OIL 350 MG CAPS    Take 1 capsule by mouth daily.   LOSARTAN (COZAAR) 50 MG TABLET    Take 1 tablet (50 mg total) by mouth daily.   METOPROLOL TARTRATE (LOPRESSOR) 25 MG TABLET    TAKE 1 TABLET EVERY DAY   OXYBUTYNIN (DITROPAN) 5 MG TABLET    TAKE 1 TABLET TWICE DAILY (SUBSTITUTED FOR DITROPAN)   PANTOPRAZOLE (PROTONIX) 40 MG TABLET    TAKE 1 TABLET EVERY  DAY   VENLAFAXINE XR (EFFEXOR-XR) 37.5 MG 24 HR CAPSULE    TAKE 1 CAPSULE EVERY DAY WITH BREAKFAST  Modified Medications   No medications on file  Discontinued Medications   No medications on file    Physical Exam:  Vitals:   01/15/21 1311  BP: 110/60  Pulse: 82  Temp: 97.7 F (36.5 C)  TempSrc: Temporal  SpO2: 97%  Weight: 114 lb 3.2 oz (51.8 kg)  Height: 5\' 6"  (1.676 m)   Body mass index is 18.43 kg/m. Wt Readings from Last 3 Encounters:  01/15/21 114 lb 3.2 oz (51.8 kg)  09/13/20 115 lb (52.2 kg)  05/10/20 115 lb (52.2 kg)    Physical Exam Constitutional:      General: She is not in acute distress.    Appearance: She is well-developed. She is not diaphoretic.     Comments: Thin female  HENT:     Head: Normocephalic and atraumatic.     Mouth/Throat:     Pharynx: No oropharyngeal exudate.  Eyes:     Conjunctiva/sclera: Conjunctivae normal.     Pupils: Pupils are equal, round, and reactive to light.  Cardiovascular:     Rate and Rhythm: Normal rate and regular rhythm.     Heart sounds: Normal heart sounds.  Pulmonary:     Effort: Pulmonary effort is normal.     Breath sounds: Normal breath sounds.  Abdominal:     General: Bowel sounds are normal.     Palpations: Abdomen is soft.  Musculoskeletal:        General: Tenderness (to lower extermeties) present.     Cervical back: Normal range of motion and neck supple.  Skin:    General: Skin is warm and dry.  Neurological:     Mental  Status: She is alert and oriented to person, place, and time. Mental status is at baseline.     Gait: Gait abnormal (slow, uses cane).     Labs reviewed: Basic Metabolic Panel: Recent Labs    05/10/20 0952  NA 140  K 4.1  CL 103  CO2 28  GLUCOSE 94  BUN 23  CREATININE 0.80  CALCIUM 10.3   Liver Function Tests: Recent Labs    05/10/20 0952  AST 19  ALT 13  ALKPHOS 82  BILITOT 0.7  PROT 7.0  ALBUMIN 4.1   No results for input(s): LIPASE, AMYLASE in the last 8760 hours. No results for input(s): AMMONIA in the last 8760 hours. CBC: Recent Labs    05/10/20 0952 09/13/20 1215 01/10/21 1146  WBC 8.6 7.2 6.0  NEUTROABS 5.9 4.4 3.4  HGB 12.6 12.4 12.2  HCT 37.6 37.9 36.2  MCV 99.2 99.0 97.6  PLT 241 223 213   Lipid Panel: No results for input(s): CHOL, HDL, LDLCALC, TRIG, CHOLHDL, LDLDIRECT in the last 8760 hours. TSH: No results for input(s): TSH in the last 8760 hours. A1C: No results found for: HGBA1C   Assessment/Plan 1. Essential hypertension -controlled on losartan 50 mg daily  2. Hereditary hemochromatosis ([place]) -continues to be followed by hematology and ferritin levels being monitored she is having phlebotomy as needed.  3. Gastroesophageal reflux disease without esophagitis Controlled on protonix 40 mg daily   4. Neuropathy Ongoing, continues gabapentin 300 mg daily.   5. Osteoarthritis of multiple joints, unspecified osteoarthritis type -stable, continue on tylenol BID  6. Overactive bladder -stable continues on oxybutynin 5 mg daily  7. Anxiety and depression -ongoing but stable on effexor 37.5 mg discussed increasing  medication but pt declines.   Next appt: 6 months.  Carlos American. [place], [facility] [phone]

## 2021-01-16 ENCOUNTER — Other Ambulatory Visit: Payer: Self-pay | Admitting: Hematology

## 2021-02-14 ENCOUNTER — Other Ambulatory Visit: Payer: Self-pay | Admitting: Nurse Practitioner

## 2021-02-14 DIAGNOSIS — I10 Essential (primary) hypertension: Secondary | ICD-10-CM

## 2021-02-14 DIAGNOSIS — F32A Depression, unspecified: Secondary | ICD-10-CM

## 2021-02-20 ENCOUNTER — Encounter: Payer: Self-pay | Admitting: Nurse Practitioner

## 2021-03-20 ENCOUNTER — Other Ambulatory Visit: Payer: Self-pay | Admitting: Nurse Practitioner

## 2021-05-08 ENCOUNTER — Other Ambulatory Visit: Payer: Medicare HMO

## 2021-05-08 ENCOUNTER — Ambulatory Visit: Payer: Medicare HMO | Admitting: Hematology

## 2021-05-09 ENCOUNTER — Other Ambulatory Visit: Payer: Medicare HMO

## 2021-05-09 ENCOUNTER — Inpatient Hospital Stay: Payer: Medicare HMO

## 2021-05-09 ENCOUNTER — Other Ambulatory Visit: Payer: Self-pay

## 2021-05-09 ENCOUNTER — Ambulatory Visit: Payer: Medicare HMO

## 2021-05-09 ENCOUNTER — Inpatient Hospital Stay: Payer: Medicare HMO | Attending: Hematology | Admitting: Hematology

## 2021-05-09 DIAGNOSIS — K219 Gastro-esophageal reflux disease without esophagitis: Secondary | ICD-10-CM | POA: Diagnosis not present

## 2021-05-09 DIAGNOSIS — I10 Essential (primary) hypertension: Secondary | ICD-10-CM | POA: Insufficient documentation

## 2021-05-09 DIAGNOSIS — Z79899 Other long term (current) drug therapy: Secondary | ICD-10-CM | POA: Insufficient documentation

## 2021-05-09 LAB — CBC WITH DIFFERENTIAL/PLATELET
Abs Immature Granulocytes: 0.02 10*3/uL (ref 0.00–0.07)
Basophils Absolute: 0 10*3/uL (ref 0.0–0.1)
Basophils Relative: 0 %
Eosinophils Absolute: 0.1 10*3/uL (ref 0.0–0.5)
Eosinophils Relative: 2 %
HCT: 36 % (ref 36.0–46.0)
Hemoglobin: 12.1 g/dL (ref 12.0–15.0)
Immature Granulocytes: 0 %
Lymphocytes Relative: 32 %
Lymphs Abs: 1.9 10*3/uL (ref 0.7–4.0)
MCH: 33.4 pg (ref 26.0–34.0)
MCHC: 33.6 g/dL (ref 30.0–36.0)
MCV: 99.4 fL (ref 80.0–100.0)
Monocytes Absolute: 0.6 10*3/uL (ref 0.1–1.0)
Monocytes Relative: 9 %
Neutro Abs: 3.4 10*3/uL (ref 1.7–7.7)
Neutrophils Relative %: 57 %
Platelets: 208 10*3/uL (ref 150–400)
RBC: 3.62 MIL/uL — ABNORMAL LOW (ref 3.87–5.11)
RDW: 11.9 % (ref 11.5–15.5)
WBC: 6 10*3/uL (ref 4.0–10.5)
nRBC: 0 % (ref 0.0–0.2)

## 2021-05-09 LAB — CMP (CANCER CENTER ONLY)
ALT: 10 U/L (ref 0–44)
AST: 19 U/L (ref 15–41)
Albumin: 3.9 g/dL (ref 3.5–5.0)
Alkaline Phosphatase: 81 U/L (ref 38–126)
Anion gap: 9 (ref 5–15)
BUN: 30 mg/dL — ABNORMAL HIGH (ref 8–23)
CO2: 28 mmol/L (ref 22–32)
Calcium: 10.1 mg/dL (ref 8.9–10.3)
Chloride: 106 mmol/L (ref 98–111)
Creatinine: 0.78 mg/dL (ref 0.44–1.00)
GFR, Estimated: >60 mL/min (ref >60)
Glucose, Bld: 110 mg/dL — ABNORMAL HIGH (ref 70–99)
Potassium: 4.3 mmol/L (ref 3.5–5.1)
Sodium: 143 mmol/L (ref 135–145)
Total Bilirubin: 0.6 mg/dL (ref 0.3–1.2)
Total Protein: 6.7 g/dL (ref 6.5–8.1)

## 2021-05-09 LAB — FERRITIN: Ferritin: 57 ng/mL (ref 11–307)

## 2021-05-09 LAB — IRON AND TIBC
Iron: 214 ug/dL — ABNORMAL HIGH (ref 41–142)
Saturation Ratios: 94 % — ABNORMAL HIGH (ref 21–57)
TIBC: 228 ug/dL — ABNORMAL LOW (ref 236–444)
UIBC: 13 ug/dL — ABNORMAL LOW (ref 120–384)

## 2021-05-09 NOTE — Progress Notes (Signed)
[place]   [phone] [phone]   Clinic Follow up Note   Patient Care Team: Lauree Chandler, NP as PCP - General (Geriatric Medicine)  Date of Service:  05/09/2021  CHIEF COMPLAINT: F/u of Hereditary hemochromatosis   CURRENT THERAPY:  PRN phlebotomy to keep ferritin <=75, and transferrin saturation less than 75%   INTERVAL HISTORY:  Denise Lane is here for a follow up of Hereditary hemochromatosis. She was last seen by me 1 year ago. She presents to the clinic accompanied by her daughter. She reports pain in her lower body, from her waist down. She notes the pain alternates in her legs. She also notes she has overactive bladder, which has worsened more recently. She endorses taking oxybutynin because she cannot afford the meybetriq. Her daughter notes she wears a pad a lot of the time.  All other systems were reviewed with the patient and are negative.  MEDICAL HISTORY:  Past Medical History:  Diagnosis Date   Basal cell cancer    "face, legs" (10/18/2012)   Bruises easily    Chronic lower back pain    Depression    takes Effexor daily   Diverticulosis of sigmoid colon 11-20-2009   Dry eyes    uses Refresh eye drops daily as needed   Dysphagia    Gastric ulcer    GERD (gastroesophageal reflux disease)    takes Omeprazole daily   H/O hiatal hernia    Hemochromatosis 1987   Hemochromatosis    Hemorrhoids, internal 11-20-09   History of blood transfusion    "S/P colonoscopy after polyp removed; got 2 units; esophageal bleed got 1 unit; really messed up hemochromatosis" (1/7/20214)   History of bronchitis 1991-1996   "chronic; related to winter" (10/18/2012)   History of colonic polyps    NOTED 11-20-09 IN COLONOSCOPY REPORT   Hyperlipidemia    no meds required   Hypertension    takes Metoprolol and Losartan daily   Joint pain    Joint swelling    Muscle pain    Muscle spasm    takes Robaxin daily as needed   Occasional  tremors    Osteoarthritis    Osteoarthritis of right hip 10/18/2012   Osteoporosis    Overactive bladder    takes Myrbetriq daily   PONV (postoperative nausea and vomiting)    Rheumatic fever    hx of   Squamous carcinoma    "LLE" (10/18/2012)   Stenosis of esophagus    Tremor, essential 10-29-09   Urinary urgency    takes Ditropan daily   Vomiting    occasionally    SURGICAL HISTORY: Past Surgical History:  Procedure Laterality Date   APPENDECTOMY  1952   CATARACT EXTRACTION W/ INTRAOCULAR LENS  IMPLANT, BILATERAL  2001   "both eyes" (10/18/2012)   [place] ARTHROSCOPY W/ [place]?   "left" (10/18/2012)   SKIN CANCER EXCISION     "multiple" (10/18/2012)   [place]   TOTAL HIP ARTHROPLASTY  10/18/2012   "right" (10/18/2012)   TOTAL HIP ARTHROPLASTY  10/18/2012   Procedure: TOTAL HIP ARTHROPLASTY;  Surgeon: Johnny Bridge, MD;  Location: [facility];  Service: Orthopedics;  Laterality: Right;   TOTAL HIP ARTHROPLASTY Left 07/24/2014   dr Mardelle Matte   TOTAL HIP  ARTHROPLASTY Left 07/24/2014   Procedure: LEFT TOTAL HIP ARTHROPLASTY;  Surgeon: Johnny Bridge, MD;  Location: [facility];  Service: Orthopedics;  Laterality: Left;   TUBAL LIGATION  1975    I have reviewed the social history and family history with the patient and they are unchanged from previous note.  ALLERGIES:  is allergic to codeine, dilaudid [hydromorphone hcl], macrodantin, morphine and related, sulfa antibiotics, and ibuprofen.  MEDICATIONS:  Current Outpatient Medications  Medication Sig Dispense Refill   acetaminophen (TYLENOL) 650 MG CR tablet Take 650 mg by mouth 2 (two) times daily.      calcium citrate (CALCITRATE - DOSED IN MG ELEMENTAL CALCIUM) 950 MG tablet Take 1 tablet by mouth daily.      carboxymethylcellulose (REFRESH PLUS) 0.5 % SOLN Place 1 drop  into both eyes 3 (three) times daily as needed. Uses more frequently in left eye as needed.     cholecalciferol (VITAMIN D) 1000 UNITS tablet Take 1,000 Units by mouth 2 (two) times daily.     gabapentin (NEURONTIN) 300 MG capsule TAKE 1 CAPSULE EVERY DAY 90 capsule 1   Krill Oil 350 MG CAPS Take 1 capsule by mouth daily.     losartan (COZAAR) 50 MG tablet TAKE 1 TABLET EVERY DAY 90 tablet 1   metoprolol tartrate (LOPRESSOR) 25 MG tablet TAKE 1 TABLET EVERY DAY 90 tablet 2   oxybutynin (DITROPAN) 5 MG tablet TAKE 1 TABLET TWICE DAILY (SUBSTITUTED FOR DITROPAN) 180 tablet 1   pantoprazole (PROTONIX) 40 MG tablet TAKE 1 TABLET EVERY DAY 90 tablet 1   venlafaxine XR (EFFEXOR-XR) 37.5 MG 24 hr capsule TAKE 1 CAPSULE EVERY DAY WITH BREAKFAST 90 capsule 1   No current facility-administered medications for this visit.    PHYSICAL EXAMINATION: ECOG PERFORMANCE STATUS: 2 - Symptomatic, <50% confined to bed  There were no vitals filed for this visit.  There were no vitals filed for this visit.   Due to [place] we will limit examination to appearance. Patient had no complaints.  GENERAL:alert, no distress and comfortable SKIN: skin color normal, no rashes or significant lesions EYES: normal, Conjunctiva are pink and non-injected, sclera clear  NEURO: alert & oriented x 3 with fluent speech   LABORATORY DATA:  I have reviewed the data as listed CBC Latest Ref Rng & Units 05/09/2021 01/10/2021 09/13/2020  WBC 4.0 - 10.5 K/uL 6.0 6.0 7.2  Hemoglobin 12.0 - 15.0 g/dL 12.1 12.2 12.4  Hematocrit 36.0 - 46.0 % 36.0 36.2 37.9  Platelets 150 - 400 K/uL 208 213 223     CMP Latest Ref Rng & Units 05/10/2020 07/03/2019 01/27/2019  Glucose 70 - 99 mg/dL 94 96 -  BUN 8 - 23 mg/dL 23 26(H) 20  Creatinine 0.44 - 1.00 mg/dL 0.80 0.73 0.8  Sodium 135 - 145 mmol/L 140 139 141  Potassium 3.5 - 5.1 mmol/L 4.1 4.1 4.3  Chloride 98 - 111 mmol/L 103 101 -  CO2 22 - 32 mmol/L 28 29 -  Calcium 8.9 - 10.3 mg/dL 10.3  9.9 -  Total Protein 6.5 - 8.1 g/dL 7.0 6.5 -  Total Bilirubin 0.3 - 1.2 mg/dL 0.7 0.6 -  Alkaline Phos 38 - 126 U/L 82 - -  AST 15 - 41 U/L '19 15 13  '$ ALT 0 - 44 U/L '13 9 11      '$ RADIOGRAPHIC STUDIES: I have personally reviewed the radiological images as listed and agreed with the findings in the report. No results found.  ASSESSMENT & PLAN:  Denise Lane is a 87 y.o. female with    1. Hemachromatosis:  -She is currently being treated with as needed phlebotomies every 4 months. She is tolerating well. Due to her advanced age, we are use loose criteria for her phlebotomy  -If her ferritin >75 or transferrin saturation>75%, I will recommend phlebotomy, I'll do 0.75 unit with normal saline 500 mL infusion. Her iron study result is usually not available during her visit, and OK to do Phlebotomy based on her last lab results.  -Her lab results today are pending.  No phlebotomy today based on her last lab results. -Patient complains of severe arthralgia, not sure if is related to her hemochromatosis.  I do not think more frequent phlebotomy will improve her arthralgia, but I am okay to check her CBC more frequently.  After discussion with the patient and her daughter, they decided to keep the current every 4 months checkup for now. -We will continue to do labs every 4 months, with phlebotomy as needed. -F/u in 12 months    2. Osteoporosis -Followed by Dr. Dewaine Oats    3. Neuropathy in feet, low back pain  -She ambulates with cane.  -I encouraged her to watch for fall and to continue walking and being active at home.  -For pain she has only been on Tylenol.  -She will continue to f/u with orthopedist Dr Lurline Idol  4.  Severe arthralgia -I encouraged her to follow-up with orthopedics     PLAN  -No phlebotomy today based on last lab results  -lab, phlebotomy every 4 months X3, see phlebotomy criteria above -F/u in 12 months   No problem-specific Assessment & Plan notes found for  this encounter.   No orders of the defined types were placed in this encounter.  All questions were answered. The patient knows to call the clinic with any problems, questions or concerns. No barriers to learning was detected. The total time spent in the appointment was 20 minutes.     Truitt Merle, MD 05/09/2021   I, Wilburn Mylar, am acting as scribe for Truitt Merle, MD.   I have reviewed the above documentation for accuracy and completeness, and I agree with the above.

## 2021-05-11 ENCOUNTER — Encounter: Payer: Self-pay | Admitting: Hematology

## 2021-05-12 ENCOUNTER — Other Ambulatory Visit: Payer: Self-pay | Admitting: Nurse Practitioner

## 2021-05-12 ENCOUNTER — Telehealth: Payer: Self-pay | Admitting: Hematology

## 2021-05-12 DIAGNOSIS — K219 Gastro-esophageal reflux disease without esophagitis: Secondary | ICD-10-CM

## 2021-05-12 DIAGNOSIS — I10 Essential (primary) hypertension: Secondary | ICD-10-CM

## 2021-05-12 NOTE — Telephone Encounter (Signed)
Scheduled follow-up appointments per 7/29 los. Patient's daughter is aware.

## 2021-07-01 ENCOUNTER — Encounter: Payer: Self-pay | Admitting: Nurse Practitioner

## 2021-07-07 ENCOUNTER — Ambulatory Visit (INDEPENDENT_AMBULATORY_CARE_PROVIDER_SITE_OTHER): Payer: Medicare HMO | Admitting: Nurse Practitioner

## 2021-07-07 ENCOUNTER — Telehealth: Payer: Self-pay

## 2021-07-07 ENCOUNTER — Other Ambulatory Visit: Payer: Self-pay

## 2021-07-07 ENCOUNTER — Encounter: Payer: Self-pay | Admitting: Nurse Practitioner

## 2021-07-07 DIAGNOSIS — Z0000 Encounter for general adult medical examination without abnormal findings: Secondary | ICD-10-CM

## 2021-07-07 NOTE — Telephone Encounter (Addendum)
[name], kulaga are scheduled for a virtual visit with your provider today.    Just as we do with appointments in the office, we must obtain your consent to participate.  Your consent will be active for this visit and any virtual visit you may have with one of our providers in the next 365 days.    If you have a MyChart account, I can also send a copy of this consent to you electronically.  All virtual visits are billed to your insurance company just like a traditional visit in the office.  As this is a virtual visit, video technology does not allow for your provider to perform a traditional examination.  This may limit your provider's ability to fully assess your condition.  If your provider identifies any concerns that need to be evaluated in person or the need to arrange testing such as labs, EKG, etc, we will make arrangements to do so.    Although advances in technology are sophisticated, we cannot ensure that it will always work on either your end or our end.  If the connection with a video visit is poor, we may have to switch to a telephone visit.  With either a video or telephone visit, we are not always able to ensure that we have a secure connection.   I need to obtain your verbal consent now.   Are you willing to proceed with your visit today?   Denise Lane has provided verbal consent on 07/07/2021 for a virtual visit (video or telephone).   Leigh Aurora Norton, [place] 07/07/2021  2:05 pm

## 2021-07-07 NOTE — Progress Notes (Signed)
   This service is provided via telemedicine  No vital signs collected/recorded due to the encounter was a telemedicine visit.   Location of patient (ex: home, work):  Home  Patient consents to a telephone visit: Yes, see telephone visit dated 07/07/21  Location of the provider (ex: office, home):  [facility], Office   Name of any referring provider:  N/A  Names of all persons participating in the telemedicine service and their role in the encounter:  S.Chrae B/CMA, Sherrie Mustache, NP, and Patient   Time spent on call:  11 min with medical assistant

## 2021-07-07 NOTE — Progress Notes (Signed)
Subjective:   Denise Lane is a 87 y.o. female who presents for Medicare Annual (Subsequent) preventive examination.  Review of Systems     Cardiac Risk Factors include: advanced age (>31men, >72 women);hypertension;dyslipidemia     Objective:    There were no vitals filed for this visit. There is no height or weight on file to calculate BMI.  Advanced Directives 07/07/2021 07/07/2021 01/15/2021 09/13/2020 07/05/2020 07/04/2020 05/10/2020  Does Patient Have a Medical Advance Directive? Yes Yes Yes Yes Yes Yes Yes  Type of Paramedic of Protection;Living will [place];Living will [place];Living will [place];Living will [place];Living will [place];Living will -  Does patient want to make changes to medical advance directive? No - Patient declined No - Patient declined No - Patient declined No - Patient declined No - Patient declined No - Patient declined -  Copy of [place] in Chart? Yes - validated most recent copy scanned in chart (See row information) Yes - validated most recent copy scanned in chart (See row information) Yes - validated most recent copy scanned in chart (See row information) - Yes - validated most recent copy scanned in chart (See row information) Yes - validated most recent copy scanned in chart (See row information) -  Would patient like information on creating a medical advance directive? - - - - - - -  Pre-existing out of facility DNR order (yellow form or pink MOST form) - - - - - - -    Current Medications (verified) Outpatient Encounter Medications as of 07/07/2021  Medication Sig   acetaminophen (TYLENOL) 650 MG CR tablet Take 650 mg by mouth 2 (two) times daily.    carboxymethylcellulose (REFRESH PLUS) 0.5 % SOLN Place 1 drop into both eyes 3 (three) times daily as needed. Uses more frequently in left eye as needed.    cholecalciferol (VITAMIN D) 1000 UNITS tablet Take 1,000 Units by mouth 2 (two) times daily.   gabapentin (NEURONTIN) 300 MG capsule TAKE 1 CAPSULE EVERY DAY   Krill Oil 350 MG CAPS Take 1 capsule by mouth daily.   losartan (COZAAR) 50 MG tablet TAKE 1 TABLET EVERY DAY   metoprolol tartrate (LOPRESSOR) 25 MG tablet TAKE 1 TABLET EVERY DAY   oxybutynin (DITROPAN) 5 MG tablet TAKE 1 TABLET TWICE DAILY (SUBSTITUTED FOR DITROPAN)   pantoprazole (PROTONIX) 40 MG tablet TAKE 1 TABLET EVERY DAY   venlafaxine XR (EFFEXOR-XR) 37.5 MG 24 hr capsule TAKE 1 CAPSULE EVERY DAY WITH BREAKFAST   [DISCONTINUED] calcium citrate (CALCITRATE - DOSED IN MG ELEMENTAL CALCIUM) 950 MG tablet Take 1 tablet by mouth daily.  (Patient not taking: Reported on 07/07/2021)   No facility-administered encounter medications on file as of 07/07/2021.    Allergies (verified) Codeine, Dilaudid [hydromorphone hcl], Macrodantin, Morphine and related, Sulfa antibiotics, and Ibuprofen   History: Past Medical History:  Diagnosis Date   Basal cell cancer    "face, legs" (10/18/2012)   Bruises easily    Chronic lower back pain    Depression    takes Effexor daily   Diverticulosis of sigmoid colon 11-20-2009   Dry eyes    uses Refresh eye drops daily as needed   Dysphagia    Gastric ulcer    GERD (gastroesophageal reflux disease)    takes Omeprazole daily   H/O hiatal hernia    Hemochromatosis 1987   Hemochromatosis    Hemorrhoids, internal  11-20-09   History of blood transfusion    "S/P colonoscopy after polyp removed; got 2 units; esophageal bleed got 1 unit; really messed up hemochromatosis" (1/7/20214)   History of bronchitis 1991-1996   "chronic; related to winter" (10/18/2012)   History of colonic polyps    NOTED 11-20-09 IN COLONOSCOPY REPORT   Hyperlipidemia    no meds required   Hypertension    takes Metoprolol and Losartan daily   Joint pain    Joint swelling    Muscle pain    Muscle spasm    takes Robaxin daily  as needed   Occasional tremors    Osteoarthritis    Osteoarthritis of right hip 10/18/2012   Osteoporosis    Overactive bladder    takes Myrbetriq daily   PONV (postoperative nausea and vomiting)    Rheumatic fever    hx of   Squamous carcinoma    "LLE" (10/18/2012)   Stenosis of esophagus    Tremor, essential 10-29-09   Urinary urgency    takes Ditropan daily   Vomiting    occasionally   Past Surgical History:  Procedure Laterality Date   APPENDECTOMY  1952   CATARACT EXTRACTION W/ INTRAOCULAR LENS  IMPLANT, BILATERAL  2001   "both eyes" (10/18/2012)   [facility] ARTHROSCOPY W/ [place]?   "left" (10/18/2012)   SKIN CANCER EXCISION     "multiple" (10/18/2012)   [place]   TOTAL HIP ARTHROPLASTY  10/18/2012   "right" (10/18/2012)   TOTAL HIP ARTHROPLASTY  10/18/2012   Procedure: TOTAL HIP ARTHROPLASTY;  Surgeon: Johnny Bridge, MD;  Location: [place];  Service: Orthopedics;  Laterality: Right;   TOTAL HIP ARTHROPLASTY Left 07/24/2014   dr Mardelle Matte   TOTAL HIP ARTHROPLASTY Left 07/24/2014   Procedure: LEFT TOTAL HIP ARTHROPLASTY;  Surgeon: Johnny Bridge, MD;  Location: [place];  Service: Orthopedics;  Laterality: Left;   TUBAL LIGATION  1975   Family History  Problem Relation Age of Onset   Heart disease Mother    Heart disease Father    Hyperlipidemia Daughter    Heart disease Brother    Heart disease Brother    Heart disease Brother    Heart disease Brother    Cancer Sister    Melanoma Sister    Alzheimer's disease Sister    Heart disease Sister    Osteoporosis Sister    Social History   Socioeconomic History   Marital status: Widowed    Spouse name: Not on file   Number of children: Not on file   Years of education: Not on file   Highest education level: Not on file  Occupational History   Not on file   Tobacco Use   Smoking status: Never   Smokeless tobacco: Never  Vaping Use   Vaping Use: Never used  Substance and Sexual Activity   Alcohol use: No   Drug use: No   Sexual activity: Never    Birth control/protection: Post-menopausal  Other Topics Concern   Not on file  Social History Narrative   Not on file   Social Determinants of Health   Financial Resource Strain: Not on file  Food Insecurity: Not on file  Transportation Needs: Not on file  Physical Activity: Not on file  Stress: Not on  file  Social Connections: Not on file    Tobacco Counseling Counseling given: Not Answered   Clinical Intake:  Pre-visit preparation completed: Yes        BMI - recorded: 18 Nutritional Status: BMI <19  Underweight Nutritional Risks: None Diabetes: No  How often do you need to have someone help you when you read instructions, pamphlets, or other written materials from your doctor or pharmacy?: 1 - Never  Diabetic?no         Activities of Daily Living In your present state of health, do you have any difficulty performing the following activities: 07/07/2021  Hearing? N  Vision? N  Difficulty concentrating or making decisions? N  Walking or climbing stairs? Y  Dressing or bathing? N  Doing errands, shopping? N  Preparing Food and eating ? N  Using the Toilet? N  In the past six months, have you accidently leaked urine? Y  Do you have problems with loss of bowel control? N  Managing your Medications? N  Managing your Finances? Y  Comment daughter does  Runner, broadcasting/film/video? N  Some recent data might be hidden    Patient Care Team: Lauree Chandler, NP as PCP - General (Geriatric Medicine)  Indicate any recent Medical Services you may have received from other than [facility] providers in the past year (date may be approximate).     Assessment:   This is a routine wellness examination for [place].  Hearing/Vision screen Hearing  Screening - Comments:: Patient unsure if she has any hearing issues. Patient states she can not afford hearing aids.  Vision Screening - Comments:: Last eye exam greater than 12 months ago. Patient has a pending appointment in November.   Dietary issues and exercise activities discussed: Current Exercise Habits: Home exercise routine, Type of exercise: stretching, Time (Minutes): 15, Frequency (Times/Week): 7, Weekly Exercise (Minutes/Week): 105   Goals Addressed   None    Depression Screen PHQ 2/9 Scores 07/07/2021 07/05/2020 07/04/2020 06/27/2019 06/20/2018 12/02/2017 11/19/2016  PHQ - 2 Score 0 0 0 0 0 0 0    Fall Risk Fall Risk  07/07/2021 07/05/2020 07/04/2020 01/05/2020 07/20/2019  Falls in the past year? 0 0 0 0 0  Comment - - - - -  Number falls in past yr: 0 0 0 0 -  Injury with Fall? 0 0 0 0 -  Risk for fall due to : No Fall Risks - - - -  Follow up Falls evaluation completed - - - -    FALL RISK PREVENTION PERTAINING TO THE HOME:  Any stairs in or around the home? Yes  If so, are there any without handrails? No  Home free of loose throw rugs in walkways, pet beds, electrical cords, etc? Yes  Adequate lighting in your home to reduce risk of falls? Yes   ASSISTIVE DEVICES UTILIZED TO PREVENT FALLS:  Life alert? No  Use of a cane, walker or w/c? Yes  Grab bars in the bathroom? Yes  Shower chair or bench in shower? No  Elevated toilet seat or a handicapped toilet? Yes   TIMED UP AND GO:  Was the test performed? No .    Cognitive Function: MMSE - Mini Mental State Exam 06/20/2018 11/19/2016  Orientation to time 4 5  Orientation to Place 5 5  Registration 3 3  Attention/ Calculation 5 5  Recall 0 3  Language- name 2 objects 2 2  Language- repeat 1 1  Language- follow 3 step  command 3 3  Language- read & follow direction 1 1  Write a sentence 1 1  Copy design 1 1  Total score 26 30     6CIT Screen 07/07/2021 07/04/2020 06/27/2019  What Year? 0 points 0 points 0 points   What month? 0 points 0 points 0 points  What time? 0 points 0 points 0 points  Count back from 20 0 points 0 points 0 points  Months in reverse 0 points 0 points 0 points  Repeat phrase 2 points 4 points 0 points  Total Score 2 4 0    Immunizations Immunization History  Administered Date(s) Administered   Fluad Quad(high Dose 65+) 07/03/2019, 06/12/2020   Influenza, High Dose Seasonal PF 07/08/2016, 07/18/2017, 06/20/2018, 07/05/2021   Influenza-Unspecified 06/12/2012, 07/04/2013, 06/23/2014, 07/09/2015   Moderna SARS-COV2 Booster Vaccination 08/07/2020, 03/29/2021   Moderna Sars-Covid-2 Vaccination 11/25/2019, 12/23/2019   Pneumococcal Conjugate-13 05/14/2016   Pneumococcal Polysaccharide-23 07/25/2014   Td 04/19/2007   Tdap 05/27/2013   Zoster Recombinat (Shingrix) 05/24/2017, 08/23/2017   Zoster, Live 09/24/2011    TDAP status: Up to date  Flu Vaccine status: Due, Education has been provided regarding the importance of this vaccine. Advised may receive this vaccine at local pharmacy or Health Dept. Aware to provide a copy of the vaccination record if obtained from local pharmacy or Health Dept. Verbalized acceptance and understanding.  Pneumococcal vaccine status: Up to date  Covid-19 vaccine status: Information provided on how to obtain vaccines.   Qualifies for Shingles Vaccine? Yes   Zostavax completed Yes   Shingrix Completed?: Yes  Screening Tests Health Maintenance  Topic Date Due   TETANUS/TDAP  05/28/2023   INFLUENZA VACCINE  Completed   DEXA SCAN  Completed   COVID-19 Vaccine  Completed   Zoster Vaccines- Shingrix  Completed   HPV VACCINES  Aged Out    Health Maintenance  There are no preventive care reminders to display for this patient.  Colorectal cancer screening: No longer required.   Mammogram status: No longer required due to age.  Declines bone density   Lung Cancer Screening: (Low Dose CT Chest recommended if Age 85-80 years, 30  pack-year currently smoking OR have quit w/in 15years.) does not qualify.   Lung Cancer Screening Referral: na  Additional Screening:  Hepatitis C Screening: does not qualify; Completed na  Vision Screening: Recommended annual ophthalmology exams for early detection of glaucoma and other disorders of the eye. Is the patient up to date with their annual eye exam?  No  Who is the provider or what is the name of the office in which the patient attends annual eye exams? [facility]  If pt is not established with a provider, would they like to be referred to a provider to establish care? No .   Dental Screening: Recommended annual dental exams for proper oral hygiene  Community Resource Referral / Chronic Care Management: CRR required this visit?  No   CCM required this visit?  No      Plan:     I have personally reviewed and noted the following in the patient's chart:   Medical and social history Use of alcohol, tobacco or illicit drugs  Current medications and supplements including opioid prescriptions.  Functional ability and status Nutritional status Physical activity Advanced directives List of other physicians Hospitalizations, surgeries, and ER visits in previous 12 months Vitals Screenings to include cognitive, depression, and falls Referrals and appointments  In addition, I have reviewed and discussed with patient certain  preventive protocols, quality metrics, and best practice recommendations. A written personalized care plan for preventive services as well as general preventive health recommendations were provided to patient.     Lauree Chandler, NP   07/07/2021    Virtual Visit via Telephone Note  I connected withNAME@ on 07/07/21 at  2:15 PM EDT by telephone and verified that I am speaking with the correct person using two identifiers.  Location: Patient: home Provider: [facility]   I discussed the limitations, risks, security and privacy concerns of performing an  evaluation and management service by telephone and the availability of in person appointments. I also discussed with the patient that there may be a patient responsible charge related to this service. The patient expressed understanding and agreed to proceed.   I discussed the assessment and treatment plan with the patient. The patient was provided an opportunity to ask questions and all were answered. The patient agreed with the plan and demonstrated an understanding of the instructions.   The patient was advised to call back or seek an in-person evaluation if the symptoms worsen or if the condition fails to improve as anticipated.  I provided 15 minutes of non-face-to-face time during this encounter.  [employer]. Harle Battiest Avs printed and mailed

## 2021-07-07 NOTE — Patient Instructions (Signed)
[name] , Thank you for taking time to come for your Medicare Wellness Visit. I appreciate your ongoing commitment to your health goals. Please review the following plan we discussed and let me know if I can assist you in the future.   Screening recommendations/referrals: Colonoscopy aged out Mammogram aged out Bone Density declined Recommended yearly ophthalmology/optometry visit for glaucoma screening and checkup Recommended yearly dental visit for hygiene and checkup  Vaccinations: Influenza vaccine up to date Pneumococcal vaccine up to date Tdap vaccine up to date Shingles vaccine up to date   COVID booster- recommended to get at local pharmacy   Advanced directives: on file.   Conditions/risks identified: advance age, fall risk, increase pain, underweight.  Next appointment: 1 year for awv    Preventive Care 85 Years and Older, Female Preventive care refers to lifestyle choices and visits with your health care provider that can promote health and wellness. What does preventive care include? A yearly physical exam. This is also called an annual well check. Dental exams once or twice a year. Routine eye exams. Ask your health care provider how often you should have your eyes checked. Personal lifestyle choices, including: Daily care of your teeth and gums. Regular physical activity. Eating a healthy diet. Avoiding tobacco and drug use. Limiting alcohol use. Practicing safe sex. Taking low-dose aspirin every day. Taking vitamin and mineral supplements as recommended by your health care provider. What happens during an annual well check? The services and screenings done by your health care provider during your annual well check will depend on your age, overall health, lifestyle risk factors, and family history of disease. Counseling  Your health care provider may ask you questions about your: Alcohol use. Tobacco use. Drug use. Emotional well-being. Home and  relationship well-being. Sexual activity. Eating habits. History of falls. Memory and ability to understand (cognition). Work and work Statistician. Reproductive health. Screening  You may have the following tests or measurements: Height, weight, and BMI. Blood pressure. Lipid and cholesterol levels. These may be checked every 5 years, or more frequently if you are over 73 years old. Skin check. Lung cancer screening. You may have this screening every year starting at age 68 if you have a 30-pack-year history of smoking and currently smoke or have quit within the past 15 years. Fecal occult blood test (FOBT) of the stool. You may have this test every year starting at age 78. Flexible sigmoidoscopy or colonoscopy. You may have a sigmoidoscopy every 5 years or a colonoscopy every 10 years starting at age 46. Hepatitis C blood test. Hepatitis B blood test. Sexually transmitted disease (STD) testing. Diabetes screening. This is done by checking your blood sugar (glucose) after you have not eaten for a while (fasting). You may have this done every 1-3 years. Bone density scan. This is done to screen for osteoporosis. You may have this done starting at age 62. Mammogram. This may be done every 1-2 years. Talk to your health care provider about how often you should have regular mammograms. Talk with your health care provider about your test results, treatment options, and if necessary, the need for more tests. Vaccines  Your health care provider may recommend certain vaccines, such as: Influenza vaccine. This is recommended every year. Tetanus, diphtheria, and acellular pertussis (Tdap, Td) vaccine. You may need a Td booster every 10 years. Zoster vaccine. You may need this after age 90. Pneumococcal 13-valent conjugate (PCV13) vaccine. One dose is recommended after age 64. Pneumococcal polysaccharide (PPSV23)  vaccine. One dose is recommended after age 66. Talk to your health care provider  about which screenings and vaccines you need and how often you need them. This information is not intended to replace advice given to you by your health care provider. Make sure you discuss any questions you have with your health care provider. Document Released: 10/25/2015 Document Revised: 06/17/2016 Document Reviewed: 07/30/2015 Elsevier Interactive Patient Education  2017 [place] Prevention in the Home Falls can cause injuries. They can happen to people of all ages. There are many things you can do to make your home safe and to help prevent falls. What can I do on the outside of my home? Regularly fix the edges of walkways and driveways and fix any cracks. Remove anything that might make you trip as you walk through a door, such as a raised step or threshold. Trim any bushes or trees on the path to your home. Use bright outdoor lighting. Clear any walking paths of anything that might make someone trip, such as rocks or tools. Regularly check to see if handrails are loose or broken. Make sure that both sides of any steps have handrails. Any raised decks and porches should have guardrails on the edges. Have any leaves, snow, or ice cleared regularly. Use sand or salt on walking paths during winter. Clean up any spills in your garage right away. This includes oil or grease spills. What can I do in the bathroom? Use night lights. Install grab bars by the toilet and in the tub and shower. Do not use towel bars as grab bars. Use non-skid mats or decals in the tub or shower. If you need to sit down in the shower, use a plastic, non-slip stool. Keep the floor dry. Clean up any water that spills on the floor as soon as it happens. Remove soap buildup in the tub or shower regularly. Attach bath mats securely with double-sided non-slip rug tape. Do not have throw rugs and other things on the floor that can make you trip. What can I do in the bedroom? Use night lights. Make sure  that you have a light by your bed that is easy to reach. Do not use any sheets or blankets that are too big for your bed. They should not hang down onto the floor. Have a firm chair that has side arms. You can use this for support while you get dressed. Do not have throw rugs and other things on the floor that can make you trip. What can I do in the kitchen? Clean up any spills right away. Avoid walking on wet floors. Keep items that you use a lot in easy-to-reach places. If you need to reach something above you, use a strong step stool that has a grab bar. Keep electrical cords out of the way. Do not use floor polish or wax that makes floors slippery. If you must use wax, use non-skid floor wax. Do not have throw rugs and other things on the floor that can make you trip. What can I do with my stairs? Do not leave any items on the stairs. Make sure that there are handrails on both sides of the stairs and use them. Fix handrails that are broken or loose. Make sure that handrails are as long as the stairways. Check any carpeting to make sure that it is firmly attached to the stairs. Fix any carpet that is loose or worn. Avoid having throw rugs at the top or bottom  of the stairs. If you do have throw rugs, attach them to the floor with carpet tape. Make sure that you have a light switch at the top of the stairs and the bottom of the stairs. If you do not have them, ask someone to add them for you. What else can I do to help prevent falls? Wear shoes that: Do not have high heels. Have rubber bottoms. Are comfortable and fit you well. Are closed at the toe. Do not wear sandals. If you use a stepladder: Make sure that it is fully opened. Do not climb a closed stepladder. Make sure that both sides of the stepladder are locked into place. Ask someone to hold it for you, if possible. Clearly mark and make sure that you can see: Any grab bars or handrails. First and last steps. Where the edge of  each step is. Use tools that help you move around (mobility aids) if they are needed. These include: Canes. Walkers. Scooters. Crutches. Turn on the lights when you go into a dark area. Replace any light bulbs as soon as they burn out. Set up your furniture so you have a clear path. Avoid moving your furniture around. If any of your floors are uneven, fix them. If there are any pets around you, be aware of where they are. Review your medicines with your doctor. Some medicines can make you feel dizzy. This can increase your chance of falling. Ask your doctor what other things that you can do to help prevent falls. This information is not intended to replace advice given to you by your health care provider. Make sure you discuss any questions you have with your health care provider. Document Released: 07/25/2009 Document Revised: 03/05/2016 Document Reviewed: 11/02/2014 Elsevier Interactive Patient Education  2017 [employer].

## 2021-07-16 ENCOUNTER — Other Ambulatory Visit: Payer: Self-pay | Admitting: Nurse Practitioner

## 2021-08-01 ENCOUNTER — Encounter: Payer: Self-pay | Admitting: Nurse Practitioner

## 2021-08-01 ENCOUNTER — Other Ambulatory Visit: Payer: Self-pay

## 2021-08-01 ENCOUNTER — Ambulatory Visit (INDEPENDENT_AMBULATORY_CARE_PROVIDER_SITE_OTHER): Payer: Medicare HMO | Admitting: Nurse Practitioner

## 2021-08-01 VITALS — BP 140/72 | HR 68 | Temp 97.3°F | Ht 63.0 in | Wt 112.0 lb

## 2021-08-01 DIAGNOSIS — K219 Gastro-esophageal reflux disease without esophagitis: Secondary | ICD-10-CM

## 2021-08-01 DIAGNOSIS — F32A Depression, unspecified: Secondary | ICD-10-CM | POA: Diagnosis not present

## 2021-08-01 DIAGNOSIS — M159 Polyosteoarthritis, unspecified: Secondary | ICD-10-CM | POA: Diagnosis not present

## 2021-08-01 DIAGNOSIS — F419 Anxiety disorder, unspecified: Secondary | ICD-10-CM | POA: Diagnosis not present

## 2021-08-01 DIAGNOSIS — G629 Polyneuropathy, unspecified: Secondary | ICD-10-CM | POA: Diagnosis not present

## 2021-08-01 DIAGNOSIS — I10 Essential (primary) hypertension: Secondary | ICD-10-CM

## 2021-08-01 DIAGNOSIS — N3281 Overactive bladder: Secondary | ICD-10-CM

## 2021-08-01 NOTE — Progress Notes (Signed)
Careteam: Patient Care Team: Lauree Chandler, NP as PCP - General (Geriatric Medicine)  PLACE OF SERVICE:  [place] Directive information Does Patient Have a Medical Advance Directive?: Yes, Type of Advance Directive: [place];Living will, Does patient want to make changes to medical advance directive?: No - Patient declined  Allergies  Allergen Reactions   Codeine Nausea And Vomiting   Dilaudid [Hydromorphone Hcl] Hives and Other (See Comments)    "whelps" (10/18/2012)   Macrodantin Hives and Other (See Comments)    "drug fever; chills; felt terrible" (10/18/2012)   Morphine And Related Hives and Nausea And Vomiting   Sulfa Antibiotics Hives, Rash and Other (See Comments)    "got real sick" (10/18/2012)   Ibuprofen Other (See Comments)    "felt like I have to go to the bathroom constantly"    Chief Complaint  Patient presents with   Medical Management of Chronic Issues    6 month follow-up. Discuss need for covid # 3 or postpone/exclude if patient refuses. Here with daughter Jeannene Patella      HPI: Patient is a 87 y.o. female for routine follow up   Back pain ongoing rotates between ice and heating pad. Uses cane to help with ambulation also has rollator and walker. Takes tylenol BID. Can not tolerate other medication due to side effects. Taking Effexor and gabapentin with tylenol. When she increases gabapentin gait is more unsteady and more fatigue.   Osteoarthritis- continues tylenol and gabapentin also helps with pain  Hemochromatosis- follows up every 4 months with hematology, plan to do   Anxiety/depression- ongoing feels mood has improved. Continues Effexor XR 37.5 mg  HTN- BP controlled 140/72. Continues metoprolol and Losartan   Constipation drinks glass of hot water to help with bowel movements  Overactive bladder- decrease sleep due to increased urination wears pad and depends. Sometimes wakes up with bed soaked in the morning. Has  tried mybertic but did not notice any majors changes and felt the medication was too expensive.  Continues on oxybutynin.   Appetite stable eats 3 meals a day.Typically eats breakfast around 11:30-12:00.   Falls- No falls, but has had a few near miss events. Has to rise up slowly.   GERD- stable. Denies worsening symptoms    Review of Systems:  Review of Systems  Constitutional:  Negative for chills and fever.  HENT:  Negative for ear discharge.   Eyes:  Negative for pain.  Respiratory:  Negative for shortness of breath.   Cardiovascular:  Negative for chest pain and palpitations.  Gastrointestinal:  Positive for constipation. Negative for diarrhea, nausea and vomiting.  Genitourinary:  Positive for frequency. Negative for dysuria and urgency.  Musculoskeletal:  Positive for back pain, joint pain and myalgias. Negative for falls.  Skin:  Negative for rash.  Neurological:  Negative for dizziness, weakness and headaches.  Psychiatric/Behavioral:  The patient does not have insomnia.    Past Medical History:  Diagnosis Date   Basal cell cancer    "face, legs" (10/18/2012)   Bruises easily    Chronic lower back pain    Depression    takes Effexor daily   Diverticulosis of sigmoid colon 11-20-2009   Dry eyes    uses Refresh eye drops daily as needed   Dysphagia    Gastric ulcer    GERD (gastroesophageal reflux disease)    takes Omeprazole daily   H/O hiatal hernia    Hemochromatosis 1987   Hemochromatosis  Hemorrhoids, internal 11-20-09   History of blood transfusion    "S/P colonoscopy after polyp removed; got 2 units; esophageal bleed got 1 unit; really messed up hemochromatosis" (1/7/20214)   History of bronchitis 1991-1996   "chronic; related to winter" (10/18/2012)   History of colonic polyps    NOTED 11-20-09 IN COLONOSCOPY REPORT   Hyperlipidemia    no meds required   Hypertension    takes Metoprolol and Losartan daily   Joint pain    Joint swelling    Muscle pain     Muscle spasm    takes Robaxin daily as needed   Occasional tremors    Osteoarthritis    Osteoarthritis of right hip 10/18/2012   Osteoporosis    Overactive bladder    takes Myrbetriq daily   PONV (postoperative nausea and vomiting)    Rheumatic fever    hx of   Squamous carcinoma    "LLE" (10/18/2012)   Stenosis of esophagus    Tremor, essential 10-29-09   Urinary urgency    takes Ditropan daily   Vomiting    occasionally   Past Surgical History:  Procedure Laterality Date   APPENDECTOMY  1952   CATARACT EXTRACTION W/ INTRAOCULAR LENS  IMPLANT, BILATERAL  2001   "both eyes" (10/18/2012)   [place] ARTHROSCOPY W/ [place]?   "left" (10/18/2012)   SKIN CANCER EXCISION     "multiple" (10/18/2012)   [place]   TOTAL HIP ARTHROPLASTY  10/18/2012   "right" (10/18/2012)   TOTAL HIP ARTHROPLASTY  10/18/2012   Procedure: TOTAL HIP ARTHROPLASTY;  Surgeon: Johnny Bridge, MD;  Location: [facility];  Service: Orthopedics;  Laterality: Right;   TOTAL HIP ARTHROPLASTY Left 07/24/2014   dr Mardelle Matte   TOTAL HIP ARTHROPLASTY Left 07/24/2014   Procedure: LEFT TOTAL HIP ARTHROPLASTY;  Surgeon: Johnny Bridge, MD;  Location: [facility];  Service: Orthopedics;  Laterality: Left;   TUBAL LIGATION  1975   Social History:   reports that she has never smoked. She has never used smokeless tobacco. She reports that she does not drink alcohol and does not use drugs.  Family History  Problem Relation Age of Onset   Heart disease Mother    Heart disease Father    Hyperlipidemia Daughter    Heart disease Brother    Heart disease Brother    Heart disease Brother    Heart disease Brother    Cancer Sister    Melanoma Sister    Alzheimer's disease Sister    Heart disease Sister    Osteoporosis Sister     Medications: Patient's Medications  New  Prescriptions   No medications on file  Previous Medications   ACETAMINOPHEN (TYLENOL) 650 MG CR TABLET    Take 650 mg by mouth 2 (two) times daily.    CARBOXYMETHYLCELLULOSE (REFRESH PLUS) 0.5 % SOLN    Place 1 drop into both eyes 3 (three) times daily as needed. Uses more frequently in left eye as needed.   CHOLECALCIFEROL (VITAMIN D) 1000 UNITS TABLET    Take 1,000 Units by mouth 2 (two) times daily.   GABAPENTIN (NEURONTIN) 300 MG CAPSULE    TAKE 1 CAPSULE EVERY DAY   KRILL OIL 350 MG CAPS    Take 1 capsule by mouth daily.  LOSARTAN (COZAAR) 50 MG TABLET    TAKE 1 TABLET EVERY DAY   METOPROLOL TARTRATE (LOPRESSOR) 25 MG TABLET    TAKE 1 TABLET EVERY DAY   OXYBUTYNIN (DITROPAN) 5 MG TABLET    TAKE 1 TABLET TWICE DAILY (SUBSTITUTED FOR DITROPAN)   PANTOPRAZOLE (PROTONIX) 40 MG TABLET    TAKE 1 TABLET EVERY DAY   VENLAFAXINE XR (EFFEXOR-XR) 37.5 MG 24 HR CAPSULE    TAKE 1 CAPSULE EVERY DAY WITH BREAKFAST  Modified Medications   No medications on file  Discontinued Medications   No medications on file    Physical Exam:  Vitals:   08/01/21 1404  BP: 140/72  Pulse: 68  Temp: (!) 97.3 F (36.3 C)  TempSrc: Temporal  SpO2: 97%  Weight: 112 lb (50.8 kg)  Height: 5\' 3"  (1.6 m)   Body mass index is 19.84 kg/m. Wt Readings from Last 3 Encounters:  08/01/21 112 lb (50.8 kg)  05/09/21 112 lb 8 oz (51 kg)  01/15/21 114 lb 3.2 oz (51.8 kg)    Physical Exam Constitutional:      Appearance: Normal appearance.  HENT:     Head: Normocephalic and atraumatic.  Cardiovascular:     Rate and Rhythm: Normal rate and regular rhythm.  Pulmonary:     Effort: Pulmonary effort is normal.     Breath sounds: Normal breath sounds.  Abdominal:     General: Abdomen is flat. Bowel sounds are normal.  Musculoskeletal:        General: Swelling and tenderness present.     Cervical back: Normal range of motion and neck supple.     Comments: Bilateral ankles   Skin:    General: Skin is warm.   Neurological:     Mental Status: She is alert and oriented to person, place, and time. Mental status is at baseline.     Gait: Gait abnormal.    Labs reviewed: Basic Metabolic Panel: Recent Labs    05/09/21 1251  NA 143  K 4.3  CL 106  CO2 28  GLUCOSE 110*  BUN 30*  CREATININE 0.78  CALCIUM 10.1   Liver Function Tests: Recent Labs    05/09/21 1251  AST 19  ALT 10  ALKPHOS 81  BILITOT 0.6  PROT 6.7  ALBUMIN 3.9   No results for input(s): LIPASE, AMYLASE in the last 8760 hours. No results for input(s): AMMONIA in the last 8760 hours. CBC: Recent Labs    09/13/20 1215 01/10/21 1146 05/09/21 1251  WBC 7.2 6.0 6.0  NEUTROABS 4.4 3.4 3.4  HGB 12.4 12.2 12.1  HCT 37.9 36.2 36.0  MCV 99.0 97.6 99.4  PLT 223 213 208   Lipid Panel: No results for input(s): CHOL, HDL, LDLCALC, TRIG, CHOLHDL, LDLDIRECT in the last 8760 hours. TSH: No results for input(s): TSH in the last 8760 hours. A1C: No results found for: HGBA1C   Assessment/Plan 1. Essential hypertension BP controlled 140/72. Continues metoprolol 25 mg and Losartan 50 mg tablet daily  2. Hereditary hemochromatosis ([place]) Followed by hematology. Continues phlebotomy as needed.    3. Anxiety and depression Feels mood has improved on effexor  4. Overactive bladder Ongoing but stable, Continues oxybutynin 5 mg daily.   5. Neuropathy Ongoing and stable, Continues gabapentin 300 mg daily.   6. Gastroesophageal reflux disease without esophagitis Controlled on protonix 40 mg daily   7. Osteoarthritis of multiple joints, unspecified osteoarthritis type Ongoing. Continues tylenol BID.   Next appt: 6 months.   I  personally was present during the history, physical exam and medical decision-making activities of this service and have verified that the service and findings are accurately documented in the student's note  Kimbra Marcelino K. [place], [facility] [phone]

## 2021-08-12 DIAGNOSIS — H35033 Hypertensive retinopathy, bilateral: Secondary | ICD-10-CM | POA: Diagnosis not present

## 2021-08-12 DIAGNOSIS — H3563 Retinal hemorrhage, bilateral: Secondary | ICD-10-CM | POA: Diagnosis not present

## 2021-08-12 DIAGNOSIS — H353131 Nonexudative age-related macular degeneration, bilateral, early dry stage: Secondary | ICD-10-CM | POA: Diagnosis not present

## 2021-08-12 DIAGNOSIS — H40013 Open angle with borderline findings, low risk, bilateral: Secondary | ICD-10-CM | POA: Diagnosis not present

## 2021-09-12 ENCOUNTER — Inpatient Hospital Stay: Payer: Medicare HMO

## 2021-09-12 ENCOUNTER — Inpatient Hospital Stay: Payer: Medicare HMO | Attending: Hematology

## 2021-09-12 ENCOUNTER — Other Ambulatory Visit: Payer: Self-pay

## 2021-09-12 LAB — IRON AND TIBC
Iron: 195 ug/dL — ABNORMAL HIGH (ref 41–142)
Saturation Ratios: 87 % — ABNORMAL HIGH (ref 21–57)
TIBC: 225 ug/dL — ABNORMAL LOW (ref 236–444)
UIBC: 30 ug/dL — ABNORMAL LOW (ref 120–384)

## 2021-09-12 LAB — CBC WITH DIFFERENTIAL/PLATELET
Abs Immature Granulocytes: 0.02 10*3/uL (ref 0.00–0.07)
Basophils Absolute: 0 10*3/uL (ref 0.0–0.1)
Basophils Relative: 0 %
Eosinophils Absolute: 0.1 10*3/uL (ref 0.0–0.5)
Eosinophils Relative: 1 %
HCT: 36.6 % (ref 36.0–46.0)
Hemoglobin: 12.3 g/dL (ref 12.0–15.0)
Immature Granulocytes: 0 %
Lymphocytes Relative: 22 %
Lymphs Abs: 1.7 10*3/uL (ref 0.7–4.0)
MCH: 33.6 pg (ref 26.0–34.0)
MCHC: 33.6 g/dL (ref 30.0–36.0)
MCV: 100 fL (ref 80.0–100.0)
Monocytes Absolute: 0.6 10*3/uL (ref 0.1–1.0)
Monocytes Relative: 8 %
Neutro Abs: 5.3 10*3/uL (ref 1.7–7.7)
Neutrophils Relative %: 69 %
Platelets: 235 10*3/uL (ref 150–400)
RBC: 3.66 MIL/uL — ABNORMAL LOW (ref 3.87–5.11)
RDW: 11.9 % (ref 11.5–15.5)
WBC: 7.8 10*3/uL (ref 4.0–10.5)
nRBC: 0 % (ref 0.0–0.2)

## 2021-09-12 LAB — FERRITIN: Ferritin: 60 ng/mL (ref 11–307)

## 2021-09-12 MED ORDER — SODIUM CHLORIDE 0.9 % IV SOLN: Freq: Once | INTRAVENOUS | Status: AC

## 2021-09-12 NOTE — Patient Instructions (Signed)
Therapeutic Phlebotomy °Therapeutic phlebotomy is the planned removal of blood from a person's body for the purpose of treating a medical condition. The procedure is lot like donating blood. Usually, about a pint (470 mL, or 0.47 L) of blood is removed. The average adult has 9-12 pints (4.3-5.7 L) of blood in his or her body. °Therapeutic phlebotomy may be used to treat the following medical conditions: °Hemochromatosis. This is a condition in which the blood contains too much iron. °Polycythemia vera. This is a condition in which the blood contains too many red blood cells. °Porphyria cutanea tarda. This is a disease in which an important part of hemoglobin is not made properly. It results in the buildup of abnormal amounts of porphyrins in the body. °Sickle cell disease. This is a condition in which the red blood cells form an abnormal crescent shape rather than a round shape. °Tell a health care provider about: °Any allergies you have. °All medicines you are taking, including vitamins, herbs, eye drops, creams, and over-the-counter medicines. °Any bleeding problems you have. °Any surgeries you have had. °Any medical conditions you have. °Whether you are pregnant or may be pregnant. °What are the risks? °Generally, this is a safe procedure. However, problems may occur, including: °Nausea or light-headedness. °Low blood pressure (hypotension). °Soreness, bleeding, swelling, or bruising at the needle insertion site. °Infection. °What happens before the procedure? °Ask your health care provider about: °Changing or stopping your regular medicines. This is especially important if you are taking diabetes medicines or blood thinners. °Taking medicines such as aspirin and ibuprofen. These medicines can thin your blood. Do not take these medicines unless your health care provider tells you to take them. °Taking over-the-counter medicines, vitamins, herbs, and supplements. °Wear clothing with sleeves that can be raised  above the elbow. °You may have a blood sample taken. °Your blood pressure, pulse rate, and breathing rate will be measured. °What happens during the procedure? ° °You may be given a medicine to numb the area (local anesthetic). °A tourniquet will be placed on your arm. °A needle will be put into one of your veins. °Tubing and a collection bag will be attached to the needle. °Blood will flow through the needle and tubing into the collection bag. °The collection bag will be placed lower than your arm so gravity can help the blood flow into the bag. °You may be asked to open and close your hand slowly and continually during the entire collection. °After the specified amount of blood has been removed from your body, the collection bag and tubing will be clamped. °The needle will be removed from your vein. °Pressure will be held on the needle site to stop the bleeding. °A bandage (dressing) will be placed over the needle insertion site. °The procedure may vary among health care providers and hospitals. °What happens after the procedure? °Your blood pressure, pulse rate, and breathing rate will be measured after the procedure. °You will be encouraged to drink fluids. °You will be encouraged to eat a snack to prevent a low blood sugar level. °Your recovery will be assessed and monitored. °Return to your normal activities as told by your health care provider. °Summary °Therapeutic phlebotomy is the planned removal of blood from a person's body for the purpose of treating a medical condition. °Therapeutic phlebotomy may be used to treat hemochromatosis, polycythemia vera, porphyria cutanea tarda, or sickle cell disease. °In the procedure, a needle is inserted and about a pint (470 mL, or 0.47 L) of blood is   removed. The average adult has 9-12 pints (4.3-5.7 L) of blood in the body. °This is generally a safe procedure, but it can sometimes cause problems such as nausea, light-headedness, or low blood pressure  (hypotension). °This information is not intended to replace advice given to you by your health care provider. Make sure you discuss any questions you have with your health care provider. °Document Revised: 03/26/2021 Document Reviewed: 03/26/2021 °Elsevier Patient Education © 2022 [employer] ° °

## 2021-09-12 NOTE — Progress Notes (Signed)
Patient tolerated phlebotomy and IVF were administered post phlebotomy. Discharged in stable condition.

## 2021-09-13 LAB — AFP TUMOR MARKER: AFP, Serum, Tumor Marker: <1.8 ng/mL (ref 0.0–8.7)

## 2021-09-18 ENCOUNTER — Other Ambulatory Visit: Payer: Self-pay | Admitting: Nurse Practitioner

## 2021-09-18 DIAGNOSIS — F419 Anxiety disorder, unspecified: Secondary | ICD-10-CM

## 2021-10-26 ENCOUNTER — Other Ambulatory Visit: Payer: Self-pay | Admitting: Nurse Practitioner

## 2021-10-26 DIAGNOSIS — I10 Essential (primary) hypertension: Secondary | ICD-10-CM

## 2021-10-30 ENCOUNTER — Encounter: Payer: Self-pay | Admitting: Nurse Practitioner

## 2021-12-16 ENCOUNTER — Other Ambulatory Visit: Payer: Self-pay | Admitting: Nurse Practitioner

## 2022-01-08 ENCOUNTER — Other Ambulatory Visit: Payer: Self-pay

## 2022-01-08 ENCOUNTER — Encounter: Payer: Self-pay | Admitting: Nurse Practitioner

## 2022-01-09 ENCOUNTER — Inpatient Hospital Stay: Payer: Medicare HMO

## 2022-01-09 ENCOUNTER — Inpatient Hospital Stay: Payer: Medicare HMO | Attending: Hematology

## 2022-01-09 ENCOUNTER — Other Ambulatory Visit: Payer: Self-pay

## 2022-01-09 LAB — CBC WITH DIFFERENTIAL (CANCER CENTER ONLY)
Abs Immature Granulocytes: 0.01 10*3/uL (ref 0.00–0.07)
Basophils Absolute: 0 10*3/uL (ref 0.0–0.1)
Basophils Relative: 0 %
Eosinophils Absolute: 0.2 10*3/uL (ref 0.0–0.5)
Eosinophils Relative: 3 %
HCT: 36.7 % (ref 36.0–46.0)
Hemoglobin: 12.1 g/dL (ref 12.0–15.0)
Immature Granulocytes: 0 %
Lymphocytes Relative: 26 %
Lymphs Abs: 1.7 10*3/uL (ref 0.7–4.0)
MCH: 32.3 pg (ref 26.0–34.0)
MCHC: 33 g/dL (ref 30.0–36.0)
MCV: 97.9 fL (ref 80.0–100.0)
Monocytes Absolute: 0.6 10*3/uL (ref 0.1–1.0)
Monocytes Relative: 9 %
Neutro Abs: 4 10*3/uL (ref 1.7–7.7)
Neutrophils Relative %: 62 %
Platelet Count: 259 10*3/uL (ref 150–400)
RBC: 3.75 MIL/uL — ABNORMAL LOW (ref 3.87–5.11)
RDW: 11.9 % (ref 11.5–15.5)
WBC Count: 6.6 10*3/uL (ref 4.0–10.5)
nRBC: 0 % (ref 0.0–0.2)

## 2022-01-09 LAB — IRON AND IRON BINDING CAPACITY (CC-WL,HP ONLY)
Iron: 208 ug/dL — ABNORMAL HIGH (ref 28–170)
Saturation Ratios: 76 % — ABNORMAL HIGH (ref 10.4–31.8)
TIBC: 274 ug/dL (ref 250–450)
UIBC: 66 ug/dL — ABNORMAL LOW (ref 148–442)

## 2022-01-09 LAB — CMP (CANCER CENTER ONLY)
ALT: 10 U/L (ref 0–44)
AST: 17 U/L (ref 15–41)
Albumin: 4.1 g/dL (ref 3.5–5.0)
Alkaline Phosphatase: 100 U/L (ref 38–126)
Anion gap: 6 (ref 5–15)
BUN: 29 mg/dL — ABNORMAL HIGH (ref 8–23)
CO2: 32 mmol/L (ref 22–32)
Calcium: 9.7 mg/dL (ref 8.9–10.3)
Chloride: 102 mmol/L (ref 98–111)
Creatinine: 0.69 mg/dL (ref 0.44–1.00)
GFR, Estimated: >60 mL/min (ref >60)
Glucose, Bld: 106 mg/dL — ABNORMAL HIGH (ref 70–99)
Potassium: 4.1 mmol/L (ref 3.5–5.1)
Sodium: 140 mmol/L (ref 135–145)
Total Bilirubin: 0.7 mg/dL (ref 0.3–1.2)
Total Protein: 7 g/dL (ref 6.5–8.1)

## 2022-01-09 LAB — FERRITIN: Ferritin: 62 ng/mL (ref 11–307)

## 2022-01-09 NOTE — Progress Notes (Signed)
Contacted Dr. Burr Medico regarding patients labs and need for phlebotomy today. Per Dr. Burr Medico patient does not need a phlebotomy today. Patient and family member made aware and had no questions or concerns. ?

## 2022-01-23 ENCOUNTER — Encounter: Payer: Self-pay | Admitting: Nurse Practitioner

## 2022-01-23 ENCOUNTER — Ambulatory Visit (INDEPENDENT_AMBULATORY_CARE_PROVIDER_SITE_OTHER): Payer: Medicare HMO | Admitting: Nurse Practitioner

## 2022-01-23 VITALS — BP 136/78 | HR 66 | Temp 97.3°F | Ht 63.0 in | Wt 111.0 lb

## 2022-01-23 DIAGNOSIS — F419 Anxiety disorder, unspecified: Secondary | ICD-10-CM | POA: Diagnosis not present

## 2022-01-23 DIAGNOSIS — L602 Onychogryphosis: Secondary | ICD-10-CM

## 2022-01-23 DIAGNOSIS — M159 Polyosteoarthritis, unspecified: Secondary | ICD-10-CM

## 2022-01-23 DIAGNOSIS — N3281 Overactive bladder: Secondary | ICD-10-CM | POA: Diagnosis not present

## 2022-01-23 DIAGNOSIS — I10 Essential (primary) hypertension: Secondary | ICD-10-CM

## 2022-01-23 DIAGNOSIS — R251 Tremor, unspecified: Secondary | ICD-10-CM | POA: Diagnosis not present

## 2022-01-23 DIAGNOSIS — G629 Polyneuropathy, unspecified: Secondary | ICD-10-CM

## 2022-01-23 DIAGNOSIS — K219 Gastro-esophageal reflux disease without esophagitis: Secondary | ICD-10-CM

## 2022-01-23 DIAGNOSIS — F32A Depression, unspecified: Secondary | ICD-10-CM

## 2022-01-23 NOTE — Progress Notes (Signed)
? ? ?Careteam: ?Patient Care Team: ?Lauree Chandler, NP as PCP - General (Geriatric Medicine) ? ?PLACE OF SERVICE:  ?[facility]  ?Advanced Directive information ?Does Patient Have a Medical Advance Directive?: Yes, Type of Advance Directive: [place];Living will, Does patient want to make changes to medical advance directive?: No - Patient declined ? ?Allergies  ?Allergen Reactions  ? Codeine Nausea And Vomiting  ? Dilaudid [Hydromorphone Hcl] Hives and Other (See Comments)  ?  "whelps" (10/18/2012)  ? Macrodantin Hives and Other (See Comments)  ?  "drug fever; chills; felt terrible" (10/18/2012)  ? Morphine And Related Hives and Nausea And Vomiting  ? Sulfa Antibiotics Hives, Rash and Other (See Comments)  ?  "got real sick" (10/18/2012)  ? Ibuprofen Other (See Comments)  ?  "felt like I have to go to the bathroom constantly"  ? ? ?Chief Complaint  ?Patient presents with  ? Medical Management of Chronic Issues  ?  6 month follow-up. 3 toes are extremely sore (right foot)  ? ? ? ?HPI: Patient is a 88 y.o. female for routine follow up.  ? ?Continues to see hematology for hemochromatosis. Did not require phlebotomy at last visit but expects she will need it next time ? ?Continues to be very active but pain is a huge concern for her. Low back pain with sciatica.  ? ?Daughter notices her handwriting has become really small. Slow shuffling gait. Fine tremor and hard for her to get food to mouth at times.  ? ?Overactive bladder- uses pads and continues on oxybuytin ? ?Neuropathy- ongoing on gabapentin.  ? ?Review of Systems:  ?Review of Systems  ?Constitutional:  Negative for chills, fever and weight loss.  ?HENT:  Negative for tinnitus.   ?Respiratory:  Negative for cough, sputum production and shortness of breath.   ?Cardiovascular:  Negative for chest pain, palpitations and leg swelling.  ?Gastrointestinal:  Negative for abdominal pain, constipation, diarrhea and heartburn.  ?Genitourinary:  Negative  for dysuria, frequency and urgency.  ?Musculoskeletal:  Negative for back pain, falls, joint pain and myalgias.  ?Skin: Negative.   ?Neurological:  Negative for dizziness and headaches.  ?Psychiatric/Behavioral:  Negative for depression and memory loss. The patient does not have insomnia.   ? ?Past Medical History:  ?Diagnosis Date  ? Basal cell cancer   ? "face, legs" (10/18/2012)  ? Bruises easily   ? Chronic lower back pain   ? Depression   ? takes Effexor daily  ? Diverticulosis of sigmoid colon 11-20-2009  ? Dry eyes   ? uses Refresh eye drops daily as needed  ? Dysphagia   ? Gastric ulcer   ? GERD (gastroesophageal reflux disease)   ? takes Omeprazole daily  ? H/O hiatal hernia   ? Hemochromatosis 1987  ? Hemochromatosis   ? Hemorrhoids, internal 11-20-09  ? History of blood transfusion   ? "S/P colonoscopy after polyp removed; got 2 units; esophageal bleed got 1 unit; really messed up hemochromatosis" (1/7/20214)  ? History of bronchitis 1991-1996  ? "chronic; related to winter" (10/18/2012)  ? History of colonic polyps   ? NOTED 11-20-09 IN COLONOSCOPY REPORT  ? Hyperlipidemia   ? no meds required  ? Hypertension   ? takes Metoprolol and Losartan daily  ? Joint pain   ? Joint swelling   ? Muscle pain   ? Muscle spasm   ? takes Robaxin daily as needed  ? Occasional tremors   ? Osteoarthritis   ? Osteoarthritis of  right hip 10/18/2012  ? Osteoporosis   ? Overactive bladder   ? takes Myrbetriq daily  ? PONV (postoperative nausea and vomiting)   ? Rheumatic fever   ? hx of  ? Squamous carcinoma   ? "LLE" (10/18/2012)  ? Stenosis of esophagus   ? Tremor, essential 10-29-09  ? Urinary urgency   ? takes Ditropan daily  ? Vomiting   ? occasionally  ? ?Past Surgical History:  ?Procedure Laterality Date  ? APPENDECTOMY  1952  ? CATARACT EXTRACTION W/ INTRAOCULAR LENS  IMPLANT, BILATERAL  2001  ? "both eyes" (10/18/2012)  ? CHOLECYSTECTOMY  1977  ? COLONOSCOPY    ? [place] OF UTERUS  1975  ? ESOPHAGOGASTRODUODENOSCOPY     ? [place] SURGERY  1989  ? SHOULDER ARTHROSCOPY W/ ROTATOR CUFF REPAIR  1998?  ? "left" (10/18/2012)  ? SKIN CANCER EXCISION    ? "multiple" (10/18/2012)  ? TONSILLECTOMY  1953  ? TOTAL HIP ARTHROPLASTY  10/18/2012  ? "right" (10/18/2012)  ? TOTAL HIP ARTHROPLASTY  10/18/2012  ? Procedure: TOTAL HIP ARTHROPLASTY;  Surgeon: Johnny Bridge, MD;  Location: [facility];  Service: Orthopedics;  Laterality: Right;  ? TOTAL HIP ARTHROPLASTY Left 07/24/2014  ? dr Mardelle Matte  ? TOTAL HIP ARTHROPLASTY Left 07/24/2014  ? Procedure: LEFT TOTAL HIP ARTHROPLASTY;  Surgeon: Johnny Bridge, MD;  Location: [facility];  Service: Orthopedics;  Laterality: Left;  ? TUBAL LIGATION  1975  ? ?Social History: ?  reports that she has never smoked. She has never used smokeless tobacco. She reports that she does not drink alcohol and does not use drugs. ? ?Family History  ?Problem Relation Age of Onset  ? Heart disease Mother   ? Heart disease Father   ? Hyperlipidemia Daughter   ? Heart disease Brother   ? Heart disease Brother   ? Heart disease Brother   ? Heart disease Brother   ? Cancer Sister   ? Melanoma Sister   ? Alzheimer's disease Sister   ? Heart disease Sister   ? Osteoporosis Sister   ? ? ?Medications: ?Patient's Medications  ?New Prescriptions  ? No medications on file  ?Previous Medications  ? ACETAMINOPHEN (TYLENOL) 650 MG CR TABLET    Take 650 mg by mouth 2 (two) times daily.   ? CARBOXYMETHYLCELLULOSE (REFRESH PLUS) 0.5 % SOLN    Place 1 drop into both eyes 3 (three) times daily as needed. Uses more frequently in left eye as needed.  ? CHOLECALCIFEROL (VITAMIN D) 1000 UNITS TABLET    Take 1,000 Units by mouth 2 (two) times daily.  ? GABAPENTIN (NEURONTIN) 300 MG CAPSULE    TAKE 1 CAPSULE EVERY DAY  ? KRILL OIL 350 MG CAPS    Take 1 capsule by mouth daily.  ? LOSARTAN (COZAAR) 50 MG TABLET    TAKE 1 TABLET EVERY DAY  ? METOPROLOL TARTRATE (LOPRESSOR) 25 MG TABLET    TAKE 1 TABLET EVERY DAY  ? OXYBUTYNIN (DITROPAN) 5 MG TABLET    TAKE 1 TABLET  TWICE DAILY (SUBSTITUTED FOR DITROPAN)  ? PANTOPRAZOLE (PROTONIX) 40 MG TABLET    TAKE 1 TABLET EVERY DAY  ? VENLAFAXINE XR (EFFEXOR-XR) 37.5 MG 24 HR CAPSULE    TAKE 1 CAPSULE EVERY DAY WITH BREAKFAST  ?Modified Medications  ? No medications on file  ?Discontinued Medications  ? No medications on file  ? ? ?Physical Exam: ? ?Vitals:  ? 01/23/22 1412  ?BP: 136/78  ?Pulse: 66  ?  Temp: (!) 97.3 ?F (36.3 ?C)  ?TempSrc: Temporal  ?SpO2: 97%  ?Weight: 111 lb (50.3 kg)  ?Height: '5\' 3"'$  (1.6 m)  ? ?Body mass index is 19.66 kg/m?. ?Wt Readings from Last 3 Encounters:  ?01/23/22 111 lb (50.3 kg)  ?08/01/21 112 lb (50.8 kg)  ?05/09/21 112 lb 8 oz (51 kg)  ? ? ?Physical Exam ?Constitutional:   ?   General: She is not in acute distress. ?   Appearance: She is well-developed. She is not diaphoretic.  ?HENT:  ?   Head: Normocephalic and atraumatic.  ?   Mouth/Throat:  ?   Pharynx: No oropharyngeal exudate.  ?Eyes:  ?   Conjunctiva/sclera: Conjunctivae normal.  ?   Pupils: Pupils are equal, round, and reactive to light.  ?Cardiovascular:  ?   Rate and Rhythm: Normal rate and regular rhythm.  ?   Heart sounds: Normal heart sounds.  ?Pulmonary:  ?   Effort: Pulmonary effort is normal.  ?   Breath sounds: Normal breath sounds.  ?Abdominal:  ?   General: Bowel sounds are normal.  ?   Palpations: Abdomen is soft.  ?Musculoskeletal:     ?   General: No tenderness.  ?   Cervical back: Normal range of motion and neck supple.  ?Skin: ?   General: Skin is warm and dry.  ?Neurological:  ?   Mental Status: She is alert and oriented to person, place, and time.  ?   Motor: Tremor (fine) present.  ?   Gait: Gait abnormal (shuffling gait).  ? ? ?Labs reviewed: ?Basic Metabolic Panel: ?Recent Labs  ?  05/09/21 ?1251 01/09/22 ?1150  ?NA 143 140  ?K 4.3 4.1  ?CL 106 102  ?CO2 28 32  ?GLUCOSE 110* 106*  ?BUN 30* 29*  ?CREATININE 0.78 0.69  ?CALCIUM 10.1 9.7  ? ?Liver Function Tests: ?Recent Labs  ?  05/09/21 ?1251 01/09/22 ?1150  ?AST 19 17  ?ALT 10 10   ?ALKPHOS 81 100  ?BILITOT 0.6 0.7  ?PROT 6.7 7.0  ?ALBUMIN 3.9 4.1  ? ?No results for input(s): LIPASE, AMYLASE in the last 8760 hours. ?No results for input(s): AMMONIA in the last 8760 hours. ?CBC

## 2022-02-02 ENCOUNTER — Other Ambulatory Visit: Payer: Self-pay | Admitting: Nurse Practitioner

## 2022-02-02 DIAGNOSIS — K219 Gastro-esophageal reflux disease without esophagitis: Secondary | ICD-10-CM

## 2022-02-17 DIAGNOSIS — H524 Presbyopia: Secondary | ICD-10-CM | POA: Diagnosis not present

## 2022-02-17 DIAGNOSIS — H04123 Dry eye syndrome of bilateral lacrimal glands: Secondary | ICD-10-CM | POA: Diagnosis not present

## 2022-02-17 DIAGNOSIS — H40051 Ocular hypertension, right eye: Secondary | ICD-10-CM | POA: Diagnosis not present

## 2022-02-17 DIAGNOSIS — H40013 Open angle with borderline findings, low risk, bilateral: Secondary | ICD-10-CM | POA: Diagnosis not present

## 2022-02-17 DIAGNOSIS — H3563 Retinal hemorrhage, bilateral: Secondary | ICD-10-CM | POA: Diagnosis not present

## 2022-04-28 ENCOUNTER — Other Ambulatory Visit: Payer: Self-pay | Admitting: Nurse Practitioner

## 2022-04-28 DIAGNOSIS — I10 Essential (primary) hypertension: Secondary | ICD-10-CM

## 2022-05-05 ENCOUNTER — Other Ambulatory Visit: Payer: Self-pay | Admitting: Nurse Practitioner

## 2022-05-05 DIAGNOSIS — F419 Anxiety disorder, unspecified: Secondary | ICD-10-CM

## 2022-05-05 DIAGNOSIS — I10 Essential (primary) hypertension: Secondary | ICD-10-CM

## 2022-05-08 ENCOUNTER — Other Ambulatory Visit: Payer: Self-pay

## 2022-05-08 ENCOUNTER — Inpatient Hospital Stay: Payer: Medicare HMO

## 2022-05-08 ENCOUNTER — Inpatient Hospital Stay: Payer: Medicare HMO | Admitting: Hematology

## 2022-05-08 ENCOUNTER — Encounter: Payer: Self-pay | Admitting: Hematology

## 2022-05-08 ENCOUNTER — Inpatient Hospital Stay: Payer: Medicare HMO | Attending: Hematology

## 2022-05-08 DIAGNOSIS — M810 Age-related osteoporosis without current pathological fracture: Secondary | ICD-10-CM | POA: Insufficient documentation

## 2022-05-08 DIAGNOSIS — M545 Low back pain, unspecified: Secondary | ICD-10-CM | POA: Insufficient documentation

## 2022-05-08 DIAGNOSIS — G629 Polyneuropathy, unspecified: Secondary | ICD-10-CM | POA: Diagnosis not present

## 2022-05-08 DIAGNOSIS — I10 Essential (primary) hypertension: Secondary | ICD-10-CM | POA: Diagnosis not present

## 2022-05-08 DIAGNOSIS — M2550 Pain in unspecified joint: Secondary | ICD-10-CM | POA: Insufficient documentation

## 2022-05-08 LAB — CMP (CANCER CENTER ONLY)
ALT: 10 U/L (ref 0–44)
AST: 17 U/L (ref 15–41)
Albumin: 4.2 g/dL (ref 3.5–5.0)
Alkaline Phosphatase: 77 U/L (ref 38–126)
Anion gap: 5 (ref 5–15)
BUN: 28 mg/dL — ABNORMAL HIGH (ref 8–23)
CO2: 32 mmol/L (ref 22–32)
Calcium: 9.6 mg/dL (ref 8.9–10.3)
Chloride: 104 mmol/L (ref 98–111)
Creatinine: 0.72 mg/dL (ref 0.44–1.00)
GFR, Estimated: >60 mL/min (ref >60)
Glucose, Bld: 115 mg/dL — ABNORMAL HIGH (ref 70–99)
Potassium: 3.9 mmol/L (ref 3.5–5.1)
Sodium: 141 mmol/L (ref 135–145)
Total Bilirubin: 0.8 mg/dL (ref 0.3–1.2)
Total Protein: 7 g/dL (ref 6.5–8.1)

## 2022-05-08 LAB — FERRITIN: Ferritin: 43 ng/mL (ref 11–307)

## 2022-05-08 LAB — CBC WITH DIFFERENTIAL (CANCER CENTER ONLY)
Abs Immature Granulocytes: 0.01 10*3/uL (ref 0.00–0.07)
Basophils Absolute: 0 10*3/uL (ref 0.0–0.1)
Basophils Relative: 0 %
Eosinophils Absolute: 0.1 10*3/uL (ref 0.0–0.5)
Eosinophils Relative: 2 %
HCT: 36.6 % (ref 36.0–46.0)
Hemoglobin: 12.3 g/dL (ref 12.0–15.0)
Immature Granulocytes: 0 %
Lymphocytes Relative: 25 %
Lymphs Abs: 1.6 10*3/uL (ref 0.7–4.0)
MCH: 33.3 pg (ref 26.0–34.0)
MCHC: 33.6 g/dL (ref 30.0–36.0)
MCV: 99.2 fL (ref 80.0–100.0)
Monocytes Absolute: 0.7 10*3/uL (ref 0.1–1.0)
Monocytes Relative: 10 %
Neutro Abs: 3.9 10*3/uL (ref 1.7–7.7)
Neutrophils Relative %: 63 %
Platelet Count: 232 10*3/uL (ref 150–400)
RBC: 3.69 MIL/uL — ABNORMAL LOW (ref 3.87–5.11)
RDW: 11.9 % (ref 11.5–15.5)
WBC Count: 6.3 10*3/uL (ref 4.0–10.5)
nRBC: 0 % (ref 0.0–0.2)

## 2022-05-08 LAB — IRON AND IRON BINDING CAPACITY (CC-WL,HP ONLY)
Iron: 245 ug/dL — ABNORMAL HIGH (ref 28–170)
Saturation Ratios: 93 % — ABNORMAL HIGH (ref 10.4–31.8)
TIBC: 263 ug/dL (ref 250–450)
UIBC: 18 ug/dL — ABNORMAL LOW (ref 148–442)

## 2022-05-08 MED ORDER — SODIUM CHLORIDE 0.9 % IV SOLN: Freq: Once | INTRAVENOUS | Status: AC

## 2022-05-08 NOTE — Progress Notes (Signed)
[place]   [phone] [phone]   Clinic Follow up Note   Patient Care Team: Lauree Chandler, NP as PCP - General (Geriatric Medicine)  Date of Service:  05/08/2022  CHIEF COMPLAINT: f/u of hereditary hemachromatosis  CURRENT THERAPY:  Phlebotomy as needed, for ferritin </= 50 and transferrin sat <50%  ASSESSMENT & PLAN:  Denise Lane is a 88 y.o. female with   1. Hemachromatosis:  -She is currently being treated with as needed phlebotomies every 4 months. She is tolerating well. Due to her advanced age, we are use loose criteria for her phlebotomy.  However she does feel better (less joint pain) after phlebotomy, and she has been tolerating phlebotomy well.  So plan to reduce her phlebotomy criteria. -If her ferritin >50 or transferrin saturation>50%, I will recommend phlebotomy, I'll do 0.75 unit with normal saline 500 mL infusion. Her iron study result is usually not available during her visit, and OK to do Phlebotomy based on her last lab results.  -we again discussed her severe arthralgia and hemachromatosis. Her daughter reports she seems to feel better after phlebotomy. I explained they could be related given iron deposits in joints.  -We will continue to do labs every 4 months, with phlebotomy as needed. -F/u in 12 months    2. Osteoporosis -Followed by Dr. Dewaine Oats    3. Neuropathy in feet, low back pain  -She ambulates with cane.  -I encouraged her to watch for fall and to continue walking and being active at home.  -For pain she has only been on Tylenol due to concern from prior GI bleed. -She will continue to f/u with orthopedist Dr Lurline Idol   4.  Severe arthralgia -I encouraged her to follow-up with orthopedics     PLAN  -will proceed with phlebotomy today -lab and phlebotomy every 4 months X3 -F/u in 12 months   No problem-specific Assessment & Plan notes found for this encounter.   INTERVAL HISTORY:   Denise Lane is here for a follow up of hemachromatosis. She was last seen by me a year ago. She presents to the clinic accompanied by her daughter. She reports she is doing through a lot of difficulties due to arthritis and Parkinson's. Her daughter explains she was recently diagnosed with Parkinson's.   All other systems were reviewed with the patient and are negative.  MEDICAL HISTORY:  Past Medical History:  Diagnosis Date   Basal cell cancer    "face, legs" (10/18/2012)   Bruises easily    Chronic lower back pain    Depression    takes Effexor daily   Diverticulosis of sigmoid colon 11-20-2009   Dry eyes    uses Refresh eye drops daily as needed   Dysphagia    Gastric ulcer    GERD (gastroesophageal reflux disease)    takes Omeprazole daily   H/O hiatal hernia    Hemochromatosis 1987   Hemochromatosis    Hemorrhoids, internal 11-20-09   History of blood transfusion    "S/P colonoscopy after polyp removed; got 2 units; esophageal bleed got 1 unit; really messed up hemochromatosis" (1/7/20214)   History of bronchitis 1991-1996   "chronic; related to winter" (10/18/2012)   History of colonic polyps    NOTED 11-20-09 IN COLONOSCOPY REPORT   Hyperlipidemia    no meds required   Hypertension    takes Metoprolol and Losartan daily   Joint pain    Joint swelling    Muscle pain  Muscle spasm    takes Robaxin daily as needed   Occasional tremors    Osteoarthritis    Osteoarthritis of right hip 10/18/2012   Osteoporosis    Overactive bladder    takes Myrbetriq daily   PONV (postoperative nausea and vomiting)    Rheumatic fever    hx of   Squamous carcinoma    "LLE" (10/18/2012)   Stenosis of esophagus    Tremor, essential 10-29-09   Urinary urgency    takes Ditropan daily   Vomiting    occasionally    SURGICAL HISTORY: Past Surgical History:  Procedure Laterality Date   APPENDECTOMY  1952   CATARACT EXTRACTION W/ INTRAOCULAR LENS  IMPLANT, BILATERAL  2001   "both  eyes" (10/18/2012)   [place]?   "left" (10/18/2012)   SKIN CANCER EXCISION     "multiple" (10/18/2012)   [place] ARTHROPLASTY  10/18/2012   "right" (10/18/2012)   TOTAL HIP ARTHROPLASTY  10/18/2012   Procedure: TOTAL HIP ARTHROPLASTY;  Surgeon: Johnny Bridge, MD;  Location: [facility];  Service: Orthopedics;  Laterality: Right;   TOTAL HIP ARTHROPLASTY Left 07/24/2014   dr Mardelle Matte   TOTAL HIP ARTHROPLASTY Left 07/24/2014   Procedure: LEFT TOTAL HIP ARTHROPLASTY;  Surgeon: Johnny Bridge, MD;  Location: [facility];  Service: Orthopedics;  Laterality: Left;   TUBAL LIGATION  1975    I have reviewed the social history and family history with the patient and they are unchanged from previous note.  ALLERGIES:  is allergic to codeine, dilaudid [hydromorphone hcl], macrodantin, morphine and related, sulfa antibiotics, and ibuprofen.  MEDICATIONS:  Current Outpatient Medications  Medication Sig Dispense Refill   acetaminophen (TYLENOL) 650 MG CR tablet Take 650 mg by mouth 2 (two) times daily.      carboxymethylcellulose (REFRESH PLUS) 0.5 % SOLN Place 1 drop into both eyes 3 (three) times daily as needed. Uses more frequently in left eye as needed.     cholecalciferol (VITAMIN D) 1000 UNITS tablet Take 1,000 Units by mouth 2 (two) times daily.     gabapentin (NEURONTIN) 300 MG capsule TAKE 1 CAPSULE EVERY DAY 90 capsule 1   Krill Oil 350 MG CAPS Take 1 capsule by mouth daily.     losartan (COZAAR) 50 MG tablet TAKE 1 TABLET EVERY DAY 90 tablet 1   metoprolol tartrate (LOPRESSOR) 25 MG tablet TAKE 1 TABLET EVERY DAY 90 tablet 2   oxybutynin (DITROPAN) 5 MG tablet TAKE 1 TABLET TWICE DAILY (SUBSTITUTED FOR DITROPAN) 180 tablet 1   pantoprazole (PROTONIX) 40 MG tablet TAKE 1 TABLET EVERY DAY 90 tablet 1    venlafaxine XR (EFFEXOR-XR) 37.5 MG 24 hr capsule TAKE 1 CAPSULE EVERY DAY WITH BREAKFAST 90 capsule 1   No current facility-administered medications for this visit.    PHYSICAL EXAMINATION: ECOG PERFORMANCE STATUS: 3 - Symptomatic, >50% confined to bed  Vitals:   05/08/22 1233  BP: (!) 113/51  Pulse: (!) 57  Resp: 17  Temp: 98.3 F (36.8 C)  SpO2: 98%   Wt Readings from Last 3 Encounters:  05/08/22 111 lb 1 oz (50.4 kg)  01/23/22 111 lb (50.3 kg)  08/01/21 112 lb (50.8 kg)     GENERAL:alert, no distress and comfortable SKIN:  skin color normal, no rashes or significant lesions EYES: normal, Conjunctiva are pink and non-injected, sclera clear  NEURO: alert & oriented x 3 with fluent speech  LABORATORY DATA:  I have reviewed the data as listed    Latest Ref Rng & Units 05/08/2022   12:59 PM 01/09/2022   11:50 AM 09/12/2021   11:57 AM  CBC  WBC 4.0 - 10.5 K/uL 6.3  6.6  7.8   Hemoglobin 12.0 - 15.0 g/dL 12.3  12.1  12.3   Hematocrit 36.0 - 46.0 % 36.6  36.7  36.6   Platelets 150 - 400 K/uL 232  259  235         Latest Ref Rng & Units 05/08/2022   12:59 PM 01/09/2022   11:50 AM 05/09/2021   12:51 PM  CMP  Glucose 70 - 99 mg/dL 115  106  110   BUN 8 - 23 mg/dL '28  29  30   '$ Creatinine 0.44 - 1.00 mg/dL 0.72  0.69  0.78   Sodium 135 - 145 mmol/L 141  140  143   Potassium 3.5 - 5.1 mmol/L 3.9  4.1  4.3   Chloride 98 - 111 mmol/L 104  102  106   CO2 22 - 32 mmol/L 32  32  28   Calcium 8.9 - 10.3 mg/dL 9.6  9.7  10.1   Total Protein 6.5 - 8.1 g/dL 7.0  7.0  6.7   Total Bilirubin 0.3 - 1.2 mg/dL 0.8  0.7  0.6   Alkaline Phos 38 - 126 U/L 77  100  81   AST 15 - 41 U/L '17  17  19   '$ ALT 0 - 44 U/L '10  10  10       '$ RADIOGRAPHIC STUDIES: I have personally reviewed the radiological images as listed and agreed with the findings in the report. No results found.    No orders of the defined types were placed in this encounter.  All questions were answered. The patient  knows to call the clinic with any problems, questions or concerns. No barriers to learning was detected. The total time spent in the appointment was 25 minutes.     Truitt Merle, MD 05/08/2022   I, Wilburn Mylar, am acting as scribe for Truitt Merle, MD.   I have reviewed the above documentation for accuracy and completeness, and I agree with the above.

## 2022-05-08 NOTE — Progress Notes (Signed)
Denise Lane presents today for phlebotomy per MD orders. Phlebotomy procedure started at 1430 and ended at 1440. 400 grams removed before clotting off. 18 G needle used.  Patient observed for 30 minutes after procedure without any incident and received 500 cc bolus per orders. Patient tolerated procedure well. IV needle removed intact.

## 2022-05-08 NOTE — Patient Instructions (Signed)

## 2022-05-09 LAB — AFP TUMOR MARKER: AFP, Serum, Tumor Marker: <1.8 ng/mL (ref 0.0–8.7)

## 2022-05-13 ENCOUNTER — Telehealth: Payer: Self-pay | Admitting: Hematology

## 2022-05-13 NOTE — Telephone Encounter (Signed)
Scheduled follow-up appointments per 7/28 los. Patient's daughter is aware.

## 2022-07-05 DIAGNOSIS — T63441A Toxic effect of venom of bees, accidental (unintentional), initial encounter: Secondary | ICD-10-CM | POA: Diagnosis not present

## 2022-07-10 ENCOUNTER — Encounter: Payer: Medicare HMO | Admitting: Nurse Practitioner

## 2022-07-15 ENCOUNTER — Telehealth: Payer: Self-pay

## 2022-07-15 DIAGNOSIS — I10 Essential (primary) hypertension: Secondary | ICD-10-CM | POA: Diagnosis not present

## 2022-07-15 DIAGNOSIS — K219 Gastro-esophageal reflux disease without esophagitis: Secondary | ICD-10-CM | POA: Diagnosis not present

## 2022-07-15 DIAGNOSIS — R110 Nausea: Secondary | ICD-10-CM | POA: Diagnosis not present

## 2022-07-15 DIAGNOSIS — G2581 Restless legs syndrome: Secondary | ICD-10-CM | POA: Diagnosis not present

## 2022-07-15 DIAGNOSIS — R1084 Generalized abdominal pain: Secondary | ICD-10-CM | POA: Diagnosis not present

## 2022-07-15 DIAGNOSIS — R1032 Left lower quadrant pain: Secondary | ICD-10-CM | POA: Diagnosis not present

## 2022-07-15 DIAGNOSIS — F419 Anxiety disorder, unspecified: Secondary | ICD-10-CM | POA: Diagnosis not present

## 2022-07-15 DIAGNOSIS — Z20822 Contact with and (suspected) exposure to covid-19: Secondary | ICD-10-CM | POA: Diagnosis not present

## 2022-07-15 NOTE — Telephone Encounter (Signed)
Incoming call received from patients daughter, patient with abdominal pain since Saturday (better since bowel movement this morning), nausea, 1 episode of vomiting (green), belching, and gas that onset today. Patient with decreased appetite and decreased fluid intake.  Patient denies fever or the presence of blood in stool.  No available appointments today due to the time of call. Patient to be seen tomorrow at 10 am by [facility].  [name] (daughter) may decided to take her mom to urgent care and plans to call to cancel appointment if that is the conclusion after she speaks with her mother.  Message sent to Lauree Chandler, NP and Dinah as a [facility].

## 2022-07-15 NOTE — Telephone Encounter (Signed)
Recommend urgent care for evaluation.

## 2022-07-16 ENCOUNTER — Ambulatory Visit: Payer: Medicare HMO | Admitting: Family

## 2022-07-16 ENCOUNTER — Encounter: Payer: Medicare HMO | Admitting: Nurse Practitioner

## 2022-07-17 NOTE — Telephone Encounter (Signed)
I noticed that patient canceled appointment. I called patients daughter to follow up.  Patient was seen at urgent care:  Flu and covid test was negative, negative for UTI, patient also had a abdominal U/S and it did not show anything. Patient was given some zofran and felt better after one dose.   Denise Lane thinks symptoms are related to nervousness due to her diagnosis of melanoma and patients sister died from melanoma. Denise Lane states once she told her mother about her (Denise Lane's) diagnosis, all these symptoms onset, however her mother will not admit that it's related.   Denise Lane thinks symptoms could possibly be related to hip pain also, however patient is refusing to see ortho at this time and states she will let [name] know if she feels that ortho needs to be involved.  I had a verbal conversation with Lauree Chandler, NP to update her on the above.

## 2022-07-27 ENCOUNTER — Ambulatory Visit (INDEPENDENT_AMBULATORY_CARE_PROVIDER_SITE_OTHER): Payer: Medicare HMO | Admitting: Nurse Practitioner

## 2022-07-27 ENCOUNTER — Encounter: Payer: Self-pay | Admitting: Nurse Practitioner

## 2022-07-27 DIAGNOSIS — Z0000 Encounter for general adult medical examination without abnormal findings: Secondary | ICD-10-CM | POA: Diagnosis not present

## 2022-07-27 NOTE — Patient Instructions (Signed)
[name] , Thank you for taking time to come for your Medicare Wellness Visit. I appreciate your ongoing commitment to your health goals. Please review the following plan we discussed and let me know if I can assist you in the future.   Screening recommendations/referrals: Colonoscopy aged out Mammogram aged out Bone Density declines Recommended yearly ophthalmology/optometry visit for glaucoma screening and checkup Recommended yearly dental visit for hygiene and checkup  Vaccinations: Influenza vaccine- due annually in September/October Pneumococcal vaccine up to date Tdap vaccine up to date Shingles vaccine up to date    Advanced directives: on file.   Conditions/risks identified: advance age, fall risk  Next appointment: yearly- next in person   Preventive Care 53 Years and Older, Female Preventive care refers to lifestyle choices and visits with your health care provider that can promote health and wellness. What does preventive care include? A yearly physical exam. This is also called an annual well check. Dental exams once or twice a year. Routine eye exams. Ask your health care provider how often you should have your eyes checked. Personal lifestyle choices, including: Daily care of your teeth and gums. Regular physical activity. Eating a healthy diet. Avoiding tobacco and drug use. Limiting alcohol use. Practicing safe sex. Taking low-dose aspirin every day. Taking vitamin and mineral supplements as recommended by your health care provider. What happens during an annual well check? The services and screenings done by your health care provider during your annual well check will depend on your age, overall health, lifestyle risk factors, and family history of disease. Counseling  Your health care provider may ask you questions about your: Alcohol use. Tobacco use. Drug use. Emotional well-being. Home and relationship well-being. Sexual activity. Eating  habits. History of falls. Memory and ability to understand (cognition). Work and work Statistician. Reproductive health. Screening  You may have the following tests or measurements: Height, weight, and BMI. Blood pressure. Lipid and cholesterol levels. These may be checked every 5 years, or more frequently if you are over 52 years old. Skin check. Lung cancer screening. You may have this screening every year starting at age 54 if you have a 30-pack-year history of smoking and currently smoke or have quit within the past 15 years. Fecal occult blood test (FOBT) of the stool. You may have this test every year starting at age 42. Flexible sigmoidoscopy or colonoscopy. You may have a sigmoidoscopy every 5 years or a colonoscopy every 10 years starting at age 56. Hepatitis C blood test. Hepatitis B blood test. Sexually transmitted disease (STD) testing. Diabetes screening. This is done by checking your blood sugar (glucose) after you have not eaten for a while (fasting). You may have this done every 1-3 years. Bone density scan. This is done to screen for osteoporosis. You may have this done starting at age 75. Mammogram. This may be done every 1-2 years. Talk to your health care provider about how often you should have regular mammograms. Talk with your health care provider about your test results, treatment options, and if necessary, the need for more tests. Vaccines  Your health care provider may recommend certain vaccines, such as: Influenza vaccine. This is recommended every year. Tetanus, diphtheria, and acellular pertussis (Tdap, Td) vaccine. You may need a Td booster every 10 years. Zoster vaccine. You may need this after age 61. Pneumococcal 13-valent conjugate (PCV13) vaccine. One dose is recommended after age 8. Pneumococcal polysaccharide (PPSV23) vaccine. One dose is recommended after age 80. Talk to your health  care provider about which screenings and vaccines you need and how  often you need them. This information is not intended to replace advice given to you by your health care provider. Make sure you discuss any questions you have with your health care provider. Document Released: 10/25/2015 Document Revised: 06/17/2016 Document Reviewed: 07/30/2015 Elsevier Interactive Patient Education  2017 [place] Prevention in the Home Falls can cause injuries. They can happen to people of all ages. There are many things you can do to make your home safe and to help prevent falls. What can I do on the outside of my home? Regularly fix the edges of walkways and driveways and fix any cracks. Remove anything that might make you trip as you walk through a door, such as a raised step or threshold. Trim any bushes or trees on the path to your home. Use bright outdoor lighting. Clear any walking paths of anything that might make someone trip, such as rocks or tools. Regularly check to see if handrails are loose or broken. Make sure that both sides of any steps have handrails. Any raised decks and porches should have guardrails on the edges. Have any leaves, snow, or ice cleared regularly. Use sand or salt on walking paths during winter. Clean up any spills in your garage right away. This includes oil or grease spills. What can I do in the bathroom? Use night lights. Install grab bars by the toilet and in the tub and shower. Do not use towel bars as grab bars. Use non-skid mats or decals in the tub or shower. If you need to sit down in the shower, use a plastic, non-slip stool. Keep the floor dry. Clean up any water that spills on the floor as soon as it happens. Remove soap buildup in the tub or shower regularly. Attach bath mats securely with double-sided non-slip rug tape. Do not have throw rugs and other things on the floor that can make you trip. What can I do in the bedroom? Use night lights. Make sure that you have a light by your bed that is easy to  reach. Do not use any sheets or blankets that are too big for your bed. They should not hang down onto the floor. Have a firm chair that has side arms. You can use this for support while you get dressed. Do not have throw rugs and other things on the floor that can make you trip. What can I do in the kitchen? Clean up any spills right away. Avoid walking on wet floors. Keep items that you use a lot in easy-to-reach places. If you need to reach something above you, use a strong step stool that has a grab bar. Keep electrical cords out of the way. Do not use floor polish or wax that makes floors slippery. If you must use wax, use non-skid floor wax. Do not have throw rugs and other things on the floor that can make you trip. What can I do with my stairs? Do not leave any items on the stairs. Make sure that there are handrails on both sides of the stairs and use them. Fix handrails that are broken or loose. Make sure that handrails are as long as the stairways. Check any carpeting to make sure that it is firmly attached to the stairs. Fix any carpet that is loose or worn. Avoid having throw rugs at the top or bottom of the stairs. If you do have throw rugs, attach them to  the floor with carpet tape. Make sure that you have a light switch at the top of the stairs and the bottom of the stairs. If you do not have them, ask someone to add them for you. What else can I do to help prevent falls? Wear shoes that: Do not have high heels. Have rubber bottoms. Are comfortable and fit you well. Are closed at the toe. Do not wear sandals. If you use a stepladder: Make sure that it is fully opened. Do not climb a closed stepladder. Make sure that both sides of the stepladder are locked into place. Ask someone to hold it for you, if possible. Clearly mark and make sure that you can see: Any grab bars or handrails. First and last steps. Where the edge of each step is. Use tools that help you move  around (mobility aids) if they are needed. These include: Canes. Walkers. Scooters. Crutches. Turn on the lights when you go into a dark area. Replace any light bulbs as soon as they burn out. Set up your furniture so you have a clear path. Avoid moving your furniture around. If any of your floors are uneven, fix them. If there are any pets around you, be aware of where they are. Review your medicines with your doctor. Some medicines can make you feel dizzy. This can increase your chance of falling. Ask your doctor what other things that you can do to help prevent falls. This information is not intended to replace advice given to you by your health care provider. Make sure you discuss any questions you have with your health care provider. Document Released: 07/25/2009 Document Revised: 03/05/2016 Document Reviewed: 11/02/2014 Elsevier Interactive Patient Education  2017 [employer].

## 2022-07-27 NOTE — Progress Notes (Signed)
Subjective:   DEVYNNE STURDIVANT is a 88 y.o. female who presents for Medicare Annual (Subsequent) preventive examination.  Review of Systems           Objective:    There were no vitals filed for this visit. There is no height or weight on file to calculate BMI.     01/23/2022    2:13 PM 08/01/2021   11:41 AM 07/07/2021    2:17 PM 07/07/2021    1:28 PM 01/15/2021    1:09 PM 09/13/2020    1:57 PM 07/05/2020    2:42 PM  Advanced Directives  Does Patient Have a Medical Advance Directive? Yes Yes Yes Yes Yes Yes Yes  Type of Paramedic of [place];Living will [place];Living will [name];Living will [place];Living will [place];Living will [place];Living will [place];Living will  Does patient want to make changes to medical advance directive? No - Patient declined No - Patient declined No - Patient declined No - Patient declined No - Patient declined No - Patient declined No - Patient declined  Copy of [place] in Chart? Yes - validated most recent copy scanned in chart (See row information) Yes - validated most recent copy scanned in chart (See row information) Yes - validated most recent copy scanned in chart (See row information) Yes - validated most recent copy scanned in chart (See row information) Yes - validated most recent copy scanned in chart (See row information)  Yes - validated most recent copy scanned in chart (See row information)    Current Medications (verified) Outpatient Encounter Medications as of 07/27/2022  Medication Sig   acetaminophen (TYLENOL) 650 MG CR tablet Take 650 mg by mouth 2 (two) times daily.    carboxymethylcellulose (REFRESH PLUS) 0.5 % SOLN Place 1 drop into both eyes 3 (three) times daily as needed. Uses more frequently in left eye as needed.   cholecalciferol (VITAMIN D) 1000 UNITS  tablet Take 1,000 Units by mouth 2 (two) times daily.   gabapentin (NEURONTIN) 300 MG capsule TAKE 1 CAPSULE EVERY DAY   Krill Oil 350 MG CAPS Take 1 capsule by mouth daily.   losartan (COZAAR) 50 MG tablet TAKE 1 TABLET EVERY DAY   metoprolol tartrate (LOPRESSOR) 25 MG tablet TAKE 1 TABLET EVERY DAY   oxybutynin (DITROPAN) 5 MG tablet TAKE 1 TABLET TWICE DAILY (SUBSTITUTED FOR DITROPAN)   pantoprazole (PROTONIX) 40 MG tablet TAKE 1 TABLET EVERY DAY   venlafaxine XR (EFFEXOR-XR) 37.5 MG 24 hr capsule TAKE 1 CAPSULE EVERY DAY WITH BREAKFAST   No facility-administered encounter medications on file as of 07/27/2022.    Allergies (verified) Codeine, Dilaudid [hydromorphone hcl], Macrodantin, Morphine and related, Sulfa antibiotics, and Ibuprofen   History: Past Medical History:  Diagnosis Date   Basal cell cancer    "face, legs" (10/18/2012)   Bruises easily    Chronic lower back pain    Depression    takes Effexor daily   Diverticulosis of sigmoid colon 11-20-2009   Dry eyes    uses Refresh eye drops daily as needed   Dysphagia    Gastric ulcer    GERD (gastroesophageal reflux disease)    takes Omeprazole daily   H/O hiatal hernia    Hemochromatosis 1987   Hemochromatosis    Hemorrhoids, internal 11-20-09   History of blood transfusion    "S/P colonoscopy after polyp removed; got 2  units; esophageal bleed got 1 unit; really messed up hemochromatosis" (1/7/20214)   History of bronchitis 1991-1996   "chronic; related to winter" (10/18/2012)   History of colonic polyps    NOTED 11-20-09 IN COLONOSCOPY REPORT   Hyperlipidemia    no meds required   Hypertension    takes Metoprolol and Losartan daily   Joint pain    Joint swelling    Muscle pain    Muscle spasm    takes Robaxin daily as needed   Occasional tremors    Osteoarthritis    Osteoarthritis of right hip 10/18/2012   Osteoporosis    Overactive bladder    takes Myrbetriq daily   PONV (postoperative nausea and vomiting)     Rheumatic fever    hx of   Squamous carcinoma    "LLE" (10/18/2012)   Stenosis of esophagus    Tremor, essential 10-29-09   Urinary urgency    takes Ditropan daily   Vomiting    occasionally   Past Surgical History:  Procedure Laterality Date   APPENDECTOMY  1952   CATARACT EXTRACTION W/ INTRAOCULAR LENS  IMPLANT, BILATERAL  2001   "both eyes" (10/18/2012)   [place] ARTHROSCOPY W/ [place]?   "left" (10/18/2012)   SKIN CANCER EXCISION     "multiple" (10/18/2012)   [place]   TOTAL HIP ARTHROPLASTY  10/18/2012   "right" (10/18/2012)   TOTAL HIP ARTHROPLASTY  10/18/2012   Procedure: TOTAL HIP ARTHROPLASTY;  Surgeon: Johnny Bridge, MD;  Location: [facility];  Service: Orthopedics;  Laterality: Right;   TOTAL HIP ARTHROPLASTY Left 07/24/2014   dr Mardelle Matte   TOTAL HIP ARTHROPLASTY Left 07/24/2014   Procedure: LEFT TOTAL HIP ARTHROPLASTY;  Surgeon: Johnny Bridge, MD;  Location: [facility];  Service: Orthopedics;  Laterality: Left;   TUBAL LIGATION  1975   Family History  Problem Relation Age of Onset   Heart disease Mother    Heart disease Father    Hyperlipidemia Daughter    Heart disease Brother    Heart disease Brother    Heart disease Brother    Heart disease Brother    Cancer Sister    Melanoma Sister    Alzheimer's disease Sister    Heart disease Sister    Osteoporosis Sister    Social History   Socioeconomic History   Marital status: Widowed    Spouse name: Not on file   Number of children: Not on file   Years of education: Not on file   Highest education level: Not on file  Occupational History   Not on file  Tobacco Use   Smoking status: Never   Smokeless tobacco: Never  Vaping Use   Vaping Use: Never used  Substance and Sexual Activity   Alcohol use: No   Drug use: No   Sexual activity: Never     Birth control/protection: Post-menopausal  Other Topics Concern   Not on file  Social History Narrative   Not on file   Social Determinants of Health   Financial Resource Strain: Low Risk  (06/20/2018)   Overall Financial Resource Strain (CARDIA)    Difficulty of Paying Living Expenses: Not hard at all  Food Insecurity: No Food Insecurity (06/20/2018)   Hunger Vital Sign    Worried About Running Out  of Food in the Last Year: Never true    [place] in the Last Year: Never true  Transportation Needs: No Transportation Needs (06/20/2018)   PRAPARE - Hydrologist (Medical): No    Lack of Transportation (Non-Medical): No  Physical Activity: Inactive (06/20/2018)   Exercise Vital Sign    Days of Exercise per Week: 0 days    Minutes of Exercise per Session: 0 min  Stress: No Stress Concern Present (06/20/2018)   Peak    Feeling of Stress : Only a little  Social Connections: Moderately Isolated (06/20/2018)   Social Connection and Isolation Panel [NHANES]    Frequency of Communication with Friends and Family: Three times a week    Frequency of Social Gatherings with Friends and Family: Once a week    Attends Religious Services: Never    Marine scientist or Organizations: No    Attends Archivist Meetings: Never    Marital Status: Widowed    Tobacco Counseling Counseling given: Not Answered   Clinical Intake:  Pre-visit preparation completed: Yes              Diabetic?no         Activities of Daily Living     No data to display          Patient Care Team: Lauree Chandler, NP as PCP - General (Geriatric Medicine)  Indicate any recent Medical Services you may have received from other than [facility] providers in the past year (date may be approximate).     Assessment:   This is a routine wellness examination for [place].  Hearing/Vision  screen No results found.  Dietary issues and exercise activities discussed:     Goals Addressed   None    Depression Screen    01/23/2022    2:22 PM 07/07/2021    2:13 PM 07/05/2020    2:42 PM 07/04/2020    1:11 PM 06/27/2019   10:45 AM 06/20/2018    2:38 PM 12/02/2017    2:40 PM  PHQ 2/9 Scores  PHQ - 2 Score 0 0 0 0 0 0 0    Fall Risk    01/23/2022    2:21 PM 08/01/2021    2:05 PM 07/07/2021    2:13 PM 07/05/2020    2:42 PM 07/04/2020    1:14 PM  Fall Risk   Falls in the past year? 1 0 0 0 0  Number falls in past yr: 0 0 0 0 0  Injury with Fall? 0 0 0 0 0  Risk for fall due to : No Fall Risks No Fall Risks No Fall Risks    Follow up Falls evaluation completed Falls evaluation completed Falls evaluation completed      [place]:  Any stairs in or around the home? Yes  If so, are there any without handrails? No  Home free of loose throw rugs in walkways, pet beds, electrical cords, etc? Yes  Adequate lighting in your home to reduce risk of falls? Yes   ASSISTIVE DEVICES UTILIZED TO PREVENT FALLS:  Life alert? No  Use of a cane, walker or w/c? Yes  Grab bars in the bathroom? Yes  Shower chair or bench in shower? Does not use shower Elevated toilet seat or a handicapped toilet? Yes   TIMED UP AND GO:  Was the test performed? No .  Cognitive Function:    06/20/2018    2:44 PM 11/19/2016    1:16 PM  MMSE - Mini Mental State Exam  Orientation to time 4 5  Orientation to Place 5 5  Registration 3 3  Attention/ Calculation 5 5  Recall 0 3  Language- name 2 objects 2 2  Language- repeat 1 1  Language- follow 3 step command 3 3  Language- read & follow direction 1 1  Write a sentence 1 1  Copy design 1 1  Total score 26 30        07/07/2021    2:18 PM 07/04/2020    1:15 PM 06/27/2019   10:46 AM  6CIT Screen  What Year? 0 points 0 points 0 points  What month? 0 points 0 points 0 points  What time? 0 points 0 points 0 points   Count back from 20 0 points 0 points 0 points  Months in reverse 0 points 0 points 0 points  Repeat phrase 2 points 4 points 0 points  Total Score 2 points 4 points 0 points    Immunizations Immunization History  Administered Date(s) Administered   Fluad Quad(high Dose 65+) 07/03/2019, 06/12/2020   Influenza, High Dose Seasonal PF 07/08/2016, 07/18/2017, 06/20/2018, 07/05/2021   Influenza-Unspecified 06/12/2012, 07/04/2013, 06/23/2014, 07/09/2015   Moderna Covid-19 Vaccine Bivalent Booster 65yr & up 08/16/2021   Moderna SARS-COV2 Booster Vaccination 08/07/2020, 03/29/2021   Moderna Sars-Covid-2 Vaccination 11/25/2019, 12/23/2019   Pneumococcal Conjugate-13 05/14/2016   Pneumococcal Polysaccharide-23 07/25/2014   Td 04/19/2007   Tdap 05/27/2013   Zoster Recombinat (Shingrix) 05/24/2017, 08/23/2017   Zoster, Live 09/24/2011    TDAP status: Up to date  Flu Vaccine status: Due, Education has been provided regarding the importance of this vaccine. Advised may receive this vaccine at local pharmacy or Health Dept. Aware to provide a copy of the vaccination record if obtained from local pharmacy or Health Dept. Verbalized acceptance and understanding.  Pneumococcal vaccine status: Up to date  Covid-19 vaccine status: Information provided on how to obtain vaccines.   Qualifies for Shingles Vaccine? Yes   Zostavax completed Yes   Shingrix Completed?: Yes  Screening Tests Health Maintenance  Topic Date Due   COVID-19 Vaccine (4 - Moderna series) 12/14/2021   INFLUENZA VACCINE  05/12/2022   TETANUS/TDAP  05/28/2023   Pneumonia Vaccine 88 Years old  Completed   DEXA SCAN  Completed   Zoster Vaccines- Shingrix  Completed   HPV VACCINES  Aged Out    Health Maintenance  Health Maintenance Due  Topic Date Due   COVID-19 Vaccine (4 - Moderna series) 12/14/2021   INFLUENZA VACCINE  05/12/2022    Colorectal cancer screening: No longer required.   Mammogram status: No longer  required due to age.  Declines bone density  Lung Cancer Screening: (Low Dose CT Chest recommended if Age 86-80years, 30 pack-year currently smoking OR have quit w/in 15years.) does not qualify.   Lung Cancer Screening Referral: na  Additional Screening:  Hepatitis C Screening: does not qualify  Vision Screening: Recommended annual ophthalmology exams for early detection of glaucoma and other disorders of the eye. Is the patient up to date with their annual eye exam?  Yes  Who is the provider or what is the name of the office in which the patient attends annual eye exams? [facility] If pt is not established with a provider, would they like to be referred to a provider to establish care? No .   Dental Screening:  Recommended annual dental exams for proper oral hygiene  Community Resource Referral / Chronic Care Management: CRR required this visit?  No   CCM required this visit?  No      Plan:     I have personally reviewed and noted the following in the patient's chart:   Medical and social history Use of alcohol, tobacco or illicit drugs  Current medications and supplements including opioid prescriptions. Patient is not currently taking opioid prescriptions. Functional ability and status Nutritional status Physical activity Advanced directives List of other physicians Hospitalizations, surgeries, and ER visits in previous 12 months Vitals Screenings to include cognitive, depression, and falls Referrals and appointments  In addition, I have reviewed and discussed with patient certain preventive protocols, quality metrics, and best practice recommendations. A written personalized care plan for preventive services as well as general preventive health recommendations were provided to patient.     Lauree Chandler, NP   07/27/2022   Virtual Visit via Telephone Note  I connected with patient 07/27/22 at  1:40 PM EDT by telephone and verified that I am speaking with the  correct person using two identifiers.  Location: Patient: home Provider: [place]   I discussed the limitations, risks, security and privacy concerns of performing an evaluation and management service by telephone and the availability of in person appointments. I also discussed with the patient that there may be a patient responsible charge related to this service. The patient expressed understanding and agreed to proceed.   I discussed the assessment and treatment plan with the patient. The patient was provided an opportunity to ask questions and all were answered. The patient agreed with the plan and demonstrated an understanding of the instructions.   The patient was advised to call back or seek an in-person evaluation if the symptoms worsen or if the condition fails to improve as anticipated.  I provided 15 minutes of non-face-to-face time during this encounter.  Carlos American. Harle Battiest Avs printed and mailed

## 2022-07-31 ENCOUNTER — Encounter: Payer: Self-pay | Admitting: Nurse Practitioner

## 2022-07-31 ENCOUNTER — Ambulatory Visit (INDEPENDENT_AMBULATORY_CARE_PROVIDER_SITE_OTHER): Payer: Medicare HMO | Admitting: Nurse Practitioner

## 2022-07-31 VITALS — BP 100/70 | HR 69 | Temp 97.8°F | Resp 16 | Ht 63.0 in | Wt 114.0 lb

## 2022-07-31 DIAGNOSIS — I10 Essential (primary) hypertension: Secondary | ICD-10-CM

## 2022-07-31 DIAGNOSIS — K219 Gastro-esophageal reflux disease without esophagitis: Secondary | ICD-10-CM

## 2022-07-31 DIAGNOSIS — F419 Anxiety disorder, unspecified: Secondary | ICD-10-CM

## 2022-07-31 DIAGNOSIS — M159 Polyosteoarthritis, unspecified: Secondary | ICD-10-CM | POA: Diagnosis not present

## 2022-07-31 DIAGNOSIS — G629 Polyneuropathy, unspecified: Secondary | ICD-10-CM | POA: Diagnosis not present

## 2022-07-31 DIAGNOSIS — F32A Depression, unspecified: Secondary | ICD-10-CM

## 2022-07-31 NOTE — Progress Notes (Signed)
Careteam: Patient Care Team: Lauree Chandler, NP as PCP - General (Geriatric Medicine)  PLACE OF SERVICE:  [place] Directive information Does Patient Have a Medical Advance Directive?: Yes, Type of Advance Directive: [place];Living will, Does patient want to make changes to medical advance directive?: No - Patient declined  Allergies  Allergen Reactions   Codeine Nausea And Vomiting   Dilaudid [Hydromorphone Hcl] Hives and Other (See Comments)    "whelps" (10/18/2012)   Macrodantin Hives and Other (See Comments)    "drug fever; chills; felt terrible" (10/18/2012)   Morphine And Related Hives and Nausea And Vomiting   Sulfa Antibiotics Hives, Rash and Other (See Comments)    "got real sick" (10/18/2012)   Ibuprofen Other (See Comments)    "felt like I have to go to the bathroom constantly"    Chief Complaint  Patient presents with   Medical Management of Chronic Issues    6 month follow up.   Immunizations    Discuss the need for [employer].     HPI: Patient is a 88 y.o. female for routine follow up  Reports she is doing well except for left hip pain. Daughter has offered to take her to the orthopedic for ongoing evaluation and possible injection  She is using tylenol which does not help that much. She has a topical cream that helps.  Also has low back discomfort.   She lives a lone and is very active. continues to do her own hair. Cuts and rolls.   Ongoing follow up with hereditary hemochromatosis- upcoming appt and labs  Oab- stable-  has side effects from medication but prefers this over the increase in urination .  Mood unchanged and stable.   Review of Systems:  Review of Systems  Constitutional:  Negative for chills, fever and weight loss.  HENT:  Negative for tinnitus.   Respiratory:  Negative for cough, sputum production and shortness of breath.   Cardiovascular:  Negative for chest pain, palpitations and leg swelling.   Gastrointestinal:  Negative for abdominal pain, constipation, diarrhea and heartburn.  Genitourinary:  Negative for dysuria, frequency and urgency.  Musculoskeletal:  Positive for back pain, joint pain and myalgias. Negative for falls.  Skin: Negative.   Neurological:  Negative for dizziness and headaches.  Psychiatric/Behavioral:  Negative for depression and memory loss. The patient does not have insomnia.     Past Medical History:  Diagnosis Date   Basal cell cancer    "face, legs" (10/18/2012)   Bruises easily    Chronic lower back pain    Depression    takes Effexor daily   Diverticulosis of sigmoid colon 11-20-2009   Dry eyes    uses Refresh eye drops daily as needed   Dysphagia    Gastric ulcer    GERD (gastroesophageal reflux disease)    takes Omeprazole daily   H/O hiatal hernia    Hemochromatosis 1987   Hemochromatosis    Hemorrhoids, internal 11-20-09   History of blood transfusion    "S/P colonoscopy after polyp removed; got 2 units; esophageal bleed got 1 unit; really messed up hemochromatosis" (1/7/20214)   History of bronchitis 1991-1996   "chronic; related to winter" (10/18/2012)   History of colonic polyps    NOTED 11-20-09 IN COLONOSCOPY REPORT   Hyperlipidemia    no meds required   Hypertension    takes Metoprolol and Losartan daily   Joint pain    Joint swelling  Muscle pain    Muscle spasm    takes Robaxin daily as needed   Occasional tremors    Osteoarthritis    Osteoarthritis of right hip 10/18/2012   Osteoporosis    Overactive bladder    takes Myrbetriq daily   PONV (postoperative nausea and vomiting)    Rheumatic fever    hx of   Squamous carcinoma    "LLE" (10/18/2012)   Stenosis of esophagus    Tremor, essential 10-29-09   Urinary urgency    takes Ditropan daily   Vomiting    occasionally   Past Surgical History:  Procedure Laterality Date   APPENDECTOMY  1952   CATARACT EXTRACTION W/ INTRAOCULAR LENS  IMPLANT, BILATERAL  2001   "both  eyes" (10/18/2012)   [place]?   "left" (10/18/2012)   SKIN CANCER EXCISION     "multiple" (10/18/2012)   [place] ARTHROPLASTY  10/18/2012   "right" (10/18/2012)   TOTAL HIP ARTHROPLASTY  10/18/2012   Procedure: TOTAL HIP ARTHROPLASTY;  Surgeon: Johnny Bridge, MD;  Location: [facility];  Service: Orthopedics;  Laterality: Right;   TOTAL HIP ARTHROPLASTY Left 07/24/2014   dr Mardelle Matte   TOTAL HIP ARTHROPLASTY Left 07/24/2014   Procedure: LEFT TOTAL HIP ARTHROPLASTY;  Surgeon: Johnny Bridge, MD;  Location: [facility];  Service: Orthopedics;  Laterality: Left;   TUBAL LIGATION  1975   Social History:   reports that she has never smoked. She has never used smokeless tobacco. She reports that she does not drink alcohol and does not use drugs.  Family History  Problem Relation Age of Onset   Heart disease Mother    Heart disease Father    Hyperlipidemia Daughter    Heart disease Brother    Heart disease Brother    Heart disease Brother    Heart disease Brother    Cancer Sister    Melanoma Sister    Alzheimer's disease Sister    Heart disease Sister    Osteoporosis Sister     Medications: Patient's Medications  New Prescriptions   No medications on file  Previous Medications   ACETAMINOPHEN (TYLENOL) 650 MG CR TABLET    Take 650 mg by mouth 2 (two) times daily.    CARBOXYMETHYLCELLULOSE (REFRESH PLUS) 0.5 % SOLN    Place 1 drop into both eyes 3 (three) times daily as needed. Uses more frequently in left eye as needed.   CHOLECALCIFEROL (VITAMIN D) 1000 UNITS TABLET    Take 1,000 Units by mouth 2 (two) times daily.   GABAPENTIN (NEURONTIN) 300 MG CAPSULE    TAKE 1 CAPSULE EVERY DAY   KRILL OIL 350 MG CAPS    Take 1 capsule by mouth daily.   LOSARTAN (COZAAR) 50 MG TABLET    TAKE 1 TABLET EVERY  DAY   METOPROLOL TARTRATE (LOPRESSOR) 25 MG TABLET    TAKE 1 TABLET EVERY DAY   OXYBUTYNIN (DITROPAN) 5 MG TABLET    TAKE 1 TABLET TWICE DAILY (SUBSTITUTED FOR DITROPAN)   PANTOPRAZOLE (PROTONIX) 40 MG TABLET    TAKE 1 TABLET EVERY DAY   VENLAFAXINE XR (EFFEXOR-XR) 37.5 MG 24 HR CAPSULE    TAKE 1 CAPSULE EVERY DAY WITH BREAKFAST  Modified Medications   No medications  on file  Discontinued Medications   No medications on file    Physical Exam:  Vitals:   07/31/22 1305  BP: 100/70  Pulse: 69  Resp: 16  Temp: 97.8 F (36.6 C)  SpO2: 96%  Weight: 114 lb (51.7 kg)  Height: '5\' 3"'$  (1.6 m)   Body mass index is 20.19 kg/m. Wt Readings from Last 3 Encounters:  07/31/22 114 lb (51.7 kg)  05/08/22 111 lb 1 oz (50.4 kg)  01/23/22 111 lb (50.3 kg)    Physical Exam Constitutional:      General: She is not in acute distress.    Appearance: She is well-developed. She is not diaphoretic.  HENT:     Head: Normocephalic and atraumatic.     Mouth/Throat:     Pharynx: No oropharyngeal exudate.  Eyes:     Conjunctiva/sclera: Conjunctivae normal.     Pupils: Pupils are equal, round, and reactive to light.  Cardiovascular:     Rate and Rhythm: Normal rate and regular rhythm.     Heart sounds: Normal heart sounds.  Pulmonary:     Effort: Pulmonary effort is normal.     Breath sounds: Normal breath sounds.  Abdominal:     General: Bowel sounds are normal.     Palpations: Abdomen is soft.  Musculoskeletal:     Cervical back: Normal range of motion and neck supple.     Right lower leg: No edema.     Left lower leg: No edema.  Skin:    General: Skin is warm and dry.  Neurological:     Mental Status: She is alert.     Gait: Gait abnormal (uses a cane, slow gait).  Psychiatric:        Mood and Affect: Mood normal.     Labs reviewed: Basic Metabolic Panel: Recent Labs    01/09/22 1150 05/08/22 1259  NA 140 141  K 4.1 3.9  CL 102 104  CO2 32 32  GLUCOSE 106* 115*  BUN 29*  28*  CREATININE 0.69 0.72  CALCIUM 9.7 9.6   Liver Function Tests: Recent Labs    01/09/22 1150 05/08/22 1259  AST 17 17  ALT 10 10  ALKPHOS 100 77  BILITOT 0.7 0.8  PROT 7.0 7.0  ALBUMIN 4.1 4.2   No results for input(s): "LIPASE", "AMYLASE" in the last 8760 hours. No results for input(s): "AMMONIA" in the last 8760 hours. CBC: Recent Labs    09/12/21 1157 01/09/22 1150 05/08/22 1259  WBC 7.8 6.6 6.3  NEUTROABS 5.3 4.0 3.9  HGB 12.3 12.1 12.3  HCT 36.6 36.7 36.6  MCV 100.0 97.9 99.2  PLT 235 259 232   Lipid Panel: No results for input(s): "CHOL", "HDL", "LDLCALC", "TRIG", "CHOLHDL", "LDLDIRECT" in the last 8760 hours. TSH: No results for input(s): "TSH" in the last 8760 hours. A1C: No results found for: "HGBA1C"   Assessment/Plan 1. Essential hypertension -Blood pressure well controlled, goal bp <140/90 Continue current medications and dietary modifications follow metabolic panel  2. Gastroesophageal reflux disease without esophagitis -stable, continues on protonix daily  3. Neuropathy -stable, continues on gabapentin  4. Osteoarthritis of multiple joints, unspecified osteoarthritis type -ongoing, worsening pain in left hip, encouraged ortho follow up for evaluation and treatment. Continues on tylenol PRN  5. Hereditary hemochromatosis ([place]) -stable, continues to follow up with hematology.  6. Anxiety and depression -stable on effexor, continue current dosing.   Return in about 6 months (around 01/30/2023) for routine follow up .  Janett Billow  Beaulah Corin, [facility] [phone]

## 2022-08-12 ENCOUNTER — Other Ambulatory Visit: Payer: Self-pay | Admitting: Nurse Practitioner

## 2022-08-28 ENCOUNTER — Other Ambulatory Visit: Payer: Self-pay

## 2022-08-28 ENCOUNTER — Inpatient Hospital Stay: Payer: Medicare HMO | Attending: Hematology

## 2022-08-28 ENCOUNTER — Inpatient Hospital Stay: Payer: Medicare HMO

## 2022-08-28 LAB — CMP (CANCER CENTER ONLY)
ALT: 10 U/L (ref 0–44)
AST: 18 U/L (ref 15–41)
Albumin: 4.4 g/dL (ref 3.5–5.0)
Alkaline Phosphatase: 73 U/L (ref 38–126)
Anion gap: 5 (ref 5–15)
BUN: 22 mg/dL (ref 8–23)
CO2: 32 mmol/L (ref 22–32)
Calcium: 10 mg/dL (ref 8.9–10.3)
Chloride: 102 mmol/L (ref 98–111)
Creatinine: 0.62 mg/dL (ref 0.44–1.00)
GFR, Estimated: >60 mL/min (ref >60)
Glucose, Bld: 104 mg/dL — ABNORMAL HIGH (ref 70–99)
Potassium: 3.7 mmol/L (ref 3.5–5.1)
Sodium: 139 mmol/L (ref 135–145)
Total Bilirubin: 0.8 mg/dL (ref 0.3–1.2)
Total Protein: 7.1 g/dL (ref 6.5–8.1)

## 2022-08-28 LAB — CBC WITH DIFFERENTIAL (CANCER CENTER ONLY)
Abs Immature Granulocytes: 0.02 10*3/uL (ref 0.00–0.07)
Basophils Absolute: 0 10*3/uL (ref 0.0–0.1)
Basophils Relative: 1 %
Eosinophils Absolute: 0.1 10*3/uL (ref 0.0–0.5)
Eosinophils Relative: 1 %
HCT: 36.1 % (ref 36.0–46.0)
Hemoglobin: 12.3 g/dL (ref 12.0–15.0)
Immature Granulocytes: 0 %
Lymphocytes Relative: 24 %
Lymphs Abs: 1.9 10*3/uL (ref 0.7–4.0)
MCH: 33.6 pg (ref 26.0–34.0)
MCHC: 34.1 g/dL (ref 30.0–36.0)
MCV: 98.6 fL (ref 80.0–100.0)
Monocytes Absolute: 0.7 10*3/uL (ref 0.1–1.0)
Monocytes Relative: 8 %
Neutro Abs: 5.3 10*3/uL (ref 1.7–7.7)
Neutrophils Relative %: 66 %
Platelet Count: 245 10*3/uL (ref 150–400)
RBC: 3.66 MIL/uL — ABNORMAL LOW (ref 3.87–5.11)
RDW: 11.9 % (ref 11.5–15.5)
WBC Count: 8 10*3/uL (ref 4.0–10.5)
nRBC: 0 % (ref 0.0–0.2)

## 2022-08-28 LAB — IRON AND IRON BINDING CAPACITY (CC-WL,HP ONLY)
Iron: 244 ug/dL — ABNORMAL HIGH (ref 28–170)
Saturation Ratios: 91 % — ABNORMAL HIGH (ref 10.4–31.8)
TIBC: 267 ug/dL (ref 250–450)
UIBC: 23 ug/dL — ABNORMAL LOW (ref 148–442)

## 2022-08-28 LAB — FERRITIN: Ferritin: 33 ng/mL (ref 11–307)

## 2022-08-28 MED ORDER — SODIUM CHLORIDE 0.9 % IV SOLN: Freq: Once | INTRAVENOUS | Status: AC

## 2022-08-28 NOTE — Patient Instructions (Signed)

## 2022-10-02 ENCOUNTER — Other Ambulatory Visit: Payer: Self-pay | Admitting: Nurse Practitioner

## 2022-10-02 DIAGNOSIS — K219 Gastro-esophageal reflux disease without esophagitis: Secondary | ICD-10-CM

## 2022-10-16 DIAGNOSIS — M542 Cervicalgia: Secondary | ICD-10-CM | POA: Diagnosis not present

## 2022-10-16 DIAGNOSIS — M25511 Pain in right shoulder: Secondary | ICD-10-CM | POA: Diagnosis not present

## 2022-11-13 DIAGNOSIS — G5602 Carpal tunnel syndrome, left upper limb: Secondary | ICD-10-CM | POA: Diagnosis not present

## 2022-11-13 DIAGNOSIS — M542 Cervicalgia: Secondary | ICD-10-CM | POA: Diagnosis not present

## 2022-11-13 DIAGNOSIS — M25511 Pain in right shoulder: Secondary | ICD-10-CM | POA: Diagnosis not present

## 2022-11-13 DIAGNOSIS — G5601 Carpal tunnel syndrome, right upper limb: Secondary | ICD-10-CM | POA: Diagnosis not present

## 2022-12-30 DIAGNOSIS — H524 Presbyopia: Secondary | ICD-10-CM | POA: Diagnosis not present

## 2022-12-31 ENCOUNTER — Other Ambulatory Visit: Payer: Self-pay

## 2023-01-01 ENCOUNTER — Other Ambulatory Visit: Payer: Self-pay

## 2023-01-01 ENCOUNTER — Inpatient Hospital Stay: Payer: Medicare HMO | Attending: Hematology

## 2023-01-01 ENCOUNTER — Inpatient Hospital Stay: Payer: Medicare HMO

## 2023-01-01 LAB — CBC WITH DIFFERENTIAL (CANCER CENTER ONLY)
Abs Immature Granulocytes: 0.01 10*3/uL (ref 0.00–0.07)
Basophils Absolute: 0 10*3/uL (ref 0.0–0.1)
Basophils Relative: 0 %
Eosinophils Absolute: 0.2 10*3/uL (ref 0.0–0.5)
Eosinophils Relative: 3 %
HCT: 38.3 % (ref 36.0–46.0)
Hemoglobin: 12.7 g/dL (ref 12.0–15.0)
Immature Granulocytes: 0 %
Lymphocytes Relative: 22 %
Lymphs Abs: 1.6 10*3/uL (ref 0.7–4.0)
MCH: 31.8 pg (ref 26.0–34.0)
MCHC: 33.2 g/dL (ref 30.0–36.0)
MCV: 96 fL (ref 80.0–100.0)
Monocytes Absolute: 0.7 10*3/uL (ref 0.1–1.0)
Monocytes Relative: 10 %
Neutro Abs: 4.8 10*3/uL (ref 1.7–7.7)
Neutrophils Relative %: 65 %
Platelet Count: 263 10*3/uL (ref 150–400)
RBC: 3.99 MIL/uL (ref 3.87–5.11)
RDW: 12.9 % (ref 11.5–15.5)
WBC Count: 7.3 10*3/uL (ref 4.0–10.5)
nRBC: 0 % (ref 0.0–0.2)

## 2023-01-01 LAB — CMP (CANCER CENTER ONLY)
ALT: 12 U/L (ref 0–44)
AST: 18 U/L (ref 15–41)
Albumin: 4.3 g/dL (ref 3.5–5.0)
Alkaline Phosphatase: 99 U/L (ref 38–126)
Anion gap: 5 (ref 5–15)
BUN: 22 mg/dL (ref 8–23)
CO2: 34 mmol/L — ABNORMAL HIGH (ref 22–32)
Calcium: 9.9 mg/dL (ref 8.9–10.3)
Chloride: 103 mmol/L (ref 98–111)
Creatinine: 0.69 mg/dL (ref 0.44–1.00)
GFR, Estimated: >60 mL/min (ref >60)
Glucose, Bld: 100 mg/dL — ABNORMAL HIGH (ref 70–99)
Potassium: 3.9 mmol/L (ref 3.5–5.1)
Sodium: 142 mmol/L (ref 135–145)
Total Bilirubin: 0.6 mg/dL (ref 0.3–1.2)
Total Protein: 7 g/dL (ref 6.5–8.1)

## 2023-01-01 LAB — SAMPLE TO BLOOD BANK

## 2023-01-01 LAB — IRON AND IRON BINDING CAPACITY (CC-WL,HP ONLY)
Iron: 112 ug/dL (ref 28–170)
Saturation Ratios: 39 % — ABNORMAL HIGH (ref 10.4–31.8)
TIBC: 288 ug/dL (ref 250–450)
UIBC: 176 ug/dL (ref 148–442)

## 2023-01-01 LAB — FERRITIN: Ferritin: 32 ng/mL (ref 11–307)

## 2023-01-01 MED ORDER — SODIUM CHLORIDE 0.9 % IV SOLN: Freq: Once | INTRAVENOUS | Status: AC

## 2023-01-01 NOTE — Progress Notes (Signed)
Denise Lane presents today for phlebotomy per MD orders. Secondary phlebotomy kit with 18G needle used in LAC. Phlebotomy procedure started at 1243 and ended at 1253. 503 cc removed. 524ml of fluid given over 30 minutes post-phlebotomy.  Patient tolerated procedure well. Pt drank water and ate during 30 minute observation period. IV needle removed intact.

## 2023-01-01 NOTE — Patient Instructions (Signed)

## 2023-01-03 LAB — AFP TUMOR MARKER: AFP, Serum, Tumor Marker: <1.8 ng/mL (ref 0.0–8.7)

## 2023-01-13 ENCOUNTER — Other Ambulatory Visit: Payer: Self-pay | Admitting: Nurse Practitioner

## 2023-01-13 DIAGNOSIS — F32A Depression, unspecified: Secondary | ICD-10-CM

## 2023-01-15 ENCOUNTER — Encounter: Payer: Self-pay | Admitting: Hematology

## 2023-01-29 ENCOUNTER — Encounter: Payer: Self-pay | Admitting: Nurse Practitioner

## 2023-01-29 ENCOUNTER — Ambulatory Visit (INDEPENDENT_AMBULATORY_CARE_PROVIDER_SITE_OTHER): Payer: Medicare HMO | Admitting: Nurse Practitioner

## 2023-01-29 VITALS — BP 126/72 | HR 70 | Temp 97.1°F | Ht 63.0 in | Wt 105.2 lb

## 2023-01-29 DIAGNOSIS — N3281 Overactive bladder: Secondary | ICD-10-CM

## 2023-01-29 DIAGNOSIS — R634 Abnormal weight loss: Secondary | ICD-10-CM

## 2023-01-29 DIAGNOSIS — M159 Polyosteoarthritis, unspecified: Secondary | ICD-10-CM | POA: Diagnosis not present

## 2023-01-29 DIAGNOSIS — G629 Polyneuropathy, unspecified: Secondary | ICD-10-CM | POA: Diagnosis not present

## 2023-01-29 DIAGNOSIS — K219 Gastro-esophageal reflux disease without esophagitis: Secondary | ICD-10-CM | POA: Diagnosis not present

## 2023-01-29 DIAGNOSIS — I10 Essential (primary) hypertension: Secondary | ICD-10-CM

## 2023-01-29 DIAGNOSIS — F32A Depression, unspecified: Secondary | ICD-10-CM | POA: Diagnosis not present

## 2023-01-29 DIAGNOSIS — F419 Anxiety disorder, unspecified: Secondary | ICD-10-CM | POA: Diagnosis not present

## 2023-01-29 NOTE — Patient Instructions (Signed)
1.) Visit your local pharmacy to received additional covid boosters and have them fax a copy to [phone]

## 2023-01-29 NOTE — Progress Notes (Signed)
Careteam: Patient Care Team: Sharon Seller, NP as PCP - General (Geriatric Medicine) Malachy Mood, MD as Consulting Physician (Hematology and Oncology)  PLACE OF SERVICE:  [facility]  Advanced Directive information Does Patient Have a Medical Advance Directive?: Yes, Type of Advance Directive: Healthcare Power of [place];Living will, Does patient want to make changes to medical advance directive?: No - Patient declined  Allergies  Allergen Reactions   Codeine Nausea And Vomiting   Dilaudid [Hydromorphone Hcl] Hives and Other (See Comments)    "whelps" (10/18/2012)   Macrodantin Hives and Other (See Comments)    "drug fever; chills; felt terrible" (10/18/2012)   Morphine And Related Hives and Nausea And Vomiting   Sulfa Antibiotics Hives, Rash and Other (See Comments)    "got real sick" (10/18/2012)   Ibuprofen Other (See Comments)    "felt like I have to go to the bathroom constantly"    Chief Complaint  Patient presents with   Medical Management of Chronic Issues    6 month follow-up. Discuss need for additional covid boosters or post pone if patient refuses or is not a candidate. [facility] verified.      HPI: Patient is a 89 y.o. female for routine follow up.   Her lab work has been done through oncology- followed for her hereditary hemochromatosis. Recent phlebotomy  in March.   Has been following with orthopedic due to wrist, neck and shoulder pain. Pain has improved after injections.  Mood- continues on Effexor. Reports she is really not sure how it is going. There is so much she wants to do that she can not do. Daughter helps her a lot.  Ready for the Lord to take her. She is very unsteady. Having more tremors.   Htn-controlled.   Neuropathy-continues on gabapentin.   GERD-no ongoing issues  OAB- stable on oxybutynin. Mild side effects   More issues with memory- gets upset with that. Some days are worse than others.  Daughter has taken care of all bills now.    She does not want to go to a nursing home but if it becomes a safety concern she will have to go.   Review of Systems:  Review of Systems  Constitutional:  Negative for chills, fever and weight loss.  HENT:  Negative for tinnitus.   Respiratory:  Negative for cough, sputum production and shortness of breath.   Cardiovascular:  Negative for chest pain, palpitations and leg swelling.  Gastrointestinal:  Negative for abdominal pain, constipation, diarrhea and heartburn.  Genitourinary:  Positive for frequency. Negative for dysuria and urgency.  Musculoskeletal:  Positive for back pain and joint pain. Negative for falls and myalgias.  Skin: Negative.   Neurological:  Negative for dizziness and headaches.  Psychiatric/Behavioral:  Negative for depression and memory loss. The patient does not have insomnia.     Past Medical History:  Diagnosis Date   Basal cell cancer    "face, legs" (10/18/2012)   Bruises easily    Chronic lower back pain    Depression    takes Effexor daily   Diverticulosis of sigmoid colon 11-20-2009   Dry eyes    uses Refresh eye drops daily as needed   Dysphagia    Gastric ulcer    GERD (gastroesophageal reflux disease)    takes Omeprazole daily   H/O hiatal hernia    Hemochromatosis 1987   Hemochromatosis    Hemorrhoids, internal 11-20-09   History of blood transfusion    "S/P colonoscopy after  polyp removed; got 2 units; esophageal bleed got 1 unit; really messed up hemochromatosis" (1/7/20214)   History of bronchitis 1991-1996   "chronic; related to winter" (10/18/2012)   History of colonic polyps    NOTED 11-20-09 IN COLONOSCOPY REPORT   Hyperlipidemia    no meds required   Hypertension    takes Metoprolol and Losartan daily   Joint pain    Joint swelling    Muscle pain    Muscle spasm    takes Robaxin daily as needed   Occasional tremors    Osteoarthritis    Osteoarthritis of right hip 10/18/2012   Osteoporosis    Overactive bladder    takes  Myrbetriq daily   PONV (postoperative nausea and vomiting)    Rheumatic fever    hx of   Squamous carcinoma    "LLE" (10/18/2012)   Stenosis of esophagus    Tremor, essential 10-29-09   Urinary urgency    takes Ditropan daily   Vomiting    occasionally   Past Surgical History:  Procedure Laterality Date   APPENDECTOMY  1952   CATARACT EXTRACTION W/ INTRAOCULAR LENS  IMPLANT, BILATERAL  2001   "both eyes" (10/18/2012)   CHOLECYSTECTOMY  1977   COLONOSCOPY     DILATION AND CURETTAGE OF UTERUS  1975   ESOPHAGOGASTRODUODENOSCOPY     LUMBAR DISC SURGERY  1989   SHOULDER ARTHROSCOPY W/ ROTATOR CUFF REPAIR  1998?   "left" (10/18/2012)   SKIN CANCER EXCISION     "multiple" (10/18/2012)   TONSILLECTOMY  1953   TOTAL HIP ARTHROPLASTY  10/18/2012   "right" (10/18/2012)   TOTAL HIP ARTHROPLASTY  10/18/2012   Procedure: TOTAL HIP ARTHROPLASTY;  Surgeon: Eulas Post, MD;  Location: [facility] OR;  Service: Orthopedics;  Laterality: Right;   TOTAL HIP ARTHROPLASTY Left 07/24/2014   dr Dion Saucier   TOTAL HIP ARTHROPLASTY Left 07/24/2014   Procedure: LEFT TOTAL HIP ARTHROPLASTY;  Surgeon: Eulas Post, MD;  Location: [facility] OR;  Service: Orthopedics;  Laterality: Left;   TUBAL LIGATION  1975   Social History:   reports that she has never smoked. She has never used smokeless tobacco. She reports that she does not drink alcohol and does not use drugs.  Family History  Problem Relation Age of Onset   Heart disease Mother    Heart disease Father    Hyperlipidemia Daughter    Heart disease Brother    Heart disease Brother    Heart disease Brother    Heart disease Brother    Cancer Sister    Melanoma Sister    Alzheimer's disease Sister    Heart disease Sister    Osteoporosis Sister     Medications: Patient's Medications  New Prescriptions   No medications on file  Previous Medications   ACETAMINOPHEN (TYLENOL) 650 MG CR TABLET    Take 650 mg by mouth 2 (two) times daily.    CARBOXYMETHYLCELLULOSE  (REFRESH PLUS) 0.5 % SOLN    Place 1 drop into both eyes 3 (three) times daily as needed. Uses more frequently in left eye as needed.   CHOLECALCIFEROL (VITAMIN D) 1000 UNITS TABLET    Take 1,000 Units by mouth 2 (two) times daily.   GABAPENTIN (NEURONTIN) 300 MG CAPSULE    TAKE 1 CAPSULE EVERY DAY   KRILL OIL 350 MG CAPS    Take 1 capsule by mouth daily.   LOSARTAN (COZAAR) 50 MG TABLET    TAKE 1 TABLET EVERY DAY  METOPROLOL TARTRATE (LOPRESSOR) 25 MG TABLET    TAKE 1 TABLET EVERY DAY   OXYBUTYNIN (DITROPAN) 5 MG TABLET    TAKE 1 TABLET TWICE DAILY (SUBSTITUTED FOR DITROPAN)   PANTOPRAZOLE (PROTONIX) 40 MG TABLET    TAKE 1 TABLET EVERY DAY   VENLAFAXINE XR (EFFEXOR-XR) 37.5 MG 24 HR CAPSULE    TAKE 1 CAPSULE EVERY DAY WITH BREAKFAST  Modified Medications   No medications on file  Discontinued Medications   No medications on file    Physical Exam:  Vitals:   01/29/23 1026  BP: 126/72  Pulse: 70  Temp: (!) 97.1 F (36.2 C)  SpO2: 96%  Weight: 105 lb 3.2 oz (47.7 kg)  Height: 5\' 3"  (1.6 m)   Body mass index is 18.64 kg/m. Wt Readings from Last 3 Encounters:  01/29/23 105 lb 3.2 oz (47.7 kg)  07/31/22 114 lb (51.7 kg)  05/08/22 111 lb 1 oz (50.4 kg)    Physical Exam Constitutional:      General: She is not in acute distress.    Appearance: She is well-developed. She is not diaphoretic.     Comments: thin  HENT:     Head: Normocephalic and atraumatic.     Mouth/Throat:     Pharynx: No oropharyngeal exudate.  Eyes:     Conjunctiva/sclera: Conjunctivae normal.     Pupils: Pupils are equal, round, and reactive to light.  Cardiovascular:     Rate and Rhythm: Normal rate and regular rhythm.     Heart sounds: Normal heart sounds.  Pulmonary:     Effort: Pulmonary effort is normal.     Breath sounds: Normal breath sounds.  Abdominal:     General: Bowel sounds are normal.     Palpations: Abdomen is soft.  Musculoskeletal:     Right hand: Deformity present.     Left  hand: Deformity present.     Cervical back: Normal range of motion and neck supple.     Right lower leg: No edema.     Left lower leg: No edema.     Comments: Arthritic changes to bilateral hands   Skin:    General: Skin is warm and dry.  Neurological:     Mental Status: She is alert.  Psychiatric:        Mood and Affect: Mood normal.     Labs reviewed: Basic Metabolic Panel: Recent Labs    05/08/22 1259 08/28/22 1300 01/01/23 1142  NA 141 139 142  K 3.9 3.7 3.9  CL 104 102 103  CO2 32 32 34*  GLUCOSE 115* 104* 100*  BUN 28* 22 22  CREATININE 0.72 0.62 0.69  CALCIUM 9.6 10.0 9.9   Liver Function Tests: Recent Labs    05/08/22 1259 08/28/22 1300 01/01/23 1142  AST 17 18 18   ALT 10 10 12   ALKPHOS 77 73 99  BILITOT 0.8 0.8 0.6  PROT 7.0 7.1 7.0  ALBUMIN 4.2 4.4 4.3   No results for input(s): "LIPASE", "AMYLASE" in the last 8760 hours. No results for input(s): "AMMONIA" in the last 8760 hours. CBC: Recent Labs    05/08/22 1259 08/28/22 1300 01/01/23 1142  WBC 6.3 8.0 7.3  NEUTROABS 3.9 5.3 4.8  HGB 12.3 12.3 12.7  HCT 36.6 36.1 38.3  MCV 99.2 98.6 96.0  PLT 232 245 263   Lipid Panel: No results for input(s): "CHOL", "HDL", "LDLCALC", "TRIG", "CHOLHDL", "LDLDIRECT" in the last 8760 hours. TSH: No results for input(s): "TSH" in the last  8760 hours. A1C: No results found for: "HGBA1C"   Assessment/Plan 1. Essential hypertension -Blood pressure well controlled, goal bp <140/90 Continue current medications and dietary modifications follow metabolic panel  2. Gastroesophageal reflux disease without esophagitis -controlled on protonix, no ongoing symptoms  3. Neuropathy Controlled on gabapentin  4. Osteoarthritis of multiple joints, unspecified osteoarthritis type -stable at this time, had improvement after injections through ortho  5. Hereditary hemochromatosis Continues to follow up with hematology, recent phlebotomy  6. Anxiety and  depression Stable on current regimen  7. Overactive bladder -stable on oxybutynin.   8. Weight loss -encouraged to liberalize diet. To have protein supplement in addition to smallest meal of the day.   Return in about 6 months (around 07/31/2023) for routine follow up.  Janene Harvey. Biagio Borg [facility] [phone]

## 2023-02-05 ENCOUNTER — Other Ambulatory Visit: Payer: Self-pay | Admitting: Nurse Practitioner

## 2023-02-05 DIAGNOSIS — I10 Essential (primary) hypertension: Secondary | ICD-10-CM

## 2023-02-18 DIAGNOSIS — H18593 Other hereditary corneal dystrophies, bilateral: Secondary | ICD-10-CM | POA: Diagnosis not present

## 2023-02-18 DIAGNOSIS — H353131 Nonexudative age-related macular degeneration, bilateral, early dry stage: Secondary | ICD-10-CM | POA: Diagnosis not present

## 2023-02-18 DIAGNOSIS — H40013 Open angle with borderline findings, low risk, bilateral: Secondary | ICD-10-CM | POA: Diagnosis not present

## 2023-03-12 ENCOUNTER — Other Ambulatory Visit: Payer: Self-pay | Admitting: Nurse Practitioner

## 2023-03-12 DIAGNOSIS — I10 Essential (primary) hypertension: Secondary | ICD-10-CM

## 2023-03-30 ENCOUNTER — Other Ambulatory Visit: Payer: Self-pay | Admitting: Nurse Practitioner

## 2023-04-30 DIAGNOSIS — M25511 Pain in right shoulder: Secondary | ICD-10-CM | POA: Diagnosis not present

## 2023-05-07 ENCOUNTER — Encounter: Payer: Self-pay | Admitting: Hematology

## 2023-05-07 ENCOUNTER — Inpatient Hospital Stay: Payer: Medicare HMO

## 2023-05-07 ENCOUNTER — Other Ambulatory Visit: Payer: Self-pay

## 2023-05-07 ENCOUNTER — Inpatient Hospital Stay (HOSPITAL_BASED_OUTPATIENT_CLINIC_OR_DEPARTMENT_OTHER): Payer: Medicare HMO | Admitting: Hematology

## 2023-05-07 LAB — CMP (CANCER CENTER ONLY)
ALT: 12 U/L (ref 0–44)
AST: 15 U/L (ref 15–41)
Albumin: 4.1 g/dL (ref 3.5–5.0)
Alkaline Phosphatase: 79 U/L (ref 38–126)
Anion gap: 7 (ref 5–15)
BUN: 28 mg/dL — ABNORMAL HIGH (ref 8–23)
CO2: 30 mmol/L (ref 22–32)
Calcium: 10 mg/dL (ref 8.9–10.3)
Chloride: 102 mmol/L (ref 98–111)
Creatinine: 0.82 mg/dL (ref 0.44–1.00)
GFR, Estimated: >60 mL/min (ref >60)
Glucose, Bld: 93 mg/dL (ref 70–99)
Potassium: 4 mmol/L (ref 3.5–5.1)
Sodium: 139 mmol/L (ref 135–145)
Total Bilirubin: 0.6 mg/dL (ref 0.3–1.2)
Total Protein: 6.5 g/dL (ref 6.5–8.1)

## 2023-05-07 LAB — IRON AND IRON BINDING CAPACITY (CC-WL,HP ONLY)
Iron: 97 ug/dL (ref 28–170)
Saturation Ratios: 36 % — ABNORMAL HIGH (ref 10.4–31.8)
TIBC: 269 ug/dL (ref 250–450)
UIBC: 172 ug/dL (ref 148–442)

## 2023-05-07 LAB — CBC WITH DIFFERENTIAL (CANCER CENTER ONLY)
Abs Immature Granulocytes: 0.02 10*3/uL (ref 0.00–0.07)
Basophils Absolute: 0 10*3/uL (ref 0.0–0.1)
Basophils Relative: 0 %
Eosinophils Absolute: 0.1 10*3/uL (ref 0.0–0.5)
Eosinophils Relative: 2 %
HCT: 35.3 % — ABNORMAL LOW (ref 36.0–46.0)
Hemoglobin: 11.8 g/dL — ABNORMAL LOW (ref 12.0–15.0)
Immature Granulocytes: 0 %
Lymphocytes Relative: 29 %
Lymphs Abs: 1.9 10*3/uL (ref 0.7–4.0)
MCH: 31.4 pg (ref 26.0–34.0)
MCHC: 33.4 g/dL (ref 30.0–36.0)
MCV: 93.9 fL (ref 80.0–100.0)
Monocytes Absolute: 0.7 10*3/uL (ref 0.1–1.0)
Monocytes Relative: 11 %
Neutro Abs: 3.8 10*3/uL (ref 1.7–7.7)
Neutrophils Relative %: 58 %
Platelet Count: 259 10*3/uL (ref 150–400)
RBC: 3.76 MIL/uL — ABNORMAL LOW (ref 3.87–5.11)
RDW: 13.3 % (ref 11.5–15.5)
WBC Count: 6.5 10*3/uL (ref 4.0–10.5)
nRBC: 0 % (ref 0.0–0.2)

## 2023-05-07 LAB — SAMPLE TO BLOOD BANK

## 2023-05-07 LAB — FERRITIN: Ferritin: 29 ng/mL (ref 11–307)

## 2023-05-07 NOTE — Assessment & Plan Note (Signed)
-  She is currently being treated with as needed phlebotomies every 4 months. She is tolerating well. Due to her advanced age, we are use loose criteria for her phlebotomy.  However she does feel better (less joint pain) after phlebotomy, and she has been tolerating phlebotomy well.   -If her ferritin >50 or transferrin saturation>50%, I will recommend phlebotomy, I'll do 0.75 unit with normal saline 500 mL infusion. Her iron study result is usually not available during her visit, and OK to do Phlebotomy based on her last lab results.  -we again discussed her severe arthralgia and hemachromatosis. Her daughter reports she seems to feel better after phlebotomy. I explained they could be related given iron deposits in joints.  -We will continue to do labs every 4 months, with phlebotomy as needed. -F/u in 12 months

## 2023-05-07 NOTE — Progress Notes (Signed)
[facility]   Telephone:(336) [phone] [phone]   Clinic Follow up Note   Patient Care Team: Sharon Seller, NP as PCP - General (Geriatric Medicine) Malachy Mood, MD as Consulting Physician (Hematology and Oncology)  Date of Service:  05/07/2023  CHIEF COMPLAINT: f/u of hereditary hemachromatosis   CURRENT THERAPY:  Phlebotomy as needed, if ferritin > 50 and transferrin sat >50% on last lab     ASSESSMENT:  Denise Lane is a 89 y.o. female with   Hereditary hemochromatosis (HCC) -She is currently being treated with as needed phlebotomies every 4 months. She is tolerating well. Due to her advanced age, we are use loose criteria for her phlebotomy.  However she does feel better (less joint pain) after phlebotomy, and she has been tolerating phlebotomy well.   -If her ferritin >50 or transferrin saturation>50%, I will recommend phlebotomy, I'll do 0.75 unit with normal saline 500 mL infusion. Her iron study result is usually not available during her visit, and OK to do Phlebotomy based on her last lab results.  -we again discussed her severe arthralgia and hemachromatosis. Her daughter reports she seems to feel better after phlebotomy. I explained they could be related given iron deposits in joints.  -We will continue to do labs every 6 months, with phlebotomy as needed. -F/u in 12 months    PLAN: - reviewed labs - no phlebotomy today - f/u in 1 year - proceed to scheduling    INTERVAL HISTORY:  Denise Lane is here for a follow up of hemachromatosis. She was last seen by me on 05/08/2022. She presents to clinic her daughter. Patient is having frequent falls.      All other systems were reviewed with the patient and are negative.  MEDICAL HISTORY:  Past Medical History:  Diagnosis Date   Basal cell cancer    "face, legs" (10/18/2012)   Bruises easily    Chronic lower back pain    Depression    takes Effexor daily   Diverticulosis of  sigmoid colon 11-20-2009   Dry eyes    uses Refresh eye drops daily as needed   Dysphagia    Gastric ulcer    GERD (gastroesophageal reflux disease)    takes Omeprazole daily   H/O hiatal hernia    Hemochromatosis 1987   Hemochromatosis    Hemorrhoids, internal 11-20-09   History of blood transfusion    "S/P colonoscopy after polyp removed; got 2 units; esophageal bleed got 1 unit; really messed up hemochromatosis" (1/7/20214)   History of bronchitis 1991-1996   "chronic; related to winter" (10/18/2012)   History of colonic polyps    NOTED 11-20-09 IN COLONOSCOPY REPORT   Hyperlipidemia    no meds required   Hypertension    takes Metoprolol and Losartan daily   Joint pain    Joint swelling    Muscle pain    Muscle spasm    takes Robaxin daily as needed   Occasional tremors    Osteoarthritis    Osteoarthritis of right hip 10/18/2012   Osteoporosis    Overactive bladder    takes Myrbetriq daily   PONV (postoperative nausea and vomiting)    Rheumatic fever    hx of   Squamous carcinoma    "LLE" (10/18/2012)   Stenosis of esophagus    Tremor, essential 10-29-09   Urinary urgency    takes Ditropan daily   Vomiting    occasionally    SURGICAL HISTORY: Past Surgical  History:  Procedure Laterality Date   APPENDECTOMY  1952   CATARACT EXTRACTION W/ INTRAOCULAR LENS  IMPLANT, BILATERAL  2001   "both eyes" (10/18/2012)   CHOLECYSTECTOMY  1977   COLONOSCOPY     DILATION AND CURETTAGE OF UTERUS  1975   ESOPHAGOGASTRODUODENOSCOPY     LUMBAR DISC SURGERY  1989   SHOULDER ARTHROSCOPY W/ ROTATOR CUFF REPAIR  1998?   "left" (10/18/2012)   SKIN CANCER EXCISION     "multiple" (10/18/2012)   TONSILLECTOMY  1953   TOTAL HIP ARTHROPLASTY  10/18/2012   "right" (10/18/2012)   TOTAL HIP ARTHROPLASTY  10/18/2012   Procedure: TOTAL HIP ARTHROPLASTY;  Surgeon: Eulas Post, MD;  Location: [facility] OR;  Service: Orthopedics;  Laterality: Right;   TOTAL HIP ARTHROPLASTY Left 07/24/2014   dr Dion Saucier   TOTAL  HIP ARTHROPLASTY Left 07/24/2014   Procedure: LEFT TOTAL HIP ARTHROPLASTY;  Surgeon: Eulas Post, MD;  Location: [facility] OR;  Service: Orthopedics;  Laterality: Left;   TUBAL LIGATION  1975    I have reviewed the social history and family history with the patient and they are unchanged from previous note.  ALLERGIES:  is allergic to codeine, dilaudid [hydromorphone hcl], macrodantin, morphine and codeine, sulfa antibiotics, and ibuprofen.  MEDICATIONS:  Current Outpatient Medications  Medication Sig Dispense Refill   acetaminophen (TYLENOL) 650 MG CR tablet Take 650 mg by mouth 2 (two) times daily.      carboxymethylcellulose (REFRESH PLUS) 0.5 % SOLN Place 1 drop into both eyes 3 (three) times daily as needed. Uses more frequently in left eye as needed.     cholecalciferol (VITAMIN D) 1000 UNITS tablet Take 1,000 Units by mouth 2 (two) times daily.     gabapentin (NEURONTIN) 300 MG capsule TAKE 1 CAPSULE EVERY DAY 90 capsule 3   Krill Oil 350 MG CAPS Take 1 capsule by mouth daily.     losartan (COZAAR) 50 MG tablet TAKE 1 TABLET EVERY DAY 90 tablet 3   metoprolol tartrate (LOPRESSOR) 25 MG tablet TAKE 1 TABLET EVERY DAY 90 tablet 3   oxybutynin (DITROPAN) 5 MG tablet TAKE 1 TABLET TWICE DAILY (SUBSTITUTED FOR DITROPAN) 180 tablet 3   pantoprazole (PROTONIX) 40 MG tablet TAKE 1 TABLET EVERY DAY 90 tablet 3   venlafaxine XR (EFFEXOR-XR) 37.5 MG 24 hr capsule TAKE 1 CAPSULE EVERY DAY WITH BREAKFAST 90 capsule 1   No current facility-administered medications for this visit.    PHYSICAL EXAMINATION: ECOG PERFORMANCE STATUS: 2 - Symptomatic, <50% confined to bed  Vitals:   05/07/23 1309  BP: (!) 143/66  Pulse: 60  Resp: 18  Temp: (!) 97.3 F (36.3 C)  SpO2: 100%   Wt Readings from Last 3 Encounters:  05/07/23 109 lb 1.6 oz (49.5 kg)  01/29/23 105 lb 3.2 oz (47.7 kg)  07/31/22 114 lb (51.7 kg)     GENERAL:alert, no distress and comfortable SKIN: skin color, texture, turgor are  normal, no rashes or significant lesions EYES: normal, Conjunctiva are pink and non-injected, sclera clear NECK: supple, thyroid normal size, non-tender, without nodularity LYMPH:  no palpable lymphadenopathy in the cervical, axillary  LUNGS: clear to auscultation and percussion with normal breathing effort HEART: regular rate & rhythm and no murmurs and no lower extremity edema ABDOMEN:abdomen soft, non-tender and normal bowel sounds Musculoskeletal:no cyanosis of digits and no clubbing  NEURO: alert & oriented x 3 with fluent speech, no focal motor/sensory deficits  LABORATORY DATA:  I have reviewed the data as  listed    Latest Ref Rng & Units 05/07/2023   12:30 PM 01/01/2023   11:42 AM 08/28/2022    1:00 PM  CBC  WBC 4.0 - 10.5 K/uL 6.5  7.3  8.0   Hemoglobin 12.0 - 15.0 g/dL 13.0  86.5  78.4   Hematocrit 36.0 - 46.0 % 35.3  38.3  36.1   Platelets 150 - 400 K/uL 259  263  245         Latest Ref Rng & Units 05/07/2023   12:30 PM 01/01/2023   11:42 AM 08/28/2022    1:00 PM  CMP  Glucose 70 - 99 mg/dL 93  696  295   BUN 8 - 23 mg/dL 28  22  22    Creatinine 0.44 - 1.00 mg/dL 2.84  1.32  4.40   Sodium 135 - 145 mmol/L 139  142  139   Potassium 3.5 - 5.1 mmol/L 4.0  3.9  3.7   Chloride 98 - 111 mmol/L 102  103  102   CO2 22 - 32 mmol/L 30  34  32   Calcium 8.9 - 10.3 mg/dL 10.2  9.9  72.5   Total Protein 6.5 - 8.1 g/dL 6.5  7.0  7.1   Total Bilirubin 0.3 - 1.2 mg/dL 0.6  0.6  0.8   Alkaline Phos 38 - 126 U/L 79  99  73   AST 15 - 41 U/L 15  18  18    ALT 0 - 44 U/L 12  12  10        RADIOGRAPHIC STUDIES: I have personally reviewed the radiological images as listed and agreed with the findings in the report. No results found.    No orders of the defined types were placed in this encounter.  All questions were answered. The patient knows to call the clinic with any problems, questions or concerns. No barriers to learning was detected. The total time spent in the  appointment was 15 minutes.     Malachy Mood, MD 05/07/2023   I, Sharlette Dense, CMA, am acting as scribe for Malachy Mood, MD.   I have reviewed the above documentation for accuracy and completeness, and I agree with the above.

## 2023-05-10 ENCOUNTER — Encounter: Payer: Self-pay | Admitting: Hematology

## 2023-06-14 ENCOUNTER — Other Ambulatory Visit: Payer: Self-pay | Admitting: Nurse Practitioner

## 2023-06-14 DIAGNOSIS — F419 Anxiety disorder, unspecified: Secondary | ICD-10-CM

## 2023-07-05 DIAGNOSIS — M542 Cervicalgia: Secondary | ICD-10-CM | POA: Diagnosis not present

## 2023-07-05 DIAGNOSIS — S46012A Strain of muscle(s) and tendon(s) of the rotator cuff of left shoulder, initial encounter: Secondary | ICD-10-CM | POA: Diagnosis not present

## 2023-07-09 ENCOUNTER — Encounter: Payer: Self-pay | Admitting: Nurse Practitioner

## 2023-07-15 ENCOUNTER — Other Ambulatory Visit: Payer: Self-pay | Admitting: Nurse Practitioner

## 2023-07-15 DIAGNOSIS — K219 Gastro-esophageal reflux disease without esophagitis: Secondary | ICD-10-CM

## 2023-07-26 ENCOUNTER — Encounter: Payer: Self-pay | Admitting: Nurse Practitioner

## 2023-07-26 ENCOUNTER — Ambulatory Visit (INDEPENDENT_AMBULATORY_CARE_PROVIDER_SITE_OTHER): Payer: Medicare HMO | Admitting: Nurse Practitioner

## 2023-07-26 VITALS — BP 120/70 | HR 95 | Temp 98.0°F | Resp 18 | Ht 63.0 in | Wt 110.4 lb

## 2023-07-26 DIAGNOSIS — F32A Depression, unspecified: Secondary | ICD-10-CM

## 2023-07-26 DIAGNOSIS — N3281 Overactive bladder: Secondary | ICD-10-CM | POA: Diagnosis not present

## 2023-07-26 DIAGNOSIS — R413 Other amnesia: Secondary | ICD-10-CM

## 2023-07-26 DIAGNOSIS — R634 Abnormal weight loss: Secondary | ICD-10-CM

## 2023-07-26 DIAGNOSIS — K219 Gastro-esophageal reflux disease without esophagitis: Secondary | ICD-10-CM

## 2023-07-26 DIAGNOSIS — F419 Anxiety disorder, unspecified: Secondary | ICD-10-CM

## 2023-07-26 DIAGNOSIS — Z23 Encounter for immunization: Secondary | ICD-10-CM

## 2023-07-26 DIAGNOSIS — G629 Polyneuropathy, unspecified: Secondary | ICD-10-CM | POA: Diagnosis not present

## 2023-07-26 DIAGNOSIS — M159 Polyosteoarthritis, unspecified: Secondary | ICD-10-CM | POA: Diagnosis not present

## 2023-07-26 DIAGNOSIS — I110 Hypertensive heart disease with heart failure: Secondary | ICD-10-CM | POA: Diagnosis not present

## 2023-07-26 DIAGNOSIS — R296 Repeated falls: Secondary | ICD-10-CM

## 2023-07-26 NOTE — Progress Notes (Signed)
Careteam: Patient Care Team: Sharon Seller, NP as PCP - General (Geriatric Medicine) Malachy Mood, MD as Consulting Physician (Hematology and Oncology)  PLACE OF SERVICE:  [facility]  Advanced Directive information Does Patient Have a Medical Advance Directive?: Yes, Type of Advance Directive: Healthcare Power of [place];Living will, Does patient want to make changes to medical advance directive?: No - Patient declined  Allergies  Allergen Reactions   Codeine Nausea And Vomiting   Dilaudid [Hydromorphone Hcl] Hives and Other (See Comments)    "whelps" (10/18/2012)   Macrodantin Hives and Other (See Comments)    "drug fever; chills; felt terrible" (10/18/2012)   Morphine And Codeine Hives and Nausea And Vomiting   Sulfa Antibiotics Hives, Rash and Other (See Comments)    "got real sick" (10/18/2012)   Ibuprofen Other (See Comments)    "felt like I have to go to the bathroom constantly"    Chief Complaint  Patient presents with   Medical Management of Chronic Issues    6 month follow-up. Discuss need for flu vaccine, td/tdap, covid booster and AWV.    HPI: Patient is a 89 y.o. female presents for 10-month follow-up.  OA ongoing, she has chronic pain, taking tylenol and advil BID per ortho, has a follow up with them next week. Taking medication with food to avoid GI side effects.  Gabapentin for neuropathy, controlled.  Recently saw oncology for hereditary hemochromatosis. Blood work reviewed. Next appointment in January.   Lives alone, no homecare services because she can't afford it; doesn't want to go to a nursing facility.  Reports frequent falls, balance is off, "walks backwards some", shuffles, and tremors have gotten worse, has walker. Not eating much, tries to eat 2 meals per day. Drinking Premier protein. Taking B12. Daughter visits often and checks in daily.   Had to put her cat down in June, she was very upset, otherwise mood stable. Some days having more memory  issues, no longer driving, daughter assisting with medications.   Overactive, continuous issues, having incontinence.  Review of Systems:  Review of Systems  Constitutional:  Negative for chills, fever and weight loss.  Respiratory:  Positive for cough (in the mornings). Negative for shortness of breath.   Cardiovascular:  Negative for chest pain, palpitations and leg swelling.  Gastrointestinal:  Negative for abdominal pain, constipation, diarrhea, nausea and vomiting.  Genitourinary:  Positive for frequency and urgency. Negative for dysuria.  Musculoskeletal:  Positive for joint pain and myalgias.  Neurological:  Negative for dizziness and headaches.  Psychiatric/Behavioral:  Negative for depression. The patient is not nervous/anxious and does not have insomnia.     Past Medical History:  Diagnosis Date   Basal cell cancer    "face, legs" (10/18/2012)   Bruises easily    Chronic lower back pain    Depression    takes Effexor daily   Diverticulosis of sigmoid colon 11-20-2009   Dry eyes    uses Refresh eye drops daily as needed   Dysphagia    Gastric ulcer    GERD (gastroesophageal reflux disease)    takes Omeprazole daily   H/O hiatal hernia    Hemochromatosis 1987   Hemochromatosis    Hemorrhoids, internal 11-20-09   History of blood transfusion    "S/P colonoscopy after polyp removed; got 2 units; esophageal bleed got 1 unit; really messed up hemochromatosis" (1/7/20214)   History of bronchitis 1991-1996   "chronic; related to winter" (10/18/2012)   History of colonic polyps  NOTED 11-20-09 IN COLONOSCOPY REPORT   Hyperlipidemia    no meds required   Hypertension    takes Metoprolol and Losartan daily   Joint pain    Joint swelling    Muscle pain    Muscle spasm    takes Robaxin daily as needed   Occasional tremors    Osteoarthritis    Osteoarthritis of right hip 10/18/2012   Osteoporosis    Overactive bladder    takes Myrbetriq daily   PONV (postoperative nausea and  vomiting)    Rheumatic fever    hx of   Squamous carcinoma    "LLE" (10/18/2012)   Stenosis of esophagus    Tremor, essential 10-29-09   Urinary urgency    takes Ditropan daily   Vomiting    occasionally   Past Surgical History:  Procedure Laterality Date   APPENDECTOMY  1952   CATARACT EXTRACTION W/ INTRAOCULAR LENS  IMPLANT, BILATERAL  2001   "both eyes" (10/18/2012)   CHOLECYSTECTOMY  1977   COLONOSCOPY     DILATION AND CURETTAGE OF UTERUS  1975   ESOPHAGOGASTRODUODENOSCOPY     LUMBAR DISC SURGERY  1989   SHOULDER ARTHROSCOPY W/ ROTATOR CUFF REPAIR  1998?   "left" (10/18/2012)   SKIN CANCER EXCISION     "multiple" (10/18/2012)   TONSILLECTOMY  1953   TOTAL HIP ARTHROPLASTY  10/18/2012   "right" (10/18/2012)   TOTAL HIP ARTHROPLASTY  10/18/2012   Procedure: TOTAL HIP ARTHROPLASTY;  Surgeon: Eulas Post, MD;  Location: [facility] OR;  Service: Orthopedics;  Laterality: Right;   TOTAL HIP ARTHROPLASTY Left 07/24/2014   dr Dion Saucier   TOTAL HIP ARTHROPLASTY Left 07/24/2014   Procedure: LEFT TOTAL HIP ARTHROPLASTY;  Surgeon: Eulas Post, MD;  Location: [facility] OR;  Service: Orthopedics;  Laterality: Left;   TUBAL LIGATION  1975   Social History:   reports that she has never smoked. She has never used smokeless tobacco. She reports that she does not drink alcohol and does not use drugs.  Family History  Problem Relation Age of Onset   Heart disease Mother    Heart disease Father    Hyperlipidemia Daughter    Heart disease Brother    Heart disease Brother    Heart disease Brother    Heart disease Brother    Cancer Sister    Melanoma Sister    Alzheimer's disease Sister    Heart disease Sister    Osteoporosis Sister     Medications: Patient's Medications  New Prescriptions   No medications on file  Previous Medications   ACETAMINOPHEN (TYLENOL) 650 MG CR TABLET    Take 650 mg by mouth 2 (two) times daily.    CARBOXYMETHYLCELLULOSE (REFRESH PLUS) 0.5 % SOLN    Place 1 drop into both  eyes 3 (three) times daily as needed. Uses more frequently in left eye as needed.   CHOLECALCIFEROL (VITAMIN D) 1000 UNITS TABLET    Take 1,000 Units by mouth 2 (two) times daily.   GABAPENTIN (NEURONTIN) 300 MG CAPSULE    TAKE 1 CAPSULE EVERY DAY   IBUPROFEN (ADVIL) 200 MG TABLET    Take 200 mg by mouth in the morning and at bedtime.   KRILL OIL 350 MG CAPS    Take 1 capsule by mouth daily.   LOSARTAN (COZAAR) 50 MG TABLET    TAKE 1 TABLET EVERY DAY   METOPROLOL TARTRATE (LOPRESSOR) 25 MG TABLET    TAKE 1 TABLET EVERY DAY  OXYBUTYNIN (DITROPAN) 5 MG TABLET    TAKE 1 TABLET TWICE DAILY (SUBSTITUTED FOR DITROPAN)   PANTOPRAZOLE (PROTONIX) 40 MG TABLET    TAKE 1 TABLET EVERY DAY   VENLAFAXINE XR (EFFEXOR-XR) 37.5 MG 24 HR CAPSULE    TAKE 1 CAPSULE EVERY DAY WITH BREAKFAST  Modified Medications   No medications on file  Discontinued Medications   No medications on file    Physical Exam:  Vitals:   07/26/23 1147  BP: 120/70  Pulse: 95  Resp: 18  Temp: 98 F (36.7 C)  SpO2: 97%  Weight: 50.1 kg  Height: 5\' 3"  (1.6 m)   Body mass index is 19.56 kg/m. Wt Readings from Last 3 Encounters:  07/26/23 110 lb 6.4 oz (50.1 kg)  05/07/23 109 lb 1.6 oz (49.5 kg)  01/29/23 105 lb 3.2 oz (47.7 kg)    Physical Exam Constitutional:      General: She is not in acute distress.    Appearance: Normal appearance.  Cardiovascular:     Rate and Rhythm: Normal rate and regular rhythm.     Pulses: Normal pulses.     Heart sounds: Normal heart sounds.  Pulmonary:     Effort: Pulmonary effort is normal.     Breath sounds: Normal breath sounds.  Abdominal:     General: Bowel sounds are normal.     Palpations: Abdomen is soft.  Musculoskeletal:     Right lower leg: No edema.     Left lower leg: No edema.  Skin:    General: Skin is warm and dry.  Neurological:     General: No focal deficit present.     Mental Status: She is alert. Mental status is at baseline.  Psychiatric:        Mood and  Affect: Mood normal.        Behavior: Behavior normal.     Labs reviewed: Basic Metabolic Panel: Recent Labs    08/28/22 1300 01/01/23 1142 05/07/23 1230  NA 139 142 139  K 3.7 3.9 4.0  CL 102 103 102  CO2 32 34* 30  GLUCOSE 104* 100* 93  BUN 22 22 28*  CREATININE 0.62 0.69 0.82  CALCIUM 10.0 9.9 10.0   Liver Function Tests: Recent Labs    08/28/22 1300 01/01/23 1142 05/07/23 1230  AST 18 18 15   ALT 10 12 12   ALKPHOS 73 99 79  BILITOT 0.8 0.6 0.6  PROT 7.1 7.0 6.5  ALBUMIN 4.4 4.3 4.1   No results for input(s): "LIPASE", "AMYLASE" in the last 8760 hours. No results for input(s): "AMMONIA" in the last 8760 hours. CBC: Recent Labs    08/28/22 1300 01/01/23 1142 05/07/23 1230  WBC 8.0 7.3 6.5  NEUTROABS 5.3 4.8 3.8  HGB 12.3 12.7 11.8*  HCT 36.1 38.3 35.3*  MCV 98.6 96.0 93.9  PLT 245 263 259   Lipid Panel: No results for input(s): "CHOL", "HDL", "LDLCALC", "TRIG", "CHOLHDL", "LDLDIRECT" in the last 8760 hours. TSH: No results for input(s): "TSH" in the last 8760 hours. A1C: No results found for: "HGBA1C"  Assessment/Plan 1. Essential hypertension -Controlled, at goal, <140/90 -Continue metoprolol and losartan -Encouraged dietary modifications/DASH diet and physical activity as tolerated  2. Gastroesophageal reflux disease without esophagitis -Stable, on Protonix -Encouraged dietary modifications to prevent symptoms  3. Osteoarthritis of multiple joints, unspecified osteoarthritis type -ongoing, continue alternating Tylenol/Advil -Take Advil with food, ensure adequate hydration -Continue care with ortho for injections.   4. Neuropathy -Controlled, continue gabapentin  5.  Hereditary hemochromatosis (HCC) -Stable -Continues care with hematology, next f/u in a year  6. Anxiety and depression -Stable, continue venlafaxine XR  7. Weight loss -Weight stable, Encouraged high calorie/protein meals  8. Overactive bladder -Ongoing, Continue  oxybutin  9. Need for influenza vaccination - Flu Vaccine Trivalent High Dose (Fluad)  10. Memory loss -noted by daughter, continues to function well on the day to day independently but does not drive and daughter does her finances.  11.  Frequent falls Discussed fall prevention. She had DME to help when able.   Return in about 6 months (around 01/24/2024) for routine follow up .  Rollen Sox, FNP-MSN Student -I personally was present during the history, physical exam and medical decision-making activities of this service and have verified that the service and findings are accurately documented in the student's note Abbey Chatters, NP

## 2023-08-02 DIAGNOSIS — M25511 Pain in right shoulder: Secondary | ICD-10-CM | POA: Diagnosis not present

## 2023-08-09 ENCOUNTER — Encounter: Payer: Self-pay | Admitting: Nurse Practitioner

## 2023-08-09 NOTE — Telephone Encounter (Signed)
Please place 4 weeks of samples of gemtesa 75 mg by mouth daily at front

## 2023-08-24 ENCOUNTER — Encounter: Payer: Self-pay | Admitting: Nurse Practitioner

## 2023-10-20 ENCOUNTER — Telehealth: Payer: Self-pay | Admitting: Pain Medicine

## 2023-10-20 NOTE — Telephone Encounter (Signed)
 Patient daughter called to reschedule appt for earlier time. Appt adjusted.

## 2023-11-04 ENCOUNTER — Inpatient Hospital Stay: Payer: Medicare HMO

## 2023-11-22 ENCOUNTER — Inpatient Hospital Stay: Payer: Medicare HMO | Attending: Hematology

## 2023-11-22 ENCOUNTER — Encounter: Payer: Self-pay | Admitting: Nurse Practitioner

## 2023-11-22 ENCOUNTER — Inpatient Hospital Stay: Payer: Medicare HMO

## 2023-11-22 DIAGNOSIS — M159 Polyosteoarthritis, unspecified: Secondary | ICD-10-CM

## 2023-11-22 DIAGNOSIS — R296 Repeated falls: Secondary | ICD-10-CM

## 2023-11-22 DIAGNOSIS — R634 Abnormal weight loss: Secondary | ICD-10-CM

## 2023-11-22 DIAGNOSIS — R413 Other amnesia: Secondary | ICD-10-CM

## 2023-11-22 LAB — IRON AND IRON BINDING CAPACITY (CC-WL,HP ONLY)
Iron: 82 ug/dL (ref 28–170)
Saturation Ratios: 29 % (ref 10.4–31.8)
TIBC: 287 ug/dL (ref 250–450)
UIBC: 205 ug/dL (ref 148–442)

## 2023-11-22 LAB — CBC WITH DIFFERENTIAL (CANCER CENTER ONLY)
Abs Immature Granulocytes: 0.03 10*3/uL (ref 0.00–0.07)
Basophils Absolute: 0 10*3/uL (ref 0.0–0.1)
Basophils Relative: 0 %
Eosinophils Absolute: 0.1 10*3/uL (ref 0.0–0.5)
Eosinophils Relative: 1 %
HCT: 39 % (ref 36.0–46.0)
Hemoglobin: 12.9 g/dL (ref 12.0–15.0)
Immature Granulocytes: 0 %
Lymphocytes Relative: 15 %
Lymphs Abs: 1.4 10*3/uL (ref 0.7–4.0)
MCH: 33.2 pg (ref 26.0–34.0)
MCHC: 33.1 g/dL (ref 30.0–36.0)
MCV: 100.5 fL — ABNORMAL HIGH (ref 80.0–100.0)
Monocytes Absolute: 0.9 10*3/uL (ref 0.1–1.0)
Monocytes Relative: 10 %
Neutro Abs: 6.7 10*3/uL (ref 1.7–7.7)
Neutrophils Relative %: 74 %
Platelet Count: 266 10*3/uL (ref 150–400)
RBC: 3.88 MIL/uL (ref 3.87–5.11)
RDW: 11.9 % (ref 11.5–15.5)
WBC Count: 9 10*3/uL (ref 4.0–10.5)
nRBC: 0 % (ref 0.0–0.2)

## 2023-11-22 LAB — CMP (CANCER CENTER ONLY)
ALT: 13 U/L (ref 0–44)
AST: 18 U/L (ref 15–41)
Albumin: 4.4 g/dL (ref 3.5–5.0)
Alkaline Phosphatase: 83 U/L (ref 38–126)
Anion gap: 5 (ref 5–15)
BUN: 17 mg/dL (ref 8–23)
CO2: 33 mmol/L — ABNORMAL HIGH (ref 22–32)
Calcium: 10.3 mg/dL (ref 8.9–10.3)
Chloride: 102 mmol/L (ref 98–111)
Creatinine: 0.68 mg/dL (ref 0.44–1.00)
GFR, Estimated: >60 mL/min (ref >60)
Glucose, Bld: 112 mg/dL — ABNORMAL HIGH (ref 70–99)
Potassium: 3.8 mmol/L (ref 3.5–5.1)
Sodium: 140 mmol/L (ref 135–145)
Total Bilirubin: 0.7 mg/dL (ref 0.0–1.2)
Total Protein: 7 g/dL (ref 6.5–8.1)

## 2023-11-22 LAB — FERRITIN: Ferritin: 45 ng/mL (ref 11–307)

## 2023-11-24 ENCOUNTER — Encounter: Payer: Self-pay | Admitting: Nurse Practitioner

## 2023-11-25 ENCOUNTER — Encounter: Payer: Self-pay | Admitting: Nurse Practitioner

## 2023-11-25 ENCOUNTER — Telehealth: Payer: Self-pay

## 2023-11-25 NOTE — Telephone Encounter (Signed)
Sherri a referral intake specialist is calling because she has received an palliative care referral and that [facility] will be following this.Phone number is [phone] if there are any concerns.

## 2023-11-25 NOTE — Telephone Encounter (Signed)
So hospice is consulted?

## 2023-11-26 DIAGNOSIS — M25552 Pain in left hip: Secondary | ICD-10-CM | POA: Diagnosis not present

## 2023-11-26 DIAGNOSIS — M545 Low back pain, unspecified: Secondary | ICD-10-CM | POA: Diagnosis not present

## 2023-11-26 DIAGNOSIS — R0781 Pleurodynia: Secondary | ICD-10-CM | POA: Diagnosis not present

## 2023-12-01 ENCOUNTER — Ambulatory Visit: Payer: Self-pay | Admitting: *Deleted

## 2023-12-01 ENCOUNTER — Telehealth: Payer: Self-pay | Admitting: *Deleted

## 2023-12-01 ENCOUNTER — Other Ambulatory Visit: Payer: Self-pay | Admitting: Nurse Practitioner

## 2023-12-01 ENCOUNTER — Encounter: Payer: Self-pay | Admitting: Nurse Practitioner

## 2023-12-01 DIAGNOSIS — I10 Essential (primary) hypertension: Secondary | ICD-10-CM

## 2023-12-01 DIAGNOSIS — R413 Other amnesia: Secondary | ICD-10-CM

## 2023-12-01 DIAGNOSIS — R296 Repeated falls: Secondary | ICD-10-CM

## 2023-12-01 NOTE — Patient Instructions (Signed)
Visit Information  Thank you for taking time to visit with me today. Please don't hesitate to contact me if I can be of assistance to you.   Following are the goals we discussed today:   Goals Addressed             This Visit's Progress    Care Coordination activities        Keep all upcoming appointment discussed today Continue with compliance of taking medication prescribed by Doctor Please contact your provider with health questions/concerns or if your condition  worsens Please schedule follow up appointment with PCP  Self Support options  (continue to consider obtaining a med alert system for safety and out of home placement if needed in the future)          If you are experiencing a Mental Health or Behavioral Health Crisis or need someone to talk to, please call 911   Patient verbalizes understanding of instructions and care plan provided today and agrees to view in MyChart. Active MyChart status and patient understanding of how to access instructions and care plan via MyChart confirmed with patient.     No further follow up required: patient's daughter to contact this Child psychotherapist with any additional community resource needs.  Cordera Stineman, LCSW [place]  [facility], [facility] Licensed Clinical Social Worker Care Coordinator  Direct Dial: [phone]

## 2023-12-01 NOTE — Patient Outreach (Signed)
  Care Coordination   Initial Visit Note   12/01/2023 Name: Denise Lane MRN: [id number] DOB: 1930/01/27  Denise Lane is a 89 y.o. year old female who sees Eubanks, Janene Harvey, NP for primary care. I spoke with  Sylvester Harder daughter Denise Lane by phone today.  What matters to the patients health and wellness today?  Patient's daughter confirms assisting patient with all care needs. Daughter recognizes that patient is a high fall risk and is considering out of home placement. Placement process discussed and well as Medicaid eligibility. Patient's daughter to continue to assist with patient's care ie transportation to appointments, grocery shopping, medication management and meals. Daughter will continue to consider out of home placement    Goals Addressed             This Visit's Progress    Care Coordination activities        Keep all upcoming appointment discussed today Continue with compliance of taking medication prescribed by Doctor Please contact your provider with health questions/concerns or if your condition  worsens Please schedule follow up appointment with PCP  Self Support options  (continue to consider obtaining a med alert system for safety and out of home placement if needed in the future)         SDOH assessments and interventions completed:  Yes  SDOH Interventions Today    Flowsheet Row Most Recent Value  SDOH Interventions   Food Insecurity Interventions Intervention Not Indicated  Housing Interventions Intervention Not Indicated  Transportation Interventions Intervention Not Indicated  Utilities Interventions Intervention Not Indicated        Care Coordination Interventions:  Yes, provided  Interventions Today    Flowsheet Row Most Recent Value  Chronic Disease   Chronic disease during today's visit Other  [frequent falls, memory loss]  General Interventions   General Interventions Discussed/Reviewed General Interventions  Discussed, Level of Care, Doctor Visits  [Patient assessed for community resource needs-frequent falls, last fall 2/10 seen by Hematologist-several compression fractures in the thoracic area-chronic pain-received cortisione shot-only lasted 24 hours-now in increased pain]  Doctor Visits Discussed/Reviewed Doctor Visits Discussed, PCP  [last visit 07/26/23 follow up encouraged with PCP]  Level of Care Skilled Nursing Facility, Personal Care Services  [discussed options for care-including facility care daughter confirms assisting with all of patient's care needs-declines resources for private duty care]  Education Interventions   Education Provided Provided Education  Provided Verbal Education On Other  [placement process for facility care discussed as well as medicaid eligibility]  Safety Interventions   Safety Discussed/Reviewed Safety Discussed  [discussed med alert through [employer] for safety-information sent to daughter by email]  Advanced Directive Interventions   Advanced Directives Discussed/Reviewed Advanced Directives Discussed  [will follow up with PCP about making patient a DNR]       Follow up plan: No further intervention required.   Encounter Outcome:  Patient Visit Completed

## 2023-12-01 NOTE — Progress Notes (Addendum)
Complex Care Management Note  Care Guide Note 12/01/2023 Name: OLUWAKEMI SALSBERRY MRN: [id number] DOB: [birth year]  Geoffery Lyons Perryman is a 89 y.o. year old female who sees Eubanks, Janene Harvey, NP for primary care. I reached out to [employer] by phone today to offer complex care management services.  Ms. Pankonin daughter Stacie Glaze was given information about Complex Care Management services today including:   The Complex Care Management services include support from the care team which includes your Nurse Care Manager, Clinical Social Worker, or Pharmacist.  The Complex Care Management team is here to help remove barriers to the health concerns and goals most important to you. Complex Care Management services are voluntary, and the patient may decline or stop services at any time by request to their care team member.   Complex Care Management Consent Status: Patient daughter Stacie Glaze agreed to services and verbal consent obtained.   Follow up plan:  Telephone appointment with complex care management team member scheduled for:  2/19  Encounter Outcome:  Patient Scheduled  Gwenevere Ghazi  [facility]  [facility], [facility] Guide  Direct Dial: [phone]  Fax [phone]

## 2024-01-04 ENCOUNTER — Other Ambulatory Visit: Payer: Self-pay | Admitting: Nurse Practitioner

## 2024-01-04 DIAGNOSIS — I10 Essential (primary) hypertension: Secondary | ICD-10-CM

## 2024-01-24 ENCOUNTER — Encounter: Payer: Self-pay | Admitting: Nurse Practitioner

## 2024-01-24 ENCOUNTER — Ambulatory Visit (INDEPENDENT_AMBULATORY_CARE_PROVIDER_SITE_OTHER): Payer: Medicare HMO | Admitting: Nurse Practitioner

## 2024-01-24 VITALS — BP 128/72 | HR 81 | Temp 99.0°F | Ht 63.0 in | Wt 105.6 lb

## 2024-01-24 DIAGNOSIS — M25552 Pain in left hip: Secondary | ICD-10-CM

## 2024-01-24 DIAGNOSIS — Z66 Do not resuscitate: Secondary | ICD-10-CM | POA: Diagnosis not present

## 2024-01-24 DIAGNOSIS — R29818 Other symptoms and signs involving the nervous system: Secondary | ICD-10-CM | POA: Diagnosis not present

## 2024-01-24 DIAGNOSIS — G629 Polyneuropathy, unspecified: Secondary | ICD-10-CM

## 2024-01-24 DIAGNOSIS — M159 Polyosteoarthritis, unspecified: Secondary | ICD-10-CM | POA: Diagnosis not present

## 2024-01-24 DIAGNOSIS — N393 Stress incontinence (female) (male): Secondary | ICD-10-CM

## 2024-01-24 DIAGNOSIS — R296 Repeated falls: Secondary | ICD-10-CM | POA: Diagnosis not present

## 2024-01-24 MED ORDER — HYDROCODONE-ACETAMINOPHEN 5-325 MG PO TABS: 0.5000 | ORAL_TABLET | Freq: Two times a day (BID) | ORAL | 0 refills | Status: DC | PRN
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
## 2024-04-03 DIAGNOSIS — M545 Low back pain, unspecified: Secondary | ICD-10-CM | POA: Diagnosis not present

## 2024-04-23 ENCOUNTER — Observation Stay (HOSPITAL_COMMUNITY)
Admission: EM | Admit: 2024-04-23 | Discharge: 2024-04-25 | Disposition: A | Discharge status: Home / Self Care | Attending: Student | Admitting: Student

## 2024-04-23 ENCOUNTER — Encounter (HOSPITAL_COMMUNITY): Payer: Self-pay

## 2024-04-23 ENCOUNTER — Other Ambulatory Visit: Payer: Self-pay

## 2024-04-23 ENCOUNTER — Emergency Department (HOSPITAL_COMMUNITY)

## 2024-04-23 DIAGNOSIS — Z96643 Presence of artificial hip joint, bilateral: Secondary | ICD-10-CM | POA: Insufficient documentation

## 2024-04-23 DIAGNOSIS — I10 Essential (primary) hypertension: Secondary | ICD-10-CM | POA: Diagnosis not present

## 2024-04-23 DIAGNOSIS — M25552 Pain in left hip: Principal | ICD-10-CM

## 2024-04-23 DIAGNOSIS — K8689 Other specified diseases of pancreas: Secondary | ICD-10-CM | POA: Diagnosis not present

## 2024-04-23 DIAGNOSIS — K5730 Diverticulosis of large intestine without perforation or abscess without bleeding: Secondary | ICD-10-CM | POA: Diagnosis not present

## 2024-04-23 DIAGNOSIS — M85852 Other specified disorders of bone density and structure, left thigh: Secondary | ICD-10-CM | POA: Diagnosis not present

## 2024-04-23 DIAGNOSIS — K219 Gastro-esophageal reflux disease without esophagitis: Secondary | ICD-10-CM | POA: Diagnosis not present

## 2024-04-23 DIAGNOSIS — F419 Anxiety disorder, unspecified: Secondary | ICD-10-CM | POA: Diagnosis not present

## 2024-04-23 DIAGNOSIS — N390 Urinary tract infection, site not specified: Principal | ICD-10-CM | POA: Diagnosis present

## 2024-04-23 DIAGNOSIS — G629 Polyneuropathy, unspecified: Secondary | ICD-10-CM | POA: Insufficient documentation

## 2024-04-23 DIAGNOSIS — M1990 Unspecified osteoarthritis, unspecified site: Secondary | ICD-10-CM | POA: Diagnosis not present

## 2024-04-23 DIAGNOSIS — R1032 Left lower quadrant pain: Secondary | ICD-10-CM

## 2024-04-23 DIAGNOSIS — N200 Calculus of kidney: Secondary | ICD-10-CM | POA: Insufficient documentation

## 2024-04-23 DIAGNOSIS — F32A Depression, unspecified: Secondary | ICD-10-CM | POA: Diagnosis present

## 2024-04-23 DIAGNOSIS — N23 Unspecified renal colic: Secondary | ICD-10-CM

## 2024-04-23 DIAGNOSIS — R935 Abnormal findings on diagnostic imaging of other abdominal regions, including retroperitoneum: Secondary | ICD-10-CM | POA: Diagnosis not present

## 2024-04-23 LAB — CBC WITH DIFFERENTIAL/PLATELET
Abs Immature Granulocytes: 0.06 K/uL (ref 0.00–0.07)
Basophils Absolute: 0 K/uL (ref 0.0–0.1)
Basophils Relative: 0 %
Eosinophils Absolute: 0 K/uL (ref 0.0–0.5)
Eosinophils Relative: 0 %
HCT: 35.9 % — ABNORMAL LOW (ref 36.0–46.0)
Hemoglobin: 12.2 g/dL (ref 12.0–15.0)
Immature Granulocytes: 1 %
Lymphocytes Relative: 6 %
Lymphs Abs: 0.6 K/uL — ABNORMAL LOW (ref 0.7–4.0)
MCH: 34.4 pg — ABNORMAL HIGH (ref 26.0–34.0)
MCHC: 34 g/dL (ref 30.0–36.0)
MCV: 101.1 fL — ABNORMAL HIGH (ref 80.0–100.0)
Monocytes Absolute: 0.1 K/uL (ref 0.1–1.0)
Monocytes Relative: 1 %
Neutro Abs: 9.8 K/uL — ABNORMAL HIGH (ref 1.7–7.7)
Neutrophils Relative %: 92 %
Platelets: 233 K/uL (ref 150–400)
RBC: 3.55 MIL/uL — ABNORMAL LOW (ref 3.87–5.11)
RDW: 11.7 % (ref 11.5–15.5)
WBC: 10.5 K/uL (ref 4.0–10.5)
nRBC: 0 % (ref 0.0–0.2)

## 2024-04-23 LAB — BASIC METABOLIC PANEL WITH GFR
Anion gap: 12 (ref 5–15)
BUN: 23 mg/dL (ref 8–23)
CO2: 25 mmol/L (ref 22–32)
Calcium: 9.5 mg/dL (ref 8.9–10.3)
Chloride: 99 mmol/L (ref 98–111)
Creatinine, Ser: 0.67 mg/dL (ref 0.44–1.00)
GFR, Estimated: >60 mL/min (ref >60)
Glucose, Bld: 132 mg/dL — ABNORMAL HIGH (ref 70–99)
Potassium: 3.7 mmol/L (ref 3.5–5.1)
Sodium: 136 mmol/L (ref 135–145)

## 2024-04-23 LAB — TYPE AND SCREEN
ABO/RH(D): O POS
Antibody Screen: NEGATIVE

## 2024-04-23 LAB — PROTIME-INR
INR: 1 (ref 0.8–1.2)
Prothrombin Time: 14 s (ref 11.4–15.2)

## 2024-04-23 MED ORDER — DICLOFENAC EPOLAMINE 1.3 % EX PTCH: 1.0000 | MEDICATED_PATCH | Freq: Two times a day (BID) | CUTANEOUS | 1 refills | Status: DC

## 2024-04-23 MED ORDER — ACETAMINOPHEN 500 MG PO TABS
1000.0000 mg | ORAL_TABLET | Freq: Once | ORAL | Status: AC
  Administered 2024-04-23: 1000 mg via ORAL
  Filled 2024-04-23: qty 2

## 2024-04-23 MED ORDER — DICLOFENAC EPOLAMINE 1.3 % EX PTCH
1.0000 | MEDICATED_PATCH | Freq: Two times a day (BID) | CUTANEOUS | Status: DC
  Administered 2024-04-23 – 2024-04-25 (×3): 1 via TRANSDERMAL
  Filled 2024-04-23 (×6): qty 1

## 2024-04-23 NOTE — Discharge Instructions (Signed)
 As discussed, today's evaluation has been reassuring.  Your hip x-rays and labs did not demonstrate evidence for fracturing or other acute abnormalities.  Please continue using Tylenol , up to 3 times daily as well as the prescribed medicated patches for relief.  Return here for concerning changes in your condition.

## 2024-04-23 NOTE — ED Provider Notes (Addendum)
 [place] EMERGENCY DEPARTMENT AT [facility] Provider Note   CSN: [id number] Arrival date & time: 04/23/24  2037     Patient presents with: Hip Pain   Denise Lane is a 89 y.o. female.   HPI Patient presents with left hip pain.  She denies fall, states that since lunch, about 10 hours ago she began having left hip pain, no obvious precipitant, she has been able to move the hip, but has severe pain.  No other injuries.  EMS reports no hemodynamic instability.     Prior to Admission medications   Medication Sig Start Date End Date Taking? Authorizing Provider  acetaminophen  (TYLENOL ) 650 MG CR tablet Take 1,300 mg by mouth 2 (two) times daily.   Yes [provider]  carboxymethylcellulose (REFRESH PLUS) 0.5 % SOLN Place 1 drop into both eyes 3 (three) times daily as needed. Uses more frequently in left eye as needed.    [provider]  cholecalciferol  (VITAMIN D) 1000 UNITS tablet Take 1,000 Units by mouth 2 (two) times daily. 10/17/12   [provider]  diclofenac  (FLECTOR ) 1.3 % PTCH Place 1 patch onto the skin 2 (two) times daily. 04/23/24  Yes Garrick Charleston, MD  gabapentin  (NEURONTIN ) 300 MG capsule TAKE 1 CAPSULE EVERY DAY 03/31/23   Eubanks, Jessica K, NP  HYDROcodone -acetaminophen  (NORCO/VICODIN) 5-325 MG tablet Take 0.5 tablets by mouth every 12 (twelve) hours as needed for moderate pain (pain score 4-6). 01/24/24   [name], NP  Krill Oil 350 MG CAPS Take 1 capsule by mouth daily.    [provider]  losartan  (COZAAR ) 50 MG tablet TAKE 1 TABLET EVERY DAY 12/01/23   Eubanks, Jessica K, NP  metoprolol  tartrate (LOPRESSOR ) 25 MG tablet TAKE 1 TABLET EVERY DAY 01/04/24   Eubanks, Jessica K, NP  pantoprazole  (PROTONIX ) 40 MG tablet TAKE 1 TABLET EVERY DAY 07/16/23   Eubanks, Jessica K, NP  venlafaxine  XR (EFFEXOR -XR) 37.5 MG 24 hr capsule TAKE 1 CAPSULE EVERY DAY WITH BREAKFAST 06/15/23   Eubanks, Jessica K, NP     Allergies: Codeine, Dilaudid [hydromorphone hcl], Macrodantin, Morphine and codeine, Sulfa antibiotics, and Ibuprofen    Review of Systems  Updated Vital Signs BP 123/74   Pulse 69   Temp (!) 97.5 F (36.4 C) (Oral)   Resp 19   SpO2 97%   Physical Exam Vitals and nursing note reviewed.  Constitutional:      General: She is not in acute distress.    Appearance: She is well-developed.     Comments: Frail elderly female awake and alert sitting upright speaking clearly  HENT:     Head: Normocephalic and atraumatic.  Eyes:     Conjunctiva/sclera: Conjunctivae normal.  Cardiovascular:     Rate and Rhythm: Normal rate and regular rhythm.  Pulmonary:     Effort: Pulmonary effort is normal. No respiratory distress.     Breath sounds: No stridor.  Abdominal:     General: There is no distension.  Musculoskeletal:     Comments: No obvious deformities and the patient flexes her freely to command, keeps her legs crossed while in the bed.  Skin:    General: Skin is warm and dry.  Neurological:     Mental Status: She is alert and oriented to person, place, and time.     Cranial Nerves: No cranial nerve deficit.  Psychiatric:        Mood and Affect: Mood normal.     (all labs  ordered are listed, but only abnormal results are displayed) Labs Reviewed  BASIC METABOLIC PANEL WITH GFR - Abnormal; Notable for the following components:      Result Value   Glucose, Bld 132 (*)    All other components within normal limits  CBC WITH DIFFERENTIAL/PLATELET - Abnormal; Notable for the following components:   RBC 3.55 (*)    HCT 35.9 (*)    MCV 101.1 (*)    MCH 34.4 (*)    Neutro Abs 9.8 (*)    Lymphs Abs 0.6 (*)    All other components within normal limits  PROTIME-INR  TYPE AND SCREEN    EKG: EKG Interpretation Date/Time:  Sunday April 23 2024 20:54:12 EDT Ventricular Rate:  58 PR Interval:  53 QRS Duration:  101 QT Interval:  449 QTC Calculation: 441 R Axis:   86  Text  Interpretation: Sinus rhythm Atrial premature complexes Short PR interval Borderline right axis deviation Probable left ventricular hypertrophy Nonspecific T abnrm, anterolateral leads Confirmed by Garrick Charleston [phone]) on 04/23/2024 9:05:50 PM  Radiology: [facility] Hip Unilat With Pelvis 2-3 Views Left Result Date: 04/23/2024 CLINICAL DATA:  Pain in left hip. EXAM: DG HIP (WITH OR WITHOUT PELVIS) 2-3V LEFT COMPARISON:  AP pelvis 07/24/2014. FINDINGS: Three views with AP pelvis and AP and frog-leg left hip. Generalized osteopenia, interval increased. There is no evidence of left hip fracture or dislocation. No pelvic fracture or diastasis is seen AP. Again noted are bilateral total hip joint replacements without evidence of dislocation or loosening. There is chronic fracture of the medial aspect of proximal left femoral metaphysis. There is pelvic enthesopathy, spurring at the symphysis and SI joints. Heavy vascular calcifications. IMPRESSION: 1. No evidence of acute fracture or dislocation. 2. Bilateral total hip joint replacements without evidence of dislocation or loosening. 3. Chronic fracture of the medial aspect of proximal left femoral metaphysis. 4. Osteopenia. 5. Heavy vascular calcifications. Electronically Signed   By: Francis Quam M.D.   On: 04/23/2024 21:16     Procedures   Medications Ordered in the ED  diclofenac  (FLECTOR ) 1.3 % 1 patch (has no administration in time range)  acetaminophen  (TYLENOL ) tablet 1,000 mg (1,000 mg Oral Given 04/23/24 2104)                                    Medical Decision Making Elderly female with history of bilateral prosthesis in her hips now presents with left hip pain, atraumatic.  Broad differential including musculoskeletal pain, periprosthetic fracture, loosening of hardware, no rash suggesting shingles. Patient received Tylenol , monitoring, labs, x-ray.  Amount and/or Complexity of Data Reviewed Independent Historian: EMS External Data Reviewed:  notes. Labs: ordered. Decision-making details documented in ED Course. Radiology: ordered and independent interpretation performed. Decision-making details documented in ED Course. ECG/medicine tests: ordered and independent interpretation performed. Decision-making details documented in ED Course.  Risk OTC drugs. Prescription drug management. Decision regarding hospitalization. Diagnosis or treatment significantly limited by social determinants of health.   10:50 PM On repeat exam patient in no distress, awake, alert.  Labs unremarkable, x-ray without evidence for fracture, and on repeat exam we discussed possibilities for pain including sprain versus strain, less likely disruption of hardware from prior prosthesis.  With distal neurovascular status that is unremarkable, unremarkable vitals, labs, patient will try topical anesthetic in addition to Tylenol .  11:12 PM Patient coming by her daughter.  With her daughter's presence patient  knowledges nausea, vomiting, prompting EMS notification.  Patient's pain now described as left lower quadrant, not left lateral proximal hip.  CT abdomen pelvis pending.     Final diagnoses:  Left hip pain  LLQ pain      Garrick Charleston, MD 04/23/24 2251    Garrick Charleston, MD 04/23/24 2313

## 2024-04-23 NOTE — ED Triage Notes (Signed)
 Patient is coming from home. Left is complaining of left hip pain, patient is unsure of when the symptoms started. Patient is also experiencing nausea and is unsure when that started as well. Hx of double hip replacement EMS VS 160/58 BP 66 HR 98% RA

## 2024-04-24 ENCOUNTER — Emergency Department (HOSPITAL_COMMUNITY)

## 2024-04-24 DIAGNOSIS — R109 Unspecified abdominal pain: Secondary | ICD-10-CM

## 2024-04-24 DIAGNOSIS — I10 Essential (primary) hypertension: Secondary | ICD-10-CM

## 2024-04-24 DIAGNOSIS — K8689 Other specified diseases of pancreas: Secondary | ICD-10-CM | POA: Diagnosis not present

## 2024-04-24 DIAGNOSIS — N390 Urinary tract infection, site not specified: Secondary | ICD-10-CM | POA: Diagnosis present

## 2024-04-24 DIAGNOSIS — M159 Polyosteoarthritis, unspecified: Secondary | ICD-10-CM

## 2024-04-24 DIAGNOSIS — K219 Gastro-esophageal reflux disease without esophagitis: Secondary | ICD-10-CM | POA: Diagnosis not present

## 2024-04-24 DIAGNOSIS — N200 Calculus of kidney: Secondary | ICD-10-CM | POA: Diagnosis not present

## 2024-04-24 DIAGNOSIS — F419 Anxiety disorder, unspecified: Secondary | ICD-10-CM | POA: Diagnosis not present

## 2024-04-24 DIAGNOSIS — N201 Calculus of ureter: Secondary | ICD-10-CM

## 2024-04-24 DIAGNOSIS — F32A Depression, unspecified: Secondary | ICD-10-CM | POA: Diagnosis not present

## 2024-04-24 DIAGNOSIS — G629 Polyneuropathy, unspecified: Secondary | ICD-10-CM

## 2024-04-24 DIAGNOSIS — R935 Abnormal findings on diagnostic imaging of other abdominal regions, including retroperitoneum: Secondary | ICD-10-CM | POA: Diagnosis not present

## 2024-04-24 DIAGNOSIS — K5730 Diverticulosis of large intestine without perforation or abscess without bleeding: Secondary | ICD-10-CM | POA: Diagnosis not present

## 2024-04-24 LAB — URINALYSIS, ROUTINE W REFLEX MICROSCOPIC
Bilirubin Urine: NEGATIVE
Glucose, UA: NEGATIVE mg/dL
Ketones, ur: NEGATIVE mg/dL
Nitrite: POSITIVE — AB
Protein, ur: NEGATIVE mg/dL
Specific Gravity, Urine: 1.028 (ref 1.005–1.030)
WBC, UA: >50 WBC/hpf (ref 0–5)
pH: 6 (ref 5.0–8.0)

## 2024-04-24 MED ORDER — ALBUTEROL SULFATE (2.5 MG/3ML) 0.083% IN NEBU: 2.5000 mg | INHALATION_SOLUTION | Freq: Four times a day (QID) | RESPIRATORY_TRACT | Status: DC | PRN

## 2024-04-24 MED ORDER — OXYCODONE HCL 5 MG PO TABS: 5.0000 mg | ORAL_TABLET | Freq: Four times a day (QID) | ORAL | Status: DC | PRN

## 2024-04-24 MED ORDER — POLYETHYLENE GLYCOL 3350 17 G PO PACK: 17.0000 g | PACK | Freq: Every day | ORAL | Status: DC | PRN

## 2024-04-24 MED ORDER — OXYBUTYNIN CHLORIDE 5 MG PO TABS
5.0000 mg | ORAL_TABLET | Freq: Two times a day (BID) | ORAL | Status: DC
  Administered 2024-04-24 – 2024-04-25 (×3): 5 mg via ORAL
  Filled 2024-04-24 (×3): qty 1

## 2024-04-24 MED ORDER — HYDROCODONE-ACETAMINOPHEN 5-325 MG PO TABS
0.5000 | ORAL_TABLET | Freq: Once | ORAL | Status: AC
  Administered 2024-04-24: 0.5 via ORAL
  Filled 2024-04-24: qty 1

## 2024-04-24 MED ORDER — VENLAFAXINE HCL ER 37.5 MG PO CP24
37.5000 mg | ORAL_CAPSULE | Freq: Every day | ORAL | Status: DC
  Administered 2024-04-25: 37.5 mg via ORAL
  Filled 2024-04-24: qty 1

## 2024-04-24 MED ORDER — POLYVINYL ALCOHOL 1.4 % OP SOLN: 1.0000 [drp] | Freq: Three times a day (TID) | OPHTHALMIC | Status: DC | PRN

## 2024-04-24 MED ORDER — IOHEXOL 350 MG/ML SOLN
75.0000 mL | Freq: Once | INTRAVENOUS | Status: AC | PRN
  Administered 2024-04-24: 75 mL via INTRAVENOUS

## 2024-04-24 MED ORDER — OXYCODONE HCL 5 MG PO TABS: 2.5000 mg | ORAL_TABLET | Freq: Four times a day (QID) | ORAL | Status: DC | PRN

## 2024-04-24 MED ORDER — ACETAMINOPHEN 325 MG PO TABS: 650.0000 mg | ORAL_TABLET | Freq: Four times a day (QID) | ORAL | Status: DC | PRN

## 2024-04-24 MED ORDER — SODIUM CHLORIDE 0.9 % IV SOLN
1.0000 g | Freq: Once | INTRAVENOUS | Status: AC
  Administered 2024-04-24: 1 g via INTRAVENOUS
  Filled 2024-04-24: qty 10

## 2024-04-24 MED ORDER — METOPROLOL TARTRATE 25 MG PO TABS
25.0000 mg | ORAL_TABLET | Freq: Every day | ORAL | Status: DC
  Administered 2024-04-24: 25 mg via ORAL
  Filled 2024-04-24: qty 1

## 2024-04-24 MED ORDER — GABAPENTIN 300 MG PO CAPS
300.0000 mg | ORAL_CAPSULE | Freq: Every day | ORAL | Status: DC
  Administered 2024-04-24: 300 mg via ORAL
  Filled 2024-04-24: qty 1

## 2024-04-24 MED ORDER — ENOXAPARIN SODIUM 40 MG/0.4ML IJ SOSY
40.0000 mg | PREFILLED_SYRINGE | Freq: Every day | INTRAMUSCULAR | Status: DC
  Administered 2024-04-24 – 2024-04-25 (×2): 40 mg via SUBCUTANEOUS
  Filled 2024-04-24 (×2): qty 0.4

## 2024-04-24 MED ORDER — PROCHLORPERAZINE EDISYLATE 10 MG/2ML IJ SOLN: 5.0000 mg | Freq: Four times a day (QID) | INTRAMUSCULAR | Status: DC | PRN

## 2024-04-24 MED ORDER — PANTOPRAZOLE SODIUM 40 MG PO TBEC
40.0000 mg | DELAYED_RELEASE_TABLET | Freq: Every day | ORAL | Status: DC
Start: 2024-04-24 — End: 2024-04-25
  Administered 2024-04-24 – 2024-04-25 (×2): 40 mg via ORAL
  Filled 2024-04-24 (×2): qty 1

## 2024-04-24 MED ORDER — LOSARTAN POTASSIUM 50 MG PO TABS
50.0000 mg | ORAL_TABLET | Freq: Every day | ORAL | Status: DC
  Administered 2024-04-24: 50 mg via ORAL
  Filled 2024-04-24: qty 1

## 2024-04-24 MED ORDER — HYDROCODONE-ACETAMINOPHEN 5-325 MG PO TABS: 0.5000 | ORAL_TABLET | Freq: Four times a day (QID) | ORAL | Status: DC | PRN

## 2024-04-24 MED ORDER — SODIUM CHLORIDE 0.9 % IV SOLN
1.0000 g | INTRAVENOUS | Status: DC
  Administered 2024-04-24: 1 g via INTRAVENOUS
  Filled 2024-04-24: qty 10

## 2024-04-24 MED ORDER — SODIUM CHLORIDE 0.9% FLUSH
3.0000 mL | Freq: Two times a day (BID) | INTRAVENOUS | Status: DC
  Administered 2024-04-24 – 2024-04-25 (×3): 3 mL via INTRAVENOUS

## 2024-04-24 NOTE — ED Notes (Signed)
 Patient transported to CT

## 2024-04-24 NOTE — Evaluation (Addendum)
 Physical Therapy Evaluation Patient Details Name: Denise Lane MRN: [id number] DOB: 1930-05-13 Today's Date: 04/24/2024  History of Present Illness  Pt is a 89 yo female that prevents to [facility] ED on 04/23/24 with complaints of insidious left abdominal/hip pain and denies any falls. CT shows distal ureteral stone at UVJ w/ hydroureter. PMH: bilateral THA, basal cell cancer, squamous carcinoma, osteoarthritis.  Clinical Impression  Pt presents with left flank and hip. Pt ambulated 200 ft with a RW and CGA for safety which is functional baseline for the pt. Pt does not need additional acute PT to address deficits. Pt states daughter can help if need and feels as if she's at baseline.       If plan is discharge home, recommend the following: A little help with walking and/or transfers   Can travel by private vehicle        Equipment Recommendations None recommended by PT  Recommendations for Other Services       Functional Status Assessment Patient has not had a recent decline in their functional status     Precautions / Restrictions Precautions Precautions: Fall Recall of Precautions/Restrictions: Intact Restrictions Weight Bearing Restrictions Per Provider Order: No      Mobility  Bed Mobility Overal bed mobility: Independent             General bed mobility comments: Help shift at end of session to a more comfortable position    Transfers Overall transfer level: Needs assistance Equipment used: Rolling walker (2 wheels) Transfers: Sit to/from Stand Sit to Stand: Contact guard assist                Ambulation/Gait Ambulation/Gait assistance: Contact guard assist Gait Distance (Feet): 200 Feet Assistive device: Rolling walker (2 wheels) Gait Pattern/deviations: Trunk flexed, Decreased stride length, Step-through pattern Gait velocity: Decreased     General Gait Details: Cues for to stand closer to [employer] Mobility      Tilt Bed    Modified Rankin (Stroke Patients Only)       Balance Overall balance assessment: Needs assistance Sitting-balance support: Feet supported Sitting balance-Leahy Scale: Good     Standing balance support: Bilateral upper extremity supported Standing balance-Leahy Scale: Fair Standing balance comment: Able to stand statically with RW. Ambulated in room w/o RW                             Pertinent Vitals/Pain Pain Assessment Pain Assessment: No/denies pain    Home Living Family/patient expects to be discharged to:: Private residence Living Arrangements: Alone Available Help at Discharge: Family;Available PRN/intermittently Type of Home: House Home Access: Stairs to enter;Ramped entrance   Entrance Stairs-Number of Steps: uses ramp for the most part   Home Layout: One level Home Equipment: Educational psychologist (4 wheels);Cane - single point      Prior Function Prior Level of Function : Independent/Modified Independent             Mobility Comments: pt denies falls       Extremity/Trunk Assessment   Upper Extremity Assessment Upper Extremity Assessment: Generalized weakness    Lower Extremity Assessment Lower Extremity Assessment: Generalized weakness    Cervical / Trunk Assessment Cervical / Trunk Assessment: Kyphotic  Communication   Communication Communication: No apparent difficulties    Cognition Arousal: Alert Behavior During Therapy: WFL for tasks assessed/performed   PT -  Cognitive impairments: No apparent impairments                         Following commands: Intact       Cueing Cueing Techniques: Verbal cues     General Comments General comments (skin integrity, edema, etc.): VSS on RA    Exercises     Assessment/Plan    PT Assessment Patient does not need any further PT services  PT Problem List         PT Treatment Interventions      PT Goals (Current goals can be found in the Care  Plan section)  Acute Rehab PT Goals Patient Stated Goal: Return home PT Goal Formulation: With patient Time For Goal Achievement: 05/08/24 Potential to Achieve Goals: Good    Frequency       Co-evaluation               AM-PAC PT 6 Clicks Mobility  Outcome Measure Help needed turning from your back to your side while in a flat bed without using bedrails?: None Help needed moving from lying on your back to sitting on the side of a flat bed without using bedrails?: None Help needed moving to and from a bed to a chair (including a wheelchair)?: A Little Help needed standing up from a chair using your arms (e.g., wheelchair or bedside chair)?: None Help needed to walk in hospital room?: A Little Help needed climbing 3-5 steps with a railing? : A Little 6 Click Score: 21    End of Session Equipment Utilized During Treatment: Gait belt Activity Tolerance: Patient tolerated treatment well Patient left: in bed;with call bell/phone within reach;with bed alarm set Nurse Communication: Mobility status PT Visit Diagnosis: Unsteadiness on feet (R26.81);Muscle weakness (generalized) (M62.81)    Time: 8391-8369 PT Time Calculation (min) (ACUTE ONLY): 22 min   Charges:   PT Evaluation $PT Eval Low Complexity: 1 Low   PT General Charges $$ ACUTE PT VISIT: 1 Visit         Quintin Campi, SPT  Acute Rehab  [phone]   Quintin Campi 04/24/2024, 4:55 PM

## 2024-04-24 NOTE — Consult Note (Addendum)
 Urology Consult   I have been asked to see the patient by Dr. Haze, for evaluation and management of 2 mm left distal ureteral stone and possible UTI.  Chief Complaint: Left groin pain  HPI:  Denise Lane is a 89 y.o. who presented to the ER overnight with 8 hours of left-sided groin pain.  CT significantly limited by bilateral hip prosthesis artifact, but left renal fullness and possible 2 mm left distal ureteral stone.  She denies any fevers, chills, chest pain, or shortness of breath.  No dysuria or gross hematuria.  She has some baseline urinary urgency that may be worse.  She had a prior kidney stone over 50 years ago she passed spontaneously.  History limited by age and frailty.  Daughter went home and not reachable by phone this morning.  Urinalysis 0-5 squamous cells, greater than 50 WBC, 11-20 RBC, nitrate positive, large leukocytes, many bacteria.  BMP and CBC benign.  PMH: Past Medical History:  Diagnosis Date   Basal cell cancer    face, legs (10/18/2012)   Bruises easily    Chronic lower back pain    Depression    takes Effexor  daily   Diverticulosis of sigmoid colon 11-20-2009   Dry eyes    uses Refresh eye drops daily as needed   Dysphagia    Gastric ulcer    GERD (gastroesophageal reflux disease)    takes Omeprazole daily   H/O hiatal hernia    Hemochromatosis 1987   Hemochromatosis    Hemorrhoids, internal 11-20-09   History of blood transfusion    S/P colonoscopy after polyp removed; got 2 units; esophageal bleed got 1 unit; really messed up hemochromatosis (1/7/20214)   History of bronchitis 1991-1996   chronic; related to winter (10/18/2012)   History of colonic polyps    NOTED 11-20-09 IN COLONOSCOPY REPORT   Hyperlipidemia    no meds required   Hypertension    takes Metoprolol  and Losartan  daily   Joint pain    Joint swelling    Muscle pain    Muscle spasm    takes Robaxin  daily as needed   Occasional tremors    Osteoarthritis     Osteoarthritis of right hip 10/18/2012   Osteoporosis    Overactive bladder    takes Myrbetriq  daily   PONV (postoperative nausea and vomiting)    Rheumatic fever    hx of   Squamous carcinoma    LLE (10/18/2012)   Stenosis of esophagus    Tremor, essential 10-29-09   Urinary urgency    takes Ditropan  daily   Vomiting    occasionally    Surgical History: Past Surgical History:  Procedure Laterality Date   APPENDECTOMY  1952   CATARACT EXTRACTION W/ INTRAOCULAR LENS  IMPLANT, BILATERAL  2001   both eyes (10/18/2012)   CHOLECYSTECTOMY  1977   COLONOSCOPY     DILATION AND CURETTAGE OF UTERUS  1975   ESOPHAGOGASTRODUODENOSCOPY     LUMBAR DISC SURGERY  1989   SHOULDER ARTHROSCOPY W/ ROTATOR CUFF REPAIR  1998?   left (10/18/2012)   SKIN CANCER EXCISION     multiple (10/18/2012)   TONSILLECTOMY  1953   TOTAL HIP ARTHROPLASTY  10/18/2012   right (10/18/2012)   TOTAL HIP ARTHROPLASTY  10/18/2012   Procedure: TOTAL HIP ARTHROPLASTY;  Surgeon: Fonda SHAUNNA Olmsted, MD;  Location: [facility] OR;  Service: Orthopedics;  Laterality: Right;   TOTAL HIP ARTHROPLASTY Left 07/24/2014   dr olmsted  TOTAL HIP ARTHROPLASTY Left 07/24/2014   Procedure: LEFT TOTAL HIP ARTHROPLASTY;  Surgeon: Fonda SHAUNNA Olmsted, MD;  Location: [facility] OR;  Service: Orthopedics;  Laterality: Left;   TUBAL LIGATION  1975     Allergies:  Allergies  Allergen Reactions   Codeine Nausea And Vomiting   Dilaudid [Hydromorphone Hcl] Hives and Other (See Comments)    whelps (10/18/2012)   Macrodantin Hives and Other (See Comments)    drug fever; chills; felt terrible (10/18/2012)   Morphine And Codeine Hives and Nausea And Vomiting   Sulfa Antibiotics Hives, Rash and Other (See Comments)    got real sick (10/18/2012)   Ibuprofen Other (See Comments)    felt like I have to go to the bathroom constantly    Family History: Family History  Problem Relation Age of Onset   Heart disease Mother    Heart disease Father     Hyperlipidemia Daughter    Heart disease Brother    Heart disease Brother    Heart disease Brother    Heart disease Brother    Cancer Sister    Melanoma Sister    Alzheimer's disease Sister    Heart disease Sister    Osteoporosis Sister     Social History:  reports that she has never smoked. She has never used smokeless tobacco. She reports that she does not drink alcohol  and does not use drugs.   Physical Exam: BP (!) 112/49   Pulse 65   Temp (!) 97.5 F (36.4 C) (Oral)   Resp 18   SpO2 96%    Constitutional: Elderly, frail, no acute distress. Cardiovascular: No clubbing, cyanosis, or edema. Respiratory: Normal respiratory effort, no increased work of breathing. GI: Abdomen is soft, nontender, nondistended, no abdominal masses   Laboratory Data: Urinalysis 0-5 squamous cells, greater than 50 WBC, 11-20 RBC, nitrate positive, large leukocytes, many bacteria.  BMP and CBC benign.  Pertinent Imaging: I have personally reviewed the CT scan showing subtle left renal fullness, evaluation of pelvis significantly limited by bilateral hip artifact, possible 2 mm left distal ureteral stone.  Assessment & Plan:   89 year old female with 1 day of left-sided groin pain, CT suggesting a possible 2 mm left distal ureteral stone with mild left renal fullness, urinalysis appears infected.  She does not have any fever, chills, UTI symptoms, or sign of sepsis or systemic infection.  Vitals normal, no leukocytosis, normal renal function.  We discussed options including trial of passage and antibiotics with admission to the hospital for monitoring and antibiotics versus proceeding to the OR for cystoscopy and ureteral stent.  We discussed the concept of a staged procedure with follow-up ureteroscopy after infection treated.  With her age and frailty, no evidence of systemic infection, I think trial of passage is very reasonable with antibiotics and monitoring.  Based on stone size and location,  >90% passage rate.  If she were to develop high fevers or hypotension, please contact urology.  Unable to reach daughter this morning, but will reach out again later today.  Recommendations:  - Agree with admission and antibiotics - Strain urine, can likely discharge tomorrow if stable and pain controlled - Contact urology on-call if clinical decompensation, fever, hypotension to consider ureteral stent placement  Redell JAYSON Burnet, MD  Total time spent on the floor was 45 minutes, with greater than 50% spent in counseling and coordination of care with the patient regarding left ureteral stone, possible UTI, treatment options.  [facility] [address]  [address], [address] [place], [place] [address] [phone]

## 2024-04-24 NOTE — Plan of Care (Signed)
  Problem: Acute Rehab PT Goals(only PT should resolve) Goal: Pt Will Go Sit To Supine/Side Flowsheets (Taken 04/24/2024 1647) Pt will go Sit to Supine/Side: Independently   Problem: Acute Rehab PT Goals(only PT should resolve) Goal: Patient Will Perform Sitting Balance Flowsheets (Taken 04/24/2024 1647) Patient will perform sitting balance: Independently   Problem: Acute Rehab PT Goals(only PT should resolve) Goal: Patient Will Transfer Sit To/From Stand Flowsheets (Taken 04/24/2024 1647) Patient will transfer sit to/from stand: Independently   Problem: Acute Rehab PT Goals(only PT should resolve) Goal: Pt Will Transfer Bed To Chair/Chair To Bed Flowsheets (Taken 04/24/2024 1647) Pt will Transfer Bed to Chair/Chair to Bed: Independently   Problem: Acute Rehab PT Goals(only PT should resolve) Goal: Pt Will Ambulate Flowsheets (Taken 04/24/2024 1647) Pt will Ambulate:  > 125 feet  Independently  with rolling walker

## 2024-04-24 NOTE — ED Notes (Signed)
 This nurse called CCMD to have patient monitored and admitted.

## 2024-04-24 NOTE — ED Provider Notes (Signed)
 Patient signed out to me by Dr. Garrick to follow-up on CT scan.  Patient seen with complaints of pain in the left side of the abdomen and hip area.  Workup at time of signout had been negative, CT abdomen pending.  CT has been performed and does show features that suggest distal ureteral stone at the UVJ with some hydroureter.  This does explain patient's current symptoms.  She does not tolerate a lot of pain medication.  She does use Vicodin at home, half a tablet.  Given half Vicodin tablet.  Reviewing her records, she has not had a urinalysis.  Urinalysis performed and does suggest infection.  Culture obtained.  Treated with Rocephin .  Discussed with on-call urology, Dr. Francisca.  He has reviewed the images, does not recommend stenting at this point.  Will see patient in consultation and monitor.  Will admit to medicine.   Haze Lonni PARAS, MD 04/24/24 [phone]

## 2024-04-24 NOTE — Plan of Care (Signed)

## 2024-04-24 NOTE — Care Management Obs Status (Signed)
 MEDICARE OBSERVATION STATUS NOTIFICATION   Patient Details  Name: Denise Lane MRN: [id number] Date of Birth: [birth year]   Medicare Observation Status Notification Given:  Yes  Spoke with patient rep. [name]  and copy of letter left  [name] 04/24/2024, 2:33 PM

## 2024-04-24 NOTE — H&P (Addendum)
 History and Physical    Patient: Denise Lane DOB: 1929-12-31 DOA: 04/23/2024 DOS: the patient was seen and examined on 04/24/2024 PCP: [name], NP  Patient coming from: Home  Chief Complaint:  Chief Complaint  Patient presents with   Hip Pain   HPI: Denise Lane is a 89 y.o. female with medical history significant of hyper tension, hyperlipidemia, hemochromatosis, nephrolithiasis, arthritis, and GERD who presented with presents with left hip pain.  She experienced severe sharp pain in her left hip that began last night, describing it as the most intense pain she has ever felt, stating it was 'about to kill me.'  No burning sensation during urination.  Her daughter makes note that the patient did have nausea and vomited bile least  2-3 times prior to coming into the hospital.  Since coming into the hospital her pain has subsided , and patient has not had any subsequent episodes of nausea or vomiting.  Daughter notes that her mother had a prior history of kidney stone, but it was over 70 years ago as it was before she was born.  She has a history of hemochromatosis diagnosed in 1990, which causes bruising on her arms, legs, and shoulders. She attributes this to a genetic issue, being one among her nine siblings with the condition. Her brother, two years younger, died of a heart attack.  She lives alone, with her daughter assisting with groceries, bills, and household chores. She mentions difficulty sleeping due to noise in the hospital currently.  Patient does make note that she still has her driving license and her car although she no longer drives it.  In the emergency department patient was noted to be afebrile with heart rates 47-73, respirations 12-23, blood pressures 104/53 to 148/54, and O2 saturations maintained.  Labs noted WBC 10.5, BUN 23, and creatinine 0.67.  Urinalysis positive for hemoglobin, large leukocytes, positive nitrites, many bacteria,  11-20 RBCs/hpf, and greater than 50 WBCs.  CT scan of the abdomen pelvis noted fullness in the left ureter suspicious for distal left ureteral stone and nonobstructing left renal stone.  Urine culture was obtained.  Patient was given Tylenol , hydrocodone , and Rocephin .  Urology had been consulted, but did not recommend any acute surgical intervention at this time.  Review of Systems: As mentioned in the history of present illness. All other systems reviewed and are negative. Past Medical History:  Diagnosis Date   Basal cell cancer    face, legs (10/18/2012)   Bruises easily    Chronic lower back pain    Depression    takes Effexor  daily   Diverticulosis of sigmoid colon 11-20-2009   Dry eyes    uses Refresh eye drops daily as needed   Dysphagia    Gastric ulcer    GERD (gastroesophageal reflux disease)    takes Omeprazole daily   H/O hiatal hernia    Hemochromatosis 1987   Hemochromatosis    Hemorrhoids, internal 11-20-09   History of blood transfusion    S/P colonoscopy after polyp removed; got 2 units; esophageal bleed got 1 unit; really messed up hemochromatosis (1/7/20214)   History of bronchitis 1991-1996   chronic; related to winter (10/18/2012)   History of colonic polyps    NOTED 11-20-09 IN COLONOSCOPY REPORT   Hyperlipidemia    no meds required   Hypertension    takes Metoprolol  and Losartan  daily   Joint pain    Joint swelling    Muscle pain  Muscle spasm    takes Robaxin  daily as needed   Occasional tremors    Osteoarthritis    Osteoarthritis of right hip 10/18/2012   Osteoporosis    Overactive bladder    takes Myrbetriq  daily   PONV (postoperative nausea and vomiting)    Rheumatic fever    hx of   Squamous carcinoma    LLE (10/18/2012)   Stenosis of esophagus    Tremor, essential 10-29-09   Urinary urgency    takes Ditropan  daily   Vomiting    occasionally   Past Surgical History:  Procedure Laterality Date   APPENDECTOMY  1952   CATARACT EXTRACTION  W/ INTRAOCULAR LENS  IMPLANT, BILATERAL  2001   both eyes (10/18/2012)   CHOLECYSTECTOMY  1977   COLONOSCOPY     DILATION AND CURETTAGE OF UTERUS  1975   ESOPHAGOGASTRODUODENOSCOPY     LUMBAR DISC SURGERY  1989   SHOULDER ARTHROSCOPY W/ ROTATOR CUFF REPAIR  1998?   left (10/18/2012)   SKIN CANCER EXCISION     multiple (10/18/2012)   TONSILLECTOMY  1953   TOTAL HIP ARTHROPLASTY  10/18/2012   right (10/18/2012)   TOTAL HIP ARTHROPLASTY  10/18/2012   Procedure: TOTAL HIP ARTHROPLASTY;  Surgeon: Fonda SHAUNNA Olmsted, MD;  Location: [facility] OR;  Service: Orthopedics;  Laterality: Right;   TOTAL HIP ARTHROPLASTY Left 07/24/2014   dr olmsted   TOTAL HIP ARTHROPLASTY Left 07/24/2014   Procedure: LEFT TOTAL HIP ARTHROPLASTY;  Surgeon: Fonda SHAUNNA Olmsted, MD;  Location: [facility] OR;  Service: Orthopedics;  Laterality: Left;   TUBAL LIGATION  1975   Social History:  reports that she has never smoked. She has never used smokeless tobacco. She reports that she does not drink alcohol  and does not use drugs.  Allergies  Allergen Reactions   Codeine Nausea And Vomiting   Dilaudid [Hydromorphone Hcl] Hives and Other (See Comments)    whelps (10/18/2012)   Macrodantin Hives and Other (See Comments)    drug fever; chills; felt terrible (10/18/2012)   Morphine And Codeine Hives and Nausea And Vomiting   Sulfa Antibiotics Hives, Rash and Other (See Comments)    got real sick (10/18/2012)   Ibuprofen Other (See Comments)    felt like I have to go to the bathroom constantly    Family History  Problem Relation Age of Onset   Heart disease Mother    Heart disease Father    Hyperlipidemia Daughter    Heart disease Brother    Heart disease Brother    Heart disease Brother    Heart disease Brother    Cancer Sister    Melanoma Sister    Alzheimer's disease Sister    Heart disease Sister    Osteoporosis Sister     Prior to Admission medications   Medication Sig Start Date End Date Taking? Authorizing Provider   acetaminophen  (TYLENOL ) 650 MG CR tablet Take 1,300 mg by mouth 2 (two) times daily.   Yes [provider]  carboxymethylcellulose (REFRESH PLUS) 0.5 % SOLN Place 1 drop into both eyes 3 (three) times daily as needed. Uses more frequently in left eye as needed.   Yes [provider]  cholecalciferol  (VITAMIN D) 1000 UNITS tablet Take 1,000 Units by mouth 2 (two) times daily. 10/17/12  Yes [provider]  cyanocobalamin 1000 MCG tablet Take 1,000 mcg by mouth daily.   Yes [provider]  diclofenac  (FLECTOR ) 1.3 % PTCH Place 1 patch onto the skin 2 (two) times  daily. 04/23/24  Yes Garrick Charleston, MD  gabapentin  (NEURONTIN ) 300 MG capsule TAKE 1 CAPSULE EVERY DAY Patient taking differently: Take 300 mg by mouth at bedtime. 03/31/23  Yes [name], NP  HYDROcodone -acetaminophen  (NORCO/VICODIN) 5-325 MG tablet Take 0.5 tablets by mouth every 12 (twelve) hours as needed for moderate pain (pain score 4-6). Patient taking differently: Take 0.5 tablets by mouth every 12 (twelve) hours as needed for severe pain (pain score 7-10) (severe arthritic pain). 01/24/24  Yes [name], NP  Krill Oil 350 MG CAPS Take 1 capsule by mouth daily.   Yes [provider]  losartan  (COZAAR ) 50 MG tablet TAKE 1 TABLET EVERY DAY Patient taking differently: Take 50 mg by mouth at bedtime. 12/01/23  Yes [name], NP  metoprolol  tartrate (LOPRESSOR ) 25 MG tablet TAKE 1 TABLET EVERY DAY Patient taking differently: Take 25 mg by mouth at bedtime. 01/04/24  Yes [name], NP  Multiple Vitamins-Minerals (PRESERVISION AREDS 2) CAPS Take 1 capsule by mouth in the morning and at bedtime.   Yes [provider]  oxybutynin  (DITROPAN ) 5 MG tablet Take 5 mg by mouth 2 (two) times daily.   Yes [provider]  pantoprazole  (PROTONIX ) 40 MG tablet TAKE 1 TABLET EVERY DAY 07/16/23  Yes [name], NP  venlafaxine  XR (EFFEXOR -XR) 37.5 MG 24  hr capsule TAKE 1 CAPSULE EVERY DAY WITH BREAKFAST 06/15/23  Yes [name], NP    Physical Exam: Vitals:   04/24/24 0430 04/24/24 0445 04/24/24 0500 04/24/24 0515  BP: 129/60 (!) 116/56 (!) 128/48 (!) 137/53  Pulse: 70 63 60 65  Resp: 13 15 15 19   Temp:      TempSrc:      SpO2: 99% 98% 97% 97%   Constitutional: Frail elderly female currently in no acute distress Eyes: PERRL, lids and conjunctivae normal ENMT: Mucous membranes are moist. Normal dentition.  Neck: normal, supple  Respiratory: clear to auscultation bilaterally, no wheezing, no crackles. Normal respiratory effort. No accessory muscle use.  Cardiovascular: Regular rate and rhythm, no murmurs / rubs / gallops. No extremity edema. 2+ pedal pulses. No carotid bruits.  Abdomen: no tenderness, no masses palpated. Bowel sounds positive.  Musculoskeletal: no clubbing / cyanosis. No joint deformity upper and lower extremities. Good ROM, no contractures. Normal muscle tone.  Skin: Areas of areas of bruising noted of the bilateral upper and lower extremities. Neurologic: CN 2-12 grossly intact. Sensation intact, DTR normal. Strength 5/5 in all 4.  Psychiatric: Normal judgment and insight. Alert and oriented x 3. Normal mood.   Data Reviewed:  EKG reveals sinus bradycardia at 58 bpm with premature atrial complexes.  Reviewed labs, imaging, and pertinent records as documented.  Assessment and Plan:  Complicated urinary tract infection Nephrolithiasis Patient presented with complaints of left sided groin pain.  Urinalysis positive for hemoglobin, large leukocytes, positive nitrites, many bacteria, 11-20 RBCs/hpf, and greater than 50 WBCs.  Urine culture had been obtained.  Patient had been started on empiric antibiotics with Rocephin .  Urology had been consulted but recommended trial of passage with antibiotics and monitoring. - Admit to a medical telemetry bed - Follow-up urine culture - Strain urine - Continue empiric  antibiotics of Rocephin  - Hydrocodone  as needed for pain - Tylenol  as needed for fever  Essential hypertension Blood pressures currently maintained. - Continue losartan  and metoprolol  as tolerated  Hereditary hemochromatosis - Clinically stable  Osteoarthritis Patient with history of bilateral hip arthroplasties.  Left hip  x-ray did not reveal any acute abnormality with bilateral total hip joint replacements in place without dislocation or loosening. - Continue diclofenac  patch and hydrocodone  as needed  Neuropathy - Continue gabapentin   Anxiety and depression - Continue venlafaxine   GERD - Continue Protonix   DVT prophylaxis: Lovenox  Advance Care Planning:   Code Status: Limited: Do not attempt resuscitation (DNR) -DNR-LIMITED -Do Not Intubate/DNI    Consults: Urology  Family Communication: Patient's daughter updated over the phone  Severity of Illness: The appropriate patient status for this patient is INPATIENT. Inpatient status is judged to be reasonable and necessary in order to provide the required intensity of service to ensure the patient's safety. The patient's presenting symptoms, physical exam findings, and initial radiographic and laboratory data in the context of their chronic comorbidities is felt to place them at high risk for further clinical deterioration. Furthermore, it is not anticipated that the patient will be medically stable for discharge from the hospital within 2 midnights of admission.   * I certify that at the point of admission it is my clinical judgment that the patient will require inpatient hospital care spanning beyond 2 midnights from the point of admission due to high intensity of service, high risk for further deterioration and high frequency of surveillance required.*  Author: Maximino DELENA Sharps, MD 04/24/2024 7:10 AM  For on call review [email or web address].

## 2024-04-24 NOTE — ED Notes (Signed)
 ED TO INPATIENT HANDOFF REPORT  ED Nurse Name and Phone #: Janeth RN [phone]  S Name/Age/Gender Denise Lane 89 y.o. female Room/Bed: 019C/019C  Code Status   Code Status: Limited: Do not attempt resuscitation (DNR) -DNR-LIMITED -Do Not Intubate/DNI   Home/SNF/Other Home Patient oriented to: self, place, time, and situation Is this baseline? Yes   Triage Complete: Triage complete  Chief Complaint Complicated UTI (urinary tract infection) [N39.0]  Triage Note Patient is coming from home. Left is complaining of left hip pain, patient is unsure of when the symptoms started. Patient is also experiencing nausea and is unsure when that started as well. Hx of double hip replacement EMS VS 160/58 BP 66 HR 98% RA   Allergies Allergies  Allergen Reactions   Codeine Nausea And Vomiting   Dilaudid [Hydromorphone Hcl] Hives and Other (See Comments)    whelps (10/18/2012)   Macrodantin Hives and Other (See Comments)    drug fever; chills; felt terrible (10/18/2012)   Morphine And Codeine Hives and Nausea And Vomiting   Sulfa Antibiotics Hives, Rash and Other (See Comments)    got real sick (10/18/2012)   Ibuprofen Other (See Comments)    felt like I have to go to the bathroom constantly    Level of Care/Admitting Diagnosis ED Disposition     ED Disposition  Admit   Condition  --   Comment  Hospital Area: [facility] [[id number]]  Level of Care: Telemetry Medical [104]  I expect the patient will be discharged within 24 hours: Yes  Inclusion criteria 2W: chest pain (low risk and no acute EKG changes), syncope < 65 yrs old, TIA (Asymptomatic), anemia requiring blood transfusion (except GI Bleed), minor fluid/electrolyte imbalances, low risk PE (stable/No O2 requirement): Meets criteria for [facility]-2W observation unit  Covid Evaluation: Asymptomatic - no recent exposure (last 10 days) testing not required  Diagnosis: Complicated UTI (urinary tract  infection) [280444]  Admitting Physician: SHONA TERRY SAILOR [[id number]]  Attending Physician: SHONA TERRY SAILOR [phone]  For patients discharging to extended facilities (i.e. SNF, AL, group homes or LTAC) initiate:: Discharge to SNF/Facility Placement COVID-19 Lab Testing Protocol          B Medical/Surgery History Past Medical History:  Diagnosis Date   Basal cell cancer    face, legs (10/18/2012)   Bruises easily    Chronic lower back pain    Depression    takes Effexor  daily   Diverticulosis of sigmoid colon 11-20-2009   Dry eyes    uses Refresh eye drops daily as needed   Dysphagia    Gastric ulcer    GERD (gastroesophageal reflux disease)    takes Omeprazole daily   H/O hiatal hernia    Hemochromatosis 1987   Hemochromatosis    Hemorrhoids, internal 11-20-09   History of blood transfusion    S/P colonoscopy after polyp removed; got 2 units; esophageal bleed got 1 unit; really messed up hemochromatosis (1/7/20214)   History of bronchitis 1991-1996   chronic; related to winter (10/18/2012)   History of colonic polyps    NOTED 11-20-09 IN COLONOSCOPY REPORT   Hyperlipidemia    no meds required   Hypertension    takes Metoprolol  and Losartan  daily   Joint pain    Joint swelling    Muscle pain    Muscle spasm    takes Robaxin  daily as needed   Occasional tremors    Osteoarthritis    Osteoarthritis of right hip 10/18/2012   Osteoporosis  Overactive bladder    takes Myrbetriq  daily   PONV (postoperative nausea and vomiting)    Rheumatic fever    hx of   Squamous carcinoma    LLE (10/18/2012)   Stenosis of esophagus    Tremor, essential 10-29-09   Urinary urgency    takes Ditropan  daily   Vomiting    occasionally   Past Surgical History:  Procedure Laterality Date   APPENDECTOMY  1952   CATARACT EXTRACTION W/ INTRAOCULAR LENS  IMPLANT, BILATERAL  2001   both eyes (10/18/2012)   CHOLECYSTECTOMY  1977   COLONOSCOPY     DILATION AND CURETTAGE OF UTERUS  1975    ESOPHAGOGASTRODUODENOSCOPY     LUMBAR DISC SURGERY  1989   SHOULDER ARTHROSCOPY W/ ROTATOR CUFF REPAIR  1998?   left (10/18/2012)   SKIN CANCER EXCISION     multiple (10/18/2012)   TONSILLECTOMY  1953   TOTAL HIP ARTHROPLASTY  10/18/2012   right (10/18/2012)   TOTAL HIP ARTHROPLASTY  10/18/2012   Procedure: TOTAL HIP ARTHROPLASTY;  Surgeon: Fonda SHAUNNA Olmsted, MD;  Location: [facility] OR;  Service: Orthopedics;  Laterality: Right;   TOTAL HIP ARTHROPLASTY Left 07/24/2014   dr olmsted   TOTAL HIP ARTHROPLASTY Left 07/24/2014   Procedure: LEFT TOTAL HIP ARTHROPLASTY;  Surgeon: Fonda SHAUNNA Olmsted, MD;  Location: [facility] OR;  Service: Orthopedics;  Laterality: Left;   TUBAL LIGATION  1975     A IV Location/Drains/Wounds Patient Lines/Drains/Airways Status     Active Line/Drains/Airways     Name Placement date Placement time Site Days   Peripheral IV 05/08/22 18 G Left Antecubital 05/08/22  1430  Antecubital  717   Peripheral IV 04/24/24 20 G 1 Anterior;Proximal;Right Forearm 04/24/24  0038  Forearm  less than 1   Incision 10/18/12 Hip Right 10/18/12  1333  -- 4206   Incision (Closed) 07/24/14 Hip Left 07/24/14  1150  -- 3562            Intake/Output Last 24 hours No intake or output data in the 24 hours ending 04/24/24 0531  Labs/Imaging Results for orders placed or performed during the hospital encounter of 04/23/24 (from the past 48 hours)  Basic metabolic panel     Status: Abnormal   Collection Time: 04/23/24  9:13 PM  Result Value Ref Range   Sodium 136 135 - 145 mmol/L   Potassium 3.7 3.5 - 5.1 mmol/L   Chloride 99 98 - 111 mmol/L   CO2 25 22 - 32 mmol/L   Glucose, Bld 132 (H) 70 - 99 mg/dL    Comment: Glucose reference range applies only to samples taken after fasting for at least 8 hours.   BUN 23 8 - 23 mg/dL   Creatinine, Ser 9.32 0.44 - 1.00 mg/dL   Calcium  9.5 8.9 - 10.3 mg/dL   GFR, Estimated >39 >39 mL/min    Comment: (NOTE) Calculated using the CKD-EPI Creatinine Equation  (2021)    Anion gap 12 5 - 15    Comment: Performed at [facility], [address], [place], [place] [address]  CBC with Differential     Status: Abnormal   Collection Time: 04/23/24  9:13 PM  Result Value Ref Range   WBC 10.5 4.0 - 10.5 K/uL   RBC 3.55 (L) 3.87 - 5.11 MIL/uL   Hemoglobin 12.2 12.0 - 15.0 g/dL   HCT 64.0 (L) 63.9 - 53.9 %   MCV 101.1 (H) 80.0 - 100.0 fL   MCH 34.4 (  H) 26.0 - 34.0 pg   MCHC 34.0 30.0 - 36.0 g/dL   RDW 88.2 88.4 - 84.4 %   Platelets 233 150 - 400 K/uL   nRBC 0.0 0.0 - 0.2 %   Neutrophils Relative % 92 %   Neutro Abs 9.8 (H) 1.7 - 7.7 K/uL   Lymphocytes Relative 6 %   Lymphs Abs 0.6 (L) 0.7 - 4.0 K/uL   Monocytes Relative 1 %   Monocytes Absolute 0.1 0.1 - 1.0 K/uL   Eosinophils Relative 0 %   Eosinophils Absolute 0.0 0.0 - 0.5 K/uL   Basophils Relative 0 %   Basophils Absolute 0.0 0.0 - 0.1 K/uL   Immature Granulocytes 1 %   Abs Immature Granulocytes 0.06 0.00 - 0.07 K/uL    Comment: Performed at [facility], [address], [place], [place] [address]  Protime-INR     Status: None   Collection Time: 04/23/24  9:13 PM  Result Value Ref Range   Prothrombin Time 14.0 11.4 - 15.2 seconds   INR 1.0 0.8 - 1.2    Comment: (NOTE) INR goal varies based on device and disease states. Performed at [facility], [address], [place], [place] [address]   Type and screen [facility]     Status: None   Collection Time: 04/23/24  9:13 PM  Result Value Ref Range   ABO/RH(D) O POS    Antibody Screen NEG    Sample Expiration      04/26/2024,2359 Performed at [facility], [address], [place], [place] [address]   Urinalysis, Routine w reflex microscopic -Urine, Clean Catch     Status: Abnormal   Collection Time: 04/24/24  2:55 AM  Result Value Ref Range   Color, Urine YELLOW YELLOW   APPearance HAZY (A) CLEAR   Specific Gravity, Urine 1.028 1.005 - 1.030   pH 6.0 5.0 - 8.0   Glucose, UA NEGATIVE  NEGATIVE mg/dL   Hgb urine dipstick MODERATE (A) NEGATIVE   Bilirubin Urine NEGATIVE NEGATIVE   Ketones, ur NEGATIVE NEGATIVE mg/dL   Protein, ur NEGATIVE NEGATIVE mg/dL   Nitrite POSITIVE (A) NEGATIVE   Leukocytes,Ua LARGE (A) NEGATIVE   RBC / HPF 11-20 0 - 5 RBC/hpf   WBC, UA >50 0 - 5 WBC/hpf   Bacteria, UA MANY (A) NONE SEEN   Squamous Epithelial / HPF 0-5 0 - 5 /HPF    Comment: Performed at [facility], [address], [place], [place] [address]   CT ABDOMEN PELVIS W CONTRAST Result Date: 04/24/2024 CLINICAL DATA:  Left lower quadrant pain EXAM: CT ABDOMEN AND PELVIS WITH CONTRAST TECHNIQUE: Multidetector CT imaging of the abdomen and pelvis was performed using the standard protocol following bolus administration of intravenous contrast. RADIATION DOSE REDUCTION: This exam was performed according to the departmental dose-optimization program which includes automated exposure control, adjustment of the mA and/or kV according to patient size and/or use of iterative reconstruction technique. CONTRAST:  75mL OMNIPAQUE  IOHEXOL  350 MG/ML SOLN COMPARISON:  None Available. FINDINGS: Lower chest: No acute abnormality. Hepatobiliary: No focal liver abnormality is seen. Status post cholecystectomy. No biliary dilatation. Pancreas: Predominately atrophic. Spleen: Normal in size without focal abnormality. Adrenals/Urinary Tract: Adrenal glands are within normal limits. Kidneys demonstrate a normal enhancement pattern bilaterally. Nonobstructing 2 mm stone in the lower pole of the left kidney is noted. Mild prominence of the left ureter is seen although the distal aspect is incompletely evaluated due  to scatter artifact from the hip placements. There is a density in the expected region of the left ureterovesical junction which may represent a distal ureteral stone. The bladder is decompressed. Stomach/Bowel: Mild diverticular change of the colon is noted. No evidence of diverticulitis is seen. No  obstructive changes in the colon are noted. The appendix has been surgically removed. Small bowel and stomach are unremarkable. Vascular/Lymphatic: Aortic atherosclerosis. No enlarged abdominal or pelvic lymph nodes. Reproductive: Uterus is small consistent with the advanced age. No adnexal mass is seen. Other: No abdominal wall hernia or abnormality. No abdominopelvic ascites. Musculoskeletal: Bilateral hip replacements are seen. Degenerative changes of lumbar spine are noted. IMPRESSION: Fullness of the left ureter. Findings suspicious for distal left ureteral stone are noted although evaluation is somewhat limited due to scatter artifact from the known hip replacements. Nonobstructing left renal stone. Diverticulosis without diverticulitis. Electronically Signed   By: Oneil Devonshire M.D.   On: 04/24/2024 02:09   DG Hip Unilat With Pelvis 2-3 Views Left Result Date: 04/23/2024 CLINICAL DATA:  Pain in left hip. EXAM: DG HIP (WITH OR WITHOUT PELVIS) 2-3V LEFT COMPARISON:  AP pelvis 07/24/2014. FINDINGS: Three views with AP pelvis and AP and frog-leg left hip. Generalized osteopenia, interval increased. There is no evidence of left hip fracture or dislocation. No pelvic fracture or diastasis is seen AP. Again noted are bilateral total hip joint replacements without evidence of dislocation or loosening. There is chronic fracture of the medial aspect of proximal left femoral metaphysis. There is pelvic enthesopathy, spurring at the symphysis and SI joints. Heavy vascular calcifications. IMPRESSION: 1. No evidence of acute fracture or dislocation. 2. Bilateral total hip joint replacements without evidence of dislocation or loosening. 3. Chronic fracture of the medial aspect of proximal left femoral metaphysis. 4. Osteopenia. 5. Heavy vascular calcifications. Electronically Signed   By: Francis Quam M.D.   On: 04/23/2024 21:16    Pending Labs Unresulted Labs (From admission, onward)     Start     Ordered    04/24/24 0358  Urine Culture  Add-on,   AD       Question:  Indication  Answer:  Flank Pain   04/24/24 0357            Vitals/Pain Today's Vitals   04/24/24 0430 04/24/24 0445 04/24/24 0500 04/24/24 0515  BP: 129/60 (!) 116/56 (!) 128/48 (!) 137/53  Pulse: 70 63 60 65  Resp: 13 15 15 19   Temp:      TempSrc:      SpO2: 99% 98% 97% 97%  PainSc:        Isolation Precautions No active isolations  Medications Medications  diclofenac  (FLECTOR ) 1.3 % 1 patch (1 patch Transdermal Patch Applied 04/23/24 2255)  acetaminophen  (TYLENOL ) tablet 650 mg (has no administration in time range)  oxyCODONE  (Oxy IR/ROXICODONE ) immediate release tablet 5 mg (has no administration in time range)  polyethylene glycol (MIRALAX  / GLYCOLAX ) packet 17 g (has no administration in time range)  prochlorperazine  (COMPAZINE ) injection 5 mg (has no administration in time range)  acetaminophen  (TYLENOL ) tablet 1,000 mg (1,000 mg Oral Given 04/23/24 2104)  iohexol  (OMNIPAQUE ) 350 MG/ML injection 75 mL (75 mLs Intravenous Contrast Given 04/24/24 0153)  HYDROcodone -acetaminophen  (NORCO/VICODIN) 5-325 MG per tablet 0.5 tablet (0.5 tablets Oral Given 04/24/24 0237)  cefTRIAXone  (ROCEPHIN ) 1 g in sodium chloride  0.9 % 100 mL IVPB (0 g Intravenous Stopped 04/24/24 0524)    Mobility walks     Focused Assessments Genitourinary assessment and  Musculoskeletal assessment   R Recommendations: See Admitting Provider Note  Report given to:   Additional Notes:

## 2024-04-25 DIAGNOSIS — Z87442 Personal history of urinary calculi: Secondary | ICD-10-CM | POA: Diagnosis not present

## 2024-04-25 DIAGNOSIS — R1030 Lower abdominal pain, unspecified: Secondary | ICD-10-CM | POA: Diagnosis not present

## 2024-04-25 DIAGNOSIS — N390 Urinary tract infection, site not specified: Secondary | ICD-10-CM | POA: Diagnosis not present

## 2024-04-25 LAB — CBC
HCT: 34.4 % — ABNORMAL LOW (ref 36.0–46.0)
Hemoglobin: 11.4 g/dL — ABNORMAL LOW (ref 12.0–15.0)
MCH: 34 pg (ref 26.0–34.0)
MCHC: 33.1 g/dL (ref 30.0–36.0)
MCV: 102.7 fL — ABNORMAL HIGH (ref 80.0–100.0)
Platelets: 217 K/uL (ref 150–400)
RBC: 3.35 MIL/uL — ABNORMAL LOW (ref 3.87–5.11)
RDW: 11.9 % (ref 11.5–15.5)
WBC: 8.9 K/uL (ref 4.0–10.5)
nRBC: 0 % (ref 0.0–0.2)

## 2024-04-25 LAB — BASIC METABOLIC PANEL WITH GFR
Anion gap: 9 (ref 5–15)
BUN: 11 mg/dL (ref 8–23)
CO2: 30 mmol/L (ref 22–32)
Calcium: 9.3 mg/dL (ref 8.9–10.3)
Chloride: 101 mmol/L (ref 98–111)
Creatinine, Ser: 0.71 mg/dL (ref 0.44–1.00)
GFR, Estimated: >60 mL/min (ref >60)
Glucose, Bld: 113 mg/dL — ABNORMAL HIGH (ref 70–99)
Potassium: 3.5 mmol/L (ref 3.5–5.1)
Sodium: 140 mmol/L (ref 135–145)

## 2024-04-25 LAB — PHOSPHORUS: Phosphorus: 3 mg/dL (ref 2.5–4.6)

## 2024-04-25 LAB — MAGNESIUM: Magnesium: 2.1 mg/dL (ref 1.7–2.4)

## 2024-04-25 LAB — FOLATE: Folate: 12.4 ng/mL (ref >5.9)

## 2024-04-25 LAB — VITAMIN B12: Vitamin B-12: 596 pg/mL (ref 180–914)

## 2024-04-25 MED ORDER — METOPROLOL TARTRATE 25 MG PO TABS: 25.0000 mg | ORAL_TABLET | Freq: Every day | ORAL | Status: DC

## 2024-04-25 MED ORDER — CEFADROXIL 500 MG PO CAPS: 500.0000 mg | ORAL_CAPSULE | Freq: Two times a day (BID) | ORAL | 0 refills | Status: AC

## 2024-04-25 MED ORDER — AMLODIPINE BESYLATE 5 MG PO TABS: 5.0000 mg | ORAL_TABLET | Freq: Every day | ORAL | 11 refills | Status: DC

## 2024-04-25 MED ORDER — LOSARTAN POTASSIUM 50 MG PO TABS: 50.0000 mg | ORAL_TABLET | Freq: Every day | ORAL | Status: DC

## 2024-04-25 NOTE — Discharge Summary (Signed)
 [facility] Discharge Summary   Patient: Denise Lane FMW:[id number]  PCP: [name], NP  Date of admission: 04/23/2024   Date of discharge:  04/25/2024     Discharge Diagnoses:  Principal Problem:   Complicated UTI (urinary tract infection) Active Problems:   Nephrolithiasis   Essential hypertension   Hereditary hemochromatosis (HCC)   Osteoarthritis   Neuropathy   Anxiety and depression   Gastroesophageal reflux disease without esophagitis   Admitted From: Home Disposition:  Home   Recommendations for Outpatient Follow-up:  Follow-up with PCP in 1 week, follow-up on urine culture sensitive report which is pending.  Continue to biotics for 5 days. Follow-up with urology if recurrent problem. Follow up LABS/TEST:     Follow-up Information     Josefina Chew, MD. Schedule an appointment as soon as possible for a visit .   Specialty: Orthopedic Surgery Why: As needed, If symptoms worsen Contact information: [address] [place] [address] [phone]         [facility] .   Contact information: [address] [place] [address] [place]  [address] [phone]        [name], NP Follow up in 1 week(s).   Specialty: Geriatric Medicine Contact information: [address] [place] [address] [phone]                Diet recommendation: Regular diet  Activity: The patient is advised to gradually reintroduce usual activities, as tolerated  Discharge Condition: stable  Code Status: DNR -intervene  History of present illness: As per the H and P dictated on admission  Hospital Course:  Denise Lane is a 89 y.o. female with medical history significant of hyper tension, hyperlipidemia, hemochromatosis, nephrolithiasis, arthritis, and GERD who presented with presents with left hip pain.   ED workup: CT scan showed left ureteral stone Neurology was consulted, recommended  conservative management. He was admitted on observation overnight   Assessment and Plan:   Left ureteral calculi Pain resolved, may have passed calculi Seen by urology, recommended conservative management, did not require cystoscopy and stent insertion. Follow-up with urology as an outpatient if recurrent renal calculi.  Cleared by urology to discharge home and follow-up as an outpatient.  Complicated UTI secondary to left ureteral calculi Urine culture growing E. coli, sensitive report is pending S/p ceftriaxone , started cefadroxil  5 mg p.o. twice daily for 5 days Follow-up with PCP in 1 week and urology if recurrent infection or calculi  Essential hypertension - Home meds losartan  and metoprolol  7/15 noticed low blood pressure, resumed losartan  with holding parameters.  Discussed with patient's daughter that blood pressure is low and we can give her fluid bolus but patient was not symptomatic and her daughter refused fluid bolus and stated that she will be fine to go home in her own environment. Discontinued metoprolol  due to bradycardia Started amlodipine  5 mg p.o. daily if SBP greater than 140 mmHg Monitor BP at home and follow with PCP to titrate medications accordingly.    Hereditary hemochromatosis - Clinically stable   Osteoarthritis Patient with history of bilateral hip arthroplasties.  Left hip x-ray did not reveal any acute abnormality with bilateral total hip joint replacements in place without dislocation or loosening. - Continue diclofenac  patch and hydrocodone  as needed   Neuropathy - Continue gabapentin    Anxiety and depression - Continue venlafaxine    GERD - Continue Protonix    Body mass index is 18.82 kg/m.  Nutrition  Interventions:  - Patient was instructed, not to drive, operate heavy machinery, perform activities at heights, swimming or participation in water activities or provide baby sitting services while on Pain, Sleep and Anxiety Medications;  until her outpatient Physician has advised to do so again.  - Also recommended to not to take more than prescribed Pain, Sleep and Anxiety Medications.  Patient was ambulatory without any assistance. On the day of the discharge the patient's vitals were stable, and no other acute medical condition were reported by patient. the patient was felt safe to be discharge at Home.  Consultants: Urologist Procedures: None  Discharge Exam: General: Appear in no distress, no Rash; Oral Mucosa Clear, moist. Cardiovascular: S1 and S2 Present, no Murmur, Respiratory: normal respiratory effort, Bilateral Air entry present and no Crackles, no wheezes Abdomen: Bowel Sound present, Soft and no tenderness, no hernia Extremities: no Pedal edema, no calf tenderness Neurology: alert and oriented to time, place, and person affect appropriate.  Filed Weights   04/24/24 0841  Weight: 48.2 kg   Vitals:   04/25/24 0805 04/25/24 1207  BP: (!) 100/48 (!) 97/50  Pulse: (!) 53 (!) 46  Resp:    Temp: 97.9 F (36.6 C) 98.1 F (36.7 C)  SpO2: 97% 100%    DISCHARGE MEDICATION: Allergies as of 04/25/2024       Reactions   Codeine Nausea And Vomiting   Dilaudid [hydromorphone Hcl] Hives, Other (See Comments)   whelps (10/18/2012)   Macrodantin Hives, Other (See Comments)   drug fever; chills; felt terrible (10/18/2012)   Morphine And Codeine Hives, Nausea And Vomiting   Sulfa Antibiotics Hives, Rash, Other (See Comments)   got real sick (10/18/2012)   Ibuprofen Other (See Comments)   felt like I have to go to the bathroom constantly        Medication List     STOP taking these medications    metoprolol  tartrate 25 MG tablet Commonly known as: LOPRESSOR        TAKE these medications    acetaminophen  650 MG CR tablet Commonly known as: TYLENOL  Take 1,300 mg by mouth 2 (two) times daily.   amLODipine  5 MG tablet Commonly known as: NORVASC  Take 1 tablet (5 mg total) by mouth daily. Start  if Systolic BP above 140 mmHg Start taking on: April 26, 2024   carboxymethylcellulose 0.5 % Soln Commonly known as: REFRESH PLUS Place 1 drop into both eyes 3 (three) times daily as needed. Uses more frequently in left eye as needed.   cefadroxil  500 MG capsule Commonly known as: DURICEF Take 1 capsule (500 mg total) by mouth 2 (two) times daily for 5 days.   cholecalciferol  1000 units tablet Commonly known as: VITAMIN D Take 1,000 Units by mouth 2 (two) times daily.   cyanocobalamin 1000 MCG tablet Take 1,000 mcg by mouth daily.   gabapentin  300 MG capsule Commonly known as: NEURONTIN  TAKE 1 CAPSULE EVERY DAY What changed: when to take this   HYDROcodone -acetaminophen  5-325 MG tablet Commonly known as: NORCO/VICODIN Take 0.5 tablets by mouth every 12 (twelve) hours as needed for moderate pain (pain score 4-6). What changed: reasons to take this   Krill Oil 350 MG Caps Take 1 capsule by mouth daily.   losartan  50 MG tablet Commonly known as: COZAAR  Take 1 tablet (50 mg total) by mouth at bedtime. Skip the dose if SYSTOLIC BP <130 What changed:  when to take this additional instructions   oxybutynin  5 MG tablet Commonly known as:  DITROPAN  Take 5 mg by mouth 2 (two) times daily.   pantoprazole  40 MG tablet Commonly known as: PROTONIX  TAKE 1 TABLET EVERY DAY   PreserVision AREDS 2 Caps Take 1 capsule by mouth in the morning and at bedtime.   venlafaxine  XR 37.5 MG 24 hr capsule Commonly known as: EFFEXOR -XR TAKE 1 CAPSULE EVERY DAY WITH BREAKFAST       Allergies  Allergen Reactions   Codeine Nausea And Vomiting   Dilaudid [Hydromorphone Hcl] Hives and Other (See Comments)    whelps (10/18/2012)   Macrodantin Hives and Other (See Comments)    drug fever; chills; felt terrible (10/18/2012)   Morphine And Codeine Hives and Nausea And Vomiting   Sulfa Antibiotics Hives, Rash and Other (See Comments)    got real sick (10/18/2012)   Ibuprofen Other (See  Comments)    felt like I have to go to the bathroom constantly   Discharge Instructions     Call MD for:  difficulty breathing, headache or visual disturbances   Complete by: As directed    Call MD for:  extreme fatigue   Complete by: As directed    Call MD for:  persistant dizziness or light-headedness   Complete by: As directed    Call MD for:  persistant nausea and vomiting   Complete by: As directed    Call MD for:  severe uncontrolled pain   Complete by: As directed    Call MD for:  temperature >100.4   Complete by: As directed    Diet - low sodium heart healthy   Complete by: As directed    Discharge instructions   Complete by: As directed    Follow-up with PCP in 1 week, follow-up on urine culture sensitive report which is pending.  Continue to biotics for 5 days. Follow-up with urology if recurrent problem.   Increase activity slowly   Complete by: As directed        The results of significant diagnostics from this hospitalization (including imaging, microbiology, ancillary and laboratory) are listed below for reference.    Significant Diagnostic Studies: CT ABDOMEN PELVIS W CONTRAST Result Date: 04/24/2024 CLINICAL DATA:  Left lower quadrant pain EXAM: CT ABDOMEN AND PELVIS WITH CONTRAST TECHNIQUE: Multidetector CT imaging of the abdomen and pelvis was performed using the standard protocol following bolus administration of intravenous contrast. RADIATION DOSE REDUCTION: This exam was performed according to the departmental dose-optimization program which includes automated exposure control, adjustment of the mA and/or kV according to patient size and/or use of iterative reconstruction technique. CONTRAST:  75mL OMNIPAQUE  IOHEXOL  350 MG/ML SOLN COMPARISON:  None Available. FINDINGS: Lower chest: No acute abnormality. Hepatobiliary: No focal liver abnormality is seen. Status post cholecystectomy. No biliary dilatation. Pancreas: Predominately atrophic. Spleen: Normal in size  without focal abnormality. Adrenals/Urinary Tract: Adrenal glands are within normal limits. Kidneys demonstrate a normal enhancement pattern bilaterally. Nonobstructing 2 mm stone in the lower pole of the left kidney is noted. Mild prominence of the left ureter is seen although the distal aspect is incompletely evaluated due to scatter artifact from the hip placements. There is a density in the expected region of the left ureterovesical junction which may represent a distal ureteral stone. The bladder is decompressed. Stomach/Bowel: Mild diverticular change of the colon is noted. No evidence of diverticulitis is seen. No obstructive changes in the colon are noted. The appendix has been surgically removed. Small bowel and stomach are unremarkable. Vascular/Lymphatic: Aortic atherosclerosis. No enlarged abdominal or pelvic lymph  nodes. Reproductive: Uterus is small consistent with the advanced age. No adnexal mass is seen. Other: No abdominal wall hernia or abnormality. No abdominopelvic ascites. Musculoskeletal: Bilateral hip replacements are seen. Degenerative changes of lumbar spine are noted. IMPRESSION: Fullness of the left ureter. Findings suspicious for distal left ureteral stone are noted although evaluation is somewhat limited due to scatter artifact from the known hip replacements. Nonobstructing left renal stone. Diverticulosis without diverticulitis. Electronically Signed   By: Oneil Devonshire M.D.   On: 04/24/2024 02:09   DG Hip Unilat With Pelvis 2-3 Views Left Result Date: 04/23/2024 CLINICAL DATA:  Pain in left hip. EXAM: DG HIP (WITH OR WITHOUT PELVIS) 2-3V LEFT COMPARISON:  AP pelvis 07/24/2014. FINDINGS: Three views with AP pelvis and AP and frog-leg left hip. Generalized osteopenia, interval increased. There is no evidence of left hip fracture or dislocation. No pelvic fracture or diastasis is seen AP. Again noted are bilateral total hip joint replacements without evidence of dislocation or  loosening. There is chronic fracture of the medial aspect of proximal left femoral metaphysis. There is pelvic enthesopathy, spurring at the symphysis and SI joints. Heavy vascular calcifications. IMPRESSION: 1. No evidence of acute fracture or dislocation. 2. Bilateral total hip joint replacements without evidence of dislocation or loosening. 3. Chronic fracture of the medial aspect of proximal left femoral metaphysis. 4. Osteopenia. 5. Heavy vascular calcifications. Electronically Signed   By: Francis Quam M.D.   On: 04/23/2024 21:16    Microbiology: Recent Results (from the past 240 hours)  Urine Culture     Status: Abnormal (Preliminary result)   Collection Time: 04/24/24  2:55 AM   Specimen: Urine, Clean Catch  Result Value Ref Range Status   Specimen Description URINE, CLEAN CATCH  Final   Special Requests NONE  Final   Culture (A)  Final    >=100,000 COLONIES/mL ESCHERICHIA COLI SUSCEPTIBILITIES TO FOLLOW Performed at [facility], [address], [place], [place] [address]    Report Status PENDING  Incomplete     Labs: CBC: Recent Labs  Lab 04/23/24 2113 04/25/24 0437  WBC 10.5 8.9  NEUTROABS 9.8*  --   HGB 12.2 11.4*  HCT 35.9* 34.4*  MCV 101.1* 102.7*  PLT 233 217   Basic Metabolic Panel: Recent Labs  Lab 04/23/24 2113 04/25/24 0437  NA 136 140  K 3.7 3.5  CL 99 101  CO2 25 30  GLUCOSE 132* 113*  BUN 23 11  CREATININE 0.67 0.71  CALCIUM  9.5 9.3  MG  --  2.1  PHOS  --  3.0   Liver Function Tests: No results for input(s): AST, ALT, ALKPHOS, BILITOT, PROT, ALBUMIN  in the last 168 hours. No results for input(s): LIPASE, AMYLASE in the last 168 hours. No results for input(s): AMMONIA in the last 168 hours. Cardiac Enzymes: No results for input(s): CKTOTAL, CKMB, CKMBINDEX, TROPONINI in the last 168 hours. BNP (last 3 results) No results for input(s): BNP in the last 8760 hours. CBG: No results for input(s): GLUCAP in  the last 168 hours.  Time spent: 35 minutes  Signed:  Elvan Sor  [facility] 04/25/2024 2:31 PM

## 2024-04-25 NOTE — Care Management Obs Status (Cosign Needed)
 MEDICARE OBSERVATION STATUS NOTIFICATION   Patient Details  Name: Denise Lane MRN: [id number] Date of Birth: May 27, 1930   Medicare Observation Status Notification Given:  Yes    Rosaline JONELLE Joe, RN 04/25/2024, 11:42 AM

## 2024-04-25 NOTE — Progress Notes (Signed)
 Subjective: First time meeting Denise Lane.  She was sleeping on my arrival but awoke easily.  She was alert, oriented, no distress.  Her left-sided groin pain has totally resolved.  I spoke with her daughter, a retired Engineer, civil (consulting).  All questions were answered to her satisfaction.  No acute events overnight.  Objective: Vital signs in last 24 hours: Temp:  [97.5 F (36.4 C)-98.8 F (37.1 C)] 97.9 F (36.6 C) (07/15 0805) Pulse Rate:  [53-67] 53 (07/15 0805) Resp:  [16-19] 19 (07/15 0428) BP: (100-150)/(48-88) 100/48 (07/15 0805) SpO2:  [96 %-99 %] 97 % (07/15 0805)  Assessment/Plan: # Possible 2 mm left distal ureteral stone  Very high probability of passing without intervention.  Patient reports that her left-sided groin pain has totally resolved.  Due to hardware artifact, low yield for repeat imaging.  We will assume that if she had a stone that has passed. Renal function preserved Okay to discharge from a urologic perspective.  No surgical indication.  Patient's daughter will follow-up with [facility] for reassessment if there are any further symptoms. She will sign off at this time.  Please call with questions.  Intake/Output from previous day: 07/14 0701 - 07/15 0700 In: 343 [P.O.:240; I.V.:3; IV Piggyback:100] Out: -   Intake/Output this shift: No intake/output data recorded.  Physical Exam:  General: Alert and oriented CV: No cyanosis Lungs: equal chest rise Abdomen: Soft, NTND, no rebound or guarding  Lab Results: Recent Labs    04/23/24 2113 04/25/24 0437  HGB 12.2 11.4*  HCT 35.9* 34.4*   BMET Recent Labs    04/23/24 2113 04/25/24 0437  NA 136 140  K 3.7 3.5  CL 99 101  CO2 25 30  GLUCOSE 132* 113*  BUN 23 11  CREATININE 0.67 0.71  CALCIUM  9.5 9.3  HGB 12.2 11.4*  WBC 10.5 8.9     Studies/Results: CT ABDOMEN PELVIS W CONTRAST Result Date: 04/24/2024 CLINICAL DATA:  Left lower quadrant pain EXAM: CT ABDOMEN AND PELVIS WITH CONTRAST  TECHNIQUE: Multidetector CT imaging of the abdomen and pelvis was performed using the standard protocol following bolus administration of intravenous contrast. RADIATION DOSE REDUCTION: This exam was performed according to the departmental dose-optimization program which includes automated exposure control, adjustment of the mA and/or kV according to patient size and/or use of iterative reconstruction technique. CONTRAST:  75mL OMNIPAQUE  IOHEXOL  350 MG/ML SOLN COMPARISON:  None Available. FINDINGS: Lower chest: No acute abnormality. Hepatobiliary: No focal liver abnormality is seen. Status post cholecystectomy. No biliary dilatation. Pancreas: Predominately atrophic. Spleen: Normal in size without focal abnormality. Adrenals/Urinary Tract: Adrenal glands are within normal limits. Kidneys demonstrate a normal enhancement pattern bilaterally. Nonobstructing 2 mm stone in the lower pole of the left kidney is noted. Mild prominence of the left ureter is seen although the distal aspect is incompletely evaluated due to scatter artifact from the hip placements. There is a density in the expected region of the left ureterovesical junction which may represent a distal ureteral stone. The bladder is decompressed. Stomach/Bowel: Mild diverticular change of the colon is noted. No evidence of diverticulitis is seen. No obstructive changes in the colon are noted. The appendix has been surgically removed. Small bowel and stomach are unremarkable. Vascular/Lymphatic: Aortic atherosclerosis. No enlarged abdominal or pelvic lymph nodes. Reproductive: Uterus is small consistent with the advanced age. No adnexal mass is seen. Other: No abdominal wall hernia or abnormality. No abdominopelvic ascites. Musculoskeletal: Bilateral hip replacements are seen. Degenerative changes of lumbar  spine are noted. IMPRESSION: Fullness of the left ureter. Findings suspicious for distal left ureteral stone are noted although evaluation is somewhat  limited due to scatter artifact from the known hip replacements. Nonobstructing left renal stone. Diverticulosis without diverticulitis. Electronically Signed   By: Oneil Devonshire M.D.   On: 04/24/2024 02:09   DG Hip Unilat With Pelvis 2-3 Views Left Result Date: 04/23/2024 CLINICAL DATA:  Pain in left hip. EXAM: DG HIP (WITH OR WITHOUT PELVIS) 2-3V LEFT COMPARISON:  AP pelvis 07/24/2014. FINDINGS: Three views with AP pelvis and AP and frog-leg left hip. Generalized osteopenia, interval increased. There is no evidence of left hip fracture or dislocation. No pelvic fracture or diastasis is seen AP. Again noted are bilateral total hip joint replacements without evidence of dislocation or loosening. There is chronic fracture of the medial aspect of proximal left femoral metaphysis. There is pelvic enthesopathy, spurring at the symphysis and SI joints. Heavy vascular calcifications. IMPRESSION: 1. No evidence of acute fracture or dislocation. 2. Bilateral total hip joint replacements without evidence of dislocation or loosening. 3. Chronic fracture of the medial aspect of proximal left femoral metaphysis. 4. Osteopenia. 5. Heavy vascular calcifications. Electronically Signed   By: Francis Quam M.D.   On: 04/23/2024 21:16      LOS: 1 day   Ole Bourdon, NP [facility] Pager: [phone]  04/25/2024, 10:33 AM

## 2024-04-25 NOTE — Plan of Care (Signed)

## 2024-04-25 NOTE — Progress Notes (Addendum)
 Transition of Care [facility]) - Inpatient Brief Assessment   Patient Details  Name: Denise Lane MRN: [id number] Date of Birth: 1930-06-01  [facility] CM/SW Contact:    Rosaline JONELLE Joe, RN Phone Number: 04/25/2024, 10:00 AM   Clinical Narrative: Patient admitted from home with Complicated urinary tract infection Nephrolithiasis - urology to be consulted per MD - IV antibiotics for continue.  No IP Care management needs at this time.  CM will continue to follow the patient as she progresses.  CM spoke with UR and UR has requested Moon letter be provided  again and Code 44 completed.  Moon letter provided to the daughter at the bedside and code 40 completed.     Transition of Care Asessment: Insurance and Status: (P) Insurance coverage has been reviewed Patient has primary care physician: (P) Yes Home environment has been reviewed: (P) from home Prior level of function:: (P) Rollator, self Prior/Current Home Services: (P) No current home services Social Drivers of Health Review: (P) SDOH reviewed no interventions necessary Readmission risk has been reviewed: (P) Yes Transition of care needs: (P) no transition of care needs at this time

## 2024-04-25 NOTE — Care Management CC44 (Cosign Needed)
 Condition Code 44 Documentation Completed  Patient Details  Name: VERGIE ZAHM MRN: [id number] Date of Birth: [birth year]   Condition Code 44 given:  Yes Patient signature on Condition Code 44 notice:  Yes Documentation of 2 MD's agreement:  Yes Code 44 added to claim:  Yes    Rosaline JONELLE Joe, RN 04/25/2024, 11:42 AM

## 2024-04-26 ENCOUNTER — Encounter: Payer: Self-pay | Admitting: Nurse Practitioner

## 2024-04-26 LAB — URINE CULTURE

## 2024-04-27 ENCOUNTER — Encounter: Payer: Self-pay | Admitting: Hematology

## 2024-04-28 ENCOUNTER — Telehealth: Payer: Self-pay | Admitting: Hematology

## 2024-04-28 NOTE — Telephone Encounter (Signed)
 Called patient  daughter to schedule appointment and is aware of the upcoming appointments.

## 2024-05-02 ENCOUNTER — Ambulatory Visit (INDEPENDENT_AMBULATORY_CARE_PROVIDER_SITE_OTHER): Admitting: Family

## 2024-05-02 VITALS — BP 132/74 | HR 67 | Temp 97.8°F | Resp 18 | Ht 63.0 in | Wt 101.0 lb

## 2024-05-02 DIAGNOSIS — I10 Essential (primary) hypertension: Secondary | ICD-10-CM

## 2024-05-02 DIAGNOSIS — N200 Calculus of kidney: Secondary | ICD-10-CM | POA: Diagnosis not present

## 2024-05-02 DIAGNOSIS — R29818 Other symptoms and signs involving the nervous system: Secondary | ICD-10-CM | POA: Diagnosis not present

## 2024-05-02 LAB — CBC WITH DIFFERENTIAL/PLATELET
Absolute Lymphocytes: 1734 {cells}/uL (ref 850–3900)
Absolute Monocytes: 659 {cells}/uL (ref 200–950)
Basophils Absolute: 32 {cells}/uL (ref 0–200)
Basophils Relative: 0.5 %
Eosinophils Absolute: 109 {cells}/uL (ref 15–500)
Eosinophils Relative: 1.7 %
HCT: 35.1 % (ref 35.0–45.0)
Hemoglobin: 11.6 g/dL — ABNORMAL LOW (ref 11.7–15.5)
MCH: 33.7 pg — ABNORMAL HIGH (ref 27.0–33.0)
MCHC: 33 g/dL (ref 32.0–36.0)
MCV: 102 fL — ABNORMAL HIGH (ref 80.0–100.0)
MPV: 9.2 fL (ref 7.5–12.5)
Monocytes Relative: 10.3 %
Neutro Abs: 3866 {cells}/uL (ref 1500–7800)
Neutrophils Relative %: 60.4 %
Platelets: 331 Thousand/uL (ref 140–400)
RBC: 3.44 Million/uL — ABNORMAL LOW (ref 3.80–5.10)
RDW: 11.5 % (ref 11.0–15.0)
Total Lymphocyte: 27.1 %
WBC: 6.4 Thousand/uL (ref 3.8–10.8)

## 2024-05-02 LAB — COMPREHENSIVE METABOLIC PANEL WITH GFR
AG Ratio: 2 (calc) (ref 1.0–2.5)
ALT: 12 U/L (ref 6–29)
AST: 16 U/L (ref 10–35)
Albumin: 4.2 g/dL (ref 3.6–5.1)
Alkaline phosphatase (APISO): 67 U/L (ref 37–153)
BUN/Creatinine Ratio: 43 (calc) — ABNORMAL HIGH (ref 6–22)
BUN: 28 mg/dL — ABNORMAL HIGH (ref 7–25)
CO2: 32 mmol/L (ref 20–32)
Calcium: 9.6 mg/dL (ref 8.6–10.4)
Chloride: 100 mmol/L (ref 98–110)
Creat: 0.65 mg/dL (ref 0.60–0.95)
Globulin: 2.1 g/dL (ref 1.9–3.7)
Glucose, Bld: 99 mg/dL (ref 65–99)
Potassium: 4.5 mmol/L (ref 3.5–5.3)
Sodium: 139 mmol/L (ref 135–146)
Total Bilirubin: 0.4 mg/dL (ref 0.2–1.2)
Total Protein: 6.3 g/dL (ref 6.1–8.1)
eGFR: 83 mL/min/1.73m2 (ref >=60)

## 2024-05-02 NOTE — Progress Notes (Signed)
 Provider: Neiman Roots FNP-C  Denise Harlene POUR, NP  Patient Care Team: Denise Harlene POUR, NP as PCP - General (Geriatric Medicine) Lanny Callander, MD as Consulting Physician (Hematology and Oncology) Denise Lane, Denise Lane as [facility] Denise Lane, Denise Lane  Extended Emergency Contact Information Primary Emergency Contact: Kemp,Denise Lane Address: [address]           [place], [place] [address] [place] Home Phone: [phone] Relation: Daughter  Code Status:  DNR Goals of care: Advanced Directive information    05/02/2024   12:43 PM  Advanced Directives  Does Patient Have a Medical Advance Directive? Yes  Type of Estate agent of [place];Living will;Out of facility DNR (pink MOST or yellow form)  Does patient want to make changes to medical advance directive? No - Patient declined  Copy of Healthcare Power of Attorney in Chart? Yes - validated most recent copy scanned in chart (See row information)     Chief Complaint  Patient presents with   [facility] follow up    Discussed the use of AI scribe software for clinical note transcription with the patient, who gave verbal consent to proceed.  History of Present Illness   Denise Lane is a 89 year old female who presents for follow-up after a recent hospitalization for a urinary tract infection and possible kidney stone.  She was recently hospitalized for a severe urinary tract infection (UTI) and a possible kidney stone. During her hospital stay, there was uncertainty about the presence of a kidney stone, but it was suspected that she passed it. She was treated with Duracell 500 mg for five days, which was sensitive to E. coli. She missed a dose on Friday but completed the course otherwise. There is concern about whether missing the dose might lead to a recurrence of the UTI. No fever or pain during urination currently, but there is a  decreased urine output and urgency without significant volume.  During her hospitalization, she experienced bradycardia and hypotension, and metoprolol  was discontinued. She was prescribed amlodipine  5 mg with instructions to take it if her systolic blood pressure is greater than 140 mmHg, but she is unable to check her blood pressure daily, which complicates the administration of this medication. However, she is unable to check her blood pressure daily, which complicates the administration of this medication. She continues to take losartan  at night, which she has been on for years.  She experienced severe left-sided pain initially thought to be hip-related, but further investigation revealed vomiting of bile and led to a CT scan that suggested a kidney stone. A urine test later confirmed the presence of E. coli, indicating a UTI. No fever during this episode.  She has a history of hemochromatosis and has been under the care of Dr. Wilmer for phlebotomies. Her hemoglobin was noted to be low during her hospital stay, dropping from 12.2 to 11.4. She has not required phlebotomies for the past year due to stable lab results. A follow-up with Dr. Wilmer is scheduled for October.  She reports a decreased appetite and weight loss, with her weight currently at 101 lbs, down from 106 lbs at her last visit. She has a history of scoliosis, which affects her posture and mobility, causing her to walk bent over. She also experiences confusion and memory issues, particularly with medication management, which her daughter helps manage.  Her current medications include losartan , amlodipine , oximetin, multivitamins  with minerals, vitamin B12, Vicodin (as needed for back pain), Protonix  for acid reflux, Effexor  for anxiety and depression, gabapentin  300 mg at bedtime, and Refresh eye drops for dry eyes.    Past Medical History:  Diagnosis Date   Basal cell cancer    face, legs (10/18/2012)   Bruises easily    Chronic  lower back pain    Depression    takes Effexor  daily   Diverticulosis of sigmoid colon 11-20-2009   Dry eyes    uses Refresh eye drops daily as needed   Dysphagia    Gastric ulcer    GERD (gastroesophageal reflux disease)    takes Omeprazole daily   H/O hiatal hernia    Hemochromatosis 1987   Hemochromatosis    Hemorrhoids, internal 11-20-09   History of blood transfusion    S/P colonoscopy after polyp removed; got 2 units; esophageal bleed got 1 unit; really messed up hemochromatosis (1/7/20214)   History of bronchitis 1991-1996   chronic; related to winter (10/18/2012)   History of colonic polyps    NOTED 11-20-09 IN COLONOSCOPY REPORT   Hyperlipidemia    no meds required   Hypertension    takes Metoprolol  and Losartan  daily   Joint pain    Joint swelling    Muscle pain    Muscle spasm    takes Robaxin  daily as needed   Occasional tremors    Osteoarthritis    Osteoarthritis of right hip 10/18/2012   Osteoporosis    Overactive bladder    takes Myrbetriq  daily   PONV (postoperative nausea and vomiting)    Rheumatic fever    hx of   Squamous carcinoma    LLE (10/18/2012)   Stenosis of esophagus    Tremor, essential 10-29-09   Urinary urgency    takes Ditropan  daily   Vomiting    occasionally   Past Surgical History:  Procedure Laterality Date   APPENDECTOMY  1952   CATARACT EXTRACTION W/ INTRAOCULAR LENS  IMPLANT, BILATERAL  2001   both eyes (10/18/2012)   CHOLECYSTECTOMY  1977   COLONOSCOPY     DILATION AND CURETTAGE OF UTERUS  1975   ESOPHAGOGASTRODUODENOSCOPY     LUMBAR DISC SURGERY  1989   SHOULDER ARTHROSCOPY W/ ROTATOR CUFF REPAIR  1998?   left (10/18/2012)   SKIN CANCER EXCISION     multiple (10/18/2012)   TONSILLECTOMY  1953   TOTAL HIP ARTHROPLASTY  10/18/2012   right (10/18/2012)   TOTAL HIP ARTHROPLASTY  10/18/2012   Procedure: TOTAL HIP ARTHROPLASTY;  Surgeon: Fonda SHAUNNA Olmsted, MD;  Location: [facility] OR;  Service: Orthopedics;  Laterality: Right;   TOTAL  HIP ARTHROPLASTY Left 07/24/2014   dr olmsted   TOTAL HIP ARTHROPLASTY Left 07/24/2014   Procedure: LEFT TOTAL HIP ARTHROPLASTY;  Surgeon: Fonda SHAUNNA Olmsted, MD;  Location: [facility] OR;  Service: Orthopedics;  Laterality: Left;   TUBAL LIGATION  1975    Allergies  Allergen Reactions   Codeine Nausea And Vomiting   Dilaudid [Hydromorphone Hcl] Hives and Other (See Comments)    whelps (10/18/2012)   Macrodantin Hives and Other (See Comments)    drug fever; chills; felt terrible (10/18/2012)   Morphine And Codeine Hives and Nausea And Vomiting   Sulfa Antibiotics Hives, Rash and Other (See Comments)    got real sick (10/18/2012)   Ibuprofen Other (See Comments)    felt like I have to go to the bathroom constantly    Outpatient Encounter Medications as of 05/02/2024  Medication Sig   acetaminophen  (TYLENOL ) 650 MG CR tablet Take 1,300 mg by mouth 2 (two) times daily.   amLODipine  (NORVASC ) 5 MG tablet Take 1 tablet (5 mg total) by mouth daily. Start if Systolic BP above 140 mmHg   carboxymethylcellulose (REFRESH PLUS) 0.5 % SOLN Place 1 drop into both eyes 3 (three) times daily as needed. Uses more frequently in left eye as needed.   cholecalciferol  (VITAMIN D) 1000 UNITS tablet Take 1,000 Units by mouth 2 (two) times daily.   cyanocobalamin  1000 MCG tablet Take 1,000 mcg by mouth daily.   gabapentin  (NEURONTIN ) 300 MG capsule TAKE 1 CAPSULE EVERY DAY (Patient taking differently: Take 300 mg by mouth at bedtime.)   HYDROcodone -acetaminophen  (NORCO/VICODIN) 5-325 MG tablet Take 0.5 tablets by mouth every 12 (twelve) hours as needed for moderate pain (pain score 4-6).   Krill Oil 350 MG CAPS Take 1 capsule by mouth daily.   losartan  (COZAAR ) 50 MG tablet Take 1 tablet (50 mg total) by mouth at bedtime. Skip the dose if SYSTOLIC BP <130   Multiple Vitamins-Minerals (PRESERVISION AREDS 2) CAPS Take 1 capsule by mouth in the morning and at bedtime.   oxybutynin  (DITROPAN ) 5 MG tablet Take 5 mg by  mouth 2 (two) times daily.   pantoprazole  (PROTONIX ) 40 MG tablet TAKE 1 TABLET EVERY DAY   venlafaxine  XR (EFFEXOR -XR) 37.5 MG 24 hr capsule TAKE 1 CAPSULE EVERY DAY WITH BREAKFAST   [DISCONTINUED] diclofenac  (FLECTOR ) 1.3 % PTCH 1 patch 2 (two) times daily. (Patient not taking: Reported on 05/02/2024)   No facility-administered encounter medications on file as of 05/02/2024.    Review of Systems  Constitutional:  Negative for appetite change, chills, fatigue, fever and unexpected weight change.  HENT:  Negative for congestion, ear discharge, ear pain, hearing loss, nosebleeds, postnasal drip, rhinorrhea, sinus pressure, sinus pain, sneezing, sore throat, tinnitus and trouble swallowing.   Eyes:  Negative for pain, discharge, redness, itching and visual disturbance.  Respiratory:  Negative for cough, chest tightness, shortness of breath and wheezing.   Cardiovascular:  Negative for chest pain, palpitations and leg swelling.  Gastrointestinal:  Negative for abdominal distention, abdominal pain, blood in stool, constipation, diarrhea, nausea and vomiting.  Genitourinary:  Negative for difficulty urinating, dysuria, flank pain, frequency and urgency.  Musculoskeletal:  Positive for arthralgias, back pain and gait problem. Negative for joint swelling, myalgias, neck pain and neck stiffness.  Skin:  Negative for color change, pallor, rash and wound.  Neurological:  Negative for dizziness, syncope, speech difficulty, weakness, light-headedness, numbness and headaches.  Hematological:  Does not bruise/bleed easily.  Psychiatric/Behavioral:  Negative for agitation, behavioral problems, confusion, hallucinations and sleep disturbance. The patient is not nervous/anxious.     Immunization History  Administered Date(s) Administered   Fluad Quad(high Dose 65+) 07/03/2019, 06/12/2020   Fluad Trivalent(High Dose 65+) 07/26/2023   Influenza, High Dose Seasonal PF 07/08/2016, 07/18/2017, 06/20/2018,  07/05/2021, 07/11/2022   Influenza-Unspecified 06/12/2012, 07/04/2013, 06/23/2014, 07/09/2015   Moderna Covid-19 Vaccine Bivalent Booster 38yrs & up 08/16/2021   Moderna SARS-COV2 Booster Vaccination 08/07/2020, 03/29/2021   Moderna Sars-Covid-2 Vaccination 11/25/2019, 12/23/2019   Pneumococcal Conjugate-13 05/14/2016   Pneumococcal Polysaccharide-23 07/25/2014   Td 04/19/2007, 05/27/2013   Tdap 05/27/2013   Zoster Recombinant(Shingrix) 05/24/2017, 08/23/2017   Zoster, Live 09/24/2011   Pertinent  Health Maintenance Due  Topic Date Due   INFLUENZA VACCINE  05/12/2024   DEXA SCAN  Completed      07/27/2022    1:44 PM 07/31/2022  1:06 PM 01/29/2023   12:42 PM 07/26/2023    1:01 PM 05/02/2024   12:47 PM  Fall Risk  Falls in the past year? 0 0 1 1 1   Was there an injury with Fall? 0 0 0 0 1  Fall Risk Category Calculator 0 0 2 1 3   Fall Risk Category (Retired) Low  Low      (RETIRED) Patient Fall Risk Level Low fall risk  Low fall risk      Patient at Risk for Falls Due to History of fall(s) No Fall Risks History of fall(s)  History of fall(s)  Fall risk Follow up  Falls evaluation completed  Falls evaluation completed  Falls evaluation completed     Data saved with a previous flowsheet row definition   Functional Status Survey:    Vitals:   05/02/24 1254  BP: 132/74  Pulse: 67  Resp: 18  Temp: 97.8 F (36.6 C)  SpO2: 96%  Weight: 101 lb (45.8 kg)  Height: 5' 3 (1.6 m)   Body mass index is 17.89 kg/m. Physical Exam  MEASUREMENTS: Weight- 101. GENERAL: Alert, cooperative, well developed, no acute distress. HEENT: Normocephalic, normal oropharynx, moist mucous membranes. CHEST: Clear to auscultation bilaterally, no wheezes, rhonchi, or crackles. CARDIOVASCULAR: Normal heart rate and rhythm, S1 and S2 normal without murmurs. ABDOMEN: Soft, non-tender, non-distended, without organomegaly, normal bowel sounds. No costovertebral angle tenderness. EXTREMITIES: No  cyanosis or edema. NEUROLOGICAL: Cranial nerves grossly intact, moves all extremities without gross motor or sensory deficit.      Labs reviewed: Recent Labs    11/22/23 1348 04/23/24 2113 04/25/24 0437  NA 140 136 140  K 3.8 3.7 3.5  CL 102 99 101  CO2 33* 25 30  GLUCOSE 112* 132* 113*  BUN 17 23 11   CREATININE 0.68 0.67 0.71  CALCIUM  10.3 9.5 9.3  MG  --   --  2.1  PHOS  --   --  3.0   Recent Labs    05/07/23 1230 11/22/23 1348  AST 15 18  ALT 12 13  ALKPHOS 79 83  BILITOT 0.6 0.7  PROT 6.5 7.0  ALBUMIN  4.1 4.4   Recent Labs    05/07/23 1230 11/22/23 1348 04/23/24 2113 04/25/24 0437  WBC 6.5 9.0 10.5 8.9  NEUTROABS 3.8 6.7 9.8*  --   HGB 11.8* 12.9 12.2 11.4*  HCT 35.3* 39.0 35.9* 34.4*  MCV 93.9 100.5* 101.1* 102.7*  PLT 259 266 233 217   Lab Results  Component Value Date   TSH 2.06 06/20/2018   No results found for: HGBA1C Lab Results  Component Value Date   CHOL 151 06/04/2017   HDL 37 (L) 06/04/2017   LDLCALC 78 06/04/2017   TRIG 180 (H) 06/04/2017   CHOLHDL 5.5 (H) 07/16/2015    Significant Diagnostic Results in last 30 days:  CT ABDOMEN PELVIS W CONTRAST Result Date: 04/24/2024 CLINICAL DATA:  Left lower quadrant pain EXAM: CT ABDOMEN AND PELVIS WITH CONTRAST TECHNIQUE: Multidetector CT imaging of the abdomen and pelvis was performed using the standard protocol following bolus administration of intravenous contrast. RADIATION DOSE REDUCTION: This exam was performed according to the departmental dose-optimization program which includes automated exposure control, adjustment of the mA and/or kV according to patient size and/or use of iterative reconstruction technique. CONTRAST:  75mL OMNIPAQUE  IOHEXOL  350 MG/ML SOLN COMPARISON:  None Available. FINDINGS: Lower chest: No acute abnormality. Hepatobiliary: No focal liver abnormality is seen. Status post cholecystectomy. No biliary dilatation. Pancreas: Predominately atrophic.  Spleen: Normal in size  without focal abnormality. Adrenals/Urinary Tract: Adrenal glands are within normal limits. Kidneys demonstrate a normal enhancement pattern bilaterally. Nonobstructing 2 mm stone in the lower pole of the left kidney is noted. Mild prominence of the left ureter is seen although the distal aspect is incompletely evaluated due to scatter artifact from the hip placements. There is a density in the expected region of the left ureterovesical junction which may represent a distal ureteral stone. The bladder is decompressed. Stomach/Bowel: Mild diverticular change of the colon is noted. No evidence of diverticulitis is seen. No obstructive changes in the colon are noted. The appendix has been surgically removed. Small bowel and stomach are unremarkable. Vascular/Lymphatic: Aortic atherosclerosis. No enlarged abdominal or pelvic lymph nodes. Reproductive: Uterus is small consistent with the advanced age. No adnexal mass is seen. Other: No abdominal wall hernia or abnormality. No abdominopelvic ascites. Musculoskeletal: Bilateral hip replacements are seen. Degenerative changes of lumbar spine are noted. IMPRESSION: Fullness of the left ureter. Findings suspicious for distal left ureteral stone are noted although evaluation is somewhat limited due to scatter artifact from the known hip replacements. Nonobstructing left renal stone. Diverticulosis without diverticulitis. Electronically Signed   By: Oneil Devonshire M.D.   On: 04/24/2024 02:09   DG Hip Unilat With Pelvis 2-3 Views Left Result Date: 04/23/2024 CLINICAL DATA:  Pain in left hip. EXAM: DG HIP (WITH OR WITHOUT PELVIS) 2-3V LEFT COMPARISON:  AP pelvis 07/24/2014. FINDINGS: Three views with AP pelvis and AP and frog-leg left hip. Generalized osteopenia, interval increased. There is no evidence of left hip fracture or dislocation. No pelvic fracture or diastasis is seen AP. Again noted are bilateral total hip joint replacements without evidence of dislocation or  loosening. There is chronic fracture of the medial aspect of proximal left femoral metaphysis. There is pelvic enthesopathy, spurring at the symphysis and SI joints. Heavy vascular calcifications. IMPRESSION: 1. No evidence of acute fracture or dislocation. 2. Bilateral total hip joint replacements without evidence of dislocation or loosening. 3. Chronic fracture of the medial aspect of proximal left femoral metaphysis. 4. Osteopenia. 5. Heavy vascular calcifications. Electronically Signed   By: Francis Quam M.D.   On: 04/23/2024 21:16    Assessment/Plan Assessment and Plan    Urinary Tract Infection (UTI) with E. coli Recently hospitalized for a severe UTI with E. coli. Treated with Durysta 500 mg for five days, missing two doses but completed the course. No fever or dysuria, but concerns about urinary frequency and volume. The five-day course should suffice despite missed doses. - Monitor for recurrence of UTI symptoms. - Contact urologist if symptoms return or hematuria occurs.  Nephrolithiasis Experiencing urinary frequency with low output, possibly related to recent UTI or suspected kidney stone. Frequent urination in the emergency room may indicate bladder spasms. Advised to maintain hydration despite reluctance due to frequency. - Encourage frequent small sips of water, especially if experiencing xerostomia. - Contact urologist if symptoms recur or hematuria occurs.  Essential Hypertension  Experienced bradycardia and hypotension during hospitalization. Metoprolol  was discontinued, and amlodipine  5 mg was initiated for systolic BP >140 mmHg. Pulse improved, lowest at 55 bpm, generally in the 70s. Infection may have contributed to hypotension, expected to stabilize as infection resolves. - Monitor blood pressure and pulse regularly. - Continue amlodipine  5 mg for systolic BP >140 mmHg. - Continue losartan  at night.  Hemochromatosis with Anemia Hemoglobin decreased from 12.2 to 11.4  during hospitalization. Under Dr. Cristobal care for hemochromatosis, no phlebotomy  needed in the past year. Follow-up with Dr. Wilmer in October. Recheck hemoglobin and WBC count due to recent infection. - Recheck hemoglobin and WBC count today. - Follow up with Dr. Wilmer in October.   Parkinsonian Features  reports right hand tremors at times but has improved. - continue to monitor   Scoliosis with Back Pain Severe left-sided pain initially thought to be hip pain, possibly related to a kidney stone. Severe scoliosis affects posture and neck. Previous fall in March with head bruising. Vicodin 5/325 mg prescribed for back pain, not recently needed. - Continue current pain management as needed.  General Health Maintenance Experiencing xerostomia, managed by sipping water frequently. Hydration is beneficial post-UTI. - Encourage hydration with meals and frequent small sips throughout the day. - Continue Refresh eye drops for dry eyes.  Goals of Care Difficulty adjusting to living alone after the loss of her cat and husband. Experiences confusion with names and medication adherence. Daughter actively involved in care. - Daughter to monitor medication adherence.  Follow-up Follow-up with Dr. Wilmer in October for hemochromatosis and anemia. Recheck blood work today to assess hemoglobin and WBC count post-infection. - Recheck blood work today, including hemoglobin and WBC count. - Follow up with Dr. Wilmer in October. - Contact urologist if urinary symptoms return or hematuria occurs.   Family/ staff Communication: Reviewed plan of care with patient verbalized understanding   Labs/tests ordered:  CBC/diff and CMP with GFR  Next Appointment: Return if symptoms worsen or fail to improve.   Total time: 30 minutes. Greater than 50% of total time spent doing patient education regarding Hospital follow up ,health maintenance including symptom/medication management.   Denise JAYSON Plough, NP

## 2024-05-10 ENCOUNTER — Ambulatory Visit: Payer: Self-pay | Admitting: Family

## 2024-05-11 ENCOUNTER — Telehealth: Payer: Self-pay

## 2024-05-11 NOTE — Telephone Encounter (Signed)
 Yes and I will be attending, thank you

## 2024-05-11 NOTE — Telephone Encounter (Signed)
 Denise Lane is aware that you will be attending also

## 2024-05-11 NOTE — Telephone Encounter (Signed)
 Denise Lane with hospice called stating patient currently receiving palliative care, however they believe hospice care is appropriate at this time. Denise Lane was seeking a verbal order to transition patient from palliative to hospice.  Verbal order authorized per [employer] standing order

## 2024-06-07 ENCOUNTER — Encounter: Payer: Self-pay | Admitting: Hematology

## 2024-07-03 ENCOUNTER — Encounter: Payer: Self-pay | Admitting: Nurse Practitioner

## 2024-07-03 NOTE — Telephone Encounter (Signed)
 Message routed to PCP Caro, Harlene POUR, NP as RICK.

## 2024-07-27 ENCOUNTER — Ambulatory Visit: Admitting: Hematology

## 2024-07-27 ENCOUNTER — Other Ambulatory Visit

## 2024-07-31 ENCOUNTER — Ambulatory Visit: Admitting: Nurse Practitioner

## 2024-08-23 ENCOUNTER — Encounter: Payer: Self-pay | Admitting: Nurse Practitioner

## 2024-08-25 ENCOUNTER — Encounter: Payer: Self-pay | Admitting: Nurse Practitioner

## 2024-08-28 ENCOUNTER — Encounter: Payer: Self-pay | Admitting: Nurse Practitioner

## 2024-08-28 NOTE — Telephone Encounter (Signed)
 Denise Lane to [facility] (supporting Harlene MARLA An, NP)     08/25/24  9:49 AM Harlene, Thank you for the letter but the attorney requires a physician signature( I assume your supervising physician).  If this can be done today I will pick up Monday morning and take to the attorney.   Thanks so much, [name]

## 2024-08-29 NOTE — Telephone Encounter (Signed)
 Called and spoke to [facility] who will come and pick up letter tomorrow

## 2024-09-15 ENCOUNTER — Encounter (HOSPITAL_COMMUNITY): Payer: Self-pay

## 2024-09-15 ENCOUNTER — Emergency Department (HOSPITAL_COMMUNITY)

## 2024-09-15 ENCOUNTER — Inpatient Hospital Stay (HOSPITAL_COMMUNITY)
Admission: EM | Admit: 2024-09-15 | Discharge: 2024-09-19 | DRG: 064 | Disposition: A | Attending: Emergency Medicine | Admitting: Emergency Medicine

## 2024-09-15 ENCOUNTER — Other Ambulatory Visit: Payer: Self-pay

## 2024-09-15 DIAGNOSIS — K5730 Diverticulosis of large intestine without perforation or abscess without bleeding: Secondary | ICD-10-CM | POA: Diagnosis not present

## 2024-09-15 DIAGNOSIS — I700 Atherosclerosis of aorta: Secondary | ICD-10-CM | POA: Diagnosis not present

## 2024-09-15 DIAGNOSIS — I10 Essential (primary) hypertension: Secondary | ICD-10-CM | POA: Diagnosis not present

## 2024-09-15 DIAGNOSIS — F411 Generalized anxiety disorder: Secondary | ICD-10-CM | POA: Insufficient documentation

## 2024-09-15 DIAGNOSIS — I629 Nontraumatic intracranial hemorrhage, unspecified: Secondary | ICD-10-CM

## 2024-09-15 DIAGNOSIS — M4312 Spondylolisthesis, cervical region: Secondary | ICD-10-CM | POA: Diagnosis not present

## 2024-09-15 DIAGNOSIS — S2232XA Fracture of one rib, left side, initial encounter for closed fracture: Secondary | ICD-10-CM

## 2024-09-15 DIAGNOSIS — W19XXXA Unspecified fall, initial encounter: Principal | ICD-10-CM

## 2024-09-15 DIAGNOSIS — E785 Hyperlipidemia, unspecified: Secondary | ICD-10-CM | POA: Diagnosis present

## 2024-09-15 DIAGNOSIS — M47812 Spondylosis without myelopathy or radiculopathy, cervical region: Secondary | ICD-10-CM | POA: Diagnosis not present

## 2024-09-15 DIAGNOSIS — F01C0 Vascular dementia, severe, without behavioral disturbance, psychotic disturbance, mood disturbance, and anxiety: Secondary | ICD-10-CM

## 2024-09-15 DIAGNOSIS — Z043 Encounter for examination and observation following other accident: Secondary | ICD-10-CM | POA: Diagnosis not present

## 2024-09-15 DIAGNOSIS — I6523 Occlusion and stenosis of bilateral carotid arteries: Secondary | ICD-10-CM | POA: Diagnosis not present

## 2024-09-15 DIAGNOSIS — S2242XA Multiple fractures of ribs, left side, initial encounter for closed fracture: Secondary | ICD-10-CM | POA: Diagnosis not present

## 2024-09-15 DIAGNOSIS — F0150 Vascular dementia without behavioral disturbance: Secondary | ICD-10-CM | POA: Insufficient documentation

## 2024-09-15 DIAGNOSIS — S2249XA Multiple fractures of ribs, unspecified side, initial encounter for closed fracture: Secondary | ICD-10-CM

## 2024-09-15 DIAGNOSIS — Z515 Encounter for palliative care: Secondary | ICD-10-CM

## 2024-09-15 DIAGNOSIS — K219 Gastro-esophageal reflux disease without esophagitis: Secondary | ICD-10-CM | POA: Diagnosis present

## 2024-09-15 LAB — CBC WITH DIFFERENTIAL/PLATELET
Abs Immature Granulocytes: 0.03 K/uL (ref 0.00–0.07)
Basophils Absolute: 0 K/uL (ref 0.0–0.1)
Basophils Relative: 0 %
Eosinophils Absolute: 0.1 K/uL (ref 0.0–0.5)
Eosinophils Relative: 1 %
HCT: 35.3 % — ABNORMAL LOW (ref 36.0–46.0)
Hemoglobin: 11.5 g/dL — ABNORMAL LOW (ref 12.0–15.0)
Immature Granulocytes: 0 %
Lymphocytes Relative: 21 %
Lymphs Abs: 1.9 K/uL (ref 0.7–4.0)
MCH: 33.7 pg (ref 26.0–34.0)
MCHC: 32.6 g/dL (ref 30.0–36.0)
MCV: 103.5 fL — ABNORMAL HIGH (ref 80.0–100.0)
Monocytes Absolute: 1 K/uL (ref 0.1–1.0)
Monocytes Relative: 11 %
Neutro Abs: 5.8 K/uL (ref 1.7–7.7)
Neutrophils Relative %: 67 %
Platelets: 243 K/uL (ref 150–400)
RBC: 3.41 MIL/uL — ABNORMAL LOW (ref 3.87–5.11)
RDW: 11.8 % (ref 11.5–15.5)
WBC: 8.8 K/uL (ref 4.0–10.5)
nRBC: 0 % (ref 0.0–0.2)

## 2024-09-15 LAB — COMPREHENSIVE METABOLIC PANEL WITH GFR
ALT: 12 U/L (ref 0–44)
AST: 22 U/L (ref 15–41)
Albumin: 3.2 g/dL — ABNORMAL LOW (ref 3.5–5.0)
Alkaline Phosphatase: 73 U/L (ref 38–126)
Anion gap: 10 (ref 5–15)
BUN: 12 mg/dL (ref 8–23)
CO2: 30 mmol/L (ref 22–32)
Calcium: 9.1 mg/dL (ref 8.9–10.3)
Chloride: 98 mmol/L (ref 98–111)
Creatinine, Ser: 0.64 mg/dL (ref 0.44–1.00)
GFR, Estimated: >60 mL/min (ref >60)
Glucose, Bld: 121 mg/dL — ABNORMAL HIGH (ref 70–99)
Potassium: 3.5 mmol/L (ref 3.5–5.1)
Sodium: 138 mmol/L (ref 135–145)
Total Bilirubin: 0.5 mg/dL (ref 0.0–1.2)
Total Protein: 5.9 g/dL — ABNORMAL LOW (ref 6.5–8.1)

## 2024-09-15 LAB — URINALYSIS, ROUTINE W REFLEX MICROSCOPIC
Bilirubin Urine: NEGATIVE
Glucose, UA: NEGATIVE mg/dL
Hgb urine dipstick: NEGATIVE
Ketones, ur: NEGATIVE mg/dL
Leukocytes,Ua: NEGATIVE
Nitrite: NEGATIVE
Protein, ur: NEGATIVE mg/dL
Specific Gravity, Urine: 1.008 (ref 1.005–1.030)
pH: 7 (ref 5.0–8.0)

## 2024-09-15 LAB — MAGNESIUM: Magnesium: 2 mg/dL (ref 1.7–2.4)

## 2024-09-15 MED ORDER — IBUPROFEN 800 MG PO TABS
800.0000 mg | ORAL_TABLET | Freq: Once | ORAL | Status: AC
  Administered 2024-09-15: 800 mg via ORAL
  Filled 2024-09-15: qty 1

## 2024-09-15 MED ORDER — ACETAMINOPHEN 500 MG PO TABS
1000.0000 mg | ORAL_TABLET | Freq: Once | ORAL | Status: AC
  Administered 2024-09-15: 1000 mg via ORAL
  Filled 2024-09-15: qty 2

## 2024-09-15 MED ORDER — LIDOCAINE 5 % EX PTCH
1.0000 | MEDICATED_PATCH | CUTANEOUS | Status: DC
  Administered 2024-09-15 – 2024-09-18 (×4): 1 via TRANSDERMAL
  Filled 2024-09-15 (×4): qty 1

## 2024-09-15 NOTE — ED Triage Notes (Signed)
 Pt BIB  EMS from home for fall and rib pain. Pt lives at home by herself, daughter has ring cameras in the house and saw pt fall at 1430 this afternoon. Pt complaining of L side rib pain, pt is unsure if she lost consciousness and does not remember falling. Pt states she has problems with her knee that causes her to fall. VSS per EMS, [facility].

## 2024-09-15 NOTE — ED Provider Notes (Signed)
 [place] EMERGENCY DEPARTMENT AT [facility] Provider Note   CSN: [id number] Arrival date & time: 09/15/24  8166     Patient presents with: Denise Lane CHRISTELLA Denise Lane is a 89 y.o. female. Hx of hyper tension, hyperlipidemia, hemochromatosis, nephrolithiasis, arthritis, and GERD, currently in hospice care w/ DNR/DNI presenting s/p fall today.  History per patient, EMS.  Per report, patient lives alone, however her daughter has cameras all around her house to keep an eye on her.  Today, patient attempted to stand up, and attempted to walk without her cane.  She reportedly fell onto her left side.  This was witnessed on the ring camera.  No reported LOC, no episodes of vomiting.  Patient endorses that she remembers the whole event.  Endorsing left-sided rib pain on presentation.  Denies headache or neck pain.  Also endorses chronic pain to numerous joints secondary to history of arthritis.  She states that she has had the pain in her joints for many years.  Is overall given her answer most questions, however uncertain if she is in hospice or why she would be in hospice care.   HPI     Prior to Admission medications   Medication Sig Start Date End Date Taking? Authorizing Provider  acetaminophen  (TYLENOL ) 650 MG CR tablet Take 1,300 mg by mouth 2 (two) times daily.    [provider]  amLODipine  (NORVASC ) 5 MG tablet Take 1 tablet (5 mg total) by mouth daily. Start if Systolic BP above 140 mmHg 04/26/24 04/26/25  Von Bellis, MD  carboxymethylcellulose (REFRESH PLUS) 0.5 % SOLN Place 1 drop into both eyes 3 (three) times daily as needed. Uses more frequently in left eye as needed.    [provider]  cholecalciferol  (VITAMIN D) 1000 UNITS tablet Take 1,000 Units by mouth 2 (two) times daily. 10/17/12   [provider]  cyanocobalamin  1000 MCG tablet Take 1,000 mcg by mouth daily.    [provider]  gabapentin  (NEURONTIN ) 300 MG capsule TAKE 1 CAPSULE  EVERY DAY Patient taking differently: Take 300 mg by mouth at bedtime. 03/31/23   Eubanks, Jessica K, NP  HYDROcodone -acetaminophen  (NORCO/VICODIN) 5-325 MG tablet Take 0.5 tablets by mouth every 12 (twelve) hours as needed for moderate pain (pain score 4-6). 01/24/24   [name], NP  Krill Oil 350 MG CAPS Take 1 capsule by mouth daily.    [provider]  losartan  (COZAAR ) 50 MG tablet Take 1 tablet (50 mg total) by mouth at bedtime. Skip the dose if SYSTOLIC BP <130 2/84/74   Von Bellis, MD  Multiple Vitamins-Minerals (PRESERVISION AREDS 2) CAPS Take 1 capsule by mouth in the morning and at bedtime.    [provider]  oxybutynin  (DITROPAN ) 5 MG tablet Take 5 mg by mouth 2 (two) times daily.    [provider]  pantoprazole  (PROTONIX ) 40 MG tablet TAKE 1 TABLET EVERY DAY 07/16/23   Eubanks, Jessica K, NP  venlafaxine  XR (EFFEXOR -XR) 37.5 MG 24 hr capsule TAKE 1 CAPSULE EVERY DAY WITH BREAKFAST 06/15/23   Eubanks, Jessica K, NP    Allergies: Codeine, Dilaudid [hydromorphone hcl], Macrodantin, Morphine and codeine, Sulfa antibiotics, and Ibuprofen     Review of Systems  Updated Vital Signs BP (!) 145/60   Pulse 69   Temp 98.1 F (36.7 C) (Oral)   Resp 18   SpO2 98%   Physical Exam Vitals and nursing note reviewed.  Constitutional:      General: She is  not in acute distress.    Appearance: She is well-developed.  HENT:     Head: Normocephalic and atraumatic.     Mouth/Throat:     Mouth: Mucous membranes are moist.     Pharynx: Oropharynx is clear.  Eyes:     Extraocular Movements: Extraocular movements intact.     Conjunctiva/sclera: Conjunctivae normal.     Pupils: Pupils are equal, round, and reactive to light.  Cardiovascular:     Rate and Rhythm: Normal rate and regular rhythm.     Pulses: Normal pulses.     Heart sounds: Normal heart sounds. No murmur heard. Pulmonary:     Effort: Pulmonary effort is normal. No respiratory distress.      Breath sounds: Normal breath sounds. No wheezing or rales.  Chest:     Chest wall: Tenderness (Mild TTP left lateral lower ribs.) present.  Abdominal:     Palpations: Abdomen is soft.     Tenderness: There is no abdominal tenderness.  Musculoskeletal:        General: No swelling, tenderness, deformity or signs of injury. Normal range of motion.     Cervical back: Normal range of motion and neck supple. No rigidity.     Comments: Full ROM of bilateral shoulders, elbows, wrist, and hands.  Excellent strength in bilateral upper and lower extremities.  Full ROM bilateral hips, knees, ankles.  Crepitus is appreciated throughout primarily all of these joints with range of motion secondary to history of arthritis.  No palpable step-off, no deformities appreciated.  No C/T/L-spine TTP.  Skin:    General: Skin is warm and dry.     Capillary Refill: Capillary refill takes less than 2 seconds.  Neurological:     General: No focal deficit present.     Mental Status: She is alert and oriented to person, place, and time.     Cranial Nerves: No cranial nerve deficit.     Sensory: No sensory deficit.     Motor: No weakness.  Psychiatric:        Mood and Affect: Mood normal.     (all labs ordered are listed, but only abnormal results are displayed) Labs Reviewed  CBC WITH DIFFERENTIAL/PLATELET - Abnormal; Notable for the following components:      Result Value   RBC 3.41 (*)    Hemoglobin 11.5 (*)    HCT 35.3 (*)    MCV 103.5 (*)    All other components within normal limits  COMPREHENSIVE METABOLIC PANEL WITH GFR - Abnormal; Notable for the following components:   Glucose, Bld 121 (*)    Total Protein 5.9 (*)    Albumin  3.2 (*)    All other components within normal limits  URINALYSIS, ROUTINE W REFLEX MICROSCOPIC - Abnormal; Notable for the following components:   Color, Urine STRAW (*)    All other components within normal limits  MAGNESIUM     EKG: None  Radiology: CT Head Wo  Contrast Addendum Date: 09/15/2024 ** ADDENDUM #1 ** ADDENDUM: These findings were discussed with doctor Belfi by Dr. Margarite over the phone on 09/15/2024 at 08:30 pm ---------------------------------------------------- Electronically signed by: Morgane Naveau MD 09/15/2024 08:30 PM EST RP Workstation: [id number]   Result Date: 09/15/2024 ** ORIGINAL REPORT ** EXAM: CT HEAD WITHOUT CONTRAST 09/15/2024 08:11:00 PM TECHNIQUE: CT of the head was performed without the administration of intravenous contrast. Automated exposure control, iterative reconstruction, and/or weight based adjustment of the mA/kV was utilized to reduce the radiation dose to as low as  reasonably achievable. COMPARISON: CT head 06:27:15 CLINICAL HISTORY: fall FINDINGS: BRAIN AND VENTRICLES: 7 mm hyperdensity of the left cerebellar hemisphere (3:8). May represent a mass versus hemorrhage. Cerebral ventricle sizes are concordant with the degree of cerebral volume loss. Patchy and confluent areas of decreased attenuation are noted throughout the deep and periventricular white matter of the cerebral hemispheres bilaterally suggestive of chronic microvascular ischemic changes. Question bilateral basal ganglia loss of gray-white matter differentiation. Atherosclerotic calcifications are present within the cavernous internal carotid and vertebral arteries. No extra-axial collection. No mass effect or midline shift. ORBITS: No acute abnormality. SINUSES: No acute abnormality. SOFT TISSUES AND SKULL: No acute soft tissue abnormality. No skull fracture. IMPRESSION: 1. A 7 mm hyperdensity in the left cerebellar hemisphere, possibly representing a mass versus hemorrhage. 2. Question bilateral basal ganglia loss of gray-white matter differentiation. Findings may represent chronic severe microvascular ischemic changes but cannot exclude other etiologies such as acute infarction or other causes of edema. 3. Recommend MRI head with and without contrast for  further evaluation. Electronically signed by: Morgane Naveau MD 09/15/2024 08:25 PM EST RP Workstation: [id number]   CT Cervical Spine Wo Contrast Result Date: 09/15/2024 EXAM: CT CERVICAL SPINE WITHOUT CONTRAST 09/15/2024 08:11:00 PM TECHNIQUE: CT of the cervical spine was performed without the administration of intravenous contrast. Multiplanar reformatted images are provided for review. Automated exposure control, iterative reconstruction, and/or weight based adjustment of the mA/kV was utilized to reduce the radiation dose to as low as reasonably achievable. COMPARISON: None available. CLINICAL HISTORY: fall fall FINDINGS: CERVICAL SPINE: BONES AND ALIGNMENT: Diffusely decreased bone density. Grade 1 anterolisthesis of C4 on C5, C5 on C6, C6 on C7, C7 on T1, T1 on T2. No acute fracture or traumatic malalignment. DEGENERATIVE CHANGES: Multilevel moderate-to-severe degenerative changes of the spine. Question associated severe osseous neural foraminal stenosis of the right C5-C6 level. No severe osseous central canal stenosis. SOFT TISSUES: No prevertebral soft tissue swelling. Atherosclerotic plaque of the carotid arteries within the neck. LUNGS: Biapical marked pleural and pulmonary scarring. Likely mild centrilobular emphysematous changes. HEPATOBILIARY: Trace hepatic bile duct wall thickening. IMPRESSION: 1. No acute abnormality of the cervical spine related to the fall. Electronically signed by: Morgane Naveau MD 09/15/2024 08:28 PM EST RP Workstation: [id number]   DG Chest Portable 1 View Result Date: 09/15/2024 CLINICAL DATA:  Clemens, rib pain EXAM: PORTABLE CHEST 1 VIEW COMPARISON:  07/18/2014 FINDINGS: Single frontal view of the chest demonstrates an unremarkable cardiac silhouette. No acute airspace disease, effusion, or pneumothorax. There is a minimally displaced left lateral seventh rib fracture, partially obscured by an overlying cardiac lead. No other acute bony abnormalities. IMPRESSION: 1.  Minimally displaced left lateral seventh rib fracture. 2. Otherwise no acute intrathoracic process. Electronically Signed   By: Ozell Daring M.D.   On: 09/15/2024 19:47     Procedures   Medications Ordered in the ED  lidocaine  (LIDODERM ) 5 % 1 patch (1 patch Transdermal Patch Applied 09/15/24 2047)  ibuprofen  (ADVIL ) tablet 800 mg (800 mg Oral Given 09/15/24 2048)  acetaminophen  (TYLENOL ) tablet 1,000 mg (1,000 mg Oral Given 09/15/24 2048)  gadobutrol  (GADAVIST ) 1 MMOL/ML injection 5 mL (5 mLs Intravenous Contrast Given 09/15/24 2359)    Clinical Course as of 09/16/24 0029  Fri Sep 15, 2024  2237 Pending MRI [BS]    Clinical Course User Index [BS] Arlee Katz, MD  Medical Decision Making Amount and/or Complexity of Data Reviewed Labs: ordered. Radiology: ordered.  Risk OTC drugs. Prescription drug management.   Based on patient presentation, history, evaluation, high suspicion for fall, with associated dementia with microvascular disease, as well as possible small mass versus hemorrhage within the cerebellum, as well as at the left lateral rib fracture with overlying seventh rib.  Patient without evidence of C-spine injury, low suspicion.  Overall, we discussed with the patient's daughter, who is also the power of attorney.  She is overall concerned about the patient's presentation, and while patient is currently on hospice care, they would not want to undergo any significant interventions, information concerning patient's presentation and findings, would be able to provide history to the family.  Patient is currently living at home, and has hospice this at home, however includes benefit from inpatient hospice facility with plan for end-of-life care.  Patient's family is requesting a complete evaluation at this time, without finding any surgical interventions or other significant interventions based on the findings.  Overall, we will perform chest  abdomen pelvis CT, as well as MRI of the brain to better appreciate findings, at this time pending both of these imaging.  Low suspicion for UTI versus anemia (hemoglobin stable), AKI, or significant electrolyte abnormalities.  Hemodynamically stable throughout time in the ED.     Final diagnoses:  Fall, initial encounter  Closed fracture of one rib of left side, initial encounter  Severe vascular dementia, unspecified whether behavioral, psychotic, or mood disturbance or anxiety [facility])  Hospice care patient    ED Discharge Orders     None          Arlee Katz, MD 09/16/24 9970    Lenor Hollering, MD 09/25/24 1409

## 2024-09-15 NOTE — ED Provider Notes (Signed)
  Provider Note MRN:  [id number]  Arrival date & time: 09/16/24    ED Course and Medical Decision Making  Assumed care of patient at sign-out or upon transfer.  Fall, possible intracranial bleeding awaiting MRI.  Also with evidence of a rib fracture.  Patient is DNR/DNI hospice, general plan would be for discharge if possible.  2:30 AM update: Imaging revealing 4+ rib fractures, also area of bleeding within the cerebellum, question vascular malformation versus metastatic lesion.  Daughter is no longer comfortable bringing her home, requesting hospitalist admission.  Procedures  Final Clinical Impressions(s) / ED Diagnoses     ICD-10-CM   1. Fall, initial encounter  W19.XXXA     2. Closed fracture of one rib of left side, initial encounter  S22.32XA     3. Severe vascular dementia, unspecified whether behavioral, psychotic, or mood disturbance or anxiety (HCC)  F01.C0     4. Hospice care patient  Z51.5       ED Discharge Orders     None       Discharge Instructions   None     Ozell HERO. Theadore, MD [facility] Emergency Medicine [facility] [email or web address]    Theadore Ozell HERO, MD 09/16/24 [phone]

## 2024-09-16 ENCOUNTER — Emergency Department (HOSPITAL_COMMUNITY)

## 2024-09-16 ENCOUNTER — Encounter (HOSPITAL_COMMUNITY): Payer: Self-pay | Admitting: Internal Medicine

## 2024-09-16 DIAGNOSIS — N3281 Overactive bladder: Secondary | ICD-10-CM | POA: Diagnosis present

## 2024-09-16 DIAGNOSIS — F01C4 Vascular dementia, severe, with anxiety: Secondary | ICD-10-CM | POA: Diagnosis present

## 2024-09-16 DIAGNOSIS — I629 Nontraumatic intracranial hemorrhage, unspecified: Secondary | ICD-10-CM | POA: Diagnosis not present

## 2024-09-16 DIAGNOSIS — Z66 Do not resuscitate: Secondary | ICD-10-CM | POA: Diagnosis present

## 2024-09-16 DIAGNOSIS — K219 Gastro-esophageal reflux disease without esophagitis: Secondary | ICD-10-CM | POA: Diagnosis present

## 2024-09-16 DIAGNOSIS — D1802 Hemangioma of intracranial structures: Secondary | ICD-10-CM | POA: Diagnosis present

## 2024-09-16 DIAGNOSIS — Z515 Encounter for palliative care: Secondary | ICD-10-CM | POA: Diagnosis not present

## 2024-09-16 DIAGNOSIS — Z885 Allergy status to narcotic agent status: Secondary | ICD-10-CM | POA: Diagnosis not present

## 2024-09-16 DIAGNOSIS — Z882 Allergy status to sulfonamides status: Secondary | ICD-10-CM | POA: Diagnosis not present

## 2024-09-16 DIAGNOSIS — M4312 Spondylolisthesis, cervical region: Secondary | ICD-10-CM | POA: Diagnosis not present

## 2024-09-16 DIAGNOSIS — F411 Generalized anxiety disorder: Secondary | ICD-10-CM | POA: Insufficient documentation

## 2024-09-16 DIAGNOSIS — Z8249 Family history of ischemic heart disease and other diseases of the circulatory system: Secondary | ICD-10-CM | POA: Diagnosis not present

## 2024-09-16 DIAGNOSIS — Z886 Allergy status to analgesic agent status: Secondary | ICD-10-CM | POA: Diagnosis not present

## 2024-09-16 DIAGNOSIS — Z85828 Personal history of other malignant neoplasm of skin: Secondary | ICD-10-CM | POA: Diagnosis not present

## 2024-09-16 DIAGNOSIS — K5730 Diverticulosis of large intestine without perforation or abscess without bleeding: Secondary | ICD-10-CM | POA: Diagnosis not present

## 2024-09-16 DIAGNOSIS — S2242XA Multiple fractures of ribs, left side, initial encounter for closed fracture: Secondary | ICD-10-CM | POA: Diagnosis not present

## 2024-09-16 DIAGNOSIS — I2510 Atherosclerotic heart disease of native coronary artery without angina pectoris: Secondary | ICD-10-CM | POA: Diagnosis present

## 2024-09-16 DIAGNOSIS — I10 Essential (primary) hypertension: Secondary | ICD-10-CM | POA: Diagnosis not present

## 2024-09-16 DIAGNOSIS — G936 Cerebral edema: Secondary | ICD-10-CM | POA: Diagnosis not present

## 2024-09-16 DIAGNOSIS — I700 Atherosclerosis of aorta: Secondary | ICD-10-CM | POA: Diagnosis not present

## 2024-09-16 DIAGNOSIS — Z043 Encounter for examination and observation following other accident: Secondary | ICD-10-CM | POA: Diagnosis not present

## 2024-09-16 DIAGNOSIS — R29818 Other symptoms and signs involving the nervous system: Secondary | ICD-10-CM | POA: Diagnosis not present

## 2024-09-16 DIAGNOSIS — W19XXXA Unspecified fall, initial encounter: Secondary | ICD-10-CM | POA: Diagnosis present

## 2024-09-16 DIAGNOSIS — Z79899 Other long term (current) drug therapy: Secondary | ICD-10-CM | POA: Diagnosis not present

## 2024-09-16 DIAGNOSIS — I6782 Cerebral ischemia: Secondary | ICD-10-CM | POA: Diagnosis not present

## 2024-09-16 DIAGNOSIS — M47812 Spondylosis without myelopathy or radiculopathy, cervical region: Secondary | ICD-10-CM | POA: Diagnosis not present

## 2024-09-16 DIAGNOSIS — I6523 Occlusion and stenosis of bilateral carotid arteries: Secondary | ICD-10-CM | POA: Diagnosis not present

## 2024-09-16 DIAGNOSIS — Z96642 Presence of left artificial hip joint: Secondary | ICD-10-CM | POA: Diagnosis present

## 2024-09-16 DIAGNOSIS — Z23 Encounter for immunization: Secondary | ICD-10-CM | POA: Diagnosis not present

## 2024-09-16 DIAGNOSIS — E785 Hyperlipidemia, unspecified: Secondary | ICD-10-CM | POA: Diagnosis present

## 2024-09-16 DIAGNOSIS — Z888 Allergy status to other drugs, medicaments and biological substances status: Secondary | ICD-10-CM | POA: Diagnosis not present

## 2024-09-16 DIAGNOSIS — M159 Polyosteoarthritis, unspecified: Secondary | ICD-10-CM | POA: Diagnosis present

## 2024-09-16 DIAGNOSIS — F0150 Vascular dementia without behavioral disturbance: Secondary | ICD-10-CM | POA: Insufficient documentation

## 2024-09-16 DIAGNOSIS — S2249XA Multiple fractures of ribs, unspecified side, initial encounter for closed fracture: Secondary | ICD-10-CM

## 2024-09-16 LAB — CBC
HCT: 35 % — ABNORMAL LOW (ref 36.0–46.0)
Hemoglobin: 11.7 g/dL — ABNORMAL LOW (ref 12.0–15.0)
MCH: 33.9 pg (ref 26.0–34.0)
MCHC: 33.4 g/dL (ref 30.0–36.0)
MCV: 101.4 fL — ABNORMAL HIGH (ref 80.0–100.0)
Platelets: 231 K/uL (ref 150–400)
RBC: 3.45 MIL/uL — ABNORMAL LOW (ref 3.87–5.11)
RDW: 11.8 % (ref 11.5–15.5)
WBC: 6 K/uL (ref 4.0–10.5)
nRBC: 0 % (ref 0.0–0.2)

## 2024-09-16 LAB — BASIC METABOLIC PANEL WITH GFR
Anion gap: 11 (ref 5–15)
BUN: 11 mg/dL (ref 8–23)
CO2: 31 mmol/L (ref 22–32)
Calcium: 8.6 mg/dL — ABNORMAL LOW (ref 8.9–10.3)
Chloride: 97 mmol/L — ABNORMAL LOW (ref 98–111)
Creatinine, Ser: 0.7 mg/dL (ref 0.44–1.00)
GFR, Estimated: >60 mL/min (ref >60)
Glucose, Bld: 111 mg/dL — ABNORMAL HIGH (ref 70–99)
Potassium: 3.6 mmol/L (ref 3.5–5.1)
Sodium: 139 mmol/L (ref 135–145)

## 2024-09-16 MED ORDER — IOHEXOL 350 MG/ML SOLN
75.0000 mL | Freq: Once | INTRAVENOUS | Status: AC | PRN
  Administered 2024-09-16: 75 mL via INTRAVENOUS

## 2024-09-16 MED ORDER — AMLODIPINE BESYLATE 5 MG PO TABS
5.0000 mg | ORAL_TABLET | Freq: Every day | ORAL | Status: DC
  Administered 2024-09-16 – 2024-09-19 (×4): 5 mg via ORAL
  Filled 2024-09-16 (×4): qty 1

## 2024-09-16 MED ORDER — OXYCODONE HCL 5 MG PO TABS
5.0000 mg | ORAL_TABLET | Freq: Four times a day (QID) | ORAL | Status: DC | PRN
  Administered 2024-09-16 – 2024-09-18 (×6): 5 mg via ORAL
  Filled 2024-09-16 (×7): qty 1

## 2024-09-16 MED ORDER — FENTANYL CITRATE (PF) 50 MCG/ML IJ SOSY: 12.5000 ug | PREFILLED_SYRINGE | INTRAMUSCULAR | Status: DC | PRN

## 2024-09-16 MED ORDER — SODIUM CHLORIDE 0.9 % IV SOLN: 250.0000 mL | INTRAVENOUS | Status: AC | PRN

## 2024-09-16 MED ORDER — LIDOCAINE 5 % EX PTCH: 1.0000 | MEDICATED_PATCH | CUTANEOUS | Status: DC

## 2024-09-16 MED ORDER — ONDANSETRON HCL 4 MG PO TABS: 4.0000 mg | ORAL_TABLET | Freq: Four times a day (QID) | ORAL | Status: DC | PRN

## 2024-09-16 MED ORDER — ACETAMINOPHEN 325 MG PO TABS
650.0000 mg | ORAL_TABLET | Freq: Four times a day (QID) | ORAL | Status: DC | PRN
  Administered 2024-09-16 (×2): 650 mg via ORAL
  Filled 2024-09-16 (×2): qty 2

## 2024-09-16 MED ORDER — VENLAFAXINE HCL ER 37.5 MG PO CP24
37.5000 mg | ORAL_CAPSULE | Freq: Every day | ORAL | Status: DC
  Administered 2024-09-16 – 2024-09-19 (×4): 37.5 mg via ORAL
  Filled 2024-09-16 (×4): qty 1

## 2024-09-16 MED ORDER — FENTANYL CITRATE (PF) 50 MCG/ML IJ SOSY
12.5000 ug | PREFILLED_SYRINGE | INTRAMUSCULAR | Status: DC | PRN
  Administered 2024-09-17: 12.5 ug via INTRAVENOUS
  Filled 2024-09-16: qty 1

## 2024-09-16 MED ORDER — GADOBUTROL 1 MMOL/ML IV SOLN
5.0000 mL | Freq: Once | INTRAVENOUS | Status: AC | PRN
  Administered 2024-09-15: 5 mL via INTRAVENOUS

## 2024-09-16 MED ORDER — ONDANSETRON HCL 4 MG/2ML IJ SOLN
4.0000 mg | Freq: Four times a day (QID) | INTRAMUSCULAR | Status: DC | PRN
  Administered 2024-09-17: 4 mg via INTRAVENOUS
  Filled 2024-09-16: qty 2

## 2024-09-16 MED ORDER — SODIUM CHLORIDE 0.9% FLUSH: 3.0000 mL | INTRAVENOUS | Status: DC | PRN

## 2024-09-16 MED ORDER — ACETAMINOPHEN 650 MG RE SUPP: 650.0000 mg | Freq: Four times a day (QID) | RECTAL | Status: DC | PRN

## 2024-09-16 MED ORDER — LOSARTAN POTASSIUM 50 MG PO TABS
50.0000 mg | ORAL_TABLET | Freq: Every day | ORAL | Status: DC
  Administered 2024-09-16 – 2024-09-18 (×3): 50 mg via ORAL
  Filled 2024-09-16 (×3): qty 1

## 2024-09-16 MED ORDER — PANTOPRAZOLE SODIUM 40 MG PO TBEC
40.0000 mg | DELAYED_RELEASE_TABLET | Freq: Every day | ORAL | Status: DC
  Administered 2024-09-16 – 2024-09-19 (×4): 40 mg via ORAL
  Filled 2024-09-16 (×4): qty 1

## 2024-09-16 MED ORDER — INFLUENZA VAC SPLIT HIGH-DOSE 0.5 ML IM SUSY
0.5000 mL | PREFILLED_SYRINGE | INTRAMUSCULAR | Status: DC
  Filled 2024-09-16: qty 0.5

## 2024-09-16 MED ORDER — SODIUM CHLORIDE 0.9% FLUSH
3.0000 mL | Freq: Two times a day (BID) | INTRAVENOUS | Status: DC
  Administered 2024-09-16 – 2024-09-19 (×7): 3 mL via INTRAVENOUS

## 2024-09-16 MED ORDER — ACETAMINOPHEN 325 MG PO TABS: 650.0000 mg | ORAL_TABLET | Freq: Four times a day (QID) | ORAL | Status: DC | PRN

## 2024-09-16 NOTE — Progress Notes (Signed)
 Brief progress note: - Patient was admitted earlier today.  - As per H&P done on presentation: Denise Lane is a 89 y.o. female with medical history significant of vascular dementia, essential hypertension, hyperlipidemia, hemochromatosis, nephrolithiasis, arthritis and GERD presented to emergency department for evaluation for fall. History per patient and EMS patient lives alone however per daughter to the camera all around her house to keep an eye on her.  Patient attempted to stand up attempted to walk without her cane reported fall on the left side hip.  This was witnessed on the camera.  No loss of consciousness.  Since the fall patient has left-sided chest wall pain.  Denies any headache. Patient is DNR/DNI.   ED Course:  At presentation to ED patient was hypertensive and eventually blood pressure has been improved.  Hemodynamically stable.   Imaging, MRI of the brain showing: 1. Findings compatible with acute/recent subcentimeter hemorrhages in the left cerebellum and left thalamus with surrounding edema. These could be due to underling cavernous malformations (favored given characteristic T2 hypointensity) or hypertensive hemorrhages given evidence of other prior microhemorrhages, but given the atypical central enhancement hemorrhagic metastases is not excluded. Recommend a follow up MRI with contrast in 4-6 weeks. 2. Moderate to advanced chronic microvascular ischemic change.     CT chest abdomen pelvis: 1. Acute displaced comminuted left anterior seventh, eighth, ninth, and tenth rib fractures with possible acute nondisplaced posterior left ninth rib fracture. 2. Severe atherosclerotic plaque of the aorta with aortic valve leaflet and multivessel coronary artery calcifications. 3. Colonic diverticulosis without evidence of diverticulitis. 4. Other, non-acute and/or normal findings as above.   CT head: 1. Acute displaced comminuted left anterior seventh, eighth, ninth, and  tenth rib fractures with possible acute nondisplaced posterior left ninth rib fracture. 2. Severe atherosclerotic plaque of the aorta with aortic valve leaflet and multivessel coronary artery calcifications. 3. Colonic diverticulosis without evidence of diverticulitis. 4. Other, non-acute and/or normal findings as above.   Lab work, CBC unremarkable. CMP unremarkable.  Normal mag level.  UA no evidence of UTI.   I have discussed case with on-call neurosurgery Dr. Meryl reviewed the imaging and stated that patient possibly has intracranial bleeding vs cavernous hemangioma however it is very difficult to differentiate.  Per neurosurgery at this time there is no need for repeat imaging or hypertonic saline, neurosurgery will evaluate patient in the morning. ED physician spoke with general surgery Dr. Glennis recommended pain control.   In the ED patient received Tylenol  1000 mg, ibuprofen  100 mg and lidocaine  patch has been applied.   Hospitalist consulted for further evaluation management of intracranial bleeding and multiple rib fractures secondary from fall.  09/16/2024: Patient seen alongside patient's nurse.  Pain is controlled.  No significant history from patient.  Patient is not in any distress.  As per neurosurgery note, family may be considering hospice input.  Examination: Temperature of 98.4 F, heart rate of 57 bpm, respiratory rate of 12, blood pressure of 130/64 mmHg and O2 sat of 98%. General condition: Not in any distress.  Awake and alert. Neck: Supple. CVS: S1-S2, systolic murmur with ectopic beats. Neuro: Awake and alert. Extremities: No leg edema. Lungs: Clear to auscultation. Abdomen: Soft, nontender.

## 2024-09-16 NOTE — Progress Notes (Signed)
 Denise Lane [id number] [facility] Liaison Note:   Denise Lane is a current hospice patient with [facility] with a terminal diagnosis of Malnutrition. Family alerted [facility] that patient  transported by [facility] from home for fall and rib pain. Pt lives at home by herself, daughter has ring cameras in the house and saw patient fall on the afternoon of 12.5.25. Patient was complaining of left side rib pain. Patient is admitted for Intracranial bleeding versus cavernous hemangioma and Multiple closed rib fracture. Per Dr. Deane Finder with [facility] this is a related hospital admission. Patient is a DNR.   Patient seen at bedside with daughter Denise Lane present. Per [name], no surgical interventions will be done regarding bleed. States that per MD, there was a bleed previously and it is unclear if this is the reason that patient had fall. Daughter reports working with Bonna Lent to get some things in order to apply for Medicare. Will take additional forms on Monday. Daughter would like patient to transition to [place]. [facility] Social Worker is assisting with the process.       Per chart review, patient remains GIP appropriate as she awaits neurosurgery and trauma surgery consult and recommendations regarding management of bleed and have pain managed.     Vital Signs: 98.0/66/12    162/64  97% on room air    I/O: 10/400   Abnormal Labs:  09/15/24 21:48 Comprehensive metabolic panel with GFR: Rpt ! Glucose: 121 (H) Albumin : 3.2 (L) Total Protein: 5.9 (L) RBC: 3.41 (L) Hemoglobin: 11.5 (L) HCT: 35.3 (L) MCV: 103.5 (H)  09/16/24 07:17 Basic metabolic panel with GFR: Rpt ! Chloride: 97 (L) Glucose: 111 (H) Calcium : 8.6 (L) RBC: 3.45 (L) Hemoglobin: 11.7 (L) HCT: 35.0 (L) MCV: 101.4 (H)    Diagnostics:  DG Chest Portable 1 View   Date: 09/15/2024    Narrative & Impression CLINICAL DATA:  Clemens, rib pain   EXAM: PORTABLE CHEST 1 VIEW   COMPARISON:   07/18/2014   FINDINGS: Single frontal view of the chest demonstrates an unremarkable cardiac silhouette. No acute airspace disease, effusion, or pneumothorax. There is a minimally displaced left lateral seventh rib fracture, partially obscured by an overlying cardiac lead. No other acute bony abnormalities.   IMPRESSION: 1. Minimally displaced left lateral seventh rib fracture. 2. Otherwise no acute intrathoracic process.     Electronically Signed   By: Ozell Daring M.D.   On: 09/15/2024 19:47   CT Head Wo Contrast   Date: 09/15/2024    EXAM: CT CERVICAL SPINE WITHOUT CONTRAST 09/15/2024 08:11:00 PM  CLINICAL HISTORY: fall   IMPRESSION: 1. A 7 mm hyperdensity in the left cerebellar hemisphere, possibly representing a mass versus hemorrhage. 2. Question bilateral basal ganglia loss of gray-white matter differentiation. Findings may represent chronic severe microvascular ischemic changes but cannot exclude other etiologies such as acute infarction or other causes of edema. 3. Recommend MRI head with and without contrast for further evaluation.   Electronically signed by: Morgane Naveau MD 09/15/2024 08:25 PM EST RP Workstation: [id number]    CT Cervical Spine Wo Contrast   Date: 09/15/2024     COMPARISON: None available.   CLINICAL HISTORY: fall    IMPRESSION: 1. No acute abnormality of the cervical spine related to the fall.   Electronically signed by: Morgane Naveau MD 09/15/2024 08:28 PM EST RP Workstation: [id number]  MR Brain W and Wo Contrast  09/16/2024 12:13:38 AM     COMPARISON: CT Head earlier today  CLINICAL HISTORY: Neuro deficit, acute, stroke suspected    IMPRESSION: 1. Findings compatible with acute/recent subcentimeter hemorrhages in the left cerebellum and left thalamus with surrounding edema. These could be due to underling cavernous malformations (favored given characteristic T2 hypointensity) or hypertensive hemorrhages given  evidence of other prior microhemorrhages, but given the atypical central enhancement hemorrhagic metastases is not excluded. Recommend a follow up MRI with contrast in 4-6 weeks. 2. Moderate to advanced chronic microvascular ischemic change.   Electronically signed by: Gilmore Molt MD 09/16/2024 01:09 AM EST RP Workstation: [id number]   CT CHEST ABDOMEN PELVIS W CONTRAST Narrative & Impression EXAM: CT CHEST, ABDOMEN AND PELVIS WITH CONTRAST 09/16/2024 01:35:22 AM   COMPARISON: None available.   CLINICAL HISTORY:  IMPRESSION: 1. Acute displaced comminuted left anterior seventh, eighth, ninth, and tenth rib fractures with possible acute nondisplaced posterior left ninth rib fracture. 2. Severe atherosclerotic plaque of the aorta with aortic valve leaflet and multivessel coronary artery calcifications. 3. Colonic diverticulosis without evidence of diverticulitis. 4. Other, non-acute and/or normal findings as above.   Electronically signed by: Morgane Naveau MD 09/16/2024 02:04 AM EST RP Workstation: [id number]     IV/PRN Meds:  Tylenol  1000mg  PO x1, Ibuprofen  800mg  PO x1, Tylenol  650mg  PO x1, Oxycodone  5mg  PO x1     Assessment and Plan per Sundil, Subrina, MD on 12.06.25 Assessment & Plan: Intracranial bleeding versus cavernous hemangioma Multiple closed rib fracture  Mechanical fall -Present emergency department for evaluation for mechanical fall.  Patient found to have multiple rib fracture and intracranial bleeding versus cavernous hemangioma. -At presentation to ED patient found hypertensive and eventually blood pressure has been improved. -Chest x-ray showed minimally displaced left lateral rib fracture.  CT chest abdomen pelvis showed acute displaced fracture 7-10. EDP discussed case with general surgery recommended pain control. -CT head 7 mm left cerebellar mass versus hemorrhage.  MRI brain showing left  cerebellum and left thalamus with surrounding edema.  These could be due to underling cavernous malformations (favored given characteristic T2 hypointensity) or hypertensive hemorrhages given evidence of other prior microhemorrhages, but given the atypical central enhancement hemorrhagic metastases is not excluded. Recommend a follow up MRI with contrast in 4-6 weeks. -Discussed case with on-call neurosurgery Dr. Meryl reviewed the imaging and stated that patient possibly has intracranial bleeding vs cavernous hemangioma however it is very difficult to differentiate.  Per neurosurgery at this time there is no need for repeat imaging or hypertonic saline, neurosurgery will evaluate patient in the morning. -Regarding intracranial bleeding : continue neurocheck every 4 hours.  Continue maintenance with blood pressure around 120/80.  Avoid antiplatelet and pharmacological DVT prophylaxis. -Regarding rib fracture: Continue pain control with Tylenol , oxycodone , lidocaine  patch and fentanyl  as needed.  Continue pulmonary toiletry and supplemental oxygen.  Continue spirometry. -Continue fall precaution. -Appreciate neurosurgery and trauma surgery consult and recommendation.   Essential hypertension -Continue amlodipine  and losartan    GERD -Continue Protonix    History of vascular dementia History of generalized anxiety disorder -Continue Effexor   Code Status: DNR   Discharge Planning: ongoing   Family Contact:  Spoke with daughter at bedside.    IDT: updated   Goals of Care: DNR  Medication list and Transfer Summary placed under Media Tab.     Should patient need ambulance transfer at discharge- please use [facility]) as they contract this service for our active hospice patients.    Please call with any hospice related questions or concerns.   Nat Babe, BSN, Medical Laboratory Scientific Officer  [facility] Liaison [phone]

## 2024-09-16 NOTE — H&P (Signed)
 History and Physical    Denise Lane FMW:[id number] DOB: 1930-05-11 DOA: 09/15/2024  PCP: [name], NP   Patient coming from: Home   Chief Complaint:  Chief Complaint  Patient presents with   Fall   ED TRIAGE note:  Pt BIB [name] from home for fall and rib pain. Pt lives at home by herself, daughter has ring cameras in the house and saw pt fall at 1430 this afternoon. Pt complaining of L side rib pain, pt is unsure if she lost consciousness and does not remember falling. Pt states she has problems with her knee that causes her to fall. VSS per EMS, [facility].            HPI:  Denise Lane is a 89 y.o. female with medical history significant of vascular dementia, essential hypertension, hyperlipidemia, hemochromatosis, nephrolithiasis, arthritis and GERD presented to emergency department for evaluation for fall. History per patient and EMS patient lives alone however per daughter to the camera all around her house to keep an eye on her.  Patient attempted to stand up attempted to walk without her cane reported fall on the left side hip.  This was witnessed on the camera.  No loss of consciousness.  Since the fall patient has left-sided chest wall pain.  Denies any headache. Patient is DNR/DNI.  ED Course:  At presentation to ED patient was hypertensive and eventually blood pressure has been improved.  Hemodynamically stable.  Imaging, MRI of the brain showing: 1. Findings compatible with acute/recent subcentimeter hemorrhages in the left cerebellum and left thalamus with surrounding edema. These could be due to underling cavernous malformations (favored given characteristic T2 hypointensity) or hypertensive hemorrhages given evidence of other prior microhemorrhages, but given the atypical central enhancement hemorrhagic metastases is not excluded. Recommend a follow up MRI with contrast in 4-6 weeks. 2. Moderate to advanced chronic microvascular ischemic  change.   CT chest abdomen pelvis: 1. Acute displaced comminuted left anterior seventh, eighth, ninth, and tenth rib fractures with possible acute nondisplaced posterior left ninth rib fracture. 2. Severe atherosclerotic plaque of the aorta with aortic valve leaflet and multivessel coronary artery calcifications. 3. Colonic diverticulosis without evidence of diverticulitis. 4. Other, non-acute and/or normal findings as above.  CT head: 1. Acute displaced comminuted left anterior seventh, eighth, ninth, and tenth rib fractures with possible acute nondisplaced posterior left ninth rib fracture. 2. Severe atherosclerotic plaque of the aorta with aortic valve leaflet and multivessel coronary artery calcifications. 3. Colonic diverticulosis without evidence of diverticulitis. 4. Other, non-acute and/or normal findings as above.  Lab work, CBC unremarkable. CMP unremarkable.  Normal mag level.  UA no evidence of UTI.  I have discussed case with on-call neurosurgery Dr. Meryl reviewed the imaging and stated that patient possibly has intracranial bleeding vs cavernous hemangioma however it is very difficult to differentiate.  Per neurosurgery at this time there is no need for repeat imaging or hypertonic saline, neurosurgery will evaluate patient in the morning. ED physician spoke with general surgery Dr. Glennis recommended pain control.  In the ED patient received Tylenol  1000 mg, ibuprofen  100 mg and lidocaine  patch has been applied.  Hospitalist consulted for further evaluation management of intracranial bleeding and multiple rib fractures secondary from fall.   Significant labs in the ED: Lab Orders         CBC with Differential         Comprehensive metabolic panel         Magnesium   Urinalysis, Routine w reflex microscopic -Urine, Clean Catch       Review of Systems:  Review of Systems  Constitutional:  Negative for chills, fever, malaise/fatigue and weight  loss.  Respiratory:  Negative for cough, sputum production and shortness of breath.   Cardiovascular:  Negative for chest pain, palpitations and leg swelling.  Musculoskeletal:  Positive for falls and joint pain. Negative for back pain, myalgias and neck pain.       Left-sided rib cage pain  Neurological:  Negative for dizziness and headaches.  Psychiatric/Behavioral:  The patient is not nervous/anxious and does not have insomnia.     Past Medical History:  Diagnosis Date   Basal cell cancer    face, legs (10/18/2012)   Bruises easily    Chronic lower back pain    Depression    takes Effexor  daily   Diverticulosis of sigmoid colon 11-20-2009   Dry eyes    uses Refresh eye drops daily as needed   Dysphagia    Gastric ulcer    GERD (gastroesophageal reflux disease)    takes Omeprazole daily   H/O hiatal hernia    Hemochromatosis 1987   Hemochromatosis    Hemorrhoids, internal 11-20-09   History of blood transfusion    S/P colonoscopy after polyp removed; got 2 units; esophageal bleed got 1 unit; really messed up hemochromatosis (1/7/20214)   History of bronchitis 1991-1996   chronic; related to winter (10/18/2012)   History of colonic polyps    NOTED 11-20-09 IN COLONOSCOPY REPORT   Hyperlipidemia    no meds required   Hypertension    takes Metoprolol  and Losartan  daily   Joint pain    Joint swelling    Muscle pain    Muscle spasm    takes Robaxin  daily as needed   Occasional tremors    Osteoarthritis    Osteoarthritis of right hip 10/18/2012   Osteoporosis    Overactive bladder    takes Myrbetriq  daily   PONV (postoperative nausea and vomiting)    Rheumatic fever    hx of   Squamous carcinoma    LLE (10/18/2012)   Stenosis of esophagus    Tremor, essential 10-29-09   Urinary urgency    takes Ditropan  daily   Vomiting    occasionally    Past Surgical History:  Procedure Laterality Date   APPENDECTOMY  1952   CATARACT EXTRACTION W/ INTRAOCULAR LENS  IMPLANT,  BILATERAL  2001   both eyes (10/18/2012)   CHOLECYSTECTOMY  1977   COLONOSCOPY     DILATION AND CURETTAGE OF UTERUS  1975   ESOPHAGOGASTRODUODENOSCOPY     LUMBAR DISC SURGERY  1989   SHOULDER ARTHROSCOPY W/ ROTATOR CUFF REPAIR  1998?   left (10/18/2012)   SKIN CANCER EXCISION     multiple (10/18/2012)   TONSILLECTOMY  1953   TOTAL HIP ARTHROPLASTY  10/18/2012   right (10/18/2012)   TOTAL HIP ARTHROPLASTY  10/18/2012   Procedure: TOTAL HIP ARTHROPLASTY;  Surgeon: Fonda SHAUNNA Olmsted, MD;  Location: [facility] OR;  Service: Orthopedics;  Laterality: Right;   TOTAL HIP ARTHROPLASTY Left 07/24/2014   dr olmsted   TOTAL HIP ARTHROPLASTY Left 07/24/2014   Procedure: LEFT TOTAL HIP ARTHROPLASTY;  Surgeon: Fonda SHAUNNA Olmsted, MD;  Location: [facility] OR;  Service: Orthopedics;  Laterality: Left;   TUBAL LIGATION  1975     reports that she has never smoked. She has never used smokeless tobacco. She reports that she does not drink alcohol  and does  not use drugs.  Allergies  Allergen Reactions   Codeine Nausea And Vomiting   Dilaudid [Hydromorphone Hcl] Hives and Other (See Comments)    whelps (10/18/2012)   Macrodantin Hives and Other (See Comments)    drug fever; chills; felt terrible (10/18/2012)   Morphine And Codeine Hives and Nausea And Vomiting   Sulfa Antibiotics Hives, Rash and Other (See Comments)    got real sick (10/18/2012)   Ibuprofen  Other (See Comments)    felt like I have to go to the bathroom constantly    Family History  Problem Relation Age of Onset   Heart disease Mother    Heart disease Father    Hyperlipidemia Daughter    Heart disease Brother    Heart disease Brother    Heart disease Brother    Heart disease Brother    Cancer Sister    Melanoma Sister    Alzheimer's disease Sister    Heart disease Sister    Osteoporosis Sister     Prior to Admission medications   Medication Sig Start Date End Date Taking? Authorizing Provider  acetaminophen  (TYLENOL ) 650 MG CR tablet Take  1,300 mg by mouth 2 (two) times daily.    [provider]  amLODipine  (NORVASC ) 5 MG tablet Take 1 tablet (5 mg total) by mouth daily. Start if Systolic BP above 140 mmHg 04/26/24 04/26/25  Von Bellis, MD  carboxymethylcellulose (REFRESH PLUS) 0.5 % SOLN Place 1 drop into both eyes 3 (three) times daily as needed. Uses more frequently in left eye as needed.    [provider]  cholecalciferol  (VITAMIN D) 1000 UNITS tablet Take 1,000 Units by mouth 2 (two) times daily. 10/17/12   [provider]  cyanocobalamin  1000 MCG tablet Take 1,000 mcg by mouth daily.    [provider]  gabapentin  (NEURONTIN ) 300 MG capsule TAKE 1 CAPSULE EVERY DAY Patient taking differently: Take 300 mg by mouth at bedtime. 03/31/23   Eubanks, Jessica K, NP  HYDROcodone -acetaminophen  (NORCO/VICODIN) 5-325 MG tablet Take 0.5 tablets by mouth every 12 (twelve) hours as needed for moderate pain (pain score 4-6). 01/24/24   [name], NP  Krill Oil 350 MG CAPS Take 1 capsule by mouth daily.    [provider]  losartan  (COZAAR ) 50 MG tablet Take 1 tablet (50 mg total) by mouth at bedtime. Skip the dose if SYSTOLIC BP <130 2/84/74   Von Bellis, MD  Multiple Vitamins-Minerals (PRESERVISION AREDS 2) CAPS Take 1 capsule by mouth in the morning and at bedtime.    [provider]  oxybutynin  (DITROPAN ) 5 MG tablet Take 5 mg by mouth 2 (two) times daily.    [provider]  pantoprazole  (PROTONIX ) 40 MG tablet TAKE 1 TABLET EVERY DAY 07/16/23   Eubanks, Jessica K, NP  venlafaxine  XR (EFFEXOR -XR) 37.5 MG 24 hr capsule TAKE 1 CAPSULE EVERY DAY WITH BREAKFAST 06/15/23   [name], NP     Physical Exam: Vitals:   09/15/24 2300 09/16/24 0111 09/16/24 0618 09/16/24 0631  BP: (!) 145/60   (!) 150/76  Pulse: 69   (!) 55  Resp: 18  15 13   Temp:  98.4 F (36.9 C)    TempSrc:   Oral   SpO2: 98%   98%    Physical Exam Constitutional:      General: She is not  in acute distress.    Appearance: She is not ill-appearing.  HENT:     Mouth/Throat:  Mouth: Mucous membranes are moist.  Eyes:     Pupils: Pupils are equal, round, and reactive to light.  Cardiovascular:     Rate and Rhythm: Normal rate.     Pulses: Normal pulses.     Heart sounds: Normal heart sounds.  Pulmonary:     Breath sounds: Normal breath sounds.  Abdominal:     Palpations: Abdomen is soft.  Musculoskeletal:     Cervical back: Neck supple.     Right lower leg: No edema.     Left lower leg: No edema.  Skin:    Capillary Refill: Capillary refill takes less than 2 seconds.  Neurological:     Mental Status: She is alert.  Psychiatric:        Mood and Affect: Mood normal.      Labs on Admission: I have personally reviewed following labs and imaging studies  CBC: Recent Labs  Lab 09/15/24 2148  WBC 8.8  NEUTROABS 5.8  HGB 11.5*  HCT 35.3*  MCV 103.5*  PLT 243   Basic Metabolic Panel: Recent Labs  Lab 09/15/24 2148  NA 138  K 3.5  CL 98  CO2 30  GLUCOSE 121*  BUN 12  CREATININE 0.64  CALCIUM  9.1  MG 2.0   GFR: CrCl cannot be calculated (Unknown ideal weight.). Liver Function Tests: Recent Labs  Lab 09/15/24 2148  AST 22  ALT 12  ALKPHOS 73  BILITOT 0.5  PROT 5.9*  ALBUMIN  3.2*   No results for input(s): LIPASE, AMYLASE in the last 168 hours. No results for input(s): AMMONIA in the last 168 hours. Coagulation Profile: No results for input(s): INR, PROTIME in the last 168 hours. Cardiac Enzymes: No results for input(s): CKTOTAL, CKMB, CKMBINDEX, TROPONINI, TROPONINIHS in the last 168 hours. BNP (last 3 results) No results for input(s): BNP in the last 8760 hours. HbA1C: No results for input(s): HGBA1C in the last 72 hours. CBG: No results for input(s): GLUCAP in the last 168 hours. Lipid Profile: No results for input(s): CHOL, HDL, LDLCALC, TRIG, CHOLHDL, LDLDIRECT in the last 72 hours. Thyroid   Function Tests: No results for input(s): TSH, T4TOTAL, FREET4, T3FREE, THYROIDAB in the last 72 hours. Anemia Panel: No results for input(s): VITAMINB12, FOLATE, FERRITIN, TIBC, IRON, RETICCTPCT in the last 72 hours. Urine analysis:    Component Value Date/Time   COLORURINE STRAW (A) 09/15/2024 2335   APPEARANCEUR CLEAR 09/15/2024 2335   LABSPEC 1.008 09/15/2024 2335   PHURINE 7.0 09/15/2024 2335   GLUCOSEU NEGATIVE 09/15/2024 2335   HGBUR NEGATIVE 09/15/2024 2335   BILIRUBINUR NEGATIVE 09/15/2024 2335   BILIRUBINUR small 07/24/2019 1253   KETONESUR NEGATIVE 09/15/2024 2335   PROTEINUR NEGATIVE 09/15/2024 2335   UROBILINOGEN negative (A) 07/24/2019 1253   UROBILINOGEN 0.2 04/07/2014 0309   NITRITE NEGATIVE 09/15/2024 2335   LEUKOCYTESUR NEGATIVE 09/15/2024 2335    Radiological Exams on Admission: I have personally reviewed images CT CHEST ABDOMEN PELVIS W CONTRAST Result Date: 09/16/2024 EXAM: CT CHEST, ABDOMEN AND PELVIS WITH CONTRAST 09/16/2024 01:35:22 AM TECHNIQUE: CT of the chest, abdomen and pelvis was performed with the administration of 75 mL of iohexol  (OMNIPAQUE ) 350 MG/ML injection. Multiplanar reformatted images are provided for review. Automated exposure control, iterative reconstruction, and/or weight based adjustment of the mA/kV was utilized to reduce the radiation dose to as low as reasonably achievable. COMPARISON: None available. CLINICAL HISTORY: L rib fx w/ back pain FINDINGS: CHEST: MEDIASTINUM AND LYMPH NODES: Heart: Aortic valve leaflet calcification. 4-vessel coronary artery calcification. Pericardium  is unremarkable. The central airways are clear. No mediastinal, hilar or axillary lymphadenopathy. LUNGS AND PLEURA: Biapical pleural and pulmonary scarring. No focal consolidation or pulmonary edema. No pleural effusion or pneumothorax. VASCULATURE: Severe atherosclerotic plaque of the aorta. BONES AND SOFT TISSUES: Acute displaced comminuted left  anterior seventh, eighth, ninth, and tenth rib fractures. Possible acute nondisplaced posterior left ninth rib fracture. ABDOMEN AND PELVIS: LIVER: Chronic Metallic densities noted along the liver right lobe. GALLBLADDER AND BILE DUCTS: Status post cholecystectomy. No biliary ductal dilatation. SPLEEN: No acute abnormality. PANCREAS: Diffusely atrophic. No focal lesion. Otherwise normal pancreatic contour. No surrounding inflammatory changes. No main pancreatic ductal dilatation. ADRENAL GLANDS: No acute abnormality. KIDNEYS, URETERS AND BLADDER: No stones in the kidneys or ureters. No hydronephrosis. No perinephric or periureteral stranding. Urinary bladder is unremarkable. GI AND BOWEL: Colonic diverticulosis. No small or large bowel thickening or dilatation. Stomach demonstrates no acute abnormality. There is no bowel obstruction. REPRODUCTIVE ORGANS: No acute abnormality. PERITONEUM AND RETROPERITONEUM: No ascites. No free air. VASCULATURE: Aorta is normal in caliber. ABDOMINAL AND PELVIS LYMPH NODES: No lymphadenopathy. REPRODUCTIVE ORGANS: No acute abnormality. BONES AND SOFT TISSUES: Bilateral total hip arthroplasties partially visualized. Diffusely decreased bone density. Dextroscoliosis of the thoracolumbar spine centered at the L2-L3 level with associated severe degenerative changes. No focal soft tissue abnormality. IMPRESSION: 1. Acute displaced comminuted left anterior seventh, eighth, ninth, and tenth rib fractures with possible acute nondisplaced posterior left ninth rib fracture. 2. Severe atherosclerotic plaque of the aorta with aortic valve leaflet and multivessel coronary artery calcifications. 3. Colonic diverticulosis without evidence of diverticulitis. 4. Other, non-acute and/or normal findings as above. Electronically signed by: Morgane Naveau MD 09/16/2024 02:04 AM EST RP Workstation: [id number]   MR Brain W and Wo Contrast Result Date: 09/16/2024 EXAM: MRI BRAIN WITH AND WITHOUT CONTRAST  09/16/2024 12:13:38 AM TECHNIQUE: Multiplanar multisequence MRI of the head/brain was performed with and without the administration of intravenous contrast. COMPARISON: CT Head earlier today CLINICAL HISTORY: Neuro deficit, acute, stroke suspected FINDINGS: BRAIN AND VENTRICLES: Approximately 5 mm focus of T2 hypointensity and subtle artifact in the left cerebellum with surrounding edema, compatible with acute or recent hemorrhage. This area demonstrates a central focus of enhancement (series 16, image 19). Additional focus of more subtle 3-4 mm T2 hypointensity in the left thalamus with surrounding edema (for example, see series 10, image 14 and series 11, image 28) which also demonstrates a punctate focus of enhancement centrally. Additional punctate foci of susceptibility artifact in the right thalamus and left basal ganglia are compatible with chronic microhemorrhages (likely hypertensive in etiology given distribution). Moderate to advanced scattered T2 hyperintensities in the white matter, compatible with chronic microvascular ischemic change. No mass effect or midline shift. No hydrocephalus. Normal flow voids. ORBITS: No acute abnormality. SINUSES: No acute abnormality. BONES AND SOFT TISSUES: Normal bone marrow signal and enhancement. No acute soft tissue abnormality. IMPRESSION: 1. Findings compatible with acute/recent subcentimeter hemorrhages in the left cerebellum and left thalamus with surrounding edema. These could be due to underling cavernous malformations (favored given characteristic T2 hypointensity) or hypertensive hemorrhages given evidence of other prior microhemorrhages, but given the atypical central enhancement hemorrhagic metastases is not excluded. Recommend a follow up MRI with contrast in 4-6 weeks. 2. Moderate to advanced chronic microvascular ischemic change. Electronically signed by: Gilmore Molt MD 09/16/2024 01:09 AM EST RP Workstation: [id number]   CT Head Wo  Contrast Addendum Date: 09/15/2024 ADDENDUM #1 ADDENDUM: These findings were discussed with doctor Belfi by Dr. Margarite  over the phone on 09/15/2024 at 08:30 pm ---------------------------------------------------- Electronically signed by: Morgane Naveau MD 09/15/2024 08:30 PM EST RP Workstation: [id number]   Result Date: 09/15/2024  ORIGINAL REPORT  EXAM: CT HEAD WITHOUT CONTRAST 09/15/2024 08:11:00 PM TECHNIQUE: CT of the head was performed without the administration of intravenous contrast. Automated exposure control, iterative reconstruction, and/or weight based adjustment of the mA/kV was utilized to reduce the radiation dose to as low as reasonably achievable. COMPARISON: CT head 06:27:15 CLINICAL HISTORY: fall FINDINGS: BRAIN AND VENTRICLES: 7 mm hyperdensity of the left cerebellar hemisphere (3:8). May represent a mass versus hemorrhage. Cerebral ventricle sizes are concordant with the degree of cerebral volume loss. Patchy and confluent areas of decreased attenuation are noted throughout the deep and periventricular white matter of the cerebral hemispheres bilaterally suggestive of chronic microvascular ischemic changes. Question bilateral basal ganglia loss of gray-white matter differentiation. Atherosclerotic calcifications are present within the cavernous internal carotid and vertebral arteries. No extra-axial collection. No mass effect or midline shift. ORBITS: No acute abnormality. SINUSES: No acute abnormality. SOFT TISSUES AND SKULL: No acute soft tissue abnormality. No skull fracture. IMPRESSION: 1. A 7 mm hyperdensity in the left cerebellar hemisphere, possibly representing a mass versus hemorrhage. 2. Question bilateral basal ganglia loss of gray-white matter differentiation. Findings may represent chronic severe microvascular ischemic changes but cannot exclude other etiologies such as acute infarction or other causes of edema. 3. Recommend MRI head with and without contrast for further  evaluation. Electronically signed by: Morgane Naveau MD 09/15/2024 08:25 PM EST RP Workstation: [id number]   CT Cervical Spine Wo Contrast Result Date: 09/15/2024 EXAM: CT CERVICAL SPINE WITHOUT CONTRAST 09/15/2024 08:11:00 PM TECHNIQUE: CT of the cervical spine was performed without the administration of intravenous contrast. Multiplanar reformatted images are provided for review. Automated exposure control, iterative reconstruction, and/or weight based adjustment of the mA/kV was utilized to reduce the radiation dose to as low as reasonably achievable. COMPARISON: None available. CLINICAL HISTORY: fall fall FINDINGS: CERVICAL SPINE: BONES AND ALIGNMENT: Diffusely decreased bone density. Grade 1 anterolisthesis of C4 on C5, C5 on C6, C6 on C7, C7 on T1, T1 on T2. No acute fracture or traumatic malalignment. DEGENERATIVE CHANGES: Multilevel moderate-to-severe degenerative changes of the spine. Question associated severe osseous neural foraminal stenosis of the right C5-C6 level. No severe osseous central canal stenosis. SOFT TISSUES: No prevertebral soft tissue swelling. Atherosclerotic plaque of the carotid arteries within the neck. LUNGS: Biapical marked pleural and pulmonary scarring. Likely mild centrilobular emphysematous changes. HEPATOBILIARY: Trace hepatic bile duct wall thickening. IMPRESSION: 1. No acute abnormality of the cervical spine related to the fall. Electronically signed by: Morgane Naveau MD 09/15/2024 08:28 PM EST RP Workstation: [id number]   DG Chest Portable 1 View Result Date: 09/15/2024 CLINICAL DATA:  Clemens, rib pain EXAM: PORTABLE CHEST 1 VIEW COMPARISON:  07/18/2014 FINDINGS: Single frontal view of the chest demonstrates an unremarkable cardiac silhouette. No acute airspace disease, effusion, or pneumothorax. There is a minimally displaced left lateral seventh rib fracture, partially obscured by an overlying cardiac lead. No other acute bony abnormalities. IMPRESSION: 1. Minimally  displaced left lateral seventh rib fracture. 2. Otherwise no acute intrathoracic process. Electronically Signed   By: Ozell Daring M.D.   On: 09/15/2024 19:47       Assessment/Plan: Principal Problem:   Intracranial bleeding (HCC) Active Problems:   Multiple rib fractures   Essential hypertension   GERD (gastroesophageal reflux disease)   Hyperlipidemia   Vascular dementia (HCC)   Generalized anxiety disorder  Assessment and Plan: Intracranial bleeding versus cavernous hemangioma Multiple closed rib fracture  Mechanical fall -Present emergency department for evaluation for mechanical fall.  Patient found to have multiple rib fracture and intracranial bleeding versus cavernous hemangioma. -At presentation to ED patient found hypertensive and eventually blood pressure has been improved. -Chest x-ray showed minimally displaced left lateral rib fracture.  CT chest abdomen pelvis showed acute displaced fracture 7-10. EDP discussed case with general surgery recommended pain control. -CT head 7 mm left cerebellar mass versus hemorrhage.  MRI brain showing left  cerebellum and left thalamus with surrounding edema. These could be due to underling cavernous malformations (favored given characteristic T2 hypointensity) or hypertensive hemorrhages given evidence of other prior microhemorrhages, but given the atypical central enhancement hemorrhagic metastases is not excluded. Recommend a follow up MRI with contrast in 4-6 weeks. -Discussed case with on-call neurosurgery Dr. Meryl reviewed the imaging and stated that patient possibly has intracranial bleeding vs cavernous hemangioma however it is very difficult to differentiate.  Per neurosurgery at this time there is no need for repeat imaging or hypertonic saline, neurosurgery will evaluate patient in the morning. -Regarding intracranial bleeding : continue neurocheck every 4 hours.  Continue maintenance with blood pressure around 120/80.  Avoid  antiplatelet and pharmacological DVT prophylaxis. -Regarding rib fracture: Continue pain control with Tylenol , oxycodone , lidocaine  patch and fentanyl  as needed.  Continue pulmonary toiletry and supplemental oxygen.  Continue spirometry. -Continue fall precaution. -Appreciate neurosurgery and trauma surgery consult and recommendation.  Essential hypertension -Continue amlodipine  and losartan   GERD -Continue Protonix   History of vascular dementia History of generalized anxiety disorder -Continue Effexor    DVT prophylaxis:  SCDs and TED hose Code Status:  DNR/DNI(Do NOT Intubate) Diet: Heart healthy diet Family Communication:   Family was present at bedside, at the time of interview. Opportunity was given to ask question and all questions were answered satisfactorily.  Disposition Plan: Continue neurocheck every 4 hours. Consults: Neurosurgery and trauma surgeon Admission status:   Inpatient, Step Down Unit  Severity of Illness: The appropriate patient status for this patient is INPATIENT. Inpatient status is judged to be reasonable and necessary in order to provide the required intensity of service to ensure the patient's safety. The patient's presenting symptoms, physical exam findings, and initial radiographic and laboratory data in the context of their chronic comorbidities is felt to place them at high risk for further clinical deterioration. Furthermore, it is not anticipated that the patient will be medically stable for discharge from the hospital within 2 midnights of admission.   * I certify that at the point of admission it is my clinical judgment that the patient will require inpatient hospital care spanning beyond 2 midnights from the point of admission due to high intensity of service, high risk for further deterioration and high frequency of surveillance required.DEWAINE    Alexandr Yaworski, MD [facility]  How to contact the [facility] Attending or Consulting provider 7A - 7P  or covering provider during after hours 7P -7A, for this patient.  Check the care team in [facility] and look for a) attending/consulting [facility] provider listed and b) the [facility] team listed Log into [email or web address] and use 's universal password to access. If you do not have the password, please contact the hospital operator. Locate the [facility] provider you are looking for under [facility] and page to a number that you can be directly reached. If you still have difficulty reaching the provider, please page the [facility] (Director on Call) for  the Hospitalists listed on amion for assistance.  09/16/2024, 6:37 AM

## 2024-09-16 NOTE — Consult Note (Signed)
 Neurosurgery Consult Note  Assessment:  89 year old female who presents after a fall.  Imaging shows scattered hemorrhagic foci throughout the brain most notably the left cerebellum and left thalamus.  Differential includes multiple cavernoma versus amyloid angiopathy.  Less likely metastatic disease.  Not a candidate for any form of surgical intervention.  Family not interested in repeat imaging and working toward hospice  Plan:  Nothing to do from neurosurgical standpoint Ok for DVT ppx on 12/9 Hold all other anticoagulation/antiplatelets     CC: rib pain  HPI:     Patient is a 89 y.o. female w/ hx HTN, HLD who presents after a fall.  Brain imaging shows scattered foci of SWI signal throughout the brain most notably in the left cerebellum and left thalamus.  The patient currently has no headache.  I had a detailed discussion with the daughter regarding the patient's goals of care.  They are currently in the process of working with home hospice versus admission to hospice.  They are not interested in any form of brain surgery or repeat imaging   Patient Active Problem List   Diagnosis Date Noted   Intracranial bleeding (HCC) 09/16/2024   Multiple rib fractures 09/16/2024   Vascular dementia (HCC) 09/16/2024   Generalized anxiety disorder 09/16/2024   Complicated UTI (urinary tract infection) 04/24/2024   Nephrolithiasis 04/24/2024   Hypertensive heart disease with heart failure (HCC) 01/05/2020   UTI (urinary tract infection) 07/20/2019   Age-related osteoporosis without current pathological fracture 06/20/2018   Overactive bladder 06/20/2018   GERD (gastroesophageal reflux disease) 06/20/2018   Leg cramps 07/16/2015   Morton's neuroma of left foot 07/16/2015   Right thigh pain 04/07/2014   Periprosthetic hip fracture 04/07/2014   Osteoarthritis of left hip 03/29/2014   Osteoarthritis 02/08/2014   Neuropathy 02/08/2014   Anxiety and depression 02/08/2014   Urinary  incontinence 01/12/2013   Back pain 01/12/2013   Muscle pain    Hyperlipidemia    Essential hypertension    Osteoarthritis of right hip 10/18/2012   Hereditary hemochromatosis 11/29/2011   Past Medical History:  Diagnosis Date   Basal cell cancer    face, legs (10/18/2012)   Bruises easily    Chronic lower back pain    Depression    takes Effexor  daily   Diverticulosis of sigmoid colon 11-20-2009   Dry eyes    uses Refresh eye drops daily as needed   Dysphagia    Gastric ulcer    GERD (gastroesophageal reflux disease)    takes Omeprazole daily   H/O hiatal hernia    Hemochromatosis 1987   Hemochromatosis    Hemorrhoids, internal 11-20-09   History of blood transfusion    S/P colonoscopy after polyp removed; got 2 units; esophageal bleed got 1 unit; really messed up hemochromatosis (1/7/20214)   History of bronchitis 1991-1996   chronic; related to winter (10/18/2012)   History of colonic polyps    NOTED 11-20-09 IN COLONOSCOPY REPORT   Hyperlipidemia    no meds required   Hypertension    takes Metoprolol  and Losartan  daily   Joint pain    Joint swelling    Muscle pain    Muscle spasm    takes Robaxin  daily as needed   Occasional tremors    Osteoarthritis    Osteoarthritis of right hip 10/18/2012   Osteoporosis    Overactive bladder    takes Myrbetriq  daily   PONV (postoperative nausea and vomiting)    Rheumatic fever    hx  of   Squamous carcinoma    LLE (10/18/2012)   Stenosis of esophagus    Tremor, essential 10-29-09   Urinary urgency    takes Ditropan  daily   Vomiting    occasionally    Past Surgical History:  Procedure Laterality Date   APPENDECTOMY  1952   CATARACT EXTRACTION W/ INTRAOCULAR LENS  IMPLANT, BILATERAL  2001   both eyes (10/18/2012)   CHOLECYSTECTOMY  1977   COLONOSCOPY     DILATION AND CURETTAGE OF UTERUS  1975   ESOPHAGOGASTRODUODENOSCOPY     LUMBAR DISC SURGERY  1989   SHOULDER ARTHROSCOPY W/ ROTATOR CUFF REPAIR  1998?   left  (10/18/2012)   SKIN CANCER EXCISION     multiple (10/18/2012)   TONSILLECTOMY  1953   TOTAL HIP ARTHROPLASTY  10/18/2012   right (10/18/2012)   TOTAL HIP ARTHROPLASTY  10/18/2012   Procedure: TOTAL HIP ARTHROPLASTY;  Surgeon: Fonda SHAUNNA Olmsted, MD;  Location: [facility] OR;  Service: Orthopedics;  Laterality: Right;   TOTAL HIP ARTHROPLASTY Left 07/24/2014   dr olmsted   TOTAL HIP ARTHROPLASTY Left 07/24/2014   Procedure: LEFT TOTAL HIP ARTHROPLASTY;  Surgeon: Fonda SHAUNNA Olmsted, MD;  Location: [facility] OR;  Service: Orthopedics;  Laterality: Left;   TUBAL LIGATION  1975    Medications Prior to Admission  Medication Sig Dispense Refill Last Dose/Taking   acetaminophen  (TYLENOL ) 500 MG tablet Take 1,000 mg by mouth in the morning and at bedtime.   09/15/2024 Morning   carboxymethylcellulose (REFRESH PLUS) 0.5 % SOLN Place 1 drop into both eyes 3 (three) times daily as needed. Uses more frequently in left eye as needed.   Past Week   cholecalciferol  (VITAMIN D) 1000 UNITS tablet Take 1,000 Units by mouth 2 (two) times daily.   09/15/2024 Morning   [facility] LIDOCAINE  PAIN-RELIEVING 4 % Place 2 patches onto the skin daily.   09/15/2024 Morning   cyanocobalamin  1000 MCG tablet Take 1,000 mcg by mouth daily.   09/15/2024 Morning   gabapentin  (NEURONTIN ) 300 MG capsule TAKE 1 CAPSULE EVERY DAY (Patient taking differently: Take 300 mg by mouth at bedtime.) 90 capsule 3 09/14/2024   Krill Oil 350 MG CAPS Take 1 capsule by mouth daily.   09/15/2024 Morning   losartan  (COZAAR ) 50 MG tablet Take 1 tablet (50 mg total) by mouth at bedtime. Skip the dose if SYSTOLIC BP <130   09/14/2024   Multiple Vitamins-Minerals (PRESERVISION AREDS 2) CAPS Take 1 capsule by mouth in the morning and at bedtime.   09/15/2024 Morning   oxybutynin  (DITROPAN ) 5 MG tablet Take 5 mg by mouth 2 (two) times daily.   09/15/2024 Morning   pantoprazole  (PROTONIX ) 40 MG tablet TAKE 1 TABLET EVERY DAY 90 tablet 3 09/15/2024 Morning   venlafaxine  XR (EFFEXOR -XR) 37.5 MG  24 hr capsule TAKE 1 CAPSULE EVERY DAY WITH BREAKFAST 90 capsule 3 09/15/2024 Morning   amLODipine  (NORVASC ) 5 MG tablet Take 1 tablet (5 mg total) by mouth daily. Start if Systolic BP above 140 mmHg (Patient not taking: Reported on 09/16/2024) 30 tablet 11 Not Taking   Allergies  Allergen Reactions   Codeine Nausea And Vomiting   Dilaudid [Hydromorphone Hcl] Hives and Other (See Comments)    whelps (10/18/2012)   Macrodantin Hives and Other (See Comments)    drug fever; chills; felt terrible (10/18/2012)   Morphine And Codeine Hives and Nausea And Vomiting   Sulfa Antibiotics Hives, Rash and Other (See Comments)    got real sick (10/18/2012)  Ibuprofen  Other (See Comments)    felt like I have to go to the bathroom constantly Cystitis - NO NSAIDS    Social History   Tobacco Use   Smoking status: Never   Smokeless tobacco: Never  Substance Use Topics   Alcohol  use: No    Family History  Problem Relation Age of Onset   Heart disease Mother    Heart disease Father    Hyperlipidemia Daughter    Heart disease Brother    Heart disease Brother    Heart disease Brother    Heart disease Brother    Cancer Sister    Melanoma Sister    Alzheimer's disease Sister    Heart disease Sister    Osteoporosis Sister      Review of Systems Pertinent items are noted in HPI.  Objective:   Patient Vitals for the past 8 hrs:  BP Temp Temp src Pulse Resp SpO2  09/16/24 0734 (!) 162/64 98 F (36.7 C) Oral 63 12 97 %  09/16/24 0631 (!) 150/76 -- -- (!) 55 13 98 %  09/16/24 0618 -- -- Oral -- 15 --  09/16/24 0111 -- 98.4 F (36.9 C) -- -- -- --   No intake/output data recorded. Total I/O In: 10 [I.V.:10] Out: -     Exam: GCS 4E 5V 69M  AOx3 No aphasia or dysarthria PERRL EOMI, conjugate gaze FS TM Full strength BUE and BLE  Full sensation    Data ReviewCBC:  Lab Results  Component Value Date   WBC 6.0 09/16/2024   RBC 3.45 (L) 09/16/2024   BMP:  Lab Results   Component Value Date   GLUCOSE 111 (H) 09/16/2024   GLUCOSE 109 02/12/2014   GLUCOSE 136 (H) 06/16/2012   CHLORIDE 103 02/12/2014   CO2 31 09/16/2024   CO2 31 (H) 02/12/2014   BUN 11 09/16/2024   BUN 20 01/27/2019   BUN 18.6 02/12/2014   CREATININE 0.70 09/16/2024   CREATININE 0.65 05/02/2024   CREATININE 0.7 02/12/2014   CALCIUM  8.6 (L) 09/16/2024   CALCIUM  10.3 02/12/2014

## 2024-09-17 DIAGNOSIS — I629 Nontraumatic intracranial hemorrhage, unspecified: Secondary | ICD-10-CM | POA: Diagnosis not present

## 2024-09-17 MED ORDER — ENSURE PLUS HIGH PROTEIN PO LIQD
237.0000 mL | Freq: Two times a day (BID) | ORAL | Status: DC
  Administered 2024-09-17 – 2024-09-18 (×2): 237 mL via ORAL

## 2024-09-17 NOTE — Progress Notes (Signed)
 PROGRESS NOTE    Denise Lane  FMW:[id number] DOB: August 15, 1930 DOA: 09/15/2024 PCP: [name], NP  Outpatient Specialists:     Brief Narrative:  Patient is a 89 year old female with history of vascular dementia, hypertension, hyperlipidemia, hemochromatosis, nephrolithiasis, arthritis and GERD.  She was admitted to the hospital following a fall.  MRI of the brain revealed acute/recent subcentimeter hemorrhages in the left cerebellum and left thalamus with surrounding edema.  CT chest, abdomen and pelvis revealed acute displaced comminuted left anterior 7th, 8th, 9th and 10th rib fractures with possible acute nondisplaced posterior left ninth rib fracture.  Patient was under the hospice team prior to presentation.  Neurosurgery does not plan any surgical intervention.  We consulted TOC to assist with discharge plans and needs.  09/17/2024: Patient seen alongside patient's nurses.  Also discussed with patient's daughter extensively.  Apparently, patient developed nausea and vomiting secondary to fentanyl  administration, with associated vasovagal episode.  Patient's heart rate dropped to the 20s to 30s, but resolved almost immediately.  Patient is DNR.  Patient's daughter does not want cardiac monitoring on.  No significant history from patient..     Assessment & Plan:   Principal Problem:   Intracranial bleeding (HCC) Active Problems:   Hyperlipidemia   Essential hypertension   GERD (gastroesophageal reflux disease)   Multiple rib fractures   Vascular dementia (HCC)   Generalized anxiety disorder   Intracranial bleeding versus cavernous hemangioma Multiple closed rib fracture  Mechanical fall -Present emergency department for evaluation for mechanical fall.  Patient found to have multiple rib fracture and intracranial bleeding versus cavernous hemangioma. -Chest x-ray showed minimally displaced left lateral rib fracture.  CT chest abdomen pelvis showed acute displaced  fracture 7-10. EDP discussed case with general surgery recommended pain control. -CT head 7 mm left cerebellar mass versus hemorrhage.  MRI brain showing left  cerebellum and left thalamus with surrounding edema. These could be due to underling cavernous malformations (favored given characteristic T2 hypointensity) or hypertensive hemorrhages given evidence of other prior microhemorrhages, but given the atypical central enhancement hemorrhagic metastases is not excluded. Recommend a follow up MRI with contrast in 4-6 weeks. - As per collateral information: Case was discussed with on-call neurosurgery, Dr. Meryl.  Neurosurgery reviewed the imaging and stated that patient possibly has intracranial bleeding vs cavernous hemangioma however it is very difficult to differentiate.  Per neurosurgery at this time there is no need for repeat imaging or hypertonic saline, neurosurgery will evaluate patient in the morning. - Pursue disposition. - Adequate pain control. -PT OT consult. -Appreciate neurosurgery and trauma surgery consult and recommendation.   Essential hypertension - Controlled.   GERD -Continue Protonix    History of vascular dementia History of generalized anxiety disorder -Continue Effexor    DVT prophylaxis:  Code Status: DO NOT RESUSCITATE.  Patient is under hospice Family Communication: Patient's daughter Disposition Plan:    Consultants:  None  Procedures:  None  Antimicrobials:  None   Subjective: -See above documentation. - Episode of nausea and vomiting following fentanyl  administration, with vasovagal episode.    Objective: Vitals:   09/17/24 0300 09/17/24 0746 09/17/24 1113 09/17/24 1451  BP: 129/60 (!) 153/59 138/63   Pulse: 61 (!) 53 67 (!) 44  Resp: 20 (!) 21 14 (!) 23  Temp: 98.6 F (37 C) 98.4 F (36.9 C) 98.2 F (36.8 C)   TempSrc: Oral Oral Oral   SpO2: 96% 96% 96% 95%  Weight:      Height:  Intake/Output Summary (Last 24 hours) at  09/17/2024 1656 Last data filed at 09/16/2024 2307 Gross per 24 hour  Intake --  Output 400 ml  Net -400 ml   Filed Weights   09/16/24 1350  Weight: 49.4 kg    Examination:  General exam: Appears calm and comfortable  Respiratory system: Clear to auscultation.  Cardiovascular system: S1 & S2 heard Gastrointestinal system: Abdomen is soft and nontender.  Central nervous system: Awake and alert. Extremities: No leg edema  Data Reviewed: I have personally reviewed following labs and imaging studies  CBC: Recent Labs  Lab 09/15/24 2148 09/16/24 0717  WBC 8.8 6.0  NEUTROABS 5.8  --   HGB 11.5* 11.7*  HCT 35.3* 35.0*  MCV 103.5* 101.4*  PLT 243 231   Basic Metabolic Panel: Recent Labs  Lab 09/15/24 2148 09/16/24 0717  NA 138 139  K 3.5 3.6  CL 98 97*  CO2 30 31  GLUCOSE 121* 111*  BUN 12 11  CREATININE 0.64 0.70  CALCIUM  9.1 8.6*  MG 2.0  --    GFR: Estimated Creatinine Clearance: 35 mL/min (by C-G formula based on SCr of 0.7 mg/dL). Liver Function Tests: Recent Labs  Lab 09/15/24 2148  AST 22  ALT 12  ALKPHOS 73  BILITOT 0.5  PROT 5.9*  ALBUMIN  3.2*   No results for input(s): LIPASE, AMYLASE in the last 168 hours. No results for input(s): AMMONIA in the last 168 hours. Coagulation Profile: No results for input(s): INR, PROTIME in the last 168 hours. Cardiac Enzymes: No results for input(s): CKTOTAL, CKMB, CKMBINDEX, TROPONINI in the last 168 hours. BNP (last 3 results) No results for input(s): PROBNP in the last 8760 hours. HbA1C: No results for input(s): HGBA1C in the last 72 hours. CBG: No results for input(s): GLUCAP in the last 168 hours. Lipid Profile: No results for input(s): CHOL, HDL, LDLCALC, TRIG, CHOLHDL, LDLDIRECT in the last 72 hours. Thyroid  Function Tests: No results for input(s): TSH, T4TOTAL, FREET4, T3FREE, THYROIDAB in the last 72 hours. Anemia Panel: No results for input(s):  VITAMINB12, FOLATE, FERRITIN, TIBC, IRON, RETICCTPCT in the last 72 hours. Urine analysis:    Component Value Date/Time   COLORURINE STRAW (A) 09/15/2024 2335   APPEARANCEUR CLEAR 09/15/2024 2335   LABSPEC 1.008 09/15/2024 2335   PHURINE 7.0 09/15/2024 2335   GLUCOSEU NEGATIVE 09/15/2024 2335   HGBUR NEGATIVE 09/15/2024 2335   BILIRUBINUR NEGATIVE 09/15/2024 2335   BILIRUBINUR small 07/24/2019 1253   KETONESUR NEGATIVE 09/15/2024 2335   PROTEINUR NEGATIVE 09/15/2024 2335   UROBILINOGEN negative (A) 07/24/2019 1253   UROBILINOGEN 0.2 04/07/2014 0309   NITRITE NEGATIVE 09/15/2024 2335   LEUKOCYTESUR NEGATIVE 09/15/2024 2335   Sepsis Labs: @LABRCNTIP (procalcitonin:4,lacticidven:4)  )No results found for this or any previous visit (from the past 240 hours).       Radiology Studies: CT CHEST ABDOMEN PELVIS W CONTRAST Result Date: 09/16/2024 EXAM: CT CHEST, ABDOMEN AND PELVIS WITH CONTRAST 09/16/2024 01:35:22 AM TECHNIQUE: CT of the chest, abdomen and pelvis was performed with the administration of 75 mL of iohexol  (OMNIPAQUE ) 350 MG/ML injection. Multiplanar reformatted images are provided for review. Automated exposure control, iterative reconstruction, and/or weight based adjustment of the mA/kV was utilized to reduce the radiation dose to as low as reasonably achievable. COMPARISON: None available. CLINICAL HISTORY: L rib fx w/ back pain FINDINGS: CHEST: MEDIASTINUM AND LYMPH NODES: Heart: Aortic valve leaflet calcification. 4-vessel coronary artery calcification. Pericardium is unremarkable. The central airways are clear. No mediastinal, hilar or  axillary lymphadenopathy. LUNGS AND PLEURA: Biapical pleural and pulmonary scarring. No focal consolidation or pulmonary edema. No pleural effusion or pneumothorax. VASCULATURE: Severe atherosclerotic plaque of the aorta. BONES AND SOFT TISSUES: Acute displaced comminuted left anterior seventh, eighth, ninth, and tenth rib fractures.  Possible acute nondisplaced posterior left ninth rib fracture. ABDOMEN AND PELVIS: LIVER: Chronic Metallic densities noted along the liver right lobe. GALLBLADDER AND BILE DUCTS: Status post cholecystectomy. No biliary ductal dilatation. SPLEEN: No acute abnormality. PANCREAS: Diffusely atrophic. No focal lesion. Otherwise normal pancreatic contour. No surrounding inflammatory changes. No main pancreatic ductal dilatation. ADRENAL GLANDS: No acute abnormality. KIDNEYS, URETERS AND BLADDER: No stones in the kidneys or ureters. No hydronephrosis. No perinephric or periureteral stranding. Urinary bladder is unremarkable. GI AND BOWEL: Colonic diverticulosis. No small or large bowel thickening or dilatation. Stomach demonstrates no acute abnormality. There is no bowel obstruction. REPRODUCTIVE ORGANS: No acute abnormality. PERITONEUM AND RETROPERITONEUM: No ascites. No free air. VASCULATURE: Aorta is normal in caliber. ABDOMINAL AND PELVIS LYMPH NODES: No lymphadenopathy. REPRODUCTIVE ORGANS: No acute abnormality. BONES AND SOFT TISSUES: Bilateral total hip arthroplasties partially visualized. Diffusely decreased bone density. Dextroscoliosis of the thoracolumbar spine centered at the L2-L3 level with associated severe degenerative changes. No focal soft tissue abnormality. IMPRESSION: 1. Acute displaced comminuted left anterior seventh, eighth, ninth, and tenth rib fractures with possible acute nondisplaced posterior left ninth rib fracture. 2. Severe atherosclerotic plaque of the aorta with aortic valve leaflet and multivessel coronary artery calcifications. 3. Colonic diverticulosis without evidence of diverticulitis. 4. Other, non-acute and/or normal findings as above. Electronically signed by: Morgane Naveau MD 09/16/2024 02:04 AM EST RP Workstation: [id number]   MR Brain W and Wo Contrast Result Date: 09/16/2024 EXAM: MRI BRAIN WITH AND WITHOUT CONTRAST 09/16/2024 12:13:38 AM TECHNIQUE: Multiplanar  multisequence MRI of the head/brain was performed with and without the administration of intravenous contrast. COMPARISON: CT Head earlier today CLINICAL HISTORY: Neuro deficit, acute, stroke suspected FINDINGS: BRAIN AND VENTRICLES: Approximately 5 mm focus of T2 hypointensity and subtle artifact in the left cerebellum with surrounding edema, compatible with acute or recent hemorrhage. This area demonstrates a central focus of enhancement (series 16, image 19). Additional focus of more subtle 3-4 mm T2 hypointensity in the left thalamus with surrounding edema (for example, see series 10, image 14 and series 11, image 28) which also demonstrates a punctate focus of enhancement centrally. Additional punctate foci of susceptibility artifact in the right thalamus and left basal ganglia are compatible with chronic microhemorrhages (likely hypertensive in etiology given distribution). Moderate to advanced scattered T2 hyperintensities in the white matter, compatible with chronic microvascular ischemic change. No mass effect or midline shift. No hydrocephalus. Normal flow voids. ORBITS: No acute abnormality. SINUSES: No acute abnormality. BONES AND SOFT TISSUES: Normal bone marrow signal and enhancement. No acute soft tissue abnormality. IMPRESSION: 1. Findings compatible with acute/recent subcentimeter hemorrhages in the left cerebellum and left thalamus with surrounding edema. These could be due to underling cavernous malformations (favored given characteristic T2 hypointensity) or hypertensive hemorrhages given evidence of other prior microhemorrhages, but given the atypical central enhancement hemorrhagic metastases is not excluded. Recommend a follow up MRI with contrast in 4-6 weeks. 2. Moderate to advanced chronic microvascular ischemic change. Electronically signed by: Gilmore Molt MD 09/16/2024 01:09 AM EST RP Workstation: [id number]   CT Head Wo Contrast Addendum Date: 09/15/2024 ADDENDUM #1  ADDENDUM:  These findings were discussed with doctor Belfi by Dr. Margarite over the phone on 09/15/2024 at 08:30 pm ---------------------------------------------------- Electronically  signed by: Morgane Naveau MD 09/15/2024 08:30 PM EST RP Workstation: [id number]   Result Date: 09/15/2024  ORIGINAL REPORT EXAM: CT HEAD WITHOUT CONTRAST 09/15/2024 08:11:00 PM TECHNIQUE: CT of the head was performed without the administration of intravenous contrast. Automated exposure control, iterative reconstruction, and/or weight based adjustment of the mA/kV was utilized to reduce the radiation dose to as low as reasonably achievable. COMPARISON: CT head 06:27:15 CLINICAL HISTORY: fall FINDINGS: BRAIN AND VENTRICLES: 7 mm hyperdensity of the left cerebellar hemisphere (3:8). May represent a mass versus hemorrhage. Cerebral ventricle sizes are concordant with the degree of cerebral volume loss. Patchy and confluent areas of decreased attenuation are noted throughout the deep and periventricular white matter of the cerebral hemispheres bilaterally suggestive of chronic microvascular ischemic changes. Question bilateral basal ganglia loss of gray-white matter differentiation. Atherosclerotic calcifications are present within the cavernous internal carotid and vertebral arteries. No extra-axial collection. No mass effect or midline shift. ORBITS: No acute abnormality. SINUSES: No acute abnormality. SOFT TISSUES AND SKULL: No acute soft tissue abnormality. No skull fracture. IMPRESSION: 1. A 7 mm hyperdensity in the left cerebellar hemisphere, possibly representing a mass versus hemorrhage. 2. Question bilateral basal ganglia loss of gray-white matter differentiation. Findings may represent chronic severe microvascular ischemic changes but cannot exclude other etiologies such as acute infarction or other causes of edema. 3. Recommend MRI head with and without contrast for further evaluation. Electronically signed by: Morgane Naveau MD 09/15/2024  08:25 PM EST RP Workstation: [id number]   CT Cervical Spine Wo Contrast Result Date: 09/15/2024 EXAM: CT CERVICAL SPINE WITHOUT CONTRAST 09/15/2024 08:11:00 PM TECHNIQUE: CT of the cervical spine was performed without the administration of intravenous contrast. Multiplanar reformatted images are provided for review. Automated exposure control, iterative reconstruction, and/or weight based adjustment of the mA/kV was utilized to reduce the radiation dose to as low as reasonably achievable. COMPARISON: None available. CLINICAL HISTORY: fall fall FINDINGS: CERVICAL SPINE: BONES AND ALIGNMENT: Diffusely decreased bone density. Grade 1 anterolisthesis of C4 on C5, C5 on C6, C6 on C7, C7 on T1, T1 on T2. No acute fracture or traumatic malalignment. DEGENERATIVE CHANGES: Multilevel moderate-to-severe degenerative changes of the spine. Question associated severe osseous neural foraminal stenosis of the right C5-C6 level. No severe osseous central canal stenosis. SOFT TISSUES: No prevertebral soft tissue swelling. Atherosclerotic plaque of the carotid arteries within the neck. LUNGS: Biapical marked pleural and pulmonary scarring. Likely mild centrilobular emphysematous changes. HEPATOBILIARY: Trace hepatic bile duct wall thickening. IMPRESSION: 1. No acute abnormality of the cervical spine related to the fall. Electronically signed by: Morgane Naveau MD 09/15/2024 08:28 PM EST RP Workstation: [id number]   DG Chest Portable 1 View Result Date: 09/15/2024 CLINICAL DATA:  Clemens, rib pain EXAM: PORTABLE CHEST 1 VIEW COMPARISON:  07/18/2014 FINDINGS: Single frontal view of the chest demonstrates an unremarkable cardiac silhouette. No acute airspace disease, effusion, or pneumothorax. There is a minimally displaced left lateral seventh rib fracture, partially obscured by an overlying cardiac lead. No other acute bony abnormalities. IMPRESSION: 1. Minimally displaced left lateral seventh rib fracture. 2. Otherwise no acute  intrathoracic process. Electronically Signed   By: Ozell Daring M.D.   On: 09/15/2024 19:47        Scheduled Meds:  amLODipine   5 mg Oral Daily   feeding supplement  237 mL Oral BID BM   Influenza vac split trivalent PF  0.5 mL Intramuscular Tomorrow-1000   lidocaine   1 patch Transdermal Q24H   losartan   50 mg Oral QHS  pantoprazole   40 mg Oral Daily   sodium chloride  flush  3 mL Intravenous Q12H   venlafaxine  XR  37.5 mg Oral Q breakfast   Continuous Infusions:   LOS: 1 day    Time spent: 35 minutes    Leatrice Chapel, MD  [facility] Pager #: [phone] 7PM-7AM contact night coverage as above

## 2024-09-17 NOTE — Progress Notes (Signed)
 Patient reporting uncontrolled pain and has been tearful since waking. Oxy provided little relief for patient earlier. Per hospice liaison, goals of care for their patients are comfort. IV fentanyl  administered over the PO oxy. Patient reported needing to have BM but again fearful of pain. PT came to bedside to eval patient and got patient up to bsc with this RN. While sitting on bsc, patient's HR dropped to the low 30s, became nonverbal, and started vomiting. Patient was helped back to bed and recovered. IV zofran  administered. Patient's daughter and MD were outside room at this time. Per Dr. Rosario, discontinue tele monitoring. Purple DNR bracelet applied to patient.

## 2024-09-17 NOTE — Progress Notes (Signed)
 Denise Lane [id number] [facility] Liaison Note:   Denise Lane is a current hospice patient with [facility] with a terminal diagnosis of Malnutrition. Family alerted [facility] that patient  transported by [facility] from home for fall and rib pain. Pt lives at home by herself, daughter has ring cameras in the house and saw patient fall on the afternoon of 12.5.25. Patient was complaining of left side rib pain. Patient is admitted for Intracranial bleeding versus cavernous hemangioma and Multiple closed rib fracture. Per Dr. Deane Finder with [facility] this is a related hospital admission. Patient is a DNR.   Patient seen at bedside with daughter Denise Lane present. Patient visibly in pain. Very tearful. Spoke with daughter regarding discharge disposition. Daughter states  she is an only child and struggling with some health concerns. She did say that realistically she is unable to provide around the clock support to patient. She was in the process of trying to get patient into [facility], however she did say that she is open to speaking with [facility] to see what other options are available. Denise Lane verbalized concerns about not knowing what patient current level of mobility is due to not being up in the hospital as of yet. PT/OT evals have been ordered. Spoke with nurse caring for patient regarding concerns of pain management. Nurse will provide IV pain medication vs oral at this time to help control pain level.       Per chart review, patient remains GIP appropriate due to pain management following rib fracture.     Vital Signs: 98.2/68/14    138/63  96% on room air    I/O: 10/800   Abnormal Labs:  None new    Diagnostics:  None new     IV/PRN Meds:  Tylenol  650mg  PO x1, Fentanyl  12.5mg  IV x1, Oxycodone  5mg  PO x3     Assessment and Plan: (none new at this time)  Code Status: DNR   Discharge Planning: ongoing   Family Contact:  Spoke with daughter at bedside.     IDT: updated   Goals of Care: DNR      Should patient need ambulance transfer at discharge- please use [facility]) as they contract this service for our active hospice patients.    Please call with any hospice related questions or concerns.   Denise Lane, BSN, RN [facility] Liaison [phone]

## 2024-09-17 NOTE — Evaluation (Signed)
 Physical Therapy Evaluation Patient Details Name: Denise Lane MRN: [id number] DOB: [birth year] Today's Date: 09/17/2024  History of Present Illness  The pt is a 89 yo female presenting 12/5 after a fall at home. MRI revealed multiple L rib fx 7-10 and 7mm L cerebellar mass vs hemorrhage. PMH includes: dementia, depression, HLD, HTN, osteoarthritis, bilateral THA, basal cell cancer, squamous carcinoma.  Clinical Impression  Pt in bed upon arrival of PT, agreeable to evaluation at this time. Prior to admission the pt was living alone, using a cane to mobilize in her house, while being monitored via cameras by her daughter. The pt now presents with significant pain and stiffness, needing mod-maxA to complete bed mobility and initial transition to sitting EOB. With increased time and modA, pt was able to complete sit-stand transfer and pivotal steps to [facility]. However, after using BSC for 5-10 min, pt with bradycardia to 30s with decreased responsiveness and onset of vomiting. Pt assisted back to bed with totalA, HR returned to 100s and pt began conversing with staff again. As pt is from home alone, she currently needs more assistance than she is able to receive at home either through family or hospice care. Therefore, will continue to follow acutely to progress pt's independence with transfers, but she will likely need to transition to long-term care for increased assistance with all mobility. The pt's daughter is in agreement with this plan.     If plan is discharge home, recommend the following: Two people to help with walking and/or transfers;A lot of help with bathing/dressing/bathroom;Assistance with cooking/housework;Direct supervision/assist for medications management;Direct supervision/assist for financial management;Assist for transportation;Help with stairs or ramp for entrance   Can travel by private vehicle   No    Equipment Recommendations Wheelchair (measurements PT);Wheelchair cushion  (measurements PT);Hospital bed  Recommendations for Other Services       Functional Status Assessment Patient has had a recent decline in their functional status and demonstrates the ability to make significant improvements in function in a reasonable and predictable amount of time.     Precautions / Restrictions Precautions Precautions: Fall Recall of Precautions/Restrictions: Impaired Precaution/Restrictions Comments: vasovagal episode on BSC with brady to 30s with vomiting on 12/7 Restrictions Weight Bearing Restrictions Per Provider Order: No      Mobility  Bed Mobility Overal bed mobility: Needs Assistance Bed Mobility: Supine to Sit, Sit to Supine, Rolling Rolling: Mod assist, Used rails   Supine to sit: Max assist Sit to supine: Total assist, +2 for physical assistance   General bed mobility comments: maxA with sequential cues for LE movements and assist to complete due to pain in hip. totalA to elevate trunk due to pt resisting (suspect due to pain). totalA to return to bed after vasovagal episode on [facility]    Transfers Overall transfer level: Needs assistance Equipment used: 1 person hand held assist Transfers: Sit to/from Stand, Bed to chair/wheelchair/BSC Sit to Stand: Mod assist   Step pivot transfers: Mod assist, +2 safety/equipment, Total assist       General transfer comment: initially modA to rise with hands on BSC for UE support, assist to steady with small pivotal steps, after vasovagal event, pt needing totalA back to bed    Ambulation/Gait               General Gait Details: limted to small pivotal steps from bed to [facility] this session, modA of 2 with BUE support    Balance Overall balance assessment: Needs assistance Sitting-balance support: No upper  extremity supported, Feet supported Sitting balance-Leahy Scale: Poor Sitting balance - Comments: min-modA to maintain sitting balance, could maintain with CGA on BSC Postural control: Posterior  lean Standing balance support: Bilateral upper extremity supported Standing balance-Leahy Scale: Poor Standing balance comment: dependent on BUEsupport                             Pertinent Vitals/Pain Pain Assessment Pain Assessment: Faces Pain Score: 8  Faces Pain Scale: Hurts whole lot Pain Location: bilateral legs, hips, back, L ribs Pain Descriptors / Indicators: Discomfort, Grimacing, Guarding Pain Intervention(s): Limited activity within patient's tolerance, Monitored during session, Premedicated before session, Repositioned    Home Living Family/patient expects to be discharged to:: Private residence Living Arrangements: Alone Available Help at Discharge: Family;Available PRN/intermittently Type of Home: House Home Access: Stairs to enter;Ramped entrance   Entrance Stairs-Number of Steps: uses ramp for the most part   Home Layout: One level Home Equipment: Educational Psychologist (4 wheels);Cane - single point      Prior Function Prior Level of Function : Needs assist             Mobility Comments: pt had caregiver for ADLs until last 2 weeks, then asked daughter to cancel. walked with cane in the home most of the time, reports multiple falls ADLs Comments: pt had aide until last 2 weeks, per daughter was largely independent     Extremity/Trunk Assessment   Upper Extremity Assessment Upper Extremity Assessment: Generalized weakness;Defer to OT evaluation    Lower Extremity Assessment Lower Extremity Assessment: Generalized weakness;RLE deficits/detail RLE Deficits / Details: grossly4-/5 to MMT, poor power and limited activity tolerance RLE Sensation: WNL    Cervical / Trunk Assessment Cervical / Trunk Assessment: Kyphotic;Other exceptions Cervical / Trunk Exceptions: frail  Communication   Communication Communication: No apparent difficulties    Cognition Arousal: Alert Behavior During Therapy: Flat affect   PT - Cognitive impairments:  Memory, Attention, Initiation, Problem solving, Safety/Judgement                       PT - Cognition Comments: pt needing increased time and cues to complete, able to answer questions regarding PLOF. at end of session pt perseverating on her hopes to pass away soon Following commands: Impaired Following commands impaired: Follows one step commands with increased time     Cueing Cueing Techniques: Verbal cues     General Comments General comments (skin integrity, edema, etc.): vasovagal episode while on BSC with brady to 30s and pt vomiting. RN present, assisted pt back to bed and gave zofran . HR returned to 100s and pt conversing after event. daughter present and confirmed DNR    Exercises     Assessment/Plan    PT Assessment Patient needs continued PT services  PT Problem List Decreased strength;Decreased range of motion;Decreased activity tolerance;Decreased balance;Decreased mobility       PT Treatment Interventions DME instruction;Gait training;Functional mobility training;Therapeutic activities;Therapeutic exercise;Balance training;Neuromuscular re-education    PT Goals (Current goals can be found in the Care Plan section)  Acute Rehab PT Goals Patient Stated Goal: to reduce pain PT Goal Formulation: With patient/family Time For Goal Achievement: 10/01/24 Potential to Achieve Goals: Fair    Frequency Min 1X/week        AM-PAC PT 6 Clicks Mobility  Outcome Measure Help needed turning from your back to your side while in a flat bed without using bedrails?: A Lot Help  needed moving from lying on your back to sitting on the side of a flat bed without using bedrails?: A Lot Help needed moving to and from a bed to a chair (including a wheelchair)?: Total Help needed standing up from a chair using your arms (e.g., wheelchair or bedside chair)?: Total Help needed to walk in hospital room?: Total Help needed climbing 3-5 steps with a railing? : Total 6 Click  Score: 8    End of Session Equipment Utilized During Treatment: Gait belt Activity Tolerance: Treatment limited secondary to medical complications (Comment) Patient left: in bed;with call bell/phone within reach;with bed alarm set;with family/visitor present Nurse Communication: Mobility status PT Visit Diagnosis: Unsteadiness on feet (R26.81);Muscle weakness (generalized) (M62.81);History of falling (Z91.81);Repeated falls (R29.6);Difficulty in walking, not elsewhere classified (R26.2)    Time: 8563-8483 PT Time Calculation (min) (ACUTE ONLY): 40 min   Charges:   PT Evaluation $PT Eval Moderate Complexity: 1 Mod PT Treatments $Therapeutic Exercise: 8-22 mins $Therapeutic Activity: 8-22 mins PT General Charges $$ ACUTE PT VISIT: 1 Visit         Izetta Call, PT, DPT   Acute Rehabilitation Department Office [phone] Secure Chat Communication Preferred  Izetta JULIANNA Call 09/17/2024, 5:53 PM

## 2024-09-18 DIAGNOSIS — I629 Nontraumatic intracranial hemorrhage, unspecified: Secondary | ICD-10-CM | POA: Diagnosis not present

## 2024-09-18 NOTE — Evaluation (Addendum)
 Occupational Therapy Evaluation Patient Details Name: Denise Lane MRN: [id number] DOB: 03/13/1930 Today's Date: 09/18/2024   History of Present Illness   The pt is a 89 yo female presenting 12/5 after a fall at home. MRI revealed multiple L rib fx 7-10 and 7mm L cerebellar mass vs hemorrhage. PMH includes: dementia, depression, HLD, HTN, osteoarthritis, bilateral THA, basal cell cancer, squamous carcinoma.     Clinical Impressions [name] was evaluated s/p the above admission list. She lives alone, has assist from her daughter and had had recent falls at baseline. Upon evaluation the pt was limited by L flank pain, generalized weakness, unsteady sitting balance and poor activity tolerance. Overall she needed max-total A for bed mobility and close CGA for sitting balance. Pt was unable to progress to standing due to pain. Due to the deficits listed below the pt also needs total A for LB ADLs and up to mid A with cues for UB ADLs. Pt will benefit from continued acute OT services and skilled inpatient follow up therapy, <3 hours/day.      If plan is discharge home, recommend the following:   A lot of help with walking and/or transfers;A lot of help with bathing/dressing/bathroom;Assistance with feeding;Assistance with cooking/housework;Direct supervision/assist for medications management;Assist for transportation;Direct supervision/assist for financial management;Help with stairs or ramp for entrance;Supervision due to cognitive status     Functional Status Assessment   Patient has had a recent decline in their functional status and demonstrates the ability to make significant improvements in function in a reasonable and predictable amount of time.     Equipment Recommendations   None recommended by OT;Other (comment) (defer)      Precautions/Restrictions   Precautions Precautions: Fall Recall of Precautions/Restrictions: Impaired Restrictions Weight Bearing  Restrictions Per Provider Order: No     Mobility Bed Mobility Overal bed mobility: Needs Assistance Bed Mobility: Supine to Sit, Sit to Supine     Supine to sit: Max assist Sit to supine: Total assist   General bed mobility comments: pt able to move legs toward teh EOB but needed assist with hips and trunk, she was unable to lift legs back into the bed    Transfers                   General transfer comment: declined      Balance Overall balance assessment: Needs assistance Sitting-balance support: No upper extremity supported, Feet supported Sitting balance-Leahy Scale: Fair Sitting balance - Comments: close CGA                                   ADL either performed or assessed with clinical judgement   ADL Overall ADL's : Needs assistance/impaired Eating/Feeding: Set up;Sitting   Grooming: Minimal assistance;Sitting   Upper Body Bathing: Minimal assistance;Sitting   Lower Body Bathing: Total assistance   Upper Body Dressing : Minimal assistance   Lower Body Dressing: Total assistance   Toilet Transfer: Total assistance   Toileting- Clothing Manipulation and Hygiene: Total assistance       Functional mobility during ADLs: Maximal assistance;+2 for physical assistance;+2 for safety/equipment General ADL Comments: pt was limited by pain and generalized weakness     Vision Baseline Vision/History: 0 No visual deficits Vision Assessment?: No apparent visual deficits     Perception Perception: Within Functional Limits       Praxis Praxis: WFL       Pertinent Vitals/Pain Pain  Assessment Pain Assessment: Faces Faces Pain Scale: Hurts whole lot Pain Location: L flank Pain Descriptors / Indicators: Discomfort, Grimacing, Guarding Pain Intervention(s): Limited activity within patient's tolerance, Monitored during session     Extremity/Trunk Assessment Upper Extremity Assessment Upper Extremity Assessment: Generalized weakness        Cervical / Trunk Assessment Cervical / Trunk Assessment: Kyphotic;Other exceptions Cervical / Trunk Exceptions: frail   Communication Communication Communication: No apparent difficulties   Cognition Arousal: Alert Behavior During Therapy: Flat affect                                 Following commands: Impaired Following commands impaired: Follows one step commands with increased time     Cueing  General Comments   Cueing Techniques: Verbal cues  VSS           Home Living Family/patient expects to be discharged to:: Private residence Living Arrangements: Alone Available Help at Discharge: Family;Available PRN/intermittently Type of Home: House Home Access: Stairs to enter;Ramped entrance Entrance Stairs-Number of Steps: uses ramp for the most part Entrance Stairs-Rails: Right;Left Home Layout: One level     Bathroom Shower/Tub: Sponge bathes at baseline   [employer]: Standard     Home Equipment: Educational Psychologist (4 wheels);Cane - single point          Prior Functioning/Environment Prior Level of Function : Needs assist             Mobility Comments: pt had caregiver for ADLs until last 2 weeks, then asked daughter to cancel. walked with cane in the home most of the time, reports multiple falls ADLs Comments: pt had aide until last 2 weeks, per daughter was largely independent    OT Problem List: Decreased strength;Decreased range of motion;Impaired balance (sitting and/or standing);Decreased activity tolerance;Decreased safety awareness;Decreased knowledge of use of DME or AE;Decreased knowledge of precautions;Pain   OT Treatment/Interventions: Self-care/ADL training;DME and/or AE instruction;Therapeutic activities;Patient/family education;Balance training      OT Goals(Current goals can be found in the care plan section)   Acute Rehab OT Goals Patient Stated Goal: less pain OT Goal Formulation: With patient Time For  Goal Achievement: 10/02/24 Potential to Achieve Goals: Fair ADL Goals Pt Will Perform Upper Body Dressing: with set-up Pt Will Perform Lower Body Dressing: with mod assist;sit to/from stand Pt Will Transfer to Toilet: with min assist;stand pivot transfer;bedside commode Additional ADL Goal #1: Pt will complete bed mobility with min A as a precursor to ADLs   OT Frequency:  Min 1X/week       AM-PAC OT 6 Clicks Daily Activity     Outcome Measure Help from another person eating meals?: A Little Help from another person taking care of personal grooming?: A Little Help from another person toileting, which includes using toliet, bedpan, or urinal?: Total Help from another person bathing (including washing, rinsing, drying)?: A Lot Help from another person to put on and taking off regular upper body clothing?: A Little Help from another person to put on and taking off regular lower body clothing?: Total 6 Click Score: 13   End of Session Nurse Communication: Mobility status  Activity Tolerance: Patient limited by pain Patient left: in bed;with call bell/phone within reach;with bed alarm set  OT Visit Diagnosis: Unsteadiness on feet (R26.81);Other abnormalities of gait and mobility (R26.89);Muscle weakness (generalized) (M62.81);History of falling (Z91.81);Repeated falls (R29.6)  Time: 8963-8942 OT Time Calculation (min): 21 min Charges:  OT General Charges $OT Visit: 1 Visit OT Evaluation $OT Eval Moderate Complexity: 1 Mod  Lucie Kendall, OTR/L Acute Rehabilitation Services Office [phone] Secure Chat Communication Preferred   Lucie JONETTA Kendall 09/18/2024, 11:34 AM

## 2024-09-18 NOTE — Progress Notes (Signed)
 Denise Lane [id number] [facility] Liaison Note:   Denise Lane is a current hospice patient with [facility] with a terminal diagnosis of Malnutrition. Family alerted [facility] that patient transported by [facility] from home for fall and rib pain. Pt lives at home by herself, daughter has ring cameras in the house and saw patient fall on the afternoon of 12.5.25. Patient was complaining of left side rib pain. Patient is admitted for Intracranial bleeding versus cavernous hemangioma and Multiple closed rib fracture. Per Dr. Deane Finder with [facility] this is a related hospital admission. Patient is a DNR.   Patient seen at bedside with no family present. She was alert and oriented x3 but was complaining of left sided rib pain. Called and spoke at length with her daughter Denise Lane about discharge disposition. She feels that she is being pushed into making a decision but she has hired an pensions consultant to help her with the Medicaid application process and says she needs time to deal with that. Update was given to ICM who said that she and the SW will speak with [facility] tomorrow.       Per chart review, patient remains GIP appropriate due to pain management following multiple rib fractures.   Vital Signs: 97.7/65/18    119/59  92% on room air    I/O: not documented   Abnormal Labs: none new   Diagnostics: none new   IV/PRN Meds:  Tylenol  650mg  PO x1, Oxycodone  5mg  PO x1, Fentanyl  12.5-25mcg IV x1, Zofran  4mg  x1     Assessment and Plan per Leatrice Chapel, MD on 12.7.25 Assessment & Plan:   Principal Problem:   Intracranial bleeding (HCC) Active Problems:   Hyperlipidemia   Essential hypertension   GERD (gastroesophageal reflux disease)   Multiple rib fractures   Vascular dementia (HCC)   Generalized anxiety disorder     Intracranial bleeding versus cavernous hemangioma Multiple closed rib fracture  Mechanical fall -Present emergency department for  evaluation for mechanical fall.  Patient found to have multiple rib fracture and intracranial bleeding versus cavernous hemangioma. -Chest x-ray showed minimally displaced left lateral rib fracture.  CT chest abdomen pelvis showed acute displaced fracture 7-10. EDP discussed case with general surgery recommended pain control. -CT head 7 mm left cerebellar mass versus hemorrhage.  MRI brain showing left  cerebellum and left thalamus with surrounding edema. These could be due to underling cavernous malformations (favored given characteristic T2 hypointensity) or hypertensive hemorrhages given evidence of other prior microhemorrhages, but given the atypical central enhancement hemorrhagic metastases is not excluded. Recommend a follow up MRI with contrast in 4-6 weeks. - As per collateral information: Case was discussed with on-call neurosurgery, Dr. Meryl.  Neurosurgery reviewed the imaging and stated that patient possibly has intracranial bleeding vs cavernous hemangioma however it is very difficult to differentiate.  Per neurosurgery at this time there is no need for repeat imaging or hypertonic saline, neurosurgery will evaluate patient in the morning. - Pursue disposition. - Adequate pain control. -PT OT consult. -Appreciate neurosurgery and trauma surgery consult and recommendation.         Code Status: DNR   Discharge Planning: ongoing   Family Contact:  Spoke with daughter by phone.   IDT: updated   Goals of Care: DNR   Medication list and Transfer Summary placed under Media Tab.     Should patient need ambulance transfer at discharge- please use [facility]) as they contract this service for our active hospice patients.  Please call with any hospice related questions or concerns.   Greig Basket, BSN, RN [facility] Liaison [phone]

## 2024-09-18 NOTE — Progress Notes (Addendum)
 PROGRESS NOTE    Denise Lane  FMW:[id number] DOB: 02-19-1930 DOA: 09/15/2024 PCP: [name], NP  Outpatient Specialists:     Brief Narrative:  Patient is a 89 year old female with history of vascular dementia, hypertension, hyperlipidemia, hemochromatosis, nephrolithiasis, arthritis and GERD.  She was admitted to the hospital following a fall.  MRI of the brain revealed acute/recent subcentimeter hemorrhages in the left cerebellum and left thalamus with surrounding edema.  CT chest, abdomen and pelvis revealed acute displaced comminuted left anterior 7th, 8th, 9th and 10th rib fractures with possible acute nondisplaced posterior left ninth rib fracture.  Patient was under the hospice team prior to presentation/for.  Neurosurgery does not plan any surgical intervention.  PT/OT was consulted, and his SNF has been recommended.  Transition of care team has been consulted to assist with patient's discharge.    09/17/2024: Patient seen alongside patient's nurses.  Also discussed with patient's daughter extensively.  Apparently, patient developed nausea and vomiting secondary to fentanyl  administration, with associated vasovagal episode.  Patient's heart rate dropped to the 20s to 30s, but resolved almost immediately.  Patient is DNR.  Patient's daughter does not want cardiac monitoring on.  No significant history from patient..    09/28/2024: Patient seen.  No new complaints.  Patient has remained stable.  PT/OT input is highly appreciated, SNF has been recommended.  Pursue disposition.   Assessment & Plan:   Principal Problem:   Intracranial bleeding (HCC) Active Problems:   Hyperlipidemia   Essential hypertension   GERD (gastroesophageal reflux disease)   Multiple rib fractures   Vascular dementia (HCC)   Generalized anxiety disorder   Intracranial bleeding versus cavernous hemangioma Multiple closed rib fracture  Mechanical fall -Patient presented to emergency department  following mechanical fall.   - Last fall was in March/April of this year. - Patient was found to have multiple rib fracture and intracranial bleeding versus cavernous hemangioma. -Chest x-ray revealed minimally displaced left lateral rib fracture.   -CT chest abdomen pelvis revealed acute displaced fracture 7-10.  - Emergency room provider discussed case with general surgery and pain control was recommended (supportive management).   -CT head revealed 7 mm left cerebellar mass versus hemorrhage. - MRI brain revealed findings compatible with acute/recent subcentimeter hemorrhages in the left cerebellum and left thalamus with surrounding edema (these could be due to underling cavernous malformations or hypertensive hemorrhages given evidence of other prior microhemorrhages), but given the atypical central enhancement hemorrhagic metastases is not excluded. Recommend a follow up MRI with contrast in 4-6 weeks. - As per collateral information: Case was discussed with on-call neurosurgery, Dr. Meryl.  Neurosurgery reviewed the imaging and stated that patient possibly has intracranial bleeding vs cavernous hemangioma however it is very difficult to differentiate.  Per neurosurgery at this time there is no need for repeat imaging or hypertonic saline, neurosurgery will evaluate patient in the morning. - Adequate pain control. -PT/OT input is appreciated.  SNF has been recommended. - Pursue disposition.   Essential hypertension - Controlled.   GERD -Continue Protonix    History of vascular dementia History of generalized anxiety disorder -Continue Effexor    DVT prophylaxis:  Code Status: DO NOT RESUSCITATE.  Patient is under hospice Family Communication: Patient's daughter Disposition Plan:    Consultants:  None  Procedures:  None  Antimicrobials:  None   Subjective: - No new complaints  Objective: Vitals:   09/18/24 1137 09/18/24 1142 09/18/24 1146 09/18/24 1509  BP: (!) 119/59    124/69  Pulse:  65   68  Resp: 18   20  Temp: 97.7 F (36.5 C)   98 F (36.7 C)  TempSrc: Oral   Oral  SpO2: (!) 87% 92% 99% 95%  Weight:      Height:        Intake/Output Summary (Last 24 hours) at 09/18/2024 1730 Last data filed at 09/18/2024 0433 Gross per 24 hour  Intake --  Output 700 ml  Net -700 ml   Filed Weights   09/16/24 1350  Weight: 49.4 kg    Examination:  General exam: Appears calm and comfortable  Respiratory system: Clear to auscultation.  Cardiovascular system: S1 & S2 heard Gastrointestinal system: Abdomen is soft and nontender.  Central nervous system: Awake and alert. Extremities: No leg edema  Data Reviewed: I have personally reviewed following labs and imaging studies  CBC: Recent Labs  Lab 09/15/24 2148 09/16/24 0717  WBC 8.8 6.0  NEUTROABS 5.8  --   HGB 11.5* 11.7*  HCT 35.3* 35.0*  MCV 103.5* 101.4*  PLT 243 231   Basic Metabolic Panel: Recent Labs  Lab 09/15/24 2148 09/16/24 0717  NA 138 139  K 3.5 3.6  CL 98 97*  CO2 30 31  GLUCOSE 121* 111*  BUN 12 11  CREATININE 0.64 0.70  CALCIUM  9.1 8.6*  MG 2.0  --    GFR: Estimated Creatinine Clearance: 35 mL/min (by C-G formula based on SCr of 0.7 mg/dL). Liver Function Tests: Recent Labs  Lab 09/15/24 2148  AST 22  ALT 12  ALKPHOS 73  BILITOT 0.5  PROT 5.9*  ALBUMIN  3.2*   No results for input(s): LIPASE, AMYLASE in the last 168 hours. No results for input(s): AMMONIA in the last 168 hours. Coagulation Profile: No results for input(s): INR, PROTIME in the last 168 hours. Cardiac Enzymes: No results for input(s): CKTOTAL, CKMB, CKMBINDEX, TROPONINI in the last 168 hours. BNP (last 3 results) No results for input(s): PROBNP in the last 8760 hours. HbA1C: No results for input(s): HGBA1C in the last 72 hours. CBG: No results for input(s): GLUCAP in the last 168 hours. Lipid Profile: No results for input(s): CHOL, HDL, LDLCALC, TRIG,  CHOLHDL, LDLDIRECT in the last 72 hours. Thyroid  Function Tests: No results for input(s): TSH, T4TOTAL, FREET4, T3FREE, THYROIDAB in the last 72 hours. Anemia Panel: No results for input(s): VITAMINB12, FOLATE, FERRITIN, TIBC, IRON, RETICCTPCT in the last 72 hours. Urine analysis:    Component Value Date/Time   COLORURINE STRAW (A) 09/15/2024 2335   APPEARANCEUR CLEAR 09/15/2024 2335   LABSPEC 1.008 09/15/2024 2335   PHURINE 7.0 09/15/2024 2335   GLUCOSEU NEGATIVE 09/15/2024 2335   HGBUR NEGATIVE 09/15/2024 2335   BILIRUBINUR NEGATIVE 09/15/2024 2335   BILIRUBINUR small 07/24/2019 1253   KETONESUR NEGATIVE 09/15/2024 2335   PROTEINUR NEGATIVE 09/15/2024 2335   UROBILINOGEN negative (A) 07/24/2019 1253   UROBILINOGEN 0.2 04/07/2014 0309   NITRITE NEGATIVE 09/15/2024 2335   LEUKOCYTESUR NEGATIVE 09/15/2024 2335   Sepsis Labs: @LABRCNTIP (procalcitonin:4,lacticidven:4)  )No results found for this or any previous visit (from the past 240 hours).       Radiology Studies: CT CHEST ABDOMEN PELVIS W CONTRAST Result Date: 09/16/2024 EXAM: CT CHEST, ABDOMEN AND PELVIS WITH CONTRAST 09/16/2024 01:35:22 AM TECHNIQUE: CT of the chest, abdomen and pelvis was performed with the administration of 75 mL of iohexol  (OMNIPAQUE ) 350 MG/ML injection. Multiplanar reformatted images are provided for review. Automated exposure control, iterative reconstruction, and/or weight based adjustment of the mA/kV was utilized  to reduce the radiation dose to as low as reasonably achievable. COMPARISON: None available. CLINICAL HISTORY: L rib fx w/ back pain FINDINGS: CHEST: MEDIASTINUM AND LYMPH NODES: Heart: Aortic valve leaflet calcification. 4-vessel coronary artery calcification. Pericardium is unremarkable. The central airways are clear. No mediastinal, hilar or axillary lymphadenopathy. LUNGS AND PLEURA: Biapical pleural and pulmonary scarring. No focal consolidation or pulmonary  edema. No pleural effusion or pneumothorax. VASCULATURE: Severe atherosclerotic plaque of the aorta. BONES AND SOFT TISSUES: Acute displaced comminuted left anterior seventh, eighth, ninth, and tenth rib fractures. Possible acute nondisplaced posterior left ninth rib fracture. ABDOMEN AND PELVIS: LIVER: Chronic Metallic densities noted along the liver right lobe. GALLBLADDER AND BILE DUCTS: Status post cholecystectomy. No biliary ductal dilatation. SPLEEN: No acute abnormality. PANCREAS: Diffusely atrophic. No focal lesion. Otherwise normal pancreatic contour. No surrounding inflammatory changes. No main pancreatic ductal dilatation. ADRENAL GLANDS: No acute abnormality. KIDNEYS, URETERS AND BLADDER: No stones in the kidneys or ureters. No hydronephrosis. No perinephric or periureteral stranding. Urinary bladder is unremarkable. GI AND BOWEL: Colonic diverticulosis. No small or large bowel thickening or dilatation. Stomach demonstrates no acute abnormality. There is no bowel obstruction. REPRODUCTIVE ORGANS: No acute abnormality. PERITONEUM AND RETROPERITONEUM: No ascites. No free air. VASCULATURE: Aorta is normal in caliber. ABDOMINAL AND PELVIS LYMPH NODES: No lymphadenopathy. REPRODUCTIVE ORGANS: No acute abnormality. BONES AND SOFT TISSUES: Bilateral total hip arthroplasties partially visualized. Diffusely decreased bone density. Dextroscoliosis of the thoracolumbar spine centered at the L2-L3 level with associated severe degenerative changes. No focal soft tissue abnormality. IMPRESSION: 1. Acute displaced comminuted left anterior seventh, eighth, ninth, and tenth rib fractures with possible acute nondisplaced posterior left ninth rib fracture. 2. Severe atherosclerotic plaque of the aorta with aortic valve leaflet and multivessel coronary artery calcifications. 3. Colonic diverticulosis without evidence of diverticulitis. 4. Other, non-acute and/or normal findings as above. Electronically signed by: Morgane  Naveau MD 09/16/2024 02:04 AM EST RP Workstation: [id number]   MR Brain W and Wo Contrast Result Date: 09/16/2024 EXAM: MRI BRAIN WITH AND WITHOUT CONTRAST 09/16/2024 12:13:38 AM TECHNIQUE: Multiplanar multisequence MRI of the head/brain was performed with and without the administration of intravenous contrast. COMPARISON: CT Head earlier today CLINICAL HISTORY: Neuro deficit, acute, stroke suspected FINDINGS: BRAIN AND VENTRICLES: Approximately 5 mm focus of T2 hypointensity and subtle artifact in the left cerebellum with surrounding edema, compatible with acute or recent hemorrhage. This area demonstrates a central focus of enhancement (series 16, image 19). Additional focus of more subtle 3-4 mm T2 hypointensity in the left thalamus with surrounding edema (for example, see series 10, image 14 and series 11, image 28) which also demonstrates a punctate focus of enhancement centrally. Additional punctate foci of susceptibility artifact in the right thalamus and left basal ganglia are compatible with chronic microhemorrhages (likely hypertensive in etiology given distribution). Moderate to advanced scattered T2 hyperintensities in the white matter, compatible with chronic microvascular ischemic change. No mass effect or midline shift. No hydrocephalus. Normal flow voids. ORBITS: No acute abnormality. SINUSES: No acute abnormality. BONES AND SOFT TISSUES: Normal bone marrow signal and enhancement. No acute soft tissue abnormality. IMPRESSION: 1. Findings compatible with acute/recent subcentimeter hemorrhages in the left cerebellum and left thalamus with surrounding edema. These could be due to underling cavernous malformations (favored given characteristic T2 hypointensity) or hypertensive hemorrhages given evidence of other prior microhemorrhages, but given the atypical central enhancement hemorrhagic metastases is not excluded. Recommend a follow up MRI with contrast in 4-6 weeks. 2. Moderate to advanced chronic  microvascular ischemic change. Electronically signed by: Gilmore Molt MD 09/16/2024 01:09 AM EST RP Workstation: [id number]   CT Head Wo Contrast Addendum Date: 09/15/2024 ADDENDUM #1  ADDENDUM: These findings were discussed with doctor Belfi by Dr. Margarite over the phone on 09/15/2024 at 08:30 pm ---------------------------------------------------- Electronically signed by: Morgane Naveau MD 09/15/2024 08:30 PM EST RP Workstation: [id number]   Result Date: 09/15/2024  ORIGINAL REPORT EXAM: CT HEAD WITHOUT CONTRAST 09/15/2024 08:11:00 PM TECHNIQUE: CT of the head was performed without the administration of intravenous contrast. Automated exposure control, iterative reconstruction, and/or weight based adjustment of the mA/kV was utilized to reduce the radiation dose to as low as reasonably achievable. COMPARISON: CT head 06:27:15 CLINICAL HISTORY: fall FINDINGS: BRAIN AND VENTRICLES: 7 mm hyperdensity of the left cerebellar hemisphere (3:8). May represent a mass versus hemorrhage. Cerebral ventricle sizes are concordant with the degree of cerebral volume loss. Patchy and confluent areas of decreased attenuation are noted throughout the deep and periventricular white matter of the cerebral hemispheres bilaterally suggestive of chronic microvascular ischemic changes. Question bilateral basal ganglia loss of gray-white matter differentiation. Atherosclerotic calcifications are present within the cavernous internal carotid and vertebral arteries. No extra-axial collection. No mass effect or midline shift. ORBITS: No acute abnormality. SINUSES: No acute abnormality. SOFT TISSUES AND SKULL: No acute soft tissue abnormality. No skull fracture. IMPRESSION: 1. A 7 mm hyperdensity in the left cerebellar hemisphere, possibly representing a mass versus hemorrhage. 2. Question bilateral basal ganglia loss of gray-white matter differentiation. Findings may represent chronic severe microvascular ischemic changes but cannot  exclude other etiologies such as acute infarction or other causes of edema. 3. Recommend MRI head with and without contrast for further evaluation. Electronically signed by: Morgane Naveau MD 09/15/2024 08:25 PM EST RP Workstation: [id number]   CT Cervical Spine Wo Contrast Result Date: 09/15/2024 EXAM: CT CERVICAL SPINE WITHOUT CONTRAST 09/15/2024 08:11:00 PM TECHNIQUE: CT of the cervical spine was performed without the administration of intravenous contrast. Multiplanar reformatted images are provided for review. Automated exposure control, iterative reconstruction, and/or weight based adjustment of the mA/kV was utilized to reduce the radiation dose to as low as reasonably achievable. COMPARISON: None available. CLINICAL HISTORY: fall fall FINDINGS: CERVICAL SPINE: BONES AND ALIGNMENT: Diffusely decreased bone density. Grade 1 anterolisthesis of C4 on C5, C5 on C6, C6 on C7, C7 on T1, T1 on T2. No acute fracture or traumatic malalignment. DEGENERATIVE CHANGES: Multilevel moderate-to-severe degenerative changes of the spine. Question associated severe osseous neural foraminal stenosis of the right C5-C6 level. No severe osseous central canal stenosis. SOFT TISSUES: No prevertebral soft tissue swelling. Atherosclerotic plaque of the carotid arteries within the neck. LUNGS: Biapical marked pleural and pulmonary scarring. Likely mild centrilobular emphysematous changes. HEPATOBILIARY: Trace hepatic bile duct wall thickening. IMPRESSION: 1. No acute abnormality of the cervical spine related to the fall. Electronically signed by: Morgane Naveau MD 09/15/2024 08:28 PM EST RP Workstation: [id number]   DG Chest Portable 1 View Result Date: 09/15/2024 CLINICAL DATA:  Clemens, rib pain EXAM: PORTABLE CHEST 1 VIEW COMPARISON:  07/18/2014 FINDINGS: Single frontal view of the chest demonstrates an unremarkable cardiac silhouette. No acute airspace disease, effusion, or pneumothorax. There is a minimally displaced left  lateral seventh rib fracture, partially obscured by an overlying cardiac lead. No other acute bony abnormalities. IMPRESSION: 1. Minimally displaced left lateral seventh rib fracture. 2. Otherwise no acute intrathoracic process. Electronically Signed   By: Ozell Daring M.D.   On: 09/15/2024 19:47  Scheduled Meds:  amLODipine   5 mg Oral Daily   feeding supplement  237 mL Oral BID BM   Influenza vac split trivalent PF  0.5 mL Intramuscular Tomorrow-1000   lidocaine   1 patch Transdermal Q24H   losartan   50 mg Oral QHS   pantoprazole   40 mg Oral Daily   sodium chloride  flush  3 mL Intravenous Q12H   venlafaxine  XR  37.5 mg Oral Q breakfast   Continuous Infusions:   LOS: 2 days    Time spent: 35 minutes    Leatrice Chapel, MD  [facility] Pager #: [phone] 7PM-7AM contact night coverage as above

## 2024-09-18 NOTE — Plan of Care (Signed)

## 2024-09-19 MED ORDER — LORAZEPAM 2 MG/ML IJ SOLN: 1.0000 mg | INTRAMUSCULAR | Status: DC | PRN

## 2024-09-19 MED ORDER — LORAZEPAM 1 MG PO TABS: 1.0000 mg | ORAL_TABLET | ORAL | Status: DC | PRN

## 2024-09-19 MED ORDER — LORAZEPAM 2 MG/ML PO CONC: 1.0000 mg | ORAL | Status: DC | PRN

## 2024-09-19 MED ORDER — GLYCOPYRROLATE 0.2 MG/ML IJ SOLN: 0.2000 mg | INTRAMUSCULAR | Status: DC | PRN

## 2024-09-19 MED ORDER — HALOPERIDOL LACTATE 5 MG/ML IJ SOLN: 0.5000 mg | INTRAMUSCULAR | Status: DC | PRN

## 2024-09-19 MED ORDER — POLYVINYL ALCOHOL 1.4 % OP SOLN: 1.0000 [drp] | Freq: Four times a day (QID) | OPHTHALMIC | 0 refills | Status: DC | PRN

## 2024-09-19 MED ORDER — HALOPERIDOL LACTATE 2 MG/ML PO CONC: 0.5000 mg | ORAL | Status: DC | PRN

## 2024-09-19 MED ORDER — HALOPERIDOL 0.5 MG PO TABS: 0.5000 mg | ORAL_TABLET | ORAL | Status: DC | PRN

## 2024-09-19 MED ORDER — GLYCOPYRROLATE 1 MG PO TABS: 1.0000 mg | ORAL_TABLET | ORAL | Status: DC | PRN

## 2024-09-19 MED ORDER — POLYVINYL ALCOHOL 1.4 % OP SOLN: 1.0000 [drp] | Freq: Four times a day (QID) | OPHTHALMIC | Status: DC | PRN

## 2024-09-19 NOTE — Discharge Summary (Signed)
 Physician Discharge Summary   Patient: Denise Lane MRN: [id number] DOB: 27-May-1930  Admit date:     09/15/2024  Discharge date: 09/19/24  Discharge Physician: Reyes VEAR Gaw   PCP: [name], NP    Discharge Diagnoses: Principal Problem:   Intracranial bleeding [facility]) Active Problems:   Multiple rib fractures   Essential hypertension   GERD (gastroesophageal reflux disease)   Hyperlipidemia   Vascular dementia (HCC)   Generalized anxiety disorder  Resolved Problems:   * No resolved hospital problems. *  Hospital Course: Brief Narrative:  Patient is a 89 year old female with history of vascular dementia, hypertension, hyperlipidemia, hemochromatosis, nephrolithiasis, arthritis and GERD.  She was admitted to the hospital following a fall.  MRI of the brain revealed acute/recent subcentimeter hemorrhages in the left cerebellum and left thalamus with surrounding edema.  CT chest, abdomen and pelvis revealed acute displaced comminuted left anterior 7th, 8th, 9th and 10th rib fractures with possible acute nondisplaced posterior left ninth rib fracture.  Patient was under the hospice team prior to presentation/for.  Neurosurgery does not plan any surgical intervention.  PT/OT was consulted, and his SNF has been recommended.  Transition of care team has been consulted to assist with patient's discharge.     09/19/2024: Patient seen earlier this AM, by herself.  No complaints.  When daughter arrived, nursing alerted me that daughter and patient had spoken and pt would like to pursue full comfort care.  Discussed with Palliative care-->plan is now to move to full comfort care with hopeful DC to [facility].   Assessment & Plan:   Principal Problem:   Intracranial bleeding (HCC) Active Problems:   Multiple rib fractures   Essential hypertension   GERD (gastroesophageal reflux disease)   Hyperlipidemia   Vascular dementia (HCC)   Generalized anxiety disorder      Intracranial bleeding versus cavernous hemangioma Multiple closed rib fracture  Mechanical fall -Patient presented to emergency department following mechanical fall.   - Patient was found to have multiple rib fracture and intracranial bleeding versus cavernous hemangioma. -Chest x-ray revealed minimally displaced left lateral rib fracture.   -CT chest abdomen pelvis revealed acute displaced fracture 7-10.  - Emergency room provider discussed case with general surgery and pain control was recommended (supportive management).   -CT head revealed 7 mm left cerebellar mass versus hemorrhage. - MRI brain revealed findings compatible with acute/recent subcentimeter hemorrhages in the left cerebellum and left thalamus with surrounding edema (these could be due to underling cavernous malformations or hypertensive hemorrhages given evidence of other prior microhemorrhages), but given the atypical central enhancement hemorrhagic metastases is not excluded. Recommend a follow up MRI with contrast in 4-6 weeks. - As per collateral information: Case was discussed with on-call neurosurgery, Dr. Meryl.  Neurosurgery reviewed the imaging and stated that patient possibly has intracranial bleeding vs cavernous hemangioma however it is very difficult to differentiate.  Per neurosurgery at this time there is no need for repeat imaging or hypertonic saline, neurosurgery will evaluate patient in the morning. - Adequate pain control. -PT/OT input is appreciated.  SNF has been recommended. - now full comfort care   Essential hypertension - Controlled on home BP meds.     GERD -on protonix  while in house.    History of vascular dementia History of generalized anxiety disorder - was on effexor  prior to admission.  Continued  in house, stopped at DC       Consultants: Palliative care, neurosurgeruy Disposition: Hospice care Diet recommendation:  Discharge Diet Orders (From admission, onward)     Start      Ordered   09/19/24 0000  Diet - low sodium heart healthy        09/19/24 1347           Regular diet DISCHARGE MEDICATION: Allergies as of 09/19/2024       Reactions   Codeine Nausea And Vomiting   Dilaudid [hydromorphone Hcl] Hives, Other (See Comments)   whelps (10/18/2012)   Macrodantin Hives, Other (See Comments)   drug fever; chills; felt terrible (10/18/2012)   Morphine And Codeine Hives, Nausea And Vomiting   Sulfa Antibiotics Hives, Rash, Other (See Comments)   got real sick (10/18/2012)   Ibuprofen  Other (See Comments)   felt like I have to go to the bathroom constantly Cystitis - NO NSAIDS        Medication List     STOP taking these medications    cholecalciferol  1000 units tablet Commonly known as: VITAMIN D   cyanocobalamin  1000 MCG tablet   gabapentin  300 MG capsule Commonly known as: NEURONTIN    Krill Oil 350 MG Caps   oxybutynin  5 MG tablet Commonly known as: DITROPAN    PreserVision AREDS 2 Caps       TAKE these medications    acetaminophen  500 MG tablet Commonly known as: TYLENOL  Take 1,000 mg by mouth in the morning and at bedtime.   amLODipine  5 MG tablet Commonly known as: NORVASC  Take 1 tablet (5 mg total) by mouth daily. Start if Systolic BP above 140 mmHg   artificial tears ophthalmic solution Place 1 drop into both eyes 4 (four) times daily as needed for dry eyes.   carboxymethylcellulose 0.5 % Soln Commonly known as: REFRESH PLUS Place 1 drop into both eyes 3 (three) times daily as needed. Uses more frequently in left eye as needed.   [employer] Lidocaine  Pain-Relieving 4 % Generic drug: lidocaine  Place 2 patches onto the skin daily.   losartan  50 MG tablet Commonly known as: COZAAR  Take 1 tablet (50 mg total) by mouth at bedtime. Skip the dose if SYSTOLIC BP <130   pantoprazole  40 MG tablet Commonly known as: PROTONIX  TAKE 1 TABLET EVERY DAY   venlafaxine  XR 37.5 MG 24 hr capsule Commonly known as: EFFEXOR -XR TAKE  1 CAPSULE EVERY DAY WITH BREAKFAST        Discharge Exam: Filed Weights   09/16/24 1350  Weight: 49.4 kg   Gen:  Alert, cooperative patient who appears stated age in no acute distress.  Vital signs reviewed.  Thin, frail appearing Cardiac:  Regular rate and rhythm without murmur auscultated.  Good S1/S2. Pulm:  Clear to auscultation bilaterally with good air movement.  No wheezes or rales noted.   Abd:  S/ND/NT Ext:  No LE edema Neuro:  Awake and alert.  Oriented to person and place.  Moving all limbs symmetrically.   Condition at discharge: fair  The results of significant diagnostics from this hospitalization (including imaging, microbiology, ancillary and laboratory) are listed below for reference.   Labs: CBC: Recent Labs  Lab 09/15/24 2148 09/16/24 0717  WBC 8.8 6.0  NEUTROABS 5.8  --   HGB 11.5* 11.7*  HCT 35.3* 35.0*  MCV 103.5* 101.4*  PLT 243 231   Basic Metabolic Panel: Recent Labs  Lab 09/15/24 2148 09/16/24 0717  NA 138 139  K 3.5 3.6  CL 98 97*  CO2 30 31  GLUCOSE 121* 111*  BUN 12 11  CREATININE 0.64 0.70  CALCIUM  9.1 8.6*  MG 2.0  --    Liver Function Tests: Recent Labs  Lab 09/15/24 2148  AST 22  ALT 12  ALKPHOS 73  BILITOT 0.5  PROT 5.9*  ALBUMIN  3.2*   CBG: No results for input(s): GLUCAP in the last 168 hours.  Discharge time spent: less than 30 minutes.  Signed: Reyes VEAR Gaw, MD [facility] 09/19/2024

## 2024-09-19 NOTE — Progress Notes (Signed)
 Chaplain responded to spiritual consult request. Family had hoped that we could facilitate notarization of power of attorney documents, however upon arrival, I found that they were neither [facility] sanctioned documents, nor healthcare related (rather, they were property power of attorney). I explained we would be unable to complete these, and recommended that she possibly try contacting a travelling notary. Family expressed their understanding and all questions answered at this time. Chaplains continue to remain available as needed.

## 2024-09-19 NOTE — TOC Transition Note (Signed)
 Transition of Care [facility]) - Discharge Note   Patient Details  Name: Denise Lane MRN: [id number] Date of Birth: [birth year]  Transition of Care [facility]) CM/SW Contact:  Montie LOISE Louder, LCSW Phone Number: 09/19/2024, 3:06 PM   Clinical Narrative:    Patient will Discharge to: [facility]  Discharge Date: 09/19/24 Family Notified:daughter Transport Ab:[id number]  Per MD patient is ready for discharge. RN, patient, and facility notified of discharge. Discharge Summary sent to facility. RN given number for [phone].  Ambulance transport requested for patient.   Clinical Social Worker signing off.  Montie Louder, MSW, LCSW Clinical Social Worker      Final next level of care: Hospice Medical Facility Barriers to Discharge: Barriers Resolved   Patient Goals and CMS Choice            Discharge Placement              Patient chooses bed at:  [facility]) Patient to be transferred to facility by: [facility] Name of family member notified: daughter Patient and family notified of of transfer: 09/19/24  Discharge Plan and Services Additional resources added to the After Visit Summary for   In-house Referral: Clinical Social Work                                   Social Drivers of Health (SDOH) Interventions SDOH Screenings   Food Insecurity: Food Insecurity Present (09/16/2024)  Housing: Low Risk  (09/16/2024)  Transportation Needs: No Transportation Needs (09/16/2024)  Utilities: Not At Risk (09/16/2024)  Alcohol  Screen: Low Risk  (06/20/2018)  Depression (PHQ2-9): Low Risk  (05/02/2024)  Financial Resource Strain: Low Risk  (05/02/2024)  Physical Activity: Inactive (05/02/2024)  Social Connections: Socially Isolated (09/16/2024)  Stress: No Stress Concern Present (05/02/2024)  Tobacco Use: Low Risk  (09/16/2024)     Readmission Risk Interventions    04/25/2024    9:59 AM  Readmission Risk Prevention Plan  Post Dischage Appt Complete  Medication  Screening Complete  Transportation Screening Complete

## 2024-09-19 NOTE — Care Management Important Message (Signed)
 Important Message  Patient Details  Name: Denise Lane MRN: [id number] Date of Birth: July 16, 1930   Important Message Given:  Yes - Medicare IM     Jennie Laneta Dragon 09/19/2024, 2:29 PM

## 2024-09-19 NOTE — Progress Notes (Signed)
 Order to discharge patient to hospice. Handoff given to [facility]. PIV to be left in place per comfort measures and [facility] request. AVS placed in discharge packet. Personal belongings sent with patient. Patient daughter Sharlet aware of discharge and at bedside. [facility] via [facility].

## 2024-09-19 NOTE — Progress Notes (Signed)
 Denise Lane [phone] [facility]  Hospice hospital liaison note   Met with patient and daughter [name] in the room to discuss transferring patient to  [facility] for pain management and end of life care.   [facility] is able to accept patient this afternoon.     RN staff, you may call report at any time to [phone], room is assigned when report is called.  Please leave IV intact and send completed DNR with patient.   Updated attending and [facility] manager via [employer]. Thank you for the opportunity to participate in this patient's care  Amy Darien BSN, RN [facility] Liaison [phone]

## 2024-09-19 NOTE — Plan of Care (Signed)

## 2024-09-19 NOTE — TOC Progression Note (Signed)
 Transition of Care [facility]) - Progression Note    Patient Details  Name: Denise Lane MRN: [id number] Date of Birth: 1930/02/02  Transition of [facility]) CM/SW Contact  Montie LOISE Louder, [place] Phone Number: 09/19/2024, 1:38 PM  Clinical Narrative:     CSW met with patient's daughter,they have moved toward comfort care.   TOC will continue to follow and assist with discharge planning.   Montie Louder, MSW, LCSW Clinical Social Worker    Expected Discharge Plan: Hospice Medical Facility [phone]) Barriers to Discharge: Continued Medical Work up               Expected Discharge Plan and Services In-house Referral: Clinical Social Work     Living arrangements for the past 2 months: Single Family Home                                       Social Drivers of Health (SDOH) Interventions SDOH Screenings   Food Insecurity: Food Insecurity Present (09/16/2024)  Housing: Low Risk  (09/16/2024)  Transportation Needs: No Transportation Needs (09/16/2024)  Utilities: Not At Risk (09/16/2024)  Alcohol  Screen: Low Risk  (06/20/2018)  Depression (PHQ2-9): Low Risk  (05/02/2024)  Financial Resource Strain: Low Risk  (05/02/2024)  Physical Activity: Inactive (05/02/2024)  Social Connections: Socially Isolated (09/16/2024)  Stress: No Stress Concern Present (05/02/2024)  Tobacco Use: Low Risk  (09/16/2024)    Readmission Risk Interventions    04/25/2024    9:59 AM  Readmission Risk Prevention Plan  Post Dischage Appt Complete  Medication Screening Complete  Transportation Screening Complete

## 2024-09-19 NOTE — TOC Initial Note (Signed)
 Transition of Care [facility]) - Initial/Assessment Note    Patient Details  Name: Denise Lane MRN: [id number] Date of Birth: [birth year]  Transition of Care [facility]) CM/SW Contact:    Montie LOISE Louder, LCSW Phone Number: 09/19/2024, 12:16 PM  Clinical Narrative:                  CSW spoke with patient's daughter, Denise Lane by phone. CSW introduced self and explained role. Denise Lane expressed she was uncertain of the patient's disposition. She believes the has declined since yesterday. She confirmed patient is patient of [facility]. She has been trying to get the patient into LTC facility and has been working with an pensions consultant and SW w/ [facility]. Denise Lane is also assisting w/Medicaid application process. She states the patient can not return home - CSW advised insurance will more than likely not cover rehab ( hospice pt) , therefore, if she goes to SNF for  she will have to private pay. She is interested in [facility] and states [facility] SW has been coordinating w/facility. CSW explained the SNF process.   CSW called [facility]- left voice message for admission  CSW called [facility] - left voice message for call back from SW/Denise Lane.   TOC will continue to follow and assist with discharge planning.   Montie Louder, MSW, LCSW Clinical Social Worker     Expected Discharge Plan:  (TBA) Barriers to Discharge: Continued Medical Work up   Patient Goals and CMS Choice            Expected Discharge Plan and Services In-house Referral: Clinical Social Work     Living arrangements for the past 2 months: Single Family Home                                      Prior Living Arrangements/Services Living arrangements for the past 2 months: Single Family Home Lives with:: Self                   Activities of Daily Living   ADL Screening (condition at time of admission) Independently performs ADLs?: Yes (appropriate for developmental age) (Dressing.) Is the  patient deaf or have difficulty hearing?: No Does the patient have difficulty seeing, even when wearing glasses/contacts?: No Does the patient have difficulty concentrating, remembering, or making decisions?: No  Permission Sought/Granted                  Emotional Assessment       Orientation: : Oriented to Self, Oriented to Place, Oriented to Situation Alcohol  / Substance Use: Not Applicable Psych Involvement: No (comment)  Admission diagnosis:  Intracranial bleeding (HCC) [I62.9] Hospice care patient [Z51.5] Fall, initial encounter [W19.XXXA] Closed fracture of one rib of left side, initial encounter [S22.32XA] Severe vascular dementia, unspecified whether behavioral, psychotic, or mood disturbance or anxiety (HCC) [F01.C0] Patient Active Problem List   Diagnosis Date Noted   Intracranial bleeding (HCC) 09/16/2024   Multiple rib fractures 09/16/2024   Vascular dementia (HCC) 09/16/2024   Generalized anxiety disorder 09/16/2024   Complicated UTI (urinary tract infection) 04/24/2024   Nephrolithiasis 04/24/2024   Hypertensive heart disease with heart failure (HCC) 01/05/2020   UTI (urinary tract infection) 07/20/2019   Age-related osteoporosis without current pathological fracture 06/20/2018   Overactive bladder 06/20/2018   GERD (gastroesophageal reflux disease) 06/20/2018   Leg cramps 07/16/2015   Morton's neuroma of left foot 07/16/2015   Right thigh  pain 04/07/2014   Periprosthetic hip fracture 04/07/2014   Osteoarthritis of left hip 03/29/2014   Osteoarthritis 02/08/2014   Neuropathy 02/08/2014   Anxiety and depression 02/08/2014   Urinary incontinence 01/12/2013   Back pain 01/12/2013   Muscle pain    Hyperlipidemia    Essential hypertension    Osteoarthritis of right hip 10/18/2012   Hereditary hemochromatosis 11/29/2011   PCP:  [name], NP Pharmacy:   [facility] - [place], [place] - [address] [place] [address] Phone: [phone] Fax: [phone]  [id number] - [place], [place] - [address] AT [facility] [address] [place] [address] Phone: [phone] Fax: [phone]     Social Drivers of Health (SDOH) Social History: SDOH Screenings   Food Insecurity: Food Insecurity Present (09/16/2024)  Housing: Low Risk  (09/16/2024)  Transportation Needs: No Transportation Needs (09/16/2024)  Utilities: Not At Risk (09/16/2024)  Alcohol  Screen: Low Risk  (06/20/2018)  Depression (PHQ2-9): Low Risk  (05/02/2024)  Financial Resource Strain: Low Risk  (05/02/2024)  Physical Activity: Inactive (05/02/2024)  Social Connections: Socially Isolated (09/16/2024)  Stress: No Stress Concern Present (05/02/2024)  Tobacco Use: Low Risk  (09/16/2024)   SDOH Interventions:     Readmission Risk Interventions    04/25/2024    9:59 AM  Readmission Risk Prevention Plan  Post Dischage Appt Complete  Medication Screening Complete  Transportation Screening Complete

## 2024-09-24 ENCOUNTER — Encounter: Payer: Self-pay | Admitting: Nurse Practitioner

## 2024-10-12 DEATH — deceased
